# Patient Record
Sex: Female | Born: 1940
Health system: Southern US, Community
[De-identification: ages and names within clinical notes are randomized; demographics above are authoritative.]

## PROBLEM LIST (undated history)

## (undated) DIAGNOSIS — E039 Hypothyroidism, unspecified: Secondary | ICD-10-CM

## (undated) DIAGNOSIS — D649 Anemia, unspecified: Secondary | ICD-10-CM

## (undated) DIAGNOSIS — R21 Rash and other nonspecific skin eruption: Secondary | ICD-10-CM

## (undated) DIAGNOSIS — M069 Rheumatoid arthritis, unspecified: Secondary | ICD-10-CM

## (undated) DIAGNOSIS — R12 Heartburn: Secondary | ICD-10-CM

## (undated) DIAGNOSIS — I10 Essential (primary) hypertension: Secondary | ICD-10-CM

## (undated) DIAGNOSIS — K449 Diaphragmatic hernia without obstruction or gangrene: Secondary | ICD-10-CM

## (undated) DIAGNOSIS — E785 Hyperlipidemia, unspecified: Secondary | ICD-10-CM

## (undated) DIAGNOSIS — G43909 Migraine, unspecified, not intractable, without status migrainosus: Secondary | ICD-10-CM

## (undated) DIAGNOSIS — R7303 Prediabetes: Secondary | ICD-10-CM

## (undated) DIAGNOSIS — G250 Essential tremor: Secondary | ICD-10-CM

## (undated) DIAGNOSIS — Z9109 Other allergy status, other than to drugs and biological substances: Secondary | ICD-10-CM

## (undated) HISTORY — DX: Heartburn: R12

## (undated) HISTORY — DX: Other allergy status, other than to drugs and biological substances: Z91.09

## (undated) HISTORY — DX: Rheumatoid arthritis, unspecified: M06.9

## (undated) HISTORY — DX: Prediabetes: R73.03

## (undated) HISTORY — DX: Hypothyroidism, unspecified: E03.9

## (undated) HISTORY — PX: COLONOSCOPY: SHX174

## (undated) HISTORY — DX: Anemia, unspecified: D64.9

## (undated) HISTORY — DX: Essential tremor: G25.0

## (undated) HISTORY — DX: Hyperlipidemia, unspecified: E78.5

## (undated) HISTORY — DX: Migraine, unspecified, not intractable, without status migrainosus: G43.909

## (undated) HISTORY — PX: CATARACT EXTRACTION, BILATERAL: SHX1313

## (undated) HISTORY — PX: TUBAL LIGATION: SHX77

## (undated) HISTORY — DX: Rash and other nonspecific skin eruption: R21

## (undated) HISTORY — DX: Diaphragmatic hernia without obstruction or gangrene: K44.9

## (undated) HISTORY — DX: Essential (primary) hypertension: I10

## (undated) HISTORY — PX: ABDOMINAL HYSTERECTOMY: SHX81

---

## 1999-03-09 ENCOUNTER — Encounter: Payer: Self-pay | Admitting: Internal Medicine

## 1999-03-09 ENCOUNTER — Ambulatory Visit (HOSPITAL_COMMUNITY): Admission: RE | Admit: 1999-03-09 | Discharge: 1999-03-09 | Payer: Self-pay | Admitting: Internal Medicine

## 2000-08-22 ENCOUNTER — Encounter: Payer: Self-pay | Admitting: Internal Medicine

## 2000-08-22 ENCOUNTER — Ambulatory Visit (HOSPITAL_COMMUNITY): Admission: RE | Admit: 2000-08-22 | Discharge: 2000-08-22 | Payer: Self-pay | Admitting: Internal Medicine

## 2000-11-22 ENCOUNTER — Encounter: Payer: Self-pay | Admitting: Internal Medicine

## 2000-11-22 ENCOUNTER — Ambulatory Visit (HOSPITAL_COMMUNITY): Admission: RE | Admit: 2000-11-22 | Discharge: 2000-11-22 | Payer: Self-pay | Admitting: Internal Medicine

## 2001-10-11 ENCOUNTER — Encounter: Payer: Self-pay | Admitting: Internal Medicine

## 2001-10-11 ENCOUNTER — Ambulatory Visit (HOSPITAL_COMMUNITY): Admission: RE | Admit: 2001-10-11 | Discharge: 2001-10-11 | Payer: Self-pay | Admitting: Internal Medicine

## 2002-06-28 ENCOUNTER — Encounter: Payer: Self-pay | Admitting: Rheumatology

## 2002-06-28 ENCOUNTER — Encounter: Admission: RE | Admit: 2002-06-28 | Discharge: 2002-06-28 | Payer: Self-pay | Admitting: Rheumatology

## 2003-02-13 ENCOUNTER — Ambulatory Visit (HOSPITAL_COMMUNITY): Admission: RE | Admit: 2003-02-13 | Discharge: 2003-02-13 | Payer: Self-pay | Admitting: Internal Medicine

## 2003-02-13 ENCOUNTER — Encounter: Payer: Self-pay | Admitting: Internal Medicine

## 2005-08-16 ENCOUNTER — Ambulatory Visit (HOSPITAL_COMMUNITY): Admission: RE | Admit: 2005-08-16 | Discharge: 2005-08-16 | Payer: Self-pay | Admitting: Internal Medicine

## 2007-02-19 ENCOUNTER — Ambulatory Visit (HOSPITAL_COMMUNITY): Admission: RE | Admit: 2007-02-19 | Discharge: 2007-02-19 | Payer: Self-pay | Admitting: Internal Medicine

## 2007-02-28 ENCOUNTER — Ambulatory Visit (HOSPITAL_COMMUNITY): Admission: RE | Admit: 2007-02-28 | Discharge: 2007-02-28 | Payer: Self-pay | Admitting: Internal Medicine

## 2007-07-24 ENCOUNTER — Ambulatory Visit (HOSPITAL_COMMUNITY): Admission: RE | Admit: 2007-07-24 | Discharge: 2007-07-24 | Payer: Self-pay | Admitting: Internal Medicine

## 2008-11-18 ENCOUNTER — Encounter: Payer: Self-pay | Admitting: Gastroenterology

## 2008-12-15 ENCOUNTER — Ambulatory Visit (HOSPITAL_COMMUNITY): Admission: RE | Admit: 2008-12-15 | Discharge: 2008-12-15 | Payer: Self-pay | Admitting: Internal Medicine

## 2009-03-03 ENCOUNTER — Ambulatory Visit: Payer: Self-pay

## 2009-04-03 ENCOUNTER — Encounter: Payer: Self-pay | Admitting: Internal Medicine

## 2009-09-02 ENCOUNTER — Ambulatory Visit (HOSPITAL_COMMUNITY): Admission: RE | Admit: 2009-09-02 | Discharge: 2009-09-02 | Payer: Self-pay | Admitting: Internal Medicine

## 2010-06-09 ENCOUNTER — Ambulatory Visit (HOSPITAL_COMMUNITY): Admission: RE | Admit: 2010-06-09 | Discharge: 2010-06-09 | Payer: Self-pay | Admitting: Internal Medicine

## 2011-04-28 ENCOUNTER — Ambulatory Visit (INDEPENDENT_AMBULATORY_CARE_PROVIDER_SITE_OTHER): Payer: Medicare Other | Admitting: Gastroenterology

## 2011-04-28 ENCOUNTER — Encounter: Payer: Self-pay | Admitting: Gastroenterology

## 2011-04-28 VITALS — BP 130/66 | HR 56 | Ht 64.0 in | Wt 177.8 lb

## 2011-04-28 DIAGNOSIS — R1013 Epigastric pain: Secondary | ICD-10-CM

## 2011-04-28 MED ORDER — OMEPRAZOLE-SODIUM BICARBONATE 40-1100 MG PO CAPS: ORAL_CAPSULE | ORAL | Status: DC

## 2011-04-28 NOTE — Assessment & Plan Note (Signed)
Symptoms may be due to ulcer or nonulcer dyspepsia. I strongly suspect that she is also having nocturnal GERD.  Recommendations #1 DC omeprazole; begin Zegerid 40 mg each bedtime #2 upper endoscopy

## 2011-04-28 NOTE — Patient Instructions (Addendum)
Discontinue omeprazole   Upper GI Endoscopy Upper GI endoscopy means using a flexible scope to look at the esophagus, stomach and upper small bowel. This is done to make a diagnosis in people with heartburn, abdominal pain, or abnormal bleeding. Sometimes an endoscope is needed to remove foreign bodies or food that become stuck in the esophagus; it can also be used to take biopsy samples. For the best results, do not eat or drink for 8 hours before having your upper endoscopy.  To perform the endoscopy, you will probably be sedated and your throat will be numbed with a special spray. The endoscope is then slowly passed down your throat (this will not interfere with your breathing). An endoscopy exam takes 15-30 minutes to complete and there is no real pain. Patients rarely remember much about the procedure. The results of the test may take several days if a biopsy or other test is taken.  You may have a sore throat after an endoscopy exam. Serious complications are very rare. Stick to liquids and soft foods until your pain is better. You should not drive a car or operate any dangerous equipment for at least 24 hours after being sedated. SEEK IMMEDIATE MEDICAL CARE IF:  You have severe throat pain.   You have shortness of breath.   You have bleeding problems.   You have a fever.   You have difficulty recovering from your sedation.  Document Released: 12/15/2004 Document Re-Released: 02/01/2010 [facility] Patient Information 2011 [place], [place]. Your Endoscopy is scheduled on 05/19/2011 at 4pm

## 2011-04-28 NOTE — Progress Notes (Signed)
History of Present Illness:  Dana [name] is a pleasant 70 year old white female referred at the request of Lucky Cowboy, MD evaluation of abdominal pain. Over the past few  months she has been complaining of intermittent upper epigastric pain that she generally experiences upon retiring. She'll often awaken with pain in the area accompanied by excess belching and pyrosis. Symptoms have not improved since starting omeprazole. She has mild dysphagia to pills only. She is on no gastric irritants including nonsteroidals. Endoscopy in 2003 demonstrated gastritis.     Review of Systems: She has hoarseness that she should be severe essential tremor. She complains of mild dizziness. Pertinent positive and negative review of systems were noted in the above HPI section. All other review of systems were otherwise negative.    Current Medications, Allergies, Past Medical History, Past Surgical History, Family History and Social History were reviewed in [employer] electronic medical record  Vital signs were reviewed in today's medical record. Physical Exam: General: Well developed , well nourished, no acute distress Head: Normocephalic and atraumatic Eyes:  sclerae anicteric, EOMI Ears: Normal auditory acuity Mouth: No deformity or lesions Lungs: Clear throughout to auscultation Heart: Regular rate and rhythm; no murmurs, rubs or bruits Abdomen: Soft, non tender and non distended. No masses, hepatosplenomegaly or hernias noted. Normal Bowel sounds Rectal:deferred Musculoskeletal: Symmetrical with no gross deformities  Pulses:  Normal pulses noted Extremities: No clubbing, cyanosis, edema or deformities noted Neurological: Alert oriented x 4, grossly nonfocal Psychological:  Alert and cooperative. Normal mood and affect

## 2011-04-29 ENCOUNTER — Telehealth: Payer: Self-pay | Admitting: Gastroenterology

## 2011-04-29 NOTE — Telephone Encounter (Signed)
Pt called wanted to know if she could purchase OTC Zegerid until her medication from [facility] comes in. Told her that will be fine

## 2011-05-02 ENCOUNTER — Encounter: Payer: Self-pay | Admitting: Gastroenterology

## 2011-05-19 ENCOUNTER — Other Ambulatory Visit: Payer: Medicare Other | Admitting: Gastroenterology

## 2011-05-19 ENCOUNTER — Ambulatory Visit (AMBULATORY_SURGERY_CENTER): Payer: Medicare Other | Admitting: Gastroenterology

## 2011-05-19 ENCOUNTER — Encounter: Payer: Self-pay | Admitting: Gastroenterology

## 2011-05-19 VITALS — BP 125/50 | HR 55 | Temp 98.5°F | Resp 20 | Ht 64.0 in | Wt 177.0 lb

## 2011-05-19 DIAGNOSIS — K2940 Chronic atrophic gastritis without bleeding: Secondary | ICD-10-CM

## 2011-05-19 DIAGNOSIS — R1013 Epigastric pain: Secondary | ICD-10-CM

## 2011-05-19 DIAGNOSIS — K2990 Gastroduodenitis, unspecified, without bleeding: Secondary | ICD-10-CM

## 2011-05-19 DIAGNOSIS — R109 Unspecified abdominal pain: Secondary | ICD-10-CM

## 2011-05-19 MED ORDER — SODIUM CHLORIDE 0.9 % IV SOLN: 500.0000 mL | INTRAVENOUS | Status: DC

## 2011-05-19 NOTE — Patient Instructions (Signed)
Findings:  Erosions  Recommendations:  Continue Zegerid                                   Call office in next 2-3 days to schedule an office appointment for 4-6 weeks

## 2011-05-20 ENCOUNTER — Telehealth: Payer: Self-pay | Admitting: *Deleted

## 2011-05-20 NOTE — Telephone Encounter (Signed)

## 2011-06-24 ENCOUNTER — Encounter: Payer: Self-pay | Admitting: Gastroenterology

## 2011-06-24 ENCOUNTER — Ambulatory Visit (INDEPENDENT_AMBULATORY_CARE_PROVIDER_SITE_OTHER): Payer: Medicare Other | Admitting: Gastroenterology

## 2011-06-24 DIAGNOSIS — R1013 Epigastric pain: Secondary | ICD-10-CM

## 2011-06-24 DIAGNOSIS — K2970 Gastritis, unspecified, without bleeding: Secondary | ICD-10-CM

## 2011-06-24 DIAGNOSIS — R109 Unspecified abdominal pain: Secondary | ICD-10-CM

## 2011-06-24 MED ORDER — HYOSCYAMINE SULFATE 0.125 MG SL SUBL: 0.2500 mg | SUBLINGUAL_TABLET | SUBLINGUAL | Status: AC | PRN

## 2011-06-24 NOTE — Progress Notes (Signed)
History of Present Illness:  Dana [name] has returned following upper endoscopy for evaluation of abdominal pain. Endoscopy demonstrated several gastric erosions. She continues to have episodic epigastric pain consistent of sharp pain without radiation. It is very often is accompanied by nausea and vomiting. Vomiting does relieve the pain. In between she has felt well.  She takes  tramadol on a regular basis and rarely takes tetracycline for rosacea.  She remains on Zegerid.    Review of Systems: Pertinent positive and negative review of systems were noted in the above HPI section. All other review of systems were otherwise negative.    Current Medications, Allergies, Past Medical History, Past Surgical History, Family History and Social History were reviewed in [employer] electronic medical record  Vital signs were reviewed in today's medical record. Physical Exam: General: Well developed , well nourished, no acute distress Abdomen is without masses, tenderness organomegaly

## 2011-06-24 NOTE — Patient Instructions (Signed)
Your abdominal ultrasound is scheduled at [facility] on 06/28/2011 at 8am you will need to arrive at 7:45am Nothing to eat or drink after midnight We will obtain your labs from Dr Michaelle Birks office

## 2011-06-24 NOTE — Assessment & Plan Note (Addendum)
She continues to have intermittent pain despite therapy with PPIs.  Pain is associated with nausea and slightly relieved with vomiting. I think it is unlikely that this is due to peptic ulcer disease. Chronic cholecystitis, less likely obstruction are other considerations.  Recommendations #1 check previous lab work from her PCP #2 abdominal ultrasound #3 hyomax as needed for pain

## 2011-06-24 NOTE — Assessment & Plan Note (Signed)
Plan to continue Zegerid

## 2011-06-28 ENCOUNTER — Ambulatory Visit (HOSPITAL_COMMUNITY)
Admission: RE | Admit: 2011-06-28 | Discharge: 2011-06-28 | Disposition: A | Discharge status: Home / Self Care | Payer: Medicare Other | Source: Ambulatory Visit | Attending: Gastroenterology | Admitting: Gastroenterology

## 2011-06-28 DIAGNOSIS — K8020 Calculus of gallbladder without cholecystitis without obstruction: Secondary | ICD-10-CM | POA: Insufficient documentation

## 2011-06-28 DIAGNOSIS — R109 Unspecified abdominal pain: Secondary | ICD-10-CM | POA: Insufficient documentation

## 2011-06-29 ENCOUNTER — Telehealth: Payer: Self-pay | Admitting: *Deleted

## 2011-06-29 MED ORDER — GLYCOPYRROLATE 2 MG PO TABS: 2.0000 mg | ORAL_TABLET | Freq: Three times a day (TID) | ORAL | Status: DC

## 2011-06-29 NOTE — Telephone Encounter (Signed)
FYI- Dr Arlyce Dice,  Insurance will not pay for Hyoscyamine, I sent in for her Robinul Forte

## 2011-06-30 ENCOUNTER — Telehealth: Payer: Self-pay

## 2011-06-30 NOTE — Telephone Encounter (Signed)
Pt aware of appt date and time

## 2011-06-30 NOTE — Telephone Encounter (Signed)
Message copied by Michele Mcalpine on Thu Jun 30, 2011  9:22 AM ------      Message from: Melvia Heaps MD D      Created: Wed Jun 29, 2011 12:35 PM       Tell pt that gb stones were sen at ultrasound.      Refer pt to the newest associate at [facility] along with, my office notes.

## 2011-06-30 NOTE — Telephone Encounter (Signed)
Pt scheduled to see Dr. Biagio Quint with [facility] 07/13/11 arrival time 1pm, Appt time 1:30pm. Left message for pt to call back.

## 2011-07-05 ENCOUNTER — Encounter: Payer: Self-pay | Admitting: *Deleted

## 2011-07-05 NOTE — Telephone Encounter (Signed)
ok 

## 2011-07-05 NOTE — Telephone Encounter (Signed)
error 

## 2011-07-13 ENCOUNTER — Ambulatory Visit (INDEPENDENT_AMBULATORY_CARE_PROVIDER_SITE_OTHER): Payer: Medicare Other | Admitting: General Surgery

## 2011-07-13 VITALS — BP 122/76 | HR 66 | Temp 98.2°F | Resp 16 | Ht 64.0 in | Wt 173.4 lb

## 2011-07-13 DIAGNOSIS — Z01818 Encounter for other preprocedural examination: Secondary | ICD-10-CM

## 2011-07-13 DIAGNOSIS — K8020 Calculus of gallbladder without cholecystitis without obstruction: Secondary | ICD-10-CM

## 2011-07-13 NOTE — Progress Notes (Signed)
Chief Complaint  Patient presents with  . Abdominal Pain    Gallstones    HPI Dana [name] is a 70 y.o. female.  This patient was referred by Dr. Arlyce Dice for evaluation of cholelithiasis. She has complaints of 2-3 months of epigastric pain and some occasional right-sided back pain which she describes feels "like a rock in her stomach". She has associated burping and some nausea and vomiting. She states that the symptoms are worse with eating especially spicy foods. She has not noticed any fatty food intolerance. She has episodes approximately every few weeks which began with pain and admits to nausea and vomiting. Her last episode was approximately 2 weeks ago. She has some chills but denies any fevers she denies any hematochezia or melena. She had a normal colonoscopy proximally 8 years ago. She also had a recent EGD which demonstrated some gastric ulcerations but has been treated with omeprazole for history of chronic GERD. She states that despite taking the PPIs her symptoms persist in the medication makes no difference. She had an ultrasound which demonstrated cholelithiasis without evidence of cholecystitis. She does have a history of peptic ulcer disease in the 1980s. HPI  Past Medical History  Diagnosis Date  . Rheumatoid arthritis   . Tremor, essential     right hand  . Environmental allergies   . Hypothyroidism   . Migraines   . Hyperlipidemia   . Heartburn   . Anemia   . Hiatal hernia   . Gastric ulcer   . Cataract     Past Surgical History  Procedure Date  . Abdominal hysterectomy   . Tubal ligation   . Cataract extraction, bilateral     Family History  Problem Relation Age of Onset  . Colon cancer Neg Hx   . Heart disease Mother   . Diabetes Mother   . Heart disease Maternal Aunt     x3   . Heart disease Maternal Uncle     x 4  . Diabetes Sister     borderline    Social History History  Substance Use Topics  . Smoking status: Former Games developer  . Smokeless  tobacco: Never Used   Comment: stopped around 25 years ago  . Alcohol Use: No    Allergies  Allergen Reactions  . Codeine   . Mysoline (Primidone)     Current Outpatient Prescriptions  Medication Sig Dispense Refill  . butalbital-acetaminophen-caffeine (FIORICET) 50-325-40 MG per tablet Take 2 tablets by mouth 2 (two) times daily as needed.        . Cholecalciferol (VITAMIN D) 2000 UNITS CAPS Take by mouth. Take 4 capsules by mouth once daily       . Cyanocobalamin 500 MCG/0.1ML SOLN Inject 1 mL into the skin every 30 (thirty) days.        . diazepam (VALIUM) 2 MG tablet Take 2 mg by mouth every 6 (six) hours as needed.        . doxycycline (VIBRAMYCIN) 100 MG capsule Take 100 mg by mouth 2 (two) times daily.        Marland Kitchen estradiol (ESTRACE) 1 MG tablet Take 1 mg by mouth daily.        . folic acid (FOLVITE) 800 MCG tablet Take 400 mcg by mouth daily.        . furosemide (LASIX) 40 MG tablet Take 40 mg by mouth daily.        . hydroxychloroquine (PLAQUENIL) 200 MG tablet Take 2 tablets by mouth once daily       .  levothyroxine (SYNTHROID, LEVOTHROID) 100 MCG tablet Take 100 mcg by mouth daily.        Marland Kitchen loratadine (CLARITIN) 10 MG tablet Take 10 mg by mouth daily.        . methotrexate (RHEUMATREX) 2.5 MG tablet Take 5 tablets by mouth on Tuesday and 5 tablets by mouth on Wednesday       . omeprazole (PRILOSEC) 20 MG capsule       . omeprazole-sodium bicarbonate (ZEGERID) 40-1100 MG per capsule Take one capsule at bedtime  30 capsule  2  . pravastatin (PRAVACHOL) 40 MG tablet Take 40 mg by mouth daily.        . propranolol (INDERAL LA) 120 MG 24 hr capsule Take 120 mg by mouth daily.        . traMADol (ULTRAM) 50 MG tablet Take 50 mg by mouth 2 (two) times daily as needed.        . verapamil (CALAN-SR) 180 MG CR tablet Take 180 mg by mouth at bedtime.        Marland Kitchen ZOSTAVAX 04540 UNT/0.65ML injection       . estradiol (ESTRACE) 2 MG tablet       . glycopyrrolate (ROBINUL-FORTE) 2 MG tablet  Take 1 tablet (2 mg total) by mouth 3 (three) times daily.  30 tablet  2    Review of Systems Review of Systems  Constitutional: Positive for chills. Negative for fever and weight loss.  HENT: Negative for congestion and sore throat.   Eyes: Positive for blurred vision and double vision. Negative for pain and redness.  Respiratory: Negative.  Negative for stridor.   Cardiovascular: Positive for chest pain. Negative for palpitations and leg swelling.  Gastrointestinal: Positive for nausea, vomiting and abdominal pain. Negative for blood in stool and melena.  Genitourinary: Negative.   Musculoskeletal: Positive for myalgias and back pain.  Skin: Negative.   Neurological: Positive for headaches.  Endo/Heme/Allergies: Negative.   Psychiatric/Behavioral: Negative.     Blood pressure 122/76, pulse 66, temperature 98.2 F (36.8 C), temperature source Temporal, resp. rate 16, height 5\' 4"  (1.626 m), weight 173 lb 6.4 oz (78.654 kg).  Physical Exam Physical Exam  Constitutional: She is oriented to person, place, and time. She appears well-developed and well-nourished. No distress.  HENT:  Head: Normocephalic and atraumatic.  Eyes: Conjunctivae are normal. Pupils are equal, round, and reactive to light. Right eye exhibits no discharge. Left eye exhibits no discharge. No scleral icterus.  Neck: Normal range of motion. Neck supple. No tracheal deviation present.  Cardiovascular: Normal rate, regular rhythm and normal heart sounds.   Respiratory: Effort normal and breath sounds normal. No stridor. No respiratory distress. She has no wheezes.  GI: Soft. Bowel sounds are normal. She exhibits no distension and no mass. There is no tenderness. There is no rebound and no guarding.  Musculoskeletal: Normal range of motion. She exhibits no edema and no tenderness.  Neurological: She is alert and oriented to person, place, and time.  Skin: Skin is warm and dry. No rash noted. She is not diaphoretic. No  erythema. No pallor.  Psychiatric: She has a normal mood and affect. Her behavior is normal. Judgment and thought content normal.     Data Reviewed   Assessment    Epigastric abdominal pain and cholelithiasis. This certainly could be related to her cholelithiasis but she also has a history of peptic ulcer disease. She had a recent endoscopy which showed some gastric ulcerations but she was referred by her  gastroenterologist for cholecystectomy because he feels that this is most likely due to her cholelithiasis. I agree that her symptoms certainly could be related to her gallbladder and I discussed the options for cholecystectomy with her. I also discussed the risk that if we perform cholecystectomy that her greatest risk would be that she has persistence of her symptoms and that this was actually from another cause because her symptoms are not as clear-cut as typical. We discussed the other risks of surgery including infection, bleeding, pain, scarring, persistent symptoms, injury to bowel or bile ducts, diarrhea and she expressed understanding. We will plan to have her see a cardiologist for cardiac clearance in preparation for surgery, and if she desires to proceed with cholecystectomy we will do this at her convenience.    Plan    Cardiology evaluation and clearance  She will come back if she desires to pursue cholecystectomy.       Lodema Pilot DAVID 07/13/2011, 2:26 PM

## 2011-08-22 DIAGNOSIS — R21 Rash and other nonspecific skin eruption: Secondary | ICD-10-CM

## 2011-08-22 HISTORY — DX: Rash and other nonspecific skin eruption: R21

## 2011-08-29 ENCOUNTER — Other Ambulatory Visit (INDEPENDENT_AMBULATORY_CARE_PROVIDER_SITE_OTHER): Payer: Self-pay | Admitting: General Surgery

## 2011-08-29 ENCOUNTER — Encounter (HOSPITAL_COMMUNITY)
Admission: RE | Admit: 2011-08-29 | Discharge: 2011-08-29 | Disposition: A | Discharge status: Home / Self Care | Payer: Medicare Other | Source: Ambulatory Visit | Attending: General Surgery | Admitting: General Surgery

## 2011-08-29 DIAGNOSIS — I10 Essential (primary) hypertension: Secondary | ICD-10-CM

## 2011-08-29 LAB — COMPREHENSIVE METABOLIC PANEL
ALT: 10 U/L (ref 0–35)
AST: 16 U/L (ref 0–37)
Albumin: 3.3 g/dL — ABNORMAL LOW (ref 3.5–5.2)
Alkaline Phosphatase: 90 U/L (ref 39–117)
CO2: 31 mEq/L (ref 19–32)
Chloride: 103 mEq/L (ref 96–112)
GFR calc non Af Amer: 85 mL/min — ABNORMAL LOW (ref >90)
Potassium: 4.2 mEq/L (ref 3.5–5.1)
Sodium: 140 mEq/L (ref 135–145)
Total Bilirubin: 0.3 mg/dL (ref 0.3–1.2)

## 2011-08-29 LAB — CBC
MCV: 93.5 fL (ref 78.0–100.0)
Platelets: 293 10*3/uL (ref 150–400)
RBC: 3.83 MIL/uL — ABNORMAL LOW (ref 3.87–5.11)
RDW: 14.7 % (ref 11.5–15.5)
WBC: 7.3 10*3/uL (ref 4.0–10.5)

## 2011-08-29 LAB — DIFFERENTIAL
Basophils Absolute: 0 10*3/uL (ref 0.0–0.1)
Eosinophils Absolute: 0.1 10*3/uL (ref 0.0–0.7)
Eosinophils Relative: 2 % (ref 0–5)
Lymphs Abs: 2.3 10*3/uL (ref 0.7–4.0)
Neutrophils Relative %: 57 % (ref 43–77)

## 2011-08-29 LAB — SURGICAL PCR SCREEN: Staphylococcus aureus: NEGATIVE

## 2011-08-31 ENCOUNTER — Ambulatory Visit (HOSPITAL_COMMUNITY): Payer: Medicare Other

## 2011-08-31 ENCOUNTER — Ambulatory Visit (HOSPITAL_COMMUNITY)
Admission: RE | Admit: 2011-08-31 | Discharge: 2011-08-31 | Disposition: A | Discharge status: Home / Self Care | Payer: Medicare Other | Source: Ambulatory Visit | Attending: General Surgery | Admitting: General Surgery

## 2011-08-31 ENCOUNTER — Other Ambulatory Visit (INDEPENDENT_AMBULATORY_CARE_PROVIDER_SITE_OTHER): Payer: Self-pay | Admitting: General Surgery

## 2011-08-31 DIAGNOSIS — E039 Hypothyroidism, unspecified: Secondary | ICD-10-CM | POA: Insufficient documentation

## 2011-08-31 DIAGNOSIS — M129 Arthropathy, unspecified: Secondary | ICD-10-CM | POA: Insufficient documentation

## 2011-08-31 DIAGNOSIS — K8020 Calculus of gallbladder without cholecystitis without obstruction: Secondary | ICD-10-CM | POA: Insufficient documentation

## 2011-08-31 DIAGNOSIS — Z01812 Encounter for preprocedural laboratory examination: Secondary | ICD-10-CM | POA: Insufficient documentation

## 2011-08-31 DIAGNOSIS — Z87891 Personal history of nicotine dependence: Secondary | ICD-10-CM | POA: Insufficient documentation

## 2011-08-31 DIAGNOSIS — K811 Chronic cholecystitis: Secondary | ICD-10-CM

## 2011-08-31 DIAGNOSIS — K219 Gastro-esophageal reflux disease without esophagitis: Secondary | ICD-10-CM | POA: Insufficient documentation

## 2011-08-31 DIAGNOSIS — Z01818 Encounter for other preprocedural examination: Secondary | ICD-10-CM | POA: Insufficient documentation

## 2011-08-31 DIAGNOSIS — R7309 Other abnormal glucose: Secondary | ICD-10-CM | POA: Insufficient documentation

## 2011-08-31 HISTORY — PX: CHOLECYSTECTOMY: SHX55

## 2011-09-02 NOTE — Op Note (Signed)
Dana [name], Dana [name] NO.:  [id number]  MEDICAL RECORD NO.:  [id number]  LOCATION:  [facility]                         FACILITY:  [facility]  PHYSICIAN:  Lodema Pilot, MD       DATE OF BIRTH:  [birth year]  DATE OF PROCEDURE:  08/31/2011 DATE OF DISCHARGE:                              OPERATIVE REPORT   PROCEDURE:  Laparoscopic cholecystectomy with intraoperative cholangiogram.  PREOPERATIVE DIAGNOSIS:  Symptomatic cholelithiasis.  POSTOPERATIVE DIAGNOSIS:  Symptomatic cholelithiasis.  SURGEON:  Lodema Pilot, MD  ASSISTANT:  None.  ANESTHESIA:  General endotracheal anesthesia with 17 mL of 1% lidocaine with epinephrine and 0.25% Marcaine in a 50:50 mixture.  FLUIDS:  1400 mL of crystalloid.  ESTIMATED BLOOD LOSS:  Minimal.  DRAINS:  None.  SPECIMENS:  Gallbladder and contents sent to Pathology for permanent sectioning.  COMPLICATIONS:  None apparent.  FINDINGS:  Normal intraoperative cholangiogram.  Some adhesions to the hemicolon to the lateral abdominal wall, which were taken down with sharp dissection.  She also had a few adhesions from the liver to the abdominal wall, which was taken down with sharp dissection.  INDICATION FOR PROCEDURE:  Dana [name] is a 70 year old female with upper abdominal discomfort after eating and she has gallstones on ultrasound. She has been followed by Dr. Arlyce Dice and she was referred by him for evaluation for cholecystectomy.  She also has a history of peptic ulcer disease, but she has been on PPIs and has persistent symptoms despite PPI treatment.  I discussed with her preoperatively the risks of the procedure, including the main risk for persistent symptoms postoperatively and diarrhea, and she expressed understanding and desired to proceed with planned procedure.  OPERATIVE DETAILS:  Dana [name] was seen and evaluated in the preoperative area and risks and benefits of the procedure were discussed in lay terms.  Informed  consent was obtained and prophylactic antibiotics were given.  She was taken to the operating room, placed on the table in a supine position, and general endotracheal anesthesia was obtained.  Her abdomen was prepped and draped in a standard surgical fashion and a semicircular infraumbilical incision was made in the skin and dissection carried down to subcutaneous tissue with blunt dissection.  The abdominal wall fascia was elevated and sharply incised and the peritoneum was entered under direct visualization.  Interrupted 0-Vicryl sutures were placed at the umbilicus and a 12-mm Hasson port was placed.  Pneumoperitoneum was obtained and the laparoscope was introduced and there was no evidence of bowel injury upon entry.  She had normal-appearing anatomy, although she did have some adhesions from the liver to the anterior abdominal wall, and colon was adhered to the lateral abdominal wall.  An 11-mm epigastric port was placed under direct visualization and the adhesions from the colon to the lateral abdominal wall were taken down with sharp dissection.  There were thin filmy adhesions and the colon was not closely adherent so placement of a 5-mm trocar in this area under direct visualization and another right upper quadrant 5-mm trocar was placed under direct visualization.  The adhesions from the liver to the abdominal wall were taken down with sharp dissection as well, and the  gallbladder fundus was retracted cephalad.  The peritoneum was taken down off the gallbladder and the triangle of Calot was skeletonized.  The artery was identified in its usual anatomic position coursing along the medial aspect of the gallbladder adjacent to the lymph node of Calot.  Clips were placed on the artery, but it was not divided at this time.  The cystic duct was skeletonized and the triangle of Calot was opened up and the critical view of safety was obtained visualizing the liver and parenchyma  through the triangle of Calot with a single cystic duct coursing on the gallbladder and a single cystic artery coursing through the triangle of Calot.  Clip was placed on the gallbladder and a small cystic ductotomy was made and cholangiogram was performed, which demonstrated a long cystic duct and normal filling of the common bile duct and right and left hepatic ducts.  There were no filling defects, and there was free flow of contrast in the duodenum.  The catheter was removed and clips were placed on the cystic duct and the cystic duct was transected and the already clipped.  Cystic artery was transected at this time.  She had a long mesentery on the gallbladder and the gallbladder was taken off the gallbladder fossa using Bovie electrocautery and the gallbladder was not entered during the dissection.  There was no spillage of bile during the dissection and the gallbladder was completely removed and placed in an EndoCatch bag and removed through the umbilicus and passed off the table and sent to Pathology for permanent sectioning.  The gallbladder fossa was then irrigated and inspected for hemostasis. Hemostasis was obtained with Bovie electrocautery and clips were again noted in good position.  The right upper quadrant was suctioned from any fluid and the right upper quadrant trocar was removed under direct visualization.  The abdominal wall was noted to be hemostatic and then the additional sutures were placed to close the umbilical fascia with interrupted 0-Vicryl sutures.  These sutures were secured and the abdomen was reinsufflated through the epigastric trocar and the laparoscope introduced.  The umbilical closure was noted be adequate, there was no evidence of bowel injury, and the right upper quadrant appeared hemostatic with no evidence of bowel injury.  The epigastric trocar was removed and the skin was washed and dried, and a total of 17 mL of local were injected at the port  sites and the skin edges were approximated with 4-0 Monocryl subcuticular suture.  Skin was washed and dried and Dermabond was applied.  All sponge, needle, and instrument counts were correct at the end of the case.  The patient tolerated the procedure well without apparent complications.          ______________________________ Lodema Pilot, MD     BL/MEDQ  D:  08/31/2011  T:  08/31/2011  Job:  [id number]  Electronically Signed by Lodema Pilot DO on 09/02/2011 12:15:52 AM

## 2011-09-07 ENCOUNTER — Encounter (INDEPENDENT_AMBULATORY_CARE_PROVIDER_SITE_OTHER): Payer: Self-pay | Admitting: General Surgery

## 2011-09-07 ENCOUNTER — Ambulatory Visit (INDEPENDENT_AMBULATORY_CARE_PROVIDER_SITE_OTHER): Payer: Medicare Other | Admitting: General Surgery

## 2011-09-07 VITALS — BP 124/68 | HR 60 | Temp 96.3°F | Resp 16 | Ht 64.0 in | Wt 166.4 lb

## 2011-09-07 DIAGNOSIS — Z5189 Encounter for other specified aftercare: Secondary | ICD-10-CM

## 2011-09-07 DIAGNOSIS — Z4889 Encounter for other specified surgical aftercare: Secondary | ICD-10-CM

## 2011-09-07 DIAGNOSIS — R21 Rash and other nonspecific skin eruption: Secondary | ICD-10-CM

## 2011-09-07 NOTE — Progress Notes (Signed)
Subjective:     Patient ID: Dana [name], female   DOB: 05/24/1941, 70 y.o.   MRN: [id number]  HPI This patient follows up approximately one week status post laparoscopic cholecystectomy. She comes in today early for her postoperative visit due to a rash that has come up on her upper abdomen and breast region since Saturday. This first started approximately 2-3 days after surgery but has been asymptomatic. She's not had any fevers and she is off pain medication stating that her pain continues to improve daily. She is tolerating diet and her bowels are functioning. She denies any itching.  Review of Systems     Objective:   Physical Exam No acute distress and nontoxic-appearing sitting correctly in the bed  Her abdomen is soft and nontender on exam her incisions are healing well without sign of infection. She has a bandlike rash across her upper abdomen and lower breast region with some extension into the crease of her right breast this is maculopapular with some small pustules.    Assessment:     Status post cholecystectomy doing well her pathology benign  Maculopapular rash which is likely due to an allergic contact dermatitis from adhesives. This is in a bandlike distribution across her chestwhere the [facility] was present. This is currently asymptomatic and nonpainful and not itchy. At the current time I recommended continued observation and topical medications if this becomes symptomatic however I think this should improve with time. If this does not improve shortly, we will get her in to see her dermatologist Dr. Margo Aye.    Plan:

## 2011-09-29 ENCOUNTER — Encounter (INDEPENDENT_AMBULATORY_CARE_PROVIDER_SITE_OTHER): Payer: Medicare Other | Admitting: General Surgery

## 2011-10-04 ENCOUNTER — Encounter (INDEPENDENT_AMBULATORY_CARE_PROVIDER_SITE_OTHER): Payer: Self-pay | Admitting: General Surgery

## 2012-08-13 ENCOUNTER — Encounter: Payer: Self-pay | Admitting: Gastroenterology

## 2012-09-18 ENCOUNTER — Ambulatory Visit (AMBULATORY_SURGERY_CENTER): Payer: Medicare Other

## 2012-09-18 VITALS — Ht 63.5 in | Wt 163.5 lb

## 2012-09-18 DIAGNOSIS — Z1211 Encounter for screening for malignant neoplasm of colon: Secondary | ICD-10-CM

## 2012-09-18 MED ORDER — NA SULFATE-K SULFATE-MG SULF 17.5-3.13-1.6 GM/177ML PO SOLN: 1.0000 | Freq: Once | ORAL | Status: DC

## 2012-10-02 ENCOUNTER — Encounter: Payer: Self-pay | Admitting: Gastroenterology

## 2012-10-02 ENCOUNTER — Ambulatory Visit (AMBULATORY_SURGERY_CENTER): Payer: Medicare Other | Admitting: Gastroenterology

## 2012-10-02 VITALS — BP 131/69 | HR 55 | Temp 97.0°F | Resp 20 | Ht 63.5 in | Wt 163.0 lb

## 2012-10-02 DIAGNOSIS — Z1211 Encounter for screening for malignant neoplasm of colon: Secondary | ICD-10-CM

## 2012-10-02 DIAGNOSIS — K5730 Diverticulosis of large intestine without perforation or abscess without bleeding: Secondary | ICD-10-CM

## 2012-10-02 MED ORDER — SODIUM CHLORIDE 0.9 % IV SOLN: 500.0000 mL | INTRAVENOUS | Status: DC

## 2012-10-02 NOTE — Progress Notes (Signed)
Pressure applied to the abdomen to reach the cecum 

## 2012-10-02 NOTE — Progress Notes (Signed)
Patient did not experience any of the following events: a burn prior to discharge; a fall within the facility; wrong site/side/patient/procedure/implant event; or a hospital transfer or hospital admission upon discharge from the facility. (G8907) Patient did not have preoperative order for IV antibiotic SSI prophylaxis. (G8918)  

## 2012-10-02 NOTE — Progress Notes (Signed)
1035 Unable to expel air.  See meds/Levsin given.

## 2012-10-02 NOTE — Op Note (Signed)
[facility] [address] [place], [address]   COLONOSCOPY PROCEDURE REPORT  PATIENT: Dana [name], Dana [name]  MR#: [id number] BIRTHDATE: [birth year] , 71  yrs. old GENDER: Female ENDOSCOPIST: Louis Meckel, MD REFERRED [id number] Oneta Rack, M.D. PROCEDURE DATE:  10/02/2012 PROCEDURE:   Colonoscopy, diagnostic ASA CLASS:   Class II INDICATIONS: MEDICATIONS: MAC sedation, administered by CRNA and propofol (Diprivan) 200mg  IV  DESCRIPTION OF PROCEDURE:   After the risks benefits and alternatives of the procedure were thoroughly explained, informed consent was obtained.  A digital rectal exam revealed no abnormalities of the rectum.   The LB [id number]  endoscope was introduced through the anus and advanced to the cecum, which was identified by both the appendix and ileocecal valve. No adverse events experienced.   The quality of the prep was Suprep excellent The instrument was then slowly withdrawn as the colon was fully examined.      COLON FINDINGS: Mild diverticulosis was noted in the sigmoid colon. Retroflexed views revealed no abnormalities. The time to cecum=5 minutes 0 seconds.  Withdrawal time=6 minutes 56 seconds.  The scope was withdrawn and the procedure completed. COMPLICATIONS: There were no complications.  ENDOSCOPIC IMPRESSION: Mild diverticulosis was noted in the sigmoid colon  RECOMMENDATIONS: Continue current colorectal screening recommendations for "routine risk" patients with a repeat colonoscopy in 10 years.   eSigned:  Louis Meckel, MD 10/02/2012 10:12 AM   cc:

## 2012-10-02 NOTE — Patient Instructions (Addendum)
YOU HAD AN ENDOSCOPIC PROCEDURE TODAY AT THE [facility]: Refer to the procedure report that was given to you for any specific questions about what was found during the examination.  If the procedure report does not answer your questions, please call your gastroenterologist to clarify.  If you requested that your care partner not be given the details of your procedure findings, then the procedure report has been included in a sealed envelope for you to review at your convenience later.  YOU SHOULD EXPECT: Some feelings of bloating in the abdomen. Passage of more gas than usual.  Walking can help get rid of the air that was put into your GI tract during the procedure and reduce the bloating. If you had a lower endoscopy (such as a colonoscopy or flexible sigmoidoscopy) you may notice spotting of blood in your stool or on the toilet paper. If you underwent a bowel prep for your procedure, then you may not have a normal bowel movement for a few days.  DIET: Your first meal following the procedure should be a light meal and then it is ok to progress to your normal diet.  A half-sandwich or bowl of soup is an example of a good first meal.  Heavy or fried foods are harder to digest and may make you feel nauseous or bloated.  Likewise meals heavy in dairy and vegetables can cause extra gas to form and this can also increase the bloating.  Drink plenty of fluids but you should avoid alcoholic beverages for 24 hours.  ACTIVITY: Your care partner should take you home directly after the procedure.  You should plan to take it easy, moving slowly for the rest of the day.  You can resume normal activity the day after the procedure however you should NOT DRIVE or use heavy machinery for 24 hours (because of the sedation medicines used during the test).    SYMPTOMS TO REPORT IMMEDIATELY: A gastroenterologist can be reached at any hour.  During normal business hours, 8:30 AM to 5:00 PM Monday through Friday,  call [phone].  After hours and on weekends, please call the GI answering service at [phone] who will take a message and have the physician on call contact you.   Following lower endoscopy (colonoscopy or flexible sigmoidoscopy):  Excessive amounts of blood in the stool  Significant tenderness or worsening of abdominal pains  Swelling of the abdomen that is new, acute  Fever of 100F or higher  Following upper endoscopy (EGD)  Vomiting of blood or coffee ground material  New chest pain or pain under the shoulder blades  Painful or persistently difficult swallowing  New shortness of breath  Fever of 100F or higher  Black, tarry-looking stools  FOLLOW UP: If any biopsies were taken you will be contacted by phone or by letter within the next 1-3 weeks.  Call your gastroenterologist if you have not heard about the biopsies in 3 weeks.  Our staff will call the home number listed on your records the next business day following your procedure to check on you and address any questions or concerns that you may have at that time regarding the information given to you following your procedure. This is a courtesy call and so if there is no answer at the home number and we have not heard from you through the emergency physician on call, we will assume that you have returned to your regular daily activities without incident.  SIGNATURES/CONFIDENTIALITY: You and/or your care   partner have signed paperwork which will be entered into your electronic medical record.  These signatures attest to the fact that that the information above on your After Visit Summary has been reviewed and is understood.  Full responsibility of the confidentiality of this discharge information lies with you and/or your care-partner.  

## 2012-10-03 ENCOUNTER — Telehealth: Payer: Self-pay | Admitting: *Deleted

## 2012-10-03 NOTE — Telephone Encounter (Signed)
  Follow up Call-  Call back number 10/02/2012 05/19/2011  Post procedure Call Back phone  # [phone]  Permission to leave phone message Yes -     Patient questions:  Do you have a fever, pain , or abdominal swelling? no Pain Score  0 *  Have you tolerated food without any problems? yes  Have you been able to return to your normal activities? yes  Do you have any questions about your discharge instructions: Diet   no Medications  no Follow up visit  no  Do you have questions or concerns about your Care? no  Actions: * If pain score is 4 or above: No action needed, pain <4.

## 2013-02-14 ENCOUNTER — Other Ambulatory Visit (HOSPITAL_COMMUNITY): Payer: Self-pay | Admitting: Internal Medicine

## 2013-02-14 DIAGNOSIS — Z780 Asymptomatic menopausal state: Secondary | ICD-10-CM

## 2013-02-14 DIAGNOSIS — Z1231 Encounter for screening mammogram for malignant neoplasm of breast: Secondary | ICD-10-CM

## 2013-02-20 ENCOUNTER — Ambulatory Visit (HOSPITAL_COMMUNITY)
Admission: RE | Admit: 2013-02-20 | Discharge: 2013-02-20 | Disposition: A | Discharge status: Home / Self Care | Payer: Medicare Other | Source: Ambulatory Visit | Attending: Internal Medicine | Admitting: Internal Medicine

## 2013-02-20 DIAGNOSIS — Z1231 Encounter for screening mammogram for malignant neoplasm of breast: Secondary | ICD-10-CM

## 2013-02-20 DIAGNOSIS — Z13820 Encounter for screening for osteoporosis: Secondary | ICD-10-CM | POA: Insufficient documentation

## 2013-02-20 DIAGNOSIS — Z780 Asymptomatic menopausal state: Secondary | ICD-10-CM

## 2013-07-09 ENCOUNTER — Other Ambulatory Visit: Payer: Self-pay | Admitting: Internal Medicine

## 2013-09-12 ENCOUNTER — Ambulatory Visit (HOSPITAL_COMMUNITY)
Admission: RE | Admit: 2013-09-12 | Discharge: 2013-09-12 | Disposition: A | Discharge status: Home / Self Care | Payer: Medicare Other | Source: Ambulatory Visit | Attending: Emergency Medicine | Admitting: Emergency Medicine

## 2013-09-12 ENCOUNTER — Other Ambulatory Visit (HOSPITAL_COMMUNITY): Payer: Self-pay | Admitting: Emergency Medicine

## 2013-09-12 DIAGNOSIS — M4120 Other idiopathic scoliosis, site unspecified: Secondary | ICD-10-CM | POA: Insufficient documentation

## 2013-09-12 DIAGNOSIS — R071 Chest pain on breathing: Secondary | ICD-10-CM

## 2013-09-12 DIAGNOSIS — R079 Chest pain, unspecified: Secondary | ICD-10-CM | POA: Insufficient documentation

## 2013-10-07 ENCOUNTER — Other Ambulatory Visit: Payer: Self-pay | Admitting: Emergency Medicine

## 2013-10-07 MED ORDER — LEVOTHYROXINE SODIUM 100 MCG PO TABS: 100.0000 ug | ORAL_TABLET | Freq: Every day | ORAL | Status: DC

## 2013-10-10 ENCOUNTER — Other Ambulatory Visit: Payer: Self-pay

## 2013-12-31 ENCOUNTER — Encounter: Payer: Self-pay | Admitting: Cardiology

## 2013-12-31 DIAGNOSIS — E782 Mixed hyperlipidemia: Secondary | ICD-10-CM | POA: Insufficient documentation

## 2013-12-31 DIAGNOSIS — E039 Hypothyroidism, unspecified: Secondary | ICD-10-CM | POA: Insufficient documentation

## 2013-12-31 DIAGNOSIS — G43909 Migraine, unspecified, not intractable, without status migrainosus: Secondary | ICD-10-CM | POA: Insufficient documentation

## 2013-12-31 DIAGNOSIS — G250 Essential tremor: Secondary | ICD-10-CM | POA: Insufficient documentation

## 2013-12-31 DIAGNOSIS — D649 Anemia, unspecified: Secondary | ICD-10-CM | POA: Insufficient documentation

## 2013-12-31 DIAGNOSIS — M069 Rheumatoid arthritis, unspecified: Secondary | ICD-10-CM | POA: Insufficient documentation

## 2014-01-05 DIAGNOSIS — I10 Essential (primary) hypertension: Secondary | ICD-10-CM | POA: Insufficient documentation

## 2014-01-05 DIAGNOSIS — R7309 Other abnormal glucose: Secondary | ICD-10-CM | POA: Insufficient documentation

## 2014-01-06 DIAGNOSIS — E559 Vitamin D deficiency, unspecified: Secondary | ICD-10-CM | POA: Insufficient documentation

## 2014-01-06 DIAGNOSIS — Z79899 Other long term (current) drug therapy: Secondary | ICD-10-CM | POA: Insufficient documentation

## 2014-01-06 NOTE — Progress Notes (Deleted)
Patient ID: Dana [name], female   DOB: [birth year], 73 y.o.   MRN: [id number]

## 2014-01-07 ENCOUNTER — Encounter: Payer: Self-pay | Admitting: Internal Medicine

## 2014-01-08 NOTE — Progress Notes (Signed)
Error

## 2014-01-10 NOTE — Progress Notes (Signed)
Patient ID: Dana [name], female   DOB: 1941/09/19, 73 y.o.   MRN: [id number]   Annual Screening Comprehensive Examination  This very nice 73 y.o. WWF presents for complete physical.  Patient has been followed for HTN,  Prediabetes, Hyperlipidemia, Rheumatoid Arthritis an DJD, Essential Tremor, Hypothyroidism and Vitamin D Deficiency.    HTN predates since  1998. Patient's BP has been controlled at home. Today's BP: 142/78 mmHg. She had a Negative Cardiolyte in 12/2010.Patient denies any cardiac symptoms as chest pain, palpitations, shortness of breath, dizziness or ankle swelling.   Patient's hyperlipidemia is controlled with diet and medications. Patient denies myalgias or other medication SE's. Last cholesterol last visit was 159, triglycerides 89, HDL 77 and LDL - all at goal. Also, she has Essential Tremor which is significantly improved on Diazepam.    Patient has prediabetes with A1c 5.7% in 2012 and last A1c was 5.5% in Aug 2014. Patient denies reactive hypoglycemic symptoms, visual blurring, diabetic polys, or paresthesias.    Patient is on MTX and Plaquenil for  Rheumatoid Arthritis and is followed at [facility] by Dr Geraldo Docker (fax [phone]).     Finally, patient has history of Vitamin D Deficiency of 17 in 2009 with last vitamin D 55 in Aug 2014.    Medication List       Cyanocobalamin 500 MCG/0.1ML Soln  Place 1 mL under the tongue daily.     diazepam 2 MG tablet  Commonly known as:  VALIUM  Take 2 mg by mouth at bedtime.     doxycycline 100 MG capsule  Commonly known as:  VIBRAMYCIN  Take 100 mg by mouth as needed for Rosacea     estradiol 1 MG tablet  Commonly known as:  ESTRACE  Take 1 mg by mouth daily.     FIORICET 50-325-40 MG per tablet  Generic drug:  butalbital-acetaminophen-caffeine  Take 2 tablets by mouth 2 (two) times daily as needed.     folic acid 115 MCG tablet  Commonly known as:  FOLVITE  Take 800 mcg by mouth daily.     furosemide 40 MG tablet  Commonly  known as:  LASIX  Take 40 mg by mouth daily.     hydroxychloroquine 200 MG tablet  Commonly known as:  PLAQUENIL  Take 2 tablets by mouth once daily     levothyroxine 100 MCG tablet  Commonly known as:  SYNTHROID, LEVOTHROID  Take 1 tablet (100 mcg total) by mouth daily.     loratadine 10 MG tablet  Commonly known as:  CLARITIN  Take 10 mg by mouth daily.     methotrexate 2.5 MG tablet  Commonly known as:  RHEUMATREX  Take 5 tablets by mouth on Tuesday and 5 tablets by mouth on Wednesday     omeprazole 20 MG capsule  Commonly known as:  PRILOSEC  20 mg 2 (two) times daily.     pravastatin 40 MG tablet  Commonly known as:  PRAVACHOL  Take 40 mg by mouth daily.     propranolol ER 120 MG 24 hr capsule  Commonly known as:  INDERAL LA  Take 120 mg by mouth daily.     traMADol 50 MG tablet  Commonly known as:  ULTRAM  Take 50 mg by mouth at bedtime.     verapamil 180 MG CR tablet  Commonly known as:  CALAN-SR  Take 180 mg by mouth at bedtime.     Vitamin D 2000 UNITS Caps  Take by mouth. Take 4 capsules  by mouth once daily        Allergies  Allergen Reactions  . Codeine Nausea Only  . Mysoline [Primidone] Nausea Only and Other (See Comments)    Over-sedated    Past Medical History  Diagnosis Date  . Rheumatoid arthritis(714.0)   . Tremor, essential     right hand  . Environmental allergies   . Hypothyroidism   . Migraines   . Hyperlipidemia   . Heartburn   . Anemia   . Hiatal hernia   . Gastric ulcer   . Cataract   . Hypertension   . Prediabetes     Past Surgical History  Procedure Laterality Date  . Abdominal hysterectomy    . Tubal ligation    . Cataract extraction, bilateral    . Cholecystectomy  08/31/11    lap chole   . Colonoscopy      Family History  Problem Relation Age of Onset  . Colon cancer Neg Hx   . Heart disease Mother   . Diabetes Mother   . Heart disease Maternal Aunt     x3   . Heart disease Maternal Uncle     x 4  .  Diabetes Sister     borderline  . Heart disease Father     History  Substance Use Topics  . Smoking status: Former Smoker    Types: Cigarettes    Quit date: 09/18/1984  . Smokeless tobacco: Never Used     Comment: stopped around 25 years ago  . Alcohol Use: No    ROS Constitutional: Denies fever, chills, weight loss/gain, headaches, insomnia, fatigue, night sweats, and change in appetite. C/o sl tender lump around her left later mandible. Eyes: Denies redness, blurred vision, diplopia, discharge, itchy, watery eyes.Dr Albina Billet annually for Plaquenil.  ENT: Denies discharge, congestion, post nasal drip, epistaxis, sore throat, earache, hearing loss, dental pain, Tinnitus, Vertigo, Sinus pain, snoring.  Cardio: Denies chest pain, palpitations, irregular heartbeat, syncope, dyspnea, diaphoresis, orthopnea, PND, claudication, edema Respiratory: denies cough, dyspnea, DOE, pleurisy, hoarseness, laryngitis, wheezing.  Gastrointestinal: Denies dysphagia, heartburn, reflux, water brash, pain, cramps, nausea, vomiting, bloating, diarrhea, constipation, hematemesis, melena,  jaundice, hemorrhoids, but does report recent intermittent BRRB. Genitourinary: Denies dysuria, frequency, urgency, nocturia, hesitancy, discharge, hematuria, flank pain Breast:Breast lumps, nipple discharge, bleeding.  Musculoskeletal: C/o pain in all joints as well as spine . Particularly c/o pains in the Lt hip and knee. Skin: Denies puritis, rash, hives, warts, acne, eczema, changing in skin lesion Neuro: No weakness, tremor, incoordination, spasms, paresthesia, pain Psychiatric: Denies confusion, memory loss, sensory loss Endocrine: Denies change in weight, skin, hair change, nocturia, and paresthesia, diabetic polys, visual blurring, hyper / hypo glycemic episodes.  Heme/Lymph: No excessive bleeding, bruising, enlarged lymph nodes.  BP: 142/78  Pulse: 60  Temp: 97.3 F (36.3 C)  Resp: 16    Estimated body mass  index is 28.93 kg/(m^2) as calculated from the following:   Height as of this encounter: 5\' 4"  (1.626 m).   Weight as of this encounter: 168 lb 9.6 oz (76.476 kg).  Physical Exam General Appearance: Well nourished, in no apparent distress. Eyes: PERRLA, EOMs, conjunctiva no swelling or erythema, normal fundi and vessels. Sinuses: No frontal/maxillary tenderness ENT/Mouth: EACs patent / TMs  nl. Nares clear without erythema, swelling, mucoid exudates. Oral hygiene is good. No erythema, swelling, or exudate. Tongue normal, non-obstructing. Tonsils not swollen or erythematous. Hearing normal. Vague sl tender prominance of the Lt lateral mandible which is not diascrete  or mobile. Neck: Supple, thyroid normal. No bruits, nodes or JVD. Respiratory: Respiratory effort normal.  BS equal and clear bilateral without rales, rhonci, wheezing or stridor. Cardio: Heart sounds are normal with regular rate and rhythm and no murmurs, rubs or gallops. Peripheral pulses are normal and equal bilaterally without edema. No aortic or femoral bruits. Chest: symmetric with normal excursions and percussion. Breasts: Symmetric, without lumps, nipple discharge, retractions, or fibrocystic changes.  Abdomen: Flat, soft, with bowl sounds. Nontender, no guarding, rebound, hernias, masses, or organomegaly.  Lymphatics: Non tender without lymphadenopathy.  Musculoskeletal: Full ROM all peripheral extremities, joint stability, 5/5 strength, and normal gait. No joint deformities. Skin: Warm and dry without rashes, lesions, cyanosis, clubbing or  ecchymosis. Flushed facies of rosacea. Neuro: Cranial nerves intact, reflexes equal bilaterally. Normal muscle tone, no cerebellar symptoms. Sensation intact. Fine tremor of hands and mild titubation of the head. Pysch: Awake and oriented X 3, normal affect, Insight and Judgment appropriate.   Assessment and Plan  1. Annual Screening Examination 2. Hypertension  3. Hyperlipidemia 4.  Pre Diabetes 5. Vitamin D Deficiency 6.Rheumatoid Arthritis 7. Essential Tremor 8. Hypothyroidism 9. Lt Hip/Knee pains - refer back to Dr Apolonio Schneiders 10. Rectal Bleeding - Refer back to Dr Doretha Sou ? Needs anoscope 11. Query boney abnormality of Lt Mandible- check X-Ray  Continue prudent diet as discussed, weight control, BP monitoring, regular exercise, and medications. Discussed med's effects and SE's. Screening labs and tests as requested with regular follow-up as recommended.

## 2014-01-10 NOTE — Patient Instructions (Signed)

## 2014-01-13 ENCOUNTER — Encounter: Payer: Self-pay | Admitting: Internal Medicine

## 2014-01-13 ENCOUNTER — Ambulatory Visit (INDEPENDENT_AMBULATORY_CARE_PROVIDER_SITE_OTHER): Payer: Commercial Managed Care - HMO | Admitting: Internal Medicine

## 2014-01-13 VITALS — BP 142/78 | HR 60 | Temp 97.3°F | Resp 16 | Ht 64.0 in | Wt 168.6 lb

## 2014-01-13 DIAGNOSIS — K921 Melena: Secondary | ICD-10-CM

## 2014-01-13 DIAGNOSIS — E785 Hyperlipidemia, unspecified: Secondary | ICD-10-CM

## 2014-01-13 DIAGNOSIS — E559 Vitamin D deficiency, unspecified: Secondary | ICD-10-CM

## 2014-01-13 DIAGNOSIS — R7303 Prediabetes: Secondary | ICD-10-CM

## 2014-01-13 DIAGNOSIS — Z79899 Other long term (current) drug therapy: Secondary | ICD-10-CM

## 2014-01-13 DIAGNOSIS — Z0000 Encounter for general adult medical examination without abnormal findings: Secondary | ICD-10-CM

## 2014-01-13 DIAGNOSIS — M1990 Unspecified osteoarthritis, unspecified site: Secondary | ICD-10-CM

## 2014-01-13 DIAGNOSIS — Z1212 Encounter for screening for malignant neoplasm of rectum: Secondary | ICD-10-CM

## 2014-01-13 DIAGNOSIS — I10 Essential (primary) hypertension: Secondary | ICD-10-CM

## 2014-01-13 DIAGNOSIS — R6884 Jaw pain: Secondary | ICD-10-CM

## 2014-01-13 DIAGNOSIS — G250 Essential tremor: Secondary | ICD-10-CM

## 2014-01-14 LAB — BASIC METABOLIC PANEL WITH GFR
BUN: 12 mg/dL (ref 6–23)
CALCIUM: 9.1 mg/dL (ref 8.4–10.5)
CO2: 32 meq/L (ref 19–32)
CREATININE: 0.85 mg/dL (ref 0.50–1.10)
Chloride: 102 mEq/L (ref 96–112)
GFR, EST AFRICAN AMERICAN: 78 mL/min
GFR, Est Non African American: 68 mL/min
Glucose, Bld: 86 mg/dL (ref 70–99)
Potassium: 4 mEq/L (ref 3.5–5.3)
SODIUM: 143 meq/L (ref 135–145)

## 2014-01-14 LAB — HEPATIC FUNCTION PANEL
ALT: 9 U/L (ref 0–35)
AST: 16 U/L (ref 0–37)
Albumin: 4.1 g/dL (ref 3.5–5.2)
Alkaline Phosphatase: 71 U/L (ref 39–117)
BILIRUBIN DIRECT: 0.1 mg/dL (ref 0.0–0.3)
BILIRUBIN TOTAL: 0.6 mg/dL (ref 0.2–1.2)
Indirect Bilirubin: 0.5 mg/dL (ref 0.2–1.2)
Total Protein: 6.3 g/dL (ref 6.0–8.3)

## 2014-01-14 LAB — CBC WITH DIFFERENTIAL/PLATELET
BASOS ABS: 0.1 10*3/uL (ref 0.0–0.1)
BASOS PCT: 1 % (ref 0–1)
EOS PCT: 3 % (ref 0–5)
Eosinophils Absolute: 0.2 10*3/uL (ref 0.0–0.7)
HCT: 37.1 % (ref 36.0–46.0)
Hemoglobin: 12.2 g/dL (ref 12.0–15.0)
LYMPHS ABS: 2.1 10*3/uL (ref 0.7–4.0)
Lymphocytes Relative: 37 % (ref 12–46)
MCH: 30 pg (ref 26.0–34.0)
MCHC: 32.9 g/dL (ref 30.0–36.0)
MCV: 91.4 fL (ref 78.0–100.0)
Monocytes Absolute: 0.6 10*3/uL (ref 0.1–1.0)
Monocytes Relative: 11 % (ref 3–12)
NEUTROS PCT: 48 % (ref 43–77)
Neutro Abs: 2.7 10*3/uL (ref 1.7–7.7)
Platelets: 294 10*3/uL (ref 150–400)
RBC: 4.06 MIL/uL (ref 3.87–5.11)
RDW: 15.8 % — AB (ref 11.5–15.5)
WBC: 5.6 10*3/uL (ref 4.0–10.5)

## 2014-01-14 LAB — LIPID PANEL
CHOL/HDL RATIO: 2.5 ratio
CHOLESTEROL: 150 mg/dL (ref 0–200)
HDL: 60 mg/dL (ref >39)
LDL CALC: 72 mg/dL (ref 0–99)
Triglycerides: 89 mg/dL (ref <150)
VLDL: 18 mg/dL (ref 0–40)

## 2014-01-14 LAB — URINALYSIS, MICROSCOPIC ONLY
Bacteria, UA: NONE SEEN
CASTS: NONE SEEN
Crystals: NONE SEEN

## 2014-01-14 LAB — MAGNESIUM: MAGNESIUM: 2.1 mg/dL (ref 1.5–2.5)

## 2014-01-14 LAB — MICROALBUMIN / CREATININE URINE RATIO
Creatinine, Urine: 20.2 mg/dL
Microalb Creat Ratio: 24.8 mg/g (ref 0.0–30.0)
Microalb, Ur: 0.5 mg/dL (ref 0.00–1.89)

## 2014-01-14 LAB — VITAMIN D 25 HYDROXY (VIT D DEFICIENCY, FRACTURES): Vit D, 25-Hydroxy: 68 ng/mL (ref 30–89)

## 2014-01-14 LAB — HEMOGLOBIN A1C
Hgb A1c MFr Bld: 5.6 % (ref <5.7)
Mean Plasma Glucose: 114 mg/dL (ref <117)

## 2014-01-14 LAB — INSULIN, FASTING: Insulin fasting, serum: 14 u[IU]/mL (ref 3–28)

## 2014-01-14 LAB — TSH: TSH: 2.48 u[IU]/mL (ref 0.350–4.500)

## 2014-01-17 ENCOUNTER — Other Ambulatory Visit: Payer: Self-pay

## 2014-01-17 DIAGNOSIS — R6884 Jaw pain: Secondary | ICD-10-CM

## 2014-01-28 ENCOUNTER — Encounter: Payer: Self-pay | Admitting: Gastroenterology

## 2014-01-28 ENCOUNTER — Other Ambulatory Visit: Payer: Self-pay | Admitting: Internal Medicine

## 2014-01-28 ENCOUNTER — Ambulatory Visit (INDEPENDENT_AMBULATORY_CARE_PROVIDER_SITE_OTHER): Payer: Commercial Managed Care - HMO | Admitting: Gastroenterology

## 2014-01-28 VITALS — BP 116/70 | HR 64 | Ht 62.25 in | Wt 170.0 lb

## 2014-01-28 DIAGNOSIS — K625 Hemorrhage of anus and rectum: Secondary | ICD-10-CM

## 2014-01-28 MED ORDER — LEVOTHYROXINE SODIUM 100 MCG PO TABS: 100.0000 ug | ORAL_TABLET | Freq: Every day | ORAL | Status: DC

## 2014-01-28 MED ORDER — OMEPRAZOLE 20 MG PO CPDR: 20.0000 mg | DELAYED_RELEASE_CAPSULE | Freq: Every day | ORAL | Status: DC

## 2014-01-28 NOTE — Assessment & Plan Note (Signed)
Limited rectal bleeding was very likely to to hemorrhoidal bleeding.  This was associated with constipation.  Both problems have resolved.  Recommendations #1 should rectal bleeding become a recurrent problem I suggested that she try hemorrhoidal suppositories.  Failing that, I would consider band ligation of internal hemorrhoids. #2 daily fiber supplementation

## 2014-01-28 NOTE — Patient Instructions (Signed)
Hemorrhoids Hemorrhoids are swollen veins around the rectum or anus. There are two types of hemorrhoids:   Internal hemorrhoids. These occur in the veins just inside the rectum. They may poke through to the outside and become irritated and painful.  External hemorrhoids. These occur in the veins outside the anus and can be felt as a painful swelling or hard lump near the anus. CAUSES  Pregnancy.   Obesity.   Constipation or diarrhea.   Straining to have a bowel movement.   Sitting for long periods on the toilet.  Heavy lifting or other activity that caused you to strain.  Anal intercourse. SYMPTOMS   Pain.   Anal itching or irritation.   Rectal bleeding.   Fecal leakage.   Anal swelling.   One or more lumps around the anus.  DIAGNOSIS  Your caregiver may be able to diagnose hemorrhoids by visual examination. Other examinations or tests that may be performed include:   Examination of the rectal area with a gloved hand (digital rectal exam).   Examination of anal canal using a small tube (scope).   A blood test if you have lost a significant amount of blood.  A test to look inside the colon (sigmoidoscopy or colonoscopy). TREATMENT Most hemorrhoids can be treated at home. However, if symptoms do not seem to be getting better or if you have a lot of rectal bleeding, your caregiver may perform a procedure to help make the hemorrhoids get smaller or remove them completely. Possible treatments include:   Placing a rubber band at the base of the hemorrhoid to cut off the circulation (rubber band ligation).   Injecting a chemical to shrink the hemorrhoid (sclerotherapy).   Using a tool to burn the hemorrhoid (infrared light therapy).   Surgically removing the hemorrhoid (hemorrhoidectomy).   Stapling the hemorrhoid to block blood flow to the tissue (hemorrhoid stapling).  HOME CARE INSTRUCTIONS   Eat foods with fiber, such as whole grains, beans,  nuts, fruits, and vegetables. Ask your doctor about taking products with added fiber in them (fibersupplements).  Increase fluid intake. Drink enough water and fluids to keep your urine clear or pale yellow.   Exercise regularly.   Go to the bathroom when you have the urge to have a bowel movement. Do not wait.   Avoid straining to have bowel movements.   Keep the anal area dry and clean. Use wet toilet paper or moist towelettes after a bowel movement.   Medicated creams and suppositories may be used or applied as directed.   Only take over-the-counter or prescription medicines as directed by your caregiver.   Take warm sitz baths for 15 20 minutes, 3 4 times a day to ease pain and discomfort.   Place ice packs on the hemorrhoids if they are tender and swollen. Using ice packs between sitz baths may be helpful.   Put ice in a plastic bag.   Place a towel between your skin and the bag.   Leave the ice on for 15 20 minutes, 3 4 times a day.   Do not use a donut-shaped pillow or sit on the toilet for long periods. This increases blood pooling and pain.  SEEK MEDICAL CARE IF:  You have increasing pain and swelling that is not controlled by treatment or medicine.  You have uncontrolled bleeding.  You have difficulty or you are unable to have a bowel movement.  You have pain or inflammation outside the area of the hemorrhoids. MAKE SURE YOU:    Understand these instructions.  Will watch your condition.  Will get help right away if you are not doing well or get worse. Document Released: 11/04/2000 Document Revised: 10/24/2012 Document Reviewed: 09/11/2012 [employer] Patient Information 2014 [employer].  

## 2014-01-28 NOTE — Progress Notes (Signed)
_                                                                                                                History of Present Illness: 73 year old white female with rheumatoid arthritis, history of gastric ulcer referred for evaluation of constipation and rectal bleeding.  She been suffering from mild constipation.  With straining she has seen small amount of blood on the toilet tissue.  At times she claimed that the tissue was full of blood.  Over the past week constipation and bleeding have subsided.  Colonoscopy in 2013 demonstrated diverticulosis and internal hemorrhoids.  She is without abdominal or rectal pain.  Hemoglobin one month ago was 12.2.    Past Medical History  Diagnosis Date  . Rheumatoid arthritis(714.0)   . Tremor, essential     right hand  . Environmental allergies   . Hypothyroidism   . Migraines   . Hyperlipidemia   . Heartburn   . Anemia   . Hiatal hernia   . Gastric ulcer   . Cataract   . Rash october 2012    all over abdomen and breasts  . Hypertension   . Prediabetes    Past Surgical History  Procedure Laterality Date  . Abdominal hysterectomy    . Tubal ligation    . Cataract extraction, bilateral    . Cholecystectomy  08/31/11    lap chole   . Colonoscopy     family history includes Diabetes in her mother and sister; Heart disease in her father, maternal aunt, maternal uncle, and mother. There is no history of Colon cancer. Current Outpatient Prescriptions  Medication Sig Dispense Refill  . butalbital-acetaminophen-caffeine (FIORICET) 50-325-40 MG per tablet Take 2 tablets by mouth 2 (two) times daily as needed.        . Cholecalciferol (VITAMIN D) 2000 UNITS CAPS Take by mouth. Take 4 capsules by mouth once daily       . Cyanocobalamin 500 MCG/0.1ML SOLN Place 1 mL under the tongue daily.       . diazepam (VALIUM) 2 MG tablet Take 2 mg by mouth at bedtime.       Marland Kitchen estradiol (ESTRACE) 1 MG tablet Take 1 mg by mouth daily.         . folic acid (FOLVITE) 540 MCG tablet Take 800 mcg by mouth daily.       . furosemide (LASIX) 40 MG tablet Take 40 mg by mouth daily.        . hydroxychloroquine (PLAQUENIL) 200 MG tablet Take 2 tablets by mouth once daily       . levothyroxine (SYNTHROID, LEVOTHROID) 100 MCG tablet Take 1 tablet (100 mcg total) by mouth daily.  90 tablet  99  . loratadine (CLARITIN) 10 MG tablet Take 10 mg by mouth daily.        . methotrexate (RHEUMATREX) 2.5 MG tablet Take 5 tablets by mouth on Tuesday and 5 tablets by mouth on Wednesday       . omeprazole (  PRILOSEC) 20 MG capsule Take 1 capsule (20 mg total) by mouth daily.  90 capsule  99  . pravastatin (PRAVACHOL) 40 MG tablet Take 40 mg by mouth daily.        . propranolol (INDERAL LA) 120 MG 24 hr capsule Take 120 mg by mouth daily.        . traMADol (ULTRAM) 50 MG tablet Take 50 mg by mouth at bedtime.       . verapamil (CALAN-SR) 180 MG CR tablet Take 180 mg by mouth at bedtime.        Marland Kitchen doxycycline (VIBRAMYCIN) 100 MG capsule Take 100 mg by mouth as needed.        No current facility-administered medications for this visit.   Allergies as of 01/28/2014 - Review Complete 01/28/2014  Allergen Reaction Noted  . Codeine Nausea Only 04/28/2011  . Mysoline [primidone] Nausea Only and Other (See Comments) 04/28/2011    reports that she quit smoking about 29 years ago. Her smoking use included Cigarettes. She smoked 0.00 packs per day. She has never used smokeless tobacco. She reports that she does not drink alcohol or use illicit drugs.     Review of Systems: Pertinent positive and negative review of systems were noted in the above HPI section. All other review of systems were otherwise negative.  Vital signs were reviewed in today's medical record Physical Exam: General: Well developed , well nourished, no acute distress On rectal exam there are small external skin tags   See Assessment and Plan under Problem List

## 2014-01-29 ENCOUNTER — Other Ambulatory Visit: Payer: Self-pay | Admitting: Internal Medicine

## 2014-01-29 ENCOUNTER — Ambulatory Visit (HOSPITAL_COMMUNITY)
Admission: RE | Admit: 2014-01-29 | Discharge: 2014-01-29 | Disposition: A | Discharge status: Home / Self Care | Payer: Medicare HMO | Source: Ambulatory Visit | Attending: Internal Medicine | Admitting: Internal Medicine

## 2014-01-29 DIAGNOSIS — R220 Localized swelling, mass and lump, head: Secondary | ICD-10-CM | POA: Insufficient documentation

## 2014-01-29 DIAGNOSIS — Z1231 Encounter for screening mammogram for malignant neoplasm of breast: Secondary | ICD-10-CM

## 2014-01-29 DIAGNOSIS — R221 Localized swelling, mass and lump, neck: Principal | ICD-10-CM

## 2014-02-10 ENCOUNTER — Other Ambulatory Visit: Payer: Self-pay | Admitting: *Deleted

## 2014-02-10 MED ORDER — OMEPRAZOLE 20 MG PO CPDR: 20.0000 mg | DELAYED_RELEASE_CAPSULE | Freq: Every day | ORAL | Status: DC

## 2014-02-10 MED ORDER — LEVOTHYROXINE SODIUM 100 MCG PO TABS: 100.0000 ug | ORAL_TABLET | Freq: Every day | ORAL | Status: DC

## 2014-02-21 ENCOUNTER — Ambulatory Visit (HOSPITAL_COMMUNITY)
Admission: RE | Admit: 2014-02-21 | Discharge: 2014-02-21 | Disposition: A | Discharge status: Home / Self Care | Payer: Medicare HMO | Source: Ambulatory Visit | Attending: Internal Medicine | Admitting: Internal Medicine

## 2014-02-21 DIAGNOSIS — Z1231 Encounter for screening mammogram for malignant neoplasm of breast: Secondary | ICD-10-CM

## 2014-03-06 ENCOUNTER — Other Ambulatory Visit: Payer: Self-pay | Admitting: *Deleted

## 2014-03-06 MED ORDER — OMEPRAZOLE 20 MG PO CPDR: 20.0000 mg | DELAYED_RELEASE_CAPSULE | Freq: Every day | ORAL | Status: DC

## 2014-03-06 MED ORDER — TRAMADOL HCL 50 MG PO TABS: 50.0000 mg | ORAL_TABLET | Freq: Every day | ORAL | Status: DC

## 2014-03-06 MED ORDER — PROPRANOLOL HCL ER 60 MG PO CP24: 60.0000 mg | ORAL_CAPSULE | Freq: Two times a day (BID) | ORAL | Status: DC

## 2014-03-06 MED ORDER — ESTRADIOL 1 MG PO TABS: 1.0000 mg | ORAL_TABLET | Freq: Every day | ORAL | Status: DC

## 2014-03-06 MED ORDER — DIAZEPAM 2 MG PO TABS: 2.0000 mg | ORAL_TABLET | Freq: Every day | ORAL | Status: DC

## 2014-03-06 MED ORDER — LEVOTHYROXINE SODIUM 100 MCG PO TABS: 100.0000 ug | ORAL_TABLET | Freq: Every day | ORAL | Status: DC

## 2014-03-06 MED ORDER — PRAVASTATIN SODIUM 40 MG PO TABS: 40.0000 mg | ORAL_TABLET | Freq: Every day | ORAL | Status: DC

## 2014-03-06 MED ORDER — FUROSEMIDE 40 MG PO TABS: 40.0000 mg | ORAL_TABLET | Freq: Every day | ORAL | Status: DC

## 2014-04-16 ENCOUNTER — Ambulatory Visit: Payer: Self-pay | Admitting: Physician Assistant

## 2014-04-17 ENCOUNTER — Ambulatory Visit (INDEPENDENT_AMBULATORY_CARE_PROVIDER_SITE_OTHER): Payer: Commercial Managed Care - HMO | Admitting: Physician Assistant

## 2014-04-17 ENCOUNTER — Encounter: Payer: Self-pay | Admitting: Physician Assistant

## 2014-04-17 VITALS — BP 118/60 | HR 60 | Temp 97.7°F | Resp 16 | Ht 63.0 in | Wt 168.0 lb

## 2014-04-17 DIAGNOSIS — E039 Hypothyroidism, unspecified: Secondary | ICD-10-CM

## 2014-04-17 DIAGNOSIS — D649 Anemia, unspecified: Secondary | ICD-10-CM

## 2014-04-17 DIAGNOSIS — R7303 Prediabetes: Secondary | ICD-10-CM

## 2014-04-17 DIAGNOSIS — I10 Essential (primary) hypertension: Secondary | ICD-10-CM

## 2014-04-17 DIAGNOSIS — R7309 Other abnormal glucose: Secondary | ICD-10-CM

## 2014-04-17 DIAGNOSIS — Z79899 Other long term (current) drug therapy: Secondary | ICD-10-CM

## 2014-04-17 DIAGNOSIS — E559 Vitamin D deficiency, unspecified: Secondary | ICD-10-CM

## 2014-04-17 DIAGNOSIS — E785 Hyperlipidemia, unspecified: Secondary | ICD-10-CM

## 2014-04-17 LAB — CBC WITH DIFFERENTIAL/PLATELET
BASOS PCT: 0 % (ref 0–1)
Basophils Absolute: 0 10*3/uL (ref 0.0–0.1)
Eosinophils Absolute: 0.1 10*3/uL (ref 0.0–0.7)
Eosinophils Relative: 2 % (ref 0–5)
HCT: 35.7 % — ABNORMAL LOW (ref 36.0–46.0)
Hemoglobin: 11.8 g/dL — ABNORMAL LOW (ref 12.0–15.0)
LYMPHS PCT: 43 % (ref 12–46)
Lymphs Abs: 2.2 10*3/uL (ref 0.7–4.0)
MCH: 30.2 pg (ref 26.0–34.0)
MCHC: 33.1 g/dL (ref 30.0–36.0)
MCV: 91.3 fL (ref 78.0–100.0)
Monocytes Absolute: 0.5 10*3/uL (ref 0.1–1.0)
Monocytes Relative: 10 % (ref 3–12)
NEUTROS PCT: 45 % (ref 43–77)
Neutro Abs: 2.3 10*3/uL (ref 1.7–7.7)
Platelets: 277 10*3/uL (ref 150–400)
RBC: 3.91 MIL/uL (ref 3.87–5.11)
RDW: 16 % — AB (ref 11.5–15.5)
WBC: 5.1 10*3/uL (ref 4.0–10.5)

## 2014-04-17 MED ORDER — CITALOPRAM HYDROBROMIDE 10 MG PO TABS: 10.0000 mg | ORAL_TABLET | Freq: Every day | ORAL | Status: DC

## 2014-04-17 NOTE — Patient Instructions (Addendum)
Go to ortho for possible injection for you left knee.   Will give you sample of probiotic to take daily for your stomach. Increase fluids.   Hospice  Address: [address] [place], [place] [address]  Phone: [phone]  What is the TMJ? The temporomandibular (tem-PUH-ro-man-DIB-yoo-ler) joint, or the TMJ, connects the upper and lower jawbones. This joint allows the jaw to open wide and move back and forth when you chew, talk, or yawn.There are also several muscles that help this joint move. There can be muscle tightness and pain in the muscle that can cause several symptoms.  What causes TMJ pain? There are many causes of TMJ pain. Repeated chewing (for example, chewing gum) and clenching your teeth can cause pain in the joint. Some TMJ pain has no obvious cause. What can I do to ease the pain? There are many things you can do to help your pain get better. When you have pain:  Eat soft foods and stay away from chewy foods (for example, taffy) Try to use both sides of your mouth to chew Don't chew gum Don't open your mouth wide (for example, during yawning or singing) Don't bite your cheeks or fingernails Lower your amount of stress and worry Applying a warm, damp washcloth to the joint may help. Over-the-counter pain medicines such as ibuprofen (one brand: Advil) or acetaminophen (one brand: Tylenol) might also help. Do not use these medicines if you are allergic to them or if your doctor told you not to use them. How can I stop the pain from coming back? When your pain is better, you can do these exercises to make your muscles stronger and to keep the pain from coming back:  Resisted mouth opening: Place your thumb or two fingers under your chin and open your mouth slowly, pushing up lightly on your chin with your thumb. Hold for three to six seconds. Close your mouth slowly. Resisted mouth closing: Place your thumbs under your chin and your two index fingers on the ridge between your  mouth and the bottom of your chin. Push down lightly on your chin as you close your mouth. Tongue up: Slowly open and close your mouth while keeping the tongue touching the roof of the mouth. Side-to-side jaw movement: Place an object about one fourth of an inch thick (for example, two tongue depressors) between your front teeth. Slowly move your jaw from side to side. Increase the thickness of the object as the exercise becomes easier Forward jaw movement: Place an object about one fourth of an inch thick between your front teeth and move the bottom jaw forward so that the bottom teeth are in front of the top teeth. Increase the thickness of the object as the exercise becomes easier. These exercises should not be painful. If it hurts to do these exercises, stop doing them and talk to your family doctor.   Constipation, Adult Constipation is when a person has fewer than 3 bowel movements a week; has difficulty having a bowel movement; or has stools that are dry, hard, or larger than normal. As people grow older, constipation is more common. If you try to fix constipation with medicines that make you have a bowel movement (laxatives), the problem may get worse. Long-term laxative use may cause the muscles of the colon to become weak. A low-fiber diet, not taking in enough fluids, and taking certain medicines may make constipation worse. CAUSES   Certain medicines, such as antidepressants, pain medicine, iron supplements, antacids, and  water pills.   Certain diseases, such as diabetes, irritable bowel syndrome (IBS), thyroid disease, or depression.   Not drinking enough water.   Not eating enough fiber-rich foods.   Stress or travel.  Lack of physical activity or exercise.  Not going to the restroom when there is the urge to have a bowel movement.  Ignoring the urge to have a bowel movement.  Using laxatives too much. SYMPTOMS   Having fewer than 3 bowel movements a week.    Straining to have a bowel movement.   Having hard, dry, or larger than normal stools.   Feeling full or bloated.   Pain in the lower abdomen.  Not feeling relief after having a bowel movement. DIAGNOSIS  Your caregiver will take a medical history and perform a physical exam. Further testing may be done for severe constipation. Some tests may include:   A barium enema X-ray to examine your rectum, colon, and sometimes, your small intestine.  A sigmoidoscopy to examine your lower colon.  A colonoscopy to examine your entire colon. TREATMENT  Treatment will depend on the severity of your constipation and what is causing it. Some dietary treatments include drinking more fluids and eating more fiber-rich foods. Lifestyle treatments may include regular exercise. If these diet and lifestyle recommendations do not help, your caregiver may recommend taking over-the-counter laxative medicines to help you have bowel movements. Prescription medicines may be prescribed if over-the-counter medicines do not work.  HOME CARE INSTRUCTIONS   Increase dietary fiber in your diet, such as fruits, vegetables, whole grains, and beans. Limit high-fat and processed sugars in your diet, such as [place] fries, hamburgers, cookies, candies, and soda.   A fiber supplement may be added to your diet if you cannot get enough fiber from foods.   Drink enough fluids to keep your urine clear or pale yellow.   Exercise regularly or as directed by your caregiver.   Go to the restroom when you have the urge to go. Do not hold it.  Only take medicines as directed by your caregiver. Do not take other medicines for constipation without talking to your caregiver first. [place] IF:   You have bright red blood in your stool.   Your constipation lasts for more than 4 days or gets worse.   You have abdominal or rectal pain.   You have thin, pencil-like stools.  You have unexplained  weight loss. MAKE SURE YOU:   Understand these instructions.  Will watch your condition.  Will get help right away if you are not doing well or get worse. Document Released: 08/05/2004 Document Revised: 01/30/2012 Document Reviewed: 08/19/2013 [facility] Patient Information 2014 [place], [place].

## 2014-04-17 NOTE — Progress Notes (Signed)
Assessment and Plan:  Hypertension: Continue medication, monitor blood pressure at home.  Continue DASH diet. Cholesterol: Continue diet and exercise. Check cholesterol.  Pre-diabetes-Continue diet and exercise. Check A1C Vitamin D Def- check level and continue medications.  Left knee pain- suggest seeing ortho again, sounds like meniscal tear/OA TMJ-information given to the patient, no gum/decrease hard foods, warm wet wash clothes, decrease stress, talk with dentist about possible night guard, can do massage, and exercise.  Constipation- try probiotic, increase water, and continue stool softener/fiber Depression- add celexa, given hospice number  Continue diet and meds as discussed. Further disposition pending results of labs.  HPI 73 y.o. female  presents for 3 month follow up with hypertension, hyperlipidemia, prediabetes and vitamin D. Her blood pressure has been controlled at home, today their BP is BP: 118/60 mmHg She does not workout. She denies chest pain, shortness of breath, dizziness.  She is not on cholesterol medication and denies myalgias. Her cholesterol is at goal. The cholesterol last visit was:   Lab Results  Component Value Date   CHOL 150 01/13/2014   HDL 60 01/13/2014   LDLCALC 72 01/13/2014   TRIG 89 01/13/2014   CHOLHDL 2.5 01/13/2014   Last A1C in the office was:  Lab Results  Component Value Date   HGBA1C 5.6 01/13/2014   Patient is on Vitamin D supplement.   She was having constipation and rectal bleeding in Feb and saw Dr. Deatra Ina in March and she was found to have hemorrhoids. She states she has increased fiber and is on stool softeners until this past Sunday she became constipated again. She finally took a laxative and states that she had a BM this morning. She continues to have some abdominal cramping but she is feeling better.  She is on tramadol for pain.  She continues to have a pain in her left knee, occ catches on her and radiates down her leg and up her  leg. History of injury 10 years ago with fall.  She has lost her son suddenly in Jan, for unknown cause, still waiting on autopsy. Decrease sleep, increase eating, she is crying currently, she has not seen a counselor, she denies suicidal ideation.   Current Medications:  Current Outpatient Prescriptions on File Prior to Visit  Medication Sig Dispense Refill  . Cholecalciferol (VITAMIN D) 2000 UNITS CAPS Take by mouth. Take 4 capsules by mouth once daily       . Cyanocobalamin 500 MCG/0.1ML SOLN Place 1 mL under the tongue daily.       . diazepam (VALIUM) 2 MG tablet Take 1 tablet (2 mg total) by mouth at bedtime.  90 tablet  3  . doxycycline (VIBRAMYCIN) 100 MG capsule Take 100 mg by mouth as needed.       Marland Kitchen estradiol (ESTRACE) 1 MG tablet Take 1 tablet (1 mg total) by mouth daily.  90 tablet  3  . folic acid (FOLVITE) 073 MCG tablet Take 800 mcg by mouth daily.       . furosemide (LASIX) 40 MG tablet Take 1 tablet (40 mg total) by mouth daily.  90 tablet  3  . hydroxychloroquine (PLAQUENIL) 200 MG tablet Take 2 tablets by mouth once daily       . levothyroxine (SYNTHROID, LEVOTHROID) 100 MCG tablet Take 1 tablet (100 mcg total) by mouth daily.  90 tablet  3  . loratadine (CLARITIN) 10 MG tablet Take 10 mg by mouth daily.        . methotrexate (  RHEUMATREX) 2.5 MG tablet Take 5 tablets by mouth on Tuesday and 5 tablets by mouth on Wednesday       . omeprazole (PRILOSEC) 20 MG capsule Take 1 capsule (20 mg total) by mouth daily.  90 capsule  3  . pravastatin (PRAVACHOL) 40 MG tablet Take 1 tablet (40 mg total) by mouth daily.  90 tablet  3  . propranolol ER (INDERAL LA) 60 MG 24 hr capsule Take 1 capsule (60 mg total) by mouth 2 (two) times daily.  180 capsule  3  . traMADol (ULTRAM) 50 MG tablet Take 1 tablet (50 mg total) by mouth at bedtime.  90 tablet  3  . verapamil (CALAN-SR) 180 MG CR tablet Take 180 mg by mouth at bedtime.         No current facility-administered medications on file  prior to visit.   Medical History:  Past Medical History  Diagnosis Date  . Rheumatoid arthritis(714.0)   . Tremor, essential     right hand  . Environmental allergies   . Hypothyroidism   . Migraines   . Hyperlipidemia   . Heartburn   . Anemia   . Hiatal hernia   . Gastric ulcer   . Cataract   . Rash october 2012    all over abdomen and breasts  . Hypertension   . Prediabetes    Allergies:  Allergies  Allergen Reactions  . Codeine Nausea Only  . Mysoline [Primidone] Nausea Only and Other (See Comments)    Over-sedated     Review of Systems: [X]  = complains of  [ ]  = denies  General: Fatigue [ ]  Fever [ ]  Chills [ ]  Weakness [ ]   Insomnia [ ]  Eyes: Redness [ ]  Blurred vision [ ]  Diplopia [ ]   ENT: Congestion [ ]  Sinus Pain [ ]  Post Nasal Drip [ ]  Sore Throat [ ]  Earache Valu.Nieves ]  Cardiac: Chest pain/pressure [ ]  SOB [ ]  Orthopnea [ ]   Palpitations [ ]   Paroxysmal nocturnal dyspnea[ ]  Claudication [ ]  Edema [ ]   Pulmonary: Cough [ ]  Wheezing[ ]   SOB [ ]   Snoring [ ]   GI: Nausea [ ]  Vomiting[ ]  Dysphagia[ ]  Heartburn[ ]  Abdominal pain [ ]  Constipation [ ] ; Diarrhea [ ] ; BRBPR [ ]  Melena[ ]  GU: Hematuria[ ]  Dysuria [ ]  Nocturia[ ]  Urgency [ ]   Hesitancy [ ]  Discharge [ ]  Neuro: Headaches[ ]  Vertigo[ ]  Paresthesias[ ]  Spasm [ ]  Speech changes [ ]  Incoordination [ ]   Ortho: Arthritis [ ]  Joint pain [ ]  Muscle pain [ ]  Joint swelling [ ]  Back Pain [ ]  Skin:  Rash [ ]   Pruritis [ ]  Change in skin lesion [ ]   Psych: Depression[X ] Anxiety[ ]  Confusion [ ]  Memory loss [ ]   Heme/Lypmh: Bleeding [ ]  Bruising [ ]  Enlarged lymph nodes [ ]   Endocrine: Visual blurring [ ]  Paresthesia [ ]  Polyuria [ ]  Polydypsea [ ]    Heat/cold intolerance [ ]  Hypoglycemia [ ]   Family history- Review and unchanged Social history- Review and unchanged Physical Exam: BP 118/60  Pulse 60  Temp(Src) 97.7 F (36.5 C)  Resp 16  Ht 5\' 3"  (1.6 m)  Wt 168 lb (76.204 kg)  BMI 29.77 kg/m2 Wt Readings from  Last 3 Encounters:  04/17/14 168 lb (76.204 kg)  01/28/14 170 lb (77.111 kg)  01/13/14 168 lb 9.6 oz (76.476 kg)   General Appearance: Well nourished, in no apparent distress. Eyes: PERRLA, EOMs, conjunctiva  no swelling or erythema Sinuses: No Frontal/maxillary tenderness ENT/Mouth: Ext aud canals clear, TMs without erythema, bulging. No erythema, swelling, or exudate on post pharynx.  Tonsils not swollen or erythematous. Hearing normal. + TMJ Neck: Supple, thyroid normal.  Respiratory: Respiratory effort normal, BS equal bilaterally without rales, rhonchi, wheezing or stridor.  Cardio: RRR with no MRGs. Brisk peripheral pulses without edema.  Abdomen: Soft, + BS.  Non tender, no guarding, rebound, hernias, masses. Lymphatics: Non tender without lymphadenopathy.  Musculoskeletal: left knee crepitus, medial joint line tenderness and positive McMurray no effusion, no erythema, ACL stable and PCL stable Skin: Warm, dry without rashes, lesions, ecchymosis.  Neuro: Cranial nerves intact. Normal muscle tone, no cerebellar symptoms. Sensation intact.  Psych: Awake and oriented X 3, crying, Insight and Judgment appropriate.   OVER 40 minutes of exam, counseling, chart review, referral performed  Vicie Mutters 3:49 PM

## 2014-04-18 LAB — VITAMIN D 25 HYDROXY (VIT D DEFICIENCY, FRACTURES): VIT D 25 HYDROXY: 71 ng/mL (ref 30–89)

## 2014-04-18 LAB — HEPATIC FUNCTION PANEL
ALT: 13 U/L (ref 0–35)
AST: 19 U/L (ref 0–37)
Albumin: 3.9 g/dL (ref 3.5–5.2)
Alkaline Phosphatase: 66 U/L (ref 39–117)
BILIRUBIN INDIRECT: 0.3 mg/dL (ref 0.2–1.2)
BILIRUBIN TOTAL: 0.4 mg/dL (ref 0.2–1.2)
Bilirubin, Direct: 0.1 mg/dL (ref 0.0–0.3)
Total Protein: 6.3 g/dL (ref 6.0–8.3)

## 2014-04-18 LAB — BASIC METABOLIC PANEL WITH GFR
BUN: 10 mg/dL (ref 6–23)
CHLORIDE: 103 meq/L (ref 96–112)
CO2: 30 mEq/L (ref 19–32)
Calcium: 9.1 mg/dL (ref 8.4–10.5)
Creat: 0.89 mg/dL (ref 0.50–1.10)
GFR, EST AFRICAN AMERICAN: 73 mL/min
GFR, EST NON AFRICAN AMERICAN: 64 mL/min
Glucose, Bld: 82 mg/dL (ref 70–99)
POTASSIUM: 4.3 meq/L (ref 3.5–5.3)
SODIUM: 143 meq/L (ref 135–145)

## 2014-04-18 LAB — LIPID PANEL
Cholesterol: 147 mg/dL (ref 0–200)
HDL: 66 mg/dL (ref >39)
LDL Cholesterol: 58 mg/dL (ref 0–99)
TRIGLYCERIDES: 117 mg/dL (ref <150)
Total CHOL/HDL Ratio: 2.2 Ratio
VLDL: 23 mg/dL (ref 0–40)

## 2014-04-18 LAB — HEMOGLOBIN A1C
Hgb A1c MFr Bld: 5.6 % (ref <5.7)
Mean Plasma Glucose: 114 mg/dL (ref <117)

## 2014-04-18 LAB — MAGNESIUM: MAGNESIUM: 2.2 mg/dL (ref 1.5–2.5)

## 2014-04-18 LAB — TSH: TSH: 2.329 u[IU]/mL (ref 0.350–4.500)

## 2014-04-18 LAB — INSULIN, FASTING: Insulin fasting, serum: 16 u[IU]/mL (ref 3–28)

## 2014-04-25 ENCOUNTER — Ambulatory Visit (INDEPENDENT_AMBULATORY_CARE_PROVIDER_SITE_OTHER): Payer: Commercial Managed Care - HMO | Admitting: *Deleted

## 2014-04-25 DIAGNOSIS — H6120 Impacted cerumen, unspecified ear: Secondary | ICD-10-CM

## 2014-04-25 NOTE — Progress Notes (Signed)
Patient ID: Dana [name], female   DOB: Mar 11, 1941, 73 y.o.   MRN: [id number] Patient presents for NV for right ear lavage.  Successful lavage of cerumen impaction from right ear.  Advised patient to follow up for ov if not relief.

## 2014-06-23 ENCOUNTER — Other Ambulatory Visit: Payer: Self-pay | Admitting: *Deleted

## 2014-06-23 MED ORDER — PROPRANOLOL HCL ER 60 MG PO CP24: 60.0000 mg | ORAL_CAPSULE | Freq: Two times a day (BID) | ORAL | Status: DC

## 2014-06-24 ENCOUNTER — Telehealth: Payer: Self-pay | Admitting: *Deleted

## 2014-06-24 ENCOUNTER — Other Ambulatory Visit: Payer: Self-pay | Admitting: *Deleted

## 2014-06-24 MED ORDER — PROPRANOLOL HCL 60 MG PO TABS: 60.0000 mg | ORAL_TABLET | Freq: Two times a day (BID) | ORAL | Status: DC

## 2014-06-24 NOTE — Telephone Encounter (Signed)
Pt needs a [employer] referral to Dr Golden Circle in [place] for arthritis this is a fu appointment scheduled for 08/13/14 at 2pm

## 2014-07-07 NOTE — Telephone Encounter (Signed)
Faxed referral to [facility]

## 2014-07-09 ENCOUNTER — Telehealth: Payer: Self-pay | Admitting: *Deleted

## 2014-07-09 NOTE — Telephone Encounter (Signed)
DR CHRISTINE MCCUEN   HAS A APPT ON Friday SAID SHE CALLED LAST WEEK TO GET THIS DONE ? PLEASE CALL PATIENT. PATIENT ALSO ASKING ABOUT APPT TO [facility] FOR NEXT MTH CHECKING STATUS OF APPT.

## 2014-07-10 NOTE — Telephone Encounter (Signed)
Referrals were submitted to [facility]. Authorizations were approved for patient to see Dr. Ellie Lunch and Dr.Emejuaiwe. Patient aware.

## 2014-07-17 ENCOUNTER — Ambulatory Visit: Payer: Self-pay | Admitting: Internal Medicine

## 2014-07-21 ENCOUNTER — Encounter: Payer: Self-pay | Admitting: Internal Medicine

## 2014-07-21 ENCOUNTER — Ambulatory Visit (INDEPENDENT_AMBULATORY_CARE_PROVIDER_SITE_OTHER): Payer: Commercial Managed Care - HMO | Admitting: Internal Medicine

## 2014-07-21 VITALS — BP 114/78 | HR 60 | Temp 97.9°F | Resp 16 | Ht 63.0 in | Wt 177.0 lb

## 2014-07-21 DIAGNOSIS — E559 Vitamin D deficiency, unspecified: Secondary | ICD-10-CM

## 2014-07-21 DIAGNOSIS — I10 Essential (primary) hypertension: Secondary | ICD-10-CM

## 2014-07-21 DIAGNOSIS — I8390 Asymptomatic varicose veins of unspecified lower extremity: Secondary | ICD-10-CM

## 2014-07-21 DIAGNOSIS — L570 Actinic keratosis: Secondary | ICD-10-CM

## 2014-07-21 DIAGNOSIS — Z79899 Other long term (current) drug therapy: Secondary | ICD-10-CM

## 2014-07-21 DIAGNOSIS — R7309 Other abnormal glucose: Secondary | ICD-10-CM

## 2014-07-21 DIAGNOSIS — R7303 Prediabetes: Secondary | ICD-10-CM

## 2014-07-21 DIAGNOSIS — E785 Hyperlipidemia, unspecified: Secondary | ICD-10-CM

## 2014-07-21 LAB — CBC WITH DIFFERENTIAL/PLATELET
BASOS PCT: 1 % (ref 0–1)
Basophils Absolute: 0.1 10*3/uL (ref 0.0–0.1)
Eosinophils Absolute: 0.2 10*3/uL (ref 0.0–0.7)
Eosinophils Relative: 3 % (ref 0–5)
HEMATOCRIT: 36.4 % (ref 36.0–46.0)
HEMOGLOBIN: 11.8 g/dL — AB (ref 12.0–15.0)
LYMPHS PCT: 36 % (ref 12–46)
Lymphs Abs: 2 10*3/uL (ref 0.7–4.0)
MCH: 30.6 pg (ref 26.0–34.0)
MCHC: 32.4 g/dL (ref 30.0–36.0)
MCV: 94.5 fL (ref 78.0–100.0)
MONO ABS: 0.7 10*3/uL (ref 0.1–1.0)
MONOS PCT: 12 % (ref 3–12)
Neutro Abs: 2.7 10*3/uL (ref 1.7–7.7)
Neutrophils Relative %: 48 % (ref 43–77)
Platelets: 362 10*3/uL (ref 150–400)
RBC: 3.85 MIL/uL — ABNORMAL LOW (ref 3.87–5.11)
RDW: 16.3 % — ABNORMAL HIGH (ref 11.5–15.5)
WBC: 5.6 10*3/uL (ref 4.0–10.5)

## 2014-07-21 LAB — HEMOGLOBIN A1C
Hgb A1c MFr Bld: 5.8 % — ABNORMAL HIGH (ref <5.7)
MEAN PLASMA GLUCOSE: 120 mg/dL — AB (ref <117)

## 2014-07-21 MED ORDER — PROPRANOLOL HCL 80 MG PO TABS: 80.0000 mg | ORAL_TABLET | Freq: Three times a day (TID) | ORAL | Status: DC

## 2014-07-21 NOTE — Progress Notes (Signed)
Patient ID: Dana [name], female   DOB: 07-15-1941, 73 y.o.   MRN: [id number]   This very nice 73 y.o.WWF presents for 3 month follow up with Hypertension, Hyperlipidemia, Hypothyroidism,  Pre-Diabetes and Vitamin D Deficiency. Patient has seroNegative RA dx'd in 2001 and is being followed at the [facility] in [place]. Patient's primary Sx's of hand/finger pains are significantly improved on Plaquenil.    Patient is treated for HTN & BP has been controlled at home. Today's BP: 114/78 mmHg. Patient denies any cardiac type chest pain, palpitations, dyspnea/orthopnea/PND, dizziness, claudication, or dependent edema.   Hyperlipidemia is controlled with diet & meds. Patient denies myalgias or other med SE's. Last Lipids were  Chol 147; HDL  66; LDL 58; Trig 117 on 04/17/2014.   Also, the patient has history of PreDiabetes and patient denies any symptoms of reactive hypoglycemia, diabetic polys, paresthesias or visual blurring.  Last A1c was  5.6% on 04/17/2014.    Further, Patient has history of Vitamin D Deficiency (17 in 2009) and patient supplements vitamin D without any suspected side-effects. Last vitamin D was  71 on 04/17/2014.   Medication List   citalopram 10 MG tablet  Commonly known as:  CELEXA  Take 1 tablet (10 mg total) by mouth daily.     Cyanocobalamin 500 MCG/0.1ML Soln  Place 1 mL under the tongue daily.     diazepam 2 MG tablet  Commonly known as:  VALIUM  Take 1 tablet (2 mg total) by mouth at bedtime.     doxycycline 100 MG capsule  Commonly known as:  VIBRAMYCIN  Take 100 mg by mouth as needed.     estradiol 1 MG tablet  Commonly known as:  ESTRACE  Take 1 tablet (1 mg total) by mouth daily.     folic acid 825 MCG tablet  Commonly known as:  FOLVITE  Take 800 mcg by mouth daily.     furosemide 40 MG tablet  Commonly known as:  LASIX  Take 1 tablet (40 mg total) by mouth daily.     hydroxychloroquine 200 MG tablet  Commonly known as:  PLAQUENIL  Take 2 tablets by mouth once  daily     levothyroxine 100 MCG tablet  Commonly known as:  SYNTHROID, LEVOTHROID  Take 1 tablet (100 mcg total) by mouth daily.     loratadine 10 MG tablet  Commonly known as:  CLARITIN  Take 10 mg by mouth daily.     methotrexate 2.5 MG tablet  Commonly known as:  RHEUMATREX  Take 5 tablets by mouth on Tuesday and 5 tablets by mouth on Wednesday     omeprazole 20 MG capsule  Commonly known as:  PRILOSEC  Take 1 capsule (20 mg total) by mouth daily.     pravastatin 40 MG tablet  Commonly known as:  PRAVACHOL  Take 1 tablet (40 mg total) by mouth daily.     propranolol 60 MG tablet  Commonly known as:  INDERAL  Take 1 tablet (60 mg total) by mouth 2 (two) times daily.     traMADol 50 MG tablet  Commonly known as:  ULTRAM  Take 1 tablet (50 mg total) by mouth at bedtime.     verapamil 180 MG CR tablet  Commonly known as:  CALAN-SR  Take 180 mg by mouth at bedtime.     Vitamin D 2000 UNITS Caps  Take by mouth. Take 4 capsules by mouth once daily       Allergies  Allergen  Reactions  . Codeine Nausea Only  . Mysoline [Primidone] Nausea Only and Other (See Comments)    Over-sedated   PMHx:   Past Medical History  Diagnosis Date  . Rheumatoid arthritis(714.0)   . Tremor, essential     right hand  . Environmental allergies   . Hypothyroidism   . Migraines   . Hyperlipidemia   . Heartburn   . Anemia   . Hiatal hernia   . Gastric ulcer   . Cataract   . Rash october 2012    all over abdomen and breasts  . Hypertension   . Prediabetes    FHx:    Reviewed / unchanged SHx:    Reviewed / unchanged  Systems Review:  Constitutional: Denies fever, chills, wt changes, headaches, insomnia, fatigue, night sweats, change in appetite. Eyes: Denies redness, blurred vision, diplopia, discharge, itchy, watery eyes.  ENT: Denies discharge, congestion, post nasal drip, epistaxis, sore throat, earache, hearing loss, dental pain, tinnitus, vertigo, sinus pain, snoring.  CV:  Denies chest pain, palpitations, irregular heartbeat, syncope, dyspnea, diaphoresis, orthopnea, PND, claudication or edema. Respiratory: denies cough, dyspnea, DOE, pleurisy, hoarseness, laryngitis, wheezing.  Gastrointestinal: Denies dysphagia, odynophagia, heartburn, reflux, water brash, abdominal pain or cramps, nausea, vomiting, bloating, diarrhea, constipation, hematemesis, melena, hematochezia  or hemorrhoids. Genitourinary: Denies dysuria, frequency, urgency, nocturia, hesitancy, discharge, hematuria or flank pain. Musculoskeletal: Denies arthralgias, myalgias, stiffness, jt. swelling, pain, limping or strain/sprain.  Skin: Denies pruritus, rash, hives, warts, acne, eczema or change in skin lesion(s). Neuro: No weakness, tremor, incoordination, spasms, paresthesia or pain. Psychiatric: Denies confusion, memory loss or sensory loss. Endo: Denies change in weight, skin or hair change.  Heme/Lymph: No excessive bleeding, bruising or enlarged lymph nodes.  Exam:  BP 114/78  Pulse 60  Temp 97.9 F   Resp 16  Ht 5\' 3"    Wt 177 lb   BMI 31.36  Appears well nourished and in no distress. Eyes: PERRLA, EOMs, conjunctiva no swelling or erythema. Sinuses: No frontal/maxillary tenderness ENT/Mouth: EAC's clear, TM's nl w/o erythema, bulging. Nares clear w/o erythema, swelling, exudates. Oropharynx clear without erythema or exudates. Oral hygiene is good. Tongue normal, non obstructing. Hearing intact.  Neck: Supple. Thyroid nl. Car 2+/2+ without bruits, nodes or JVD. Chest: Respirations nl with BS clear & equal w/o rales, rhonchi, wheezing or stridor.  Cor: Heart sounds normal w/ regular rate and rhythm without sig. murmurs, gallops, clicks, or rubs. Peripheral pulses normal and equal  without edema.  Abdomen: Soft & bowel sounds normal. Non-tender w/o guarding, rebound, hernias, masses, or organomegaly.  Lymphatics: Unremarkable.  Musculoskeletal: Full ROM all peripheral extremities, joint  stability, 5/5 strength, and normal gait. No Deformities.  Skin: Warm, dry without exposed rashes or ecchymosis apparent. Does have some scaly ? Ulcerated areas over the bridge of the nose. Also has Acne Rosacea. Neuro: Cranial nerves intact, reflexes equal bilaterally. Sensory-motor testing grossly intact. Tendon reflexes grossly intact.  Pysch: Alert & oriented x 3.  Insight and judgement nl & appropriate. No ideations.   Assessment and Plan:  1. Hypertension - Continue monitor blood pressure at home. Continue diet/meds same.  2. Hyperlipidemia - Continue diet/meds, exercise,& lifestyle modifications. Continue monitor periodic cholesterol/liver & renal functions   3. Pre-Diabetes - Continue diet, exercise, lifestyle modifications. Monitor appropriate labs.  4. Vitamin D Deficiency - Continue supplementation.  5. Rheumatoid Arthritis -   Recommended regular exercise, BP monitoring, weight control, and discussed med and SE's. Recommended labs to assess and monitor clinical status. Further  disposition pending results of labs.  Refer back to Dr Dayton Martes to evaluate the suspect Solar keratoses of her nose  And to Dr Patrecia Pour at the [facility].

## 2014-07-21 NOTE — Patient Instructions (Signed)

## 2014-07-22 LAB — VITAMIN D 25 HYDROXY (VIT D DEFICIENCY, FRACTURES): Vit D, 25-Hydroxy: 55 ng/mL (ref 30–89)

## 2014-07-22 LAB — HEPATIC FUNCTION PANEL
ALT: 11 U/L (ref 0–35)
AST: 19 U/L (ref 0–37)
Albumin: 3.9 g/dL (ref 3.5–5.2)
Alkaline Phosphatase: 67 U/L (ref 39–117)
Bilirubin, Direct: 0.1 mg/dL (ref 0.0–0.3)
Indirect Bilirubin: 0.3 mg/dL (ref 0.2–1.2)
Total Bilirubin: 0.4 mg/dL (ref 0.2–1.2)
Total Protein: 6.2 g/dL (ref 6.0–8.3)

## 2014-07-22 LAB — LIPID PANEL
Cholesterol: 149 mg/dL (ref 0–200)
HDL: 71 mg/dL (ref >39)
LDL CALC: 52 mg/dL (ref 0–99)
Total CHOL/HDL Ratio: 2.1 Ratio
Triglycerides: 129 mg/dL (ref <150)
VLDL: 26 mg/dL (ref 0–40)

## 2014-07-22 LAB — BASIC METABOLIC PANEL WITH GFR
BUN: 9 mg/dL (ref 6–23)
CHLORIDE: 103 meq/L (ref 96–112)
CO2: 28 mEq/L (ref 19–32)
Calcium: 9.1 mg/dL (ref 8.4–10.5)
Creat: 0.84 mg/dL (ref 0.50–1.10)
GFR, Est African American: 79 mL/min
GFR, Est Non African American: 68 mL/min
GLUCOSE: 73 mg/dL (ref 70–99)
POTASSIUM: 3.8 meq/L (ref 3.5–5.3)
Sodium: 142 mEq/L (ref 135–145)

## 2014-07-22 LAB — TSH: TSH: 1.952 u[IU]/mL (ref 0.350–4.500)

## 2014-07-22 LAB — INSULIN, FASTING: Insulin fasting, serum: 7.7 u[IU]/mL (ref 2.0–19.6)

## 2014-07-22 LAB — MAGNESIUM: Magnesium: 2.1 mg/dL (ref 1.5–2.5)

## 2014-08-05 ENCOUNTER — Other Ambulatory Visit: Payer: Self-pay

## 2014-08-05 MED ORDER — PROPRANOLOL HCL 80 MG PO TABS: 80.0000 mg | ORAL_TABLET | Freq: Three times a day (TID) | ORAL | Status: DC

## 2014-08-15 ENCOUNTER — Other Ambulatory Visit: Payer: Self-pay | Admitting: *Deleted

## 2014-08-15 MED ORDER — TRAMADOL HCL 50 MG PO TABS: 50.0000 mg | ORAL_TABLET | Freq: Four times a day (QID) | ORAL | Status: DC

## 2014-10-28 ENCOUNTER — Encounter: Payer: Self-pay | Admitting: Physician Assistant

## 2014-10-28 ENCOUNTER — Ambulatory Visit (INDEPENDENT_AMBULATORY_CARE_PROVIDER_SITE_OTHER): Payer: Commercial Managed Care - HMO | Admitting: Physician Assistant

## 2014-10-28 VITALS — BP 128/78 | HR 52 | Temp 97.7°F | Resp 16 | Ht 63.0 in | Wt 176.0 lb

## 2014-10-28 DIAGNOSIS — Z23 Encounter for immunization: Secondary | ICD-10-CM

## 2014-10-28 DIAGNOSIS — G250 Essential tremor: Secondary | ICD-10-CM

## 2014-10-28 DIAGNOSIS — E559 Vitamin D deficiency, unspecified: Secondary | ICD-10-CM

## 2014-10-28 DIAGNOSIS — D649 Anemia, unspecified: Secondary | ICD-10-CM

## 2014-10-28 DIAGNOSIS — R7303 Prediabetes: Secondary | ICD-10-CM

## 2014-10-28 DIAGNOSIS — R059 Cough, unspecified: Secondary | ICD-10-CM

## 2014-10-28 DIAGNOSIS — R7309 Other abnormal glucose: Secondary | ICD-10-CM

## 2014-10-28 DIAGNOSIS — Z79899 Other long term (current) drug therapy: Secondary | ICD-10-CM

## 2014-10-28 DIAGNOSIS — E785 Hyperlipidemia, unspecified: Secondary | ICD-10-CM

## 2014-10-28 DIAGNOSIS — I10 Essential (primary) hypertension: Secondary | ICD-10-CM

## 2014-10-28 DIAGNOSIS — M069 Rheumatoid arthritis, unspecified: Secondary | ICD-10-CM

## 2014-10-28 DIAGNOSIS — E039 Hypothyroidism, unspecified: Secondary | ICD-10-CM

## 2014-10-28 DIAGNOSIS — R05 Cough: Secondary | ICD-10-CM

## 2014-10-28 LAB — CBC WITH DIFFERENTIAL/PLATELET
Basophils Absolute: 0 K/uL (ref 0.0–0.1)
Basophils Relative: 1 % (ref 0–1)
Eosinophils Absolute: 0.1 K/uL (ref 0.0–0.7)
Eosinophils Relative: 3 % (ref 0–5)
HCT: 36.7 % (ref 36.0–46.0)
Hemoglobin: 12.3 g/dL (ref 12.0–15.0)
Lymphocytes Relative: 38 % (ref 12–46)
Lymphs Abs: 1.7 K/uL (ref 0.7–4.0)
MCH: 30.6 pg (ref 26.0–34.0)
MCHC: 33.5 g/dL (ref 30.0–36.0)
MCV: 91.3 fL (ref 78.0–100.0)
MPV: 9.4 fL (ref 9.4–12.4)
Monocytes Absolute: 0.9 K/uL (ref 0.1–1.0)
Monocytes Relative: 19 % — ABNORMAL HIGH (ref 3–12)
Neutro Abs: 1.8 K/uL (ref 1.7–7.7)
Neutrophils Relative %: 39 % — ABNORMAL LOW (ref 43–77)
Platelets: 281 K/uL (ref 150–400)
RBC: 4.02 MIL/uL (ref 3.87–5.11)
RDW: 16.5 % — ABNORMAL HIGH (ref 11.5–15.5)
WBC: 4.5 K/uL (ref 4.0–10.5)

## 2014-10-28 LAB — BASIC METABOLIC PANEL WITHOUT GFR
BUN: 12 mg/dL (ref 6–23)
CO2: 30 meq/L (ref 19–32)
Calcium: 9.3 mg/dL (ref 8.4–10.5)
Chloride: 103 meq/L (ref 96–112)
Creat: 0.88 mg/dL (ref 0.50–1.10)
GFR, Est African American: 74 mL/min
GFR, Est Non African American: 64 mL/min
Glucose, Bld: 84 mg/dL (ref 70–99)
Potassium: 4.2 meq/L (ref 3.5–5.3)
Sodium: 141 meq/L (ref 135–145)

## 2014-10-28 LAB — HEPATIC FUNCTION PANEL
ALT: 14 U/L (ref 0–35)
AST: 21 U/L (ref 0–37)
Albumin: 4 g/dL (ref 3.5–5.2)
Alkaline Phosphatase: 70 U/L (ref 39–117)
BILIRUBIN DIRECT: 0.1 mg/dL (ref 0.0–0.3)
BILIRUBIN TOTAL: 0.4 mg/dL (ref 0.2–1.2)
Indirect Bilirubin: 0.3 mg/dL (ref 0.2–1.2)
Total Protein: 6.7 g/dL (ref 6.0–8.3)

## 2014-10-28 LAB — LIPID PANEL
Cholesterol: 168 mg/dL (ref 0–200)
HDL: 64 mg/dL
LDL Cholesterol: 81 mg/dL (ref 0–99)
Total CHOL/HDL Ratio: 2.6 ratio
Triglycerides: 113 mg/dL
VLDL: 23 mg/dL (ref 0–40)

## 2014-10-28 LAB — MAGNESIUM: Magnesium: 2.2 mg/dL (ref 1.5–2.5)

## 2014-10-28 LAB — TSH: TSH: 4.536 u[IU]/mL — ABNORMAL HIGH (ref 0.350–4.500)

## 2014-10-28 MED ORDER — RANITIDINE HCL 300 MG PO TABS: 300.0000 mg | ORAL_TABLET | Freq: Every day | ORAL | Status: DC

## 2014-10-28 MED ORDER — BENZONATATE 100 MG PO CAPS: 100.0000 mg | ORAL_CAPSULE | Freq: Four times a day (QID) | ORAL | Status: DC | PRN

## 2014-10-28 NOTE — Progress Notes (Signed)
Assessment and Plan:  Hypertension: Continue medication, monitor blood pressure at home. Continue DASH diet.  Reminder to go to the ER if any CP, SOB, nausea, dizziness, severe HA, changes vision/speech, left arm numbness and tingling, and jaw pain. Cholesterol: Continue diet and exercise. Check cholesterol.  Pre-diabetes-Continue diet and exercise. Check A1C Vitamin D Def- check level and continue medications.  RA- on methotrexate- check CXR, may need PFTs Cough: Possible nocturnal GERD- Please increase your omeprazole to 20mg  in AM and Zantac at night PND- try certizine at night Work hard to stop throat clearing.  Plan a time when you will able to practice complete voice rest  Use sugar free candy to ease cough and throat irritation.  Tessalon Perles to suppress cough so your throat irritation can recover.    Continue diet and meds as discussed. Further disposition pending results of labs.  HPI 73 y.o. female  presents for 3 month follow up with hypertension, hyperlipidemia, prediabetes and vitamin D. Her blood pressure has been controlled at home, today their BP is BP: 128/78 mmHg She does not workout. She denies chest pain, shortness of breath, dizziness.  She is on cholesterol medication, pravastatin and denies myalgias. Her cholesterol is at goal. The cholesterol last visit was:   Lab Results  Component Value Date   CHOL 149 07/21/2014   HDL 71 07/21/2014   LDLCALC 52 07/21/2014   TRIG 129 07/21/2014   CHOLHDL 2.1 07/21/2014   She has been working on diet and exercise for prediabetes, and denies paresthesia of the feet, polydipsia, polyuria and visual disturbances. Last A1C in the office was:  Lab Results  Component Value Date   HGBA1C 5.8* 07/21/2014   Patient is on Vitamin D supplement, 8000 IU.   Lab Results  Component Value Date   VD25OH 70 07/21/2014     She is on thyroid medication. Her medication was not changed last visit. Patient denies fatigue, weight changes,  heat/cold intolerance, bowel/skin changes or CVS symptoms  Lab Results  Component Value Date   TSH 1.952 07/21/2014  .  She has had a cough, productive in the AM for 3 weeks. She has post nasal drip, she is on claritin. She denies fever, chills, SOB, wheezing, CP.  She has RA and follows with [facility], she is on methotrexate and plaquenil.  She has bilateral knee pain. She has essential tremor, affects vocal cords, on propanolol.    Current Medications:  Current Outpatient Prescriptions on File Prior to Visit  Medication Sig Dispense Refill  . Cholecalciferol (VITAMIN D) 2000 UNITS CAPS Take by mouth. Take 4 capsules by mouth once daily     . Cyanocobalamin 500 MCG/0.1ML SOLN Place 1 mL under the tongue daily.     . diazepam (VALIUM) 2 MG tablet Take 1 tablet (2 mg total) by mouth at bedtime. 90 tablet 3  . doxycycline (VIBRAMYCIN) 100 MG capsule Take 100 mg by mouth as needed.     Marland Kitchen estradiol (ESTRACE) 1 MG tablet Take 1 tablet (1 mg total) by mouth daily. 90 tablet 3  . folic acid (FOLVITE) 412 MCG tablet Take 800 mcg by mouth daily.     . furosemide (LASIX) 40 MG tablet Take 1 tablet (40 mg total) by mouth daily. 90 tablet 3  . hydroxychloroquine (PLAQUENIL) 200 MG tablet Take 2 tablets by mouth once daily     . levothyroxine (SYNTHROID, LEVOTHROID) 100 MCG tablet Take 1 tablet (100 mcg total) by mouth daily. 90 tablet 3  .  loratadine (CLARITIN) 10 MG tablet Take 10 mg by mouth daily.      . methotrexate (RHEUMATREX) 2.5 MG tablet Take 5 tablets by mouth on Tuesday and 5 tablets by mouth on Wednesday     . omeprazole (PRILOSEC) 20 MG capsule Take 1 capsule (20 mg total) by mouth daily. 90 capsule 3  . pravastatin (PRAVACHOL) 40 MG tablet Take 1 tablet (40 mg total) by mouth daily. 90 tablet 3  . propranolol (INDERAL) 80 MG tablet Take 1 tablet (80 mg total) by mouth 3 (three) times daily. 270 tablet prn  . traMADol (ULTRAM) 50 MG tablet Take 1 tablet (50 mg total) by mouth 4 (four)  times daily. 360 tablet 1   No current facility-administered medications on file prior to visit.   Medical History:  Past Medical History  Diagnosis Date  . Rheumatoid arthritis(714.0)   . Tremor, essential     right hand  . Environmental allergies   . Hypothyroidism   . Migraines   . Hyperlipidemia   . Heartburn   . Anemia   . Hiatal hernia   . Gastric ulcer   . Cataract   . Rash october 2012    all over abdomen and breasts  . Hypertension   . Prediabetes    Allergies:  Allergies  Allergen Reactions  . Codeine Nausea Only  . Mysoline [Primidone] Nausea Only and Other (See Comments)    Over-sedated     Review of Systems:  Review of Systems  Constitutional: Negative.  Negative for fever and chills.  HENT: Positive for congestion. Negative for ear discharge, ear pain, hearing loss, nosebleeds, sore throat and tinnitus.   Eyes: Negative.   Respiratory: Positive for cough. Negative for hemoptysis, sputum production, shortness of breath, wheezing and stridor.   Cardiovascular: Negative.   Gastrointestinal: Negative.   Genitourinary: Negative.   Musculoskeletal: Negative.   Skin: Negative.   Neurological: Negative.  Negative for headaches.  Psychiatric/Behavioral: Negative.     Family history- Review and unchanged Social history- Review and unchanged Physical Exam: BP 128/78 mmHg  Pulse 52  Temp(Src) 97.7 F (36.5 C)  Resp 16  Ht 5\' 3"  (1.6 m)  Wt 176 lb (79.833 kg)  BMI 31.18 kg/m2 Wt Readings from Last 3 Encounters:  10/28/14 176 lb (79.833 kg)  07/21/14 177 lb (80.287 kg)  04/17/14 168 lb (76.204 kg)   General Appearance: Well nourished, in no apparent distress. Eyes: PERRLA, EOMs, conjunctiva no swelling or erythema Sinuses: No Frontal/maxillary tenderness ENT/Mouth: Ext aud canals clear, TMs without erythema, bulging. No erythema, swelling, or exudate on post pharynx.  Tonsils not swollen or erythematous. Hearing normal.  Neck: Supple, thyroid normal.   Respiratory: Respiratory effort normal, BS equal bilaterally without rales, rhonchi, wheezing or stridor.  Cardio: RRR with no MRGs. Brisk peripheral pulses without edema.  Abdomen: Soft, + BS, epigastric Non tender, no guarding, rebound, hernias, masses. Lymphatics: Non tender without lymphadenopathy.  Musculoskeletal: Full ROM, 5/5 strength, normal gait.  Skin: Warm, dry without rashes, lesions, ecchymosis.  Neuro: Cranial nerves intact. Normal muscle tone, no cerebellar symptoms. Sensation intact.  Psych: Awake and oriented X 3, normal affect, Insight and Judgment appropriate.    Vicie Mutters, PA-C 4:02 PM [facility]

## 2014-10-28 NOTE — Patient Instructions (Addendum)
Possible nocturnal GERD- Please increase your omeprazole to 20mg  in AM and Zantac at night PND- try certizine at night Work hard to stop throat clearing.  Plan a time when you will able to practice complete voice rest  Use sugar free candy to ease cough and throat irritation.  Tessalon Perles to suppress cough so your throat irritation can recover.   check CXR, may need PFTs     Bad carbs also include fruit juice, alcohol, and sweet tea. These are empty calories that do not signal to your brain that you are full.   Please remember the good carbs are still carbs which convert into sugar. So please measure them out no more than 1/2-1 cup of rice, oatmeal, pasta, and beans  Veggies are however free foods! Pile them on.   Not all fruit is created equal. Please see the list below, the fruit at the bottom is higher in sugars than the fruit at the top. Please avoid all dried fruits.

## 2014-10-29 ENCOUNTER — Ambulatory Visit (HOSPITAL_COMMUNITY)
Admission: RE | Admit: 2014-10-29 | Discharge: 2014-10-29 | Disposition: A | Discharge status: Home / Self Care | Payer: Commercial Managed Care - HMO | Source: Ambulatory Visit | Attending: Physician Assistant | Admitting: Physician Assistant

## 2014-10-29 DIAGNOSIS — R059 Cough, unspecified: Secondary | ICD-10-CM

## 2014-10-29 DIAGNOSIS — R0989 Other specified symptoms and signs involving the circulatory and respiratory systems: Secondary | ICD-10-CM | POA: Insufficient documentation

## 2014-10-29 DIAGNOSIS — R05 Cough: Secondary | ICD-10-CM | POA: Insufficient documentation

## 2014-10-29 LAB — VITAMIN D 25 HYDROXY (VIT D DEFICIENCY, FRACTURES): Vit D, 25-Hydroxy: 33 ng/mL (ref 30–100)

## 2014-10-29 LAB — HEMOGLOBIN A1C
HEMOGLOBIN A1C: 5.7 % — AB (ref <5.7)
Mean Plasma Glucose: 117 mg/dL — ABNORMAL HIGH (ref <117)

## 2014-11-24 ENCOUNTER — Ambulatory Visit (INDEPENDENT_AMBULATORY_CARE_PROVIDER_SITE_OTHER): Payer: PPO | Admitting: Physician Assistant

## 2014-11-24 ENCOUNTER — Encounter: Payer: Self-pay | Admitting: Physician Assistant

## 2014-11-24 VITALS — BP 110/68 | HR 64 | Temp 97.7°F | Resp 16 | Ht 63.0 in | Wt 175.0 lb

## 2014-11-24 DIAGNOSIS — J069 Acute upper respiratory infection, unspecified: Secondary | ICD-10-CM

## 2014-11-24 DIAGNOSIS — E039 Hypothyroidism, unspecified: Secondary | ICD-10-CM

## 2014-11-24 MED ORDER — AZITHROMYCIN 250 MG PO TABS: ORAL_TABLET | ORAL | Status: DC

## 2014-11-24 MED ORDER — PREDNISONE 20 MG PO TABS: ORAL_TABLET | ORAL | Status: DC

## 2014-11-24 MED ORDER — PROMETHAZINE-CODEINE 6.25-10 MG/5ML PO SYRP
5.0000 mL | ORAL_SOLUTION | Freq: Four times a day (QID) | ORAL | Status: DC | PRN
Start: 2014-11-24 — End: 2015-01-26

## 2014-11-24 NOTE — Progress Notes (Signed)
   Subjective:    Patient ID: Laurin Coder, female    DOB: December 02, 1940, 74 y.o.   MRN: [id number]  HPI 74 y.o. female with history of HTN, chol, preDM, RA on methotrexate presents with cough, sinus pain, and sore throat getting progressively worse over 3 months. She was seen in the office 10/29/2014 for a chronic cough with post nasal drip, this time she has been having yellow mucus and fatigue. Cough is worse at night, she has had some chills but no fever, and she has had wheezing last night. Normal CXR 10/2014.    Wt Readings from Last 3 Encounters:  11/24/14 175 lb (79.379 kg)  10/28/14 176 lb (79.833 kg)  07/21/14 177 lb (80.287 kg)    Review of Systems  Constitutional: Positive for chills and fatigue. Negative for fever.  HENT: Positive for congestion, rhinorrhea, sinus pressure and sore throat. Negative for dental problem, ear discharge, ear pain, nosebleeds, trouble swallowing and voice change.   Respiratory: Positive for cough and wheezing. Negative for apnea, choking, chest tightness, shortness of breath and stridor.   Cardiovascular: Negative.   Gastrointestinal: Negative.   Genitourinary: Negative.   Musculoskeletal: Negative.   Neurological: Negative.        Objective:   Physical Exam  Constitutional: She appears well-developed and well-nourished.  HENT:  Head: Normocephalic and atraumatic.  Right Ear: External ear normal.  Nose: Right sinus exhibits maxillary sinus tenderness. Right sinus exhibits no frontal sinus tenderness. Left sinus exhibits maxillary sinus tenderness. Left sinus exhibits no frontal sinus tenderness.  Eyes: Conjunctivae and EOM are normal.  Neck: Normal range of motion. Neck supple.  Cardiovascular: Normal rate, regular rhythm, normal heart sounds and intact distal pulses.   Pulmonary/Chest: Effort normal and breath sounds normal. No respiratory distress. She has no wheezes.  Abdominal: Soft. Bowel sounds are normal.  Lymphadenopathy:    She has  cervical adenopathy.  Skin: Skin is warm and dry.       Assessment & Plan:  1. Upper respiratory tract infection URI- Discussed diagnosis and treatment of URI. Suggested symptomatic OTC remedies. Nasal saline spray for congestion. Follow up as needed. Will send a note to her rheumatologist about possible PFTs with recurrent cough/SOB, had normal CXR.  - azithromycin (ZITHROMAX) 250 MG tablet; 2 tablets by mouth today then one tablet daily for 4 days.  Dispense: 6 tablet; Refill: 1 - predniSONE (DELTASONE) 20 MG tablet; 2 tablets daily for 3 days, 1 tablet daily for 4 days.  Dispense: 10 tablet; Refill: 0 - promethazine-codeine (PHENERGAN WITH CODEINE) 6.25-10 MG/5ML syrup; Take 5 mLs by mouth every 6 (six) hours as needed for cough.  Dispense: 240 mL; Refill: 0   2. Hypothyroidism-  Started to take meds on empty stomach, no change in dose. Will check TSH today.

## 2014-11-24 NOTE — Patient Instructions (Signed)
Rest and stay hydrated.  Make sure you drink plenty of fluids to make sure urine is clear when you urinate.  Water will help thin out mucous. - Take Mucinex DM- Maximum Strength over the counter to thin out and cough up the thick mucous.  Please follow directions on box. -Take Albuterol if prescribed.  I will give you a prescription for an antibiotic, but please only take it if you are not feeling better in 7-10 days.  Bronchitis is mostly caused by viruses and the antibiotic will do nothing.  Please call the office or message through My Chart if you have any questions.   Acute Bronchitis Bronchitis is when the airways that extend from the windpipe into the lungs get red, puffy, and painful (inflamed). Bronchitis often causes thick spit (mucus) to develop. This leads to a cough. A cough is the most common symptom of bronchitis. In acute bronchitis, the condition usually begins suddenly and goes away over time (usually in 2 weeks). Smoking, allergies, and asthma can make bronchitis worse. Repeated episodes of bronchitis may cause more lung problems.  Most common cause of Bronchitis is viruses (rhinovirus, coronavirus, RSV).  Therefore, not requiring an antibiotic; as antibiotics only treat bacterial infections.  HOME CARE  Rest.  Drink enough fluids to keep your pee (urine) clear or pale yellow (unless you need to limit fluids as told by your doctor).  Only take over-the-counter or prescription medicines as told by your doctor.  Avoid smoking and secondhand smoke. These can make bronchitis worse. If you are a smoker, think about using nicotine gum or skin patches. Quitting smoking will help your lungs heal faster.  Reduce the chance of getting bronchitis again by:  Washing your hands often.  Avoiding people with cold symptoms.  Trying not to touch your hands to your mouth, nose, or eyes.  Follow up with your doctor as told. GET HELP IF: Your symptoms do not improve after 1 week of  treatment. Symptoms include:  Cough.  Fever.  Coughing up thick spit.  Body aches.  Chest congestion.  Chills.  Shortness of breath.  Sore throat. GET HELP RIGHT AWAY IF:   You have an increased fever.  You have chills.  You have severe shortness of breath.  You have bloody thick spit (sputum).  You throw up (vomit) often.  You lose too much body fluid (dehydration).  You have a severe headache.  You faint. MAKE SURE YOU:   Understand these instructions.  Will watch your condition.  Will get help right away if you are not doing well or get worse. Document Released: 04/25/2008 Document Revised: 07/10/2013 Document Reviewed: 04/30/2013 [facility] Patient Information 2015 [place], [place]. This information is not intended to replace advice given to you by your health care provider. Make sure you discuss any questions you have with your health care provider.

## 2014-11-25 LAB — TSH: TSH: 0.933 u[IU]/mL (ref 0.350–4.500)

## 2014-12-02 ENCOUNTER — Ambulatory Visit: Payer: Self-pay

## 2015-01-07 ENCOUNTER — Encounter: Payer: Self-pay | Admitting: Internal Medicine

## 2015-01-08 ENCOUNTER — Other Ambulatory Visit: Payer: Self-pay | Admitting: *Deleted

## 2015-01-08 MED ORDER — PROPRANOLOL HCL 80 MG PO TABS: 80.0000 mg | ORAL_TABLET | Freq: Three times a day (TID) | ORAL | Status: DC

## 2015-01-13 ENCOUNTER — Encounter: Payer: Self-pay | Admitting: Internal Medicine

## 2015-01-26 ENCOUNTER — Ambulatory Visit (INDEPENDENT_AMBULATORY_CARE_PROVIDER_SITE_OTHER): Payer: PPO | Admitting: Internal Medicine

## 2015-01-26 ENCOUNTER — Encounter: Payer: Self-pay | Admitting: Internal Medicine

## 2015-01-26 VITALS — BP 132/64 | HR 54 | Temp 98.0°F | Resp 16 | Ht 63.0 in | Wt 180.0 lb

## 2015-01-26 DIAGNOSIS — L259 Unspecified contact dermatitis, unspecified cause: Secondary | ICD-10-CM

## 2015-01-26 MED ORDER — TRIAMCINOLONE ACETONIDE 0.1 % EX CREA: 1.0000 "application " | TOPICAL_CREAM | Freq: Three times a day (TID) | CUTANEOUS | Status: DC

## 2015-01-26 MED ORDER — DOXYCYCLINE HYCLATE 100 MG PO CAPS: 100.0000 mg | ORAL_CAPSULE | Freq: Two times a day (BID) | ORAL | Status: DC

## 2015-01-26 NOTE — Progress Notes (Signed)
   Subjective:    Patient ID: Dana [name], female    DOB: 1941-02-16, 74 y.o.   MRN: [id number]  HPI  Patient is a 74 y.o. Female who presents to the office for evaluation of a rash.  The rash started on Thursday morning.  The rash is located on her right buttock.  It is itchy, red, and has some vessicles. There is no draining that she is aware of.  No relieving factors found from neosporin, hydrocortisone, metronidazole. Patient thinks that her daily allergy medicine is helping a little bit.  No new products including soaps, detergents, lotions, clothes.  Patient does report working in the yard Wednesday.  Patient reports the rash is staying the same and has not gotten worse.  No other people live in the house.      Review of Systems  Constitutional: Negative for fever, chills and fatigue.  Respiratory: Negative for chest tightness and shortness of breath.   Gastrointestinal: Negative for nausea and vomiting.  Skin: Positive for rash. Negative for color change, pallor and wound.       Objective:   Physical Exam  Constitutional: She is oriented to person, place, and time. She appears well-developed and well-nourished. No distress.  HENT:  Head: Normocephalic and atraumatic.  Mouth/Throat: Oropharynx is clear and moist. No oropharyngeal exudate.  Eyes: Conjunctivae are normal. No scleral icterus.  Neck: Normal range of motion. Neck supple. No JVD present. No thyromegaly present.  Cardiovascular: Normal rate, regular rhythm, normal heart sounds and intact distal pulses.  Exam reveals no gallop and no friction rub.   No murmur heard. Pulmonary/Chest: Effort normal and breath sounds normal. No respiratory distress. She has no wheezes. She has no rales. She exhibits no tenderness.  Musculoskeletal: Normal range of motion.  Lymphadenopathy:    She has no cervical adenopathy.  Neurological: She is alert and oriented to person, place, and time.  Skin: Skin is warm and dry. Rash noted. Rash is  vesicular. She is not diaphoretic. There is erythema.     Psychiatric: She has a normal mood and affect. Her behavior is normal. Judgment and thought content normal.  Nursing note and vitals reviewed.         Assessment & Plan:    1. Contact dermatitis  Patient has a vessicular rash on the right buttock.  This rash does not appear to be dermatomal so highly doubt that this is shingles.  Suspect that this may be more likely contact dermatitis given non-dermatomal distribution and itching.  Will cover for possible cellulitis with doxycycline.  Will give triamcinolone cream for itching.  Given local distribution will try local steroid cream first prior to systemic steroids.  Patient to return for worsening signs of infection or lack of improvement.  Patient states understanding and agreement.    - triamcinolone cream (KENALOG) 0.1 %; Apply 1 application topically 3 (three) times daily.  Dispense: 30 g; Refill: 0 -Doxy 100 mg BID x 7 days.

## 2015-01-26 NOTE — Patient Instructions (Signed)

## 2015-02-05 ENCOUNTER — Encounter: Payer: Self-pay | Admitting: Internal Medicine

## 2015-02-05 ENCOUNTER — Ambulatory Visit (INDEPENDENT_AMBULATORY_CARE_PROVIDER_SITE_OTHER): Payer: PPO | Admitting: Internal Medicine

## 2015-02-05 VITALS — BP 106/78 | HR 72 | Temp 98.2°F | Resp 16 | Ht 62.25 in | Wt 179.2 lb

## 2015-02-05 DIAGNOSIS — Z23 Encounter for immunization: Secondary | ICD-10-CM

## 2015-02-05 DIAGNOSIS — I10 Essential (primary) hypertension: Secondary | ICD-10-CM

## 2015-02-05 DIAGNOSIS — M069 Rheumatoid arthritis, unspecified: Secondary | ICD-10-CM

## 2015-02-05 DIAGNOSIS — G250 Essential tremor: Secondary | ICD-10-CM

## 2015-02-05 DIAGNOSIS — Z9181 History of falling: Secondary | ICD-10-CM

## 2015-02-05 DIAGNOSIS — Z0000 Encounter for general adult medical examination without abnormal findings: Secondary | ICD-10-CM

## 2015-02-05 DIAGNOSIS — Z79899 Other long term (current) drug therapy: Secondary | ICD-10-CM

## 2015-02-05 DIAGNOSIS — R7303 Prediabetes: Secondary | ICD-10-CM

## 2015-02-05 DIAGNOSIS — E039 Hypothyroidism, unspecified: Secondary | ICD-10-CM

## 2015-02-05 DIAGNOSIS — Z0001 Encounter for general adult medical examination with abnormal findings: Secondary | ICD-10-CM

## 2015-02-05 DIAGNOSIS — E559 Vitamin D deficiency, unspecified: Secondary | ICD-10-CM

## 2015-02-05 DIAGNOSIS — R6889 Other general symptoms and signs: Secondary | ICD-10-CM

## 2015-02-05 DIAGNOSIS — Z1331 Encounter for screening for depression: Secondary | ICD-10-CM

## 2015-02-05 DIAGNOSIS — E785 Hyperlipidemia, unspecified: Secondary | ICD-10-CM

## 2015-02-05 LAB — BASIC METABOLIC PANEL WITH GFR
BUN: 10 mg/dL (ref 6–23)
CO2: 32 mEq/L (ref 19–32)
CREATININE: 0.78 mg/dL (ref 0.50–1.10)
Calcium: 9.1 mg/dL (ref 8.4–10.5)
Chloride: 102 mEq/L (ref 96–112)
GFR, Est African American: 86 mL/min
GFR, Est Non African American: 75 mL/min
Glucose, Bld: 75 mg/dL (ref 70–99)
POTASSIUM: 4.2 meq/L (ref 3.5–5.3)
SODIUM: 141 meq/L (ref 135–145)

## 2015-02-05 LAB — HEPATIC FUNCTION PANEL
ALBUMIN: 3.8 g/dL (ref 3.5–5.2)
ALT: 19 U/L (ref 0–35)
AST: 27 U/L (ref 0–37)
Alkaline Phosphatase: 67 U/L (ref 39–117)
BILIRUBIN DIRECT: 0.1 mg/dL (ref 0.0–0.3)
Indirect Bilirubin: 0.5 mg/dL (ref 0.2–1.2)
TOTAL PROTEIN: 6.5 g/dL (ref 6.0–8.3)
Total Bilirubin: 0.6 mg/dL (ref 0.2–1.2)

## 2015-02-05 LAB — LIPID PANEL
Cholesterol: 169 mg/dL (ref 0–200)
HDL: 67 mg/dL (ref >=46)
LDL Cholesterol: 77 mg/dL (ref 0–99)
TRIGLYCERIDES: 126 mg/dL (ref <150)
Total CHOL/HDL Ratio: 2.5 Ratio
VLDL: 25 mg/dL (ref 0–40)

## 2015-02-05 LAB — MAGNESIUM: Magnesium: 2.2 mg/dL (ref 1.5–2.5)

## 2015-02-05 NOTE — Progress Notes (Signed)
Patient ID: Dana [name], female   DOB: 09-Oct-1941, 74 y.o.   MRN: [id number]  MEDICARE ANNUAL WELLNESS VISIT AND CPE  Assessment:   1. Essential hypertension  - Microalbumin / creatinine urine ratio - EKG 12-Lead - [place], RETROPERITNL ABD,  LTD - TSH  2. Hyperlipidemia  - Lipid panel  3. Prediabetes  - Hemoglobin A1c - Insulin, random  4. Vitamin D deficiency  - Vit D  25 hydroxy   5. Hypothyroidism  6. Rheumatoid arthritis   7. Tremor, essential  - controlled w/ Diazepam & Propranolol  8. Medication management  - Urine Microscopic - CBC with Differential/Platelet - BASIC METABOLIC PANEL WITH GFR - Hepatic function panel - Magnesium  9. Need for prophylactic vaccination with tetanus-diphtheria (TD)  - Dt vaccine greater than 7yo IM  10. At low risk for fall   11. Depression screen   12. Routine general medical examination at a health care facility   Plan:   During the course of the visit the patient was educated and counseled about appropriate screening and preventive services including:    Pneumococcal vaccine   Influenza vaccine  Td vaccine  Screening electrocardiogram  Bone densitometry screening  Colorectal cancer screening  Diabetes screening  Glaucoma screening  Nutrition counseling   Advanced directives: requested  Screening recommendations, referrals: Vaccinations:  Immunization History  Administered Date(s) Administered  . Influenza Split 09/12/2013  . Influenza, High Dose Seasonal PF 10/28/2014  . Pneumococcal-Unspecified 11/21/2006  . Td 11/22/2003  . Varicella 06/23/2011  Prevnar vaccine ordered Hep B vaccine not indicated  Nutrition assessed and recommended  Colonoscopy 09/2012 - rrecc 10 yr f/u Recommended yearly ophthalmology/optometry visit for glaucoma screening and checkup Recommended yearly dental visit for hygiene and checkup Advanced directives - no - offered forms  Conditions/risks identified: BMI:  Discussed weight loss, diet, and increase physical activity.  Increase physical activity: AHA recommends 150 minutes of physical activity a week.  Medications reviewed PreDiabetes is at goal, ACE/ARB therapy: Not indicated Urinary Incontinence is not an issue: discussed non pharmacology and pharmacology options.  Fall risk: low- discussed PT, home fall assessment, medications.   Subjective:    Dana [name] is a 74 y.o. female who presents for Medicare Annual Wellness Visit and complete physical.  No prior medicare wellness visit is known.  This very nice 74 y.o. WWF presents for  follow up with Hypertension, Hyperlipidemia, Hypothyroidism, Pre-Diabetes and Vitamin D Deficiency. Patient is followed at [facility] in [place] for Rheumatoid Arthritis.    Patient is treated for HTN since 1998 & BP has been controlled at home. Today's BP: 106/78 mmHg. Patient has had no complaints of any cardiac type chest pain, palpitations, dyspnea/orthopnea/PND, dizziness, claudication, or dependent edema.   Hyperlipidemia is controlled with diet & meds. Patient denies myalgias or other med SE's. Last Lipids were at goal - Total Chol 168; HDL 64; LDL  81; Trig 113 on 10/28/2014.   Also, the patient has history of PreDiabetes and has had no symptoms of reactive hypoglycemia, diabetic polys, paresthesias or visual blurring.  Last A1c was 5.7% on 10/28/2014.    Patient has been hypothyroid for a number of years and has been adequately compensated shown by periodic testing. Further, the patient also has history of Vitamin D Deficiency of 17 in 2008 and supplements vitamin D sporadically without any suspected side-effects. Last vitamin D was still very low at  33 on  10/28/2014.  Names of Other Physician/Practitioners you currently use: 1. [facility] here for primary care 2. Dr Maris Berger, eye doctor, last visit 06/2014 3. [facility] for cleanings, dentist, last visit  annual  Patient Care Team: Lucky Cowboy, MD as PCP - General (Internal Medicine) Tivis Ringer, MD as Referring Physician (Internal Medicine/Rheumatology)  Medication Review: Medication Sig  . benzonatate  PERLES)100 MG  Take 1 cap every 6  hours as needed  . VITAMIN D  2000 UNITS CAPS Take by mouth. Take 4 capsules  once daily   . Cyanocobalamin 500 MCG/0.1ML SOLN Place 1 mL under the tongue daily.   . diazepam  2 MG tablet Take 1 tablet (2 mg total) by mouth at bedtime.  Marland Kitchen estradiol  1 MG tablet Take 1 tablet (1 mg total) by mouth daily.  . folic acid 800 MCG tablet Take 800 mcg by mouth daily.   . furosemide  40 MG tablet Take 1 tablet (40 mg total) by mouth daily.  . hydroxychloroquine (PLAQUENIL) 200 MG tablet Take 2 tablets by mouth once daily   . levothyroxine  100 MCG tablet Take 1 tablet (100 mcg total) by mouth daily.  Marland Kitchen loratadine  10 MG tablet Take 10 mg by mouth daily.    . methotrexate (RHEUMATREX) 2.5 MG tab Take 5 tablets by mouth on Tuesday and 5 tablets by mouth on Wednesday   . pravastatin  40 MG tablet Take 1 tablet (40 mg total) by mouth daily.  . propranolol  80 MG tablet Take 1 tablet (80 mg total) by mouth 3 (three) times daily.  . ranitidine 300 MG tablet Take 1 tablet (300 mg total) by mouth at bedtime.  . traMADol  50 MG tablet Take 1 tablet (50 mg total) by mouth 4 (four) times daily.  Marland Kitchen triamcinolone cream (KENALOG) 0.1 % Apply 1 application topically 3 (three) times daily.   Current Problems (verified) Patient Active Problem List   Diagnosis Date Noted  . Vitamin D deficiency 01/06/2014  . Medication management 01/06/2014  . Hypertension   . Prediabetes   . Rheumatoid arthritis   . Tremor, essential   . Hypothyroidism   . Migraines   . Hyperlipidemia   . Anemia    Screening Tests Health Maintenance  Topic Date Due  . ZOSTAVAX  03/09/1999  . TETANUS/TDAP  11/21/2013  . INFLUENZA VACCINE  06/22/2015  . COLONOSCOPY  10/02/2022  .  DEXA SCAN  Completed   Immunization History  Administered Date(s) Administered  . Influenza Split 09/12/2013  . Influenza, High Dose Seasonal PF 10/28/2014  . Pneumococcal-Unspecified 11/21/2006  . Td 11/22/2003  . Varicella 06/23/2011   Preventative care: Last colonoscopy:08/2012  - 10 yr f/u  History reviewed: allergies, current medications, past family history, past medical history, past social history, past surgical history and problem list  Risk Factors: Tobacco History  Substance Use Topics  . Smoking status: Former Smoker    Types: Cigarettes    Quit date: 09/18/1984  . Smokeless tobacco: Never Used     Comment: stopped around 25 years ago  . Alcohol Use: No   She does not smoke.  Patient is a former smoker. Are there smokers in your home (other than you)?  No Alcohol Current alcohol use: none  Caffeine Current caffeine use: denies use  Exercise Current exercise: housecleaning and walking  Nutrition/Diet Current diet: in general, a "healthy" diet    Cardiac risk factors: advanced age (older than 37 for men, 49 for women), dyslipidemia, hypertension, obesity (BMI >= 30  kg/m2), sedentary lifestyle and smoking/ tobacco exposure.  Depression Screen (Note: if answer to either of the following is "Yes", a more complete depression screening is indicated)   Q1: Over the past two weeks, have you felt down, depressed or hopeless? No  Q2: Over the past two weeks, have you felt little interest or pleasure in doing things? No  Have you lost interest or pleasure in daily life? No  Do you often feel hopeless? No  Do you cry easily over simple problems? No  Activities of Daily Living In your present state of health, do you have any difficulty performing the following activities?:  Driving? No Managing money?  No Feeding yourself? No Getting from bed to chair? No Climbing a flight of stairs? No Preparing food and eating?: No Bathing or showering? No Getting dressed:  No Getting to the toilet? No Using the toilet:No Moving around from place to place: No In the past year have you fallen or had a near fall?:No  Are you sexually active?  No  Do you have more than one partner?  No  Vision Difficulties: No  Hearing Difficulties: No Do you often ask people to speak up or repeat themselves? No Do you experience ringing or noises in your ears? No Do you have difficulty understanding soft or whispered voices? Sometimes.  Cognition  Do you feel that you have a problem with memory?No  Do you often misplace items? No  Do you feel safe at home?  Yes  Advanced directives Does patient have a Health Care Power of Attorney? No - offered forms Does patient have a Living Will? No - offered forms  Past Medical History  Diagnosis Date  . Rheumatoid arthritis(714.0)   . Tremor, essential     right hand  . Environmental allergies   . Hypothyroidism   . Migraines   . Hyperlipidemia   . Heartburn   . Anemia   . Hiatal hernia   . Gastric ulcer   . Cataract   . Rash october 2012    all over abdomen and breasts  . Hypertension   . Prediabetes    Past Surgical History  Procedure Laterality Date  . Abdominal hysterectomy    . Tubal ligation    . Cataract extraction, bilateral    . Cholecystectomy  08/31/11    lap chole   . Colonoscopy      ROS: Constitutional: Denies fever, chills, weight loss/gain, headaches, insomnia, fatigue, night sweats, and change in appetite. Eyes: Denies redness, blurred vision, diplopia, discharge, itchy, watery eyes.  ENT: Denies discharge, congestion, post nasal drip, epistaxis, sore throat, earache, hearing loss, dental pain, Tinnitus, Vertigo, Sinus pain, snoring.  Cardio: Denies chest pain, palpitations, irregular heartbeat, syncope, dyspnea, diaphoresis, orthopnea, PND, claudication, edema Respiratory: denies cough, dyspnea, DOE, pleurisy, hoarseness, laryngitis, wheezing.  Gastrointestinal: Denies dysphagia,  heartburn, reflux, water brash, pain, cramps, nausea, vomiting, bloating, diarrhea, constipation, hematemesis, melena, hematochezia, jaundice, hemorrhoids Genitourinary: Denies dysuria, frequency, urgency, nocturia, hesitancy, discharge, hematuria, flank pain Breast: Breast lumps, nipple discharge, bleeding.  Musculoskeletal: Denies arthralgia, myalgia, stiffness, Jt. Swelling, pain, limp, and strain/sprain. Denies falls. Skin: Denies puritis, rash, hives, warts, acne, eczema, changing in skin lesion Neuro: No weakness, tremor, incoordination, spasms, paresthesia, pain Psychiatric: Denies confusion, memory loss, sensory loss. Denies Depression. Endocrine: Denies change in weight, skin, hair change, nocturia, and paresthesia, diabetic polys, visual blurring, hyper / hypo glycemic episodes.  Heme/Lymph: No excessive bleeding, bruising, enlarged lymph nodes  Objective:     BP 106/78  Pulse 72  Temp 98.2 F  Resp 16  Ht 5' 2.25"   Wt 179 lb 3.2 oz     BMI 32.52   General Appearance: Well nourished, alert, WD/WN, female and in no apparent distress. Eyes: PERRLA, EOMs, conjunctiva no swelling or erythema, normal fundi and vessels. Sinuses: No frontal/maxillary tenderness ENT/Mouth: EACs patent / TMs  nl. Nares clear without erythema, swelling, mucoid exudates. Oral hygiene is good. No erythema, swelling, or exudate. Tongue normal, non-obstructing. Tonsils not swollen or erythematous. Hearing normal.  Neck: Supple, thyroid normal. No bruits, nodes or JVD. Respiratory: Respiratory effort normal.  BS equal and clear bilateral without rales, rhonci, wheezing or stridor. Cardio: Heart sounds are normal with regular rate and rhythm and no murmurs, rubs or gallops. Peripheral pulses are normal and equal bilaterally without edema. No aortic or femoral bruits. Chest: symmetric with normal excursions and percussion. Breasts: Symmetric, without lumps, nipple discharge, retractions, or fibrocystic  changes.  Abdomen: Flat, soft  with nl bowel sounds. Nontender, no guarding, rebound, hernias, masses, or organomegaly.  Lymphatics: Non tender without lymphadenopathy.  Genitourinary:  Musculoskeletal: Full ROM all peripheral extremities, joint stability, 5/5 strength, and normal gait. Skin: Warm and dry without rashes, lesions, cyanosis, clubbing or  ecchymosis.  Neuro: Cranial nerves intact, reflexes equal bilaterally. Normal muscle tone, no cerebellar symptoms. Sensation intact.  Pysch: Awake and oriented X 3, normal affect, Insight and Judgment appropriate.   Cognitive Testing  Alert? Yes  Normal Appearance?Yes  Oriented to person? Yes  Place? Yes   Time? Yes  Recall of three objects?  Yes  Can perform simple calculations? Yes  Displays appropriate judgment? Yes  Can read the correct time from a watch/clock?Yes  Medicare Attestation I have personally reviewed: The patient's medical and social history Their use of alcohol, tobacco or illicit drugs Their current medications and supplements The patient's functional ability including ADLs,fall risks, home safety risks, cognitive, and hearing and visual impairment Diet and physical activities Evidence for depression or mood disorders  The patient's weight, height, BMI, and visual acuity have been recorded in the chart.  I have made referrals, counseling, and provided education to the patient based on review of the above and I have provided the patient with a written personalized care plan for preventive services.    Presly Steinruck DAVID, MD   02/05/2015

## 2015-02-05 NOTE — Patient Instructions (Signed)

## 2015-02-06 LAB — CBC WITH DIFFERENTIAL/PLATELET
BASOS PCT: 1 % (ref 0–1)
Basophils Absolute: 0 10*3/uL (ref 0.0–0.1)
EOS PCT: 3 % (ref 0–5)
Eosinophils Absolute: 0.1 10*3/uL (ref 0.0–0.7)
HCT: 36.8 % (ref 36.0–46.0)
HEMOGLOBIN: 12.2 g/dL (ref 12.0–15.0)
LYMPHS ABS: 1.8 10*3/uL (ref 0.7–4.0)
LYMPHS PCT: 41 % (ref 12–46)
MCH: 31.4 pg (ref 26.0–34.0)
MCHC: 33.2 g/dL (ref 30.0–36.0)
MCV: 94.6 fL (ref 78.0–100.0)
MPV: 9.4 fL (ref 8.6–12.4)
Monocytes Absolute: 0.3 10*3/uL (ref 0.1–1.0)
Monocytes Relative: 7 % (ref 3–12)
NEUTROS ABS: 2.2 10*3/uL (ref 1.7–7.7)
Neutrophils Relative %: 48 % (ref 43–77)
Platelets: 304 10*3/uL (ref 150–400)
RBC: 3.89 MIL/uL (ref 3.87–5.11)
RDW: 16.1 % — ABNORMAL HIGH (ref 11.5–15.5)
WBC: 4.5 10*3/uL (ref 4.0–10.5)

## 2015-02-06 LAB — URINALYSIS, MICROSCOPIC ONLY
BACTERIA UA: NONE SEEN
CASTS: NONE SEEN
Crystals: NONE SEEN
SQUAMOUS EPITHELIAL / LPF: NONE SEEN

## 2015-02-06 LAB — HEMOGLOBIN A1C
Hgb A1c MFr Bld: 5.7 % — ABNORMAL HIGH (ref <5.7)
MEAN PLASMA GLUCOSE: 117 mg/dL — AB (ref <117)

## 2015-02-06 LAB — VITAMIN D 25 HYDROXY (VIT D DEFICIENCY, FRACTURES): VIT D 25 HYDROXY: 33 ng/mL (ref 30–100)

## 2015-02-06 LAB — MICROALBUMIN / CREATININE URINE RATIO
Creatinine, Urine: 20.2 mg/dL
Microalb, Ur: <0.2 mg/dL (ref <2.0)

## 2015-02-06 LAB — TSH: TSH: 3.868 u[IU]/mL (ref 0.350–4.500)

## 2015-02-06 LAB — INSULIN, RANDOM: INSULIN: 9.7 u[IU]/mL (ref 2.0–19.6)

## 2015-02-06 MED ORDER — TETANUS-DIPHTHERIA TOXOIDS TD 5-2 LFU IM INJ: 0.5000 mL | INJECTION | Freq: Once | INTRAMUSCULAR | Status: DC

## 2015-02-07 ENCOUNTER — Encounter: Payer: Self-pay | Admitting: Internal Medicine

## 2015-02-10 ENCOUNTER — Other Ambulatory Visit: Payer: Self-pay | Admitting: *Deleted

## 2015-02-10 MED ORDER — LEVOTHYROXINE SODIUM 100 MCG PO TABS
100.0000 ug | ORAL_TABLET | Freq: Every day | ORAL | Status: DC
Start: 2015-02-10 — End: 2016-02-10

## 2015-02-10 MED ORDER — RANITIDINE HCL 300 MG PO TABS: 300.0000 mg | ORAL_TABLET | Freq: Every day | ORAL | Status: DC

## 2015-02-10 MED ORDER — FUROSEMIDE 40 MG PO TABS: 40.0000 mg | ORAL_TABLET | Freq: Every day | ORAL | Status: DC

## 2015-03-10 ENCOUNTER — Other Ambulatory Visit: Payer: Self-pay | Admitting: Internal Medicine

## 2015-03-10 ENCOUNTER — Ambulatory Visit
Admission: RE | Admit: 2015-03-10 | Discharge: 2015-03-10 | Disposition: A | Discharge status: Home / Self Care | Payer: PPO | Source: Ambulatory Visit | Attending: Internal Medicine | Admitting: Internal Medicine

## 2015-03-10 DIAGNOSIS — M0589 Other rheumatoid arthritis with rheumatoid factor of multiple sites: Secondary | ICD-10-CM

## 2015-05-12 ENCOUNTER — Encounter: Payer: Self-pay | Admitting: Internal Medicine

## 2015-05-12 ENCOUNTER — Ambulatory Visit (INDEPENDENT_AMBULATORY_CARE_PROVIDER_SITE_OTHER): Payer: PPO | Admitting: Internal Medicine

## 2015-05-12 VITALS — BP 116/58 | HR 56 | Temp 98.2°F | Resp 18 | Ht 63.0 in | Wt 180.0 lb

## 2015-05-12 DIAGNOSIS — R7303 Prediabetes: Secondary | ICD-10-CM

## 2015-05-12 DIAGNOSIS — E785 Hyperlipidemia, unspecified: Secondary | ICD-10-CM

## 2015-05-12 DIAGNOSIS — Z79899 Other long term (current) drug therapy: Secondary | ICD-10-CM

## 2015-05-12 DIAGNOSIS — Z23 Encounter for immunization: Secondary | ICD-10-CM

## 2015-05-12 DIAGNOSIS — D649 Anemia, unspecified: Secondary | ICD-10-CM

## 2015-05-12 DIAGNOSIS — I10 Essential (primary) hypertension: Secondary | ICD-10-CM

## 2015-05-12 DIAGNOSIS — R7309 Other abnormal glucose: Secondary | ICD-10-CM

## 2015-05-12 DIAGNOSIS — E559 Vitamin D deficiency, unspecified: Secondary | ICD-10-CM

## 2015-05-12 DIAGNOSIS — E039 Hypothyroidism, unspecified: Secondary | ICD-10-CM

## 2015-05-12 LAB — CBC WITH DIFFERENTIAL/PLATELET
BASOS ABS: 0 10*3/uL (ref 0.0–0.1)
Basophils Relative: 0 % (ref 0–1)
Eosinophils Absolute: 0.2 10*3/uL (ref 0.0–0.7)
Eosinophils Relative: 3 % (ref 0–5)
HCT: 37.5 % (ref 36.0–46.0)
Hemoglobin: 12.2 g/dL (ref 12.0–15.0)
Lymphocytes Relative: 43 % (ref 12–46)
Lymphs Abs: 2.5 10*3/uL (ref 0.7–4.0)
MCH: 31.3 pg (ref 26.0–34.0)
MCHC: 32.5 g/dL (ref 30.0–36.0)
MCV: 96.2 fL (ref 78.0–100.0)
MONO ABS: 0.7 10*3/uL (ref 0.1–1.0)
MPV: 9.7 fL (ref 8.6–12.4)
Monocytes Relative: 12 % (ref 3–12)
NEUTROS PCT: 42 % — AB (ref 43–77)
Neutro Abs: 2.4 10*3/uL (ref 1.7–7.7)
Platelets: 253 10*3/uL (ref 150–400)
RBC: 3.9 MIL/uL (ref 3.87–5.11)
RDW: 15.2 % (ref 11.5–15.5)
WBC: 5.7 10*3/uL (ref 4.0–10.5)

## 2015-05-12 MED ORDER — ESTRADIOL 1 MG PO TABS: 1.0000 mg | ORAL_TABLET | Freq: Every day | ORAL | Status: DC

## 2015-05-12 NOTE — Patient Instructions (Addendum)
[facility] Address: [address], [place], [place] [address]  Phone: [phone]   Ways to cut 100 calories  1. Eat your eggs with hot sauce OR salsa instead of cheese.  Eggs are great for breakfast, but many people consider eggs and cheese to be BFFs. Instead of cheese-1 oz. of cheddar has 114 calories-top your eggs with hot sauce, which contains no calories and helps with satiety and metabolism. Salsa is also a great option!!  2. Top your toast, waffles or pancakes with mashed berries instead of jelly or syrup. Half a cup of berries-fresh, frozen or thawed-has about 40 calories, compared with 2 tbsp. of maple syrup or jelly, which both have about 100 calories. The berries will also give you a good punch of fiber, which helps keep you full and satisfied and won't spike blood sugar quickly like the jelly or syrup. 3. Swap the non-fat latte for black coffee with a splash of half-and-half. Contrary to its name, that non-fat latte has 130 calories and a startling 19g of carbohydrates per 16 oz. serving. Replacing that 'light' drinkable dessert with a black coffee with a splash of half-and-half saves you more than 100 calories per 16 oz. serving. 4. Sprinkle salads with freeze-dried raspberries instead of dried cranberries. If you want a sweet addition to your nutritious salad, stay away from dried cranberries. They have a whopping 130 calories per  cup and 30g carbohydrates. Instead, sprinkle freeze-dried raspberries guilt-free and save more than 100 calories per  cup serving, adding 3g of belly-filling fiber. 5. Go for mustard in place of mayo on your sandwich. Mustard can add really nice flavor to any sandwich, and there are tons of varieties, from spicy to honey. A serving of mayo is 95 calories, versus 10 calories in a serving of mustard. 6. Choose a DIY salad dressing instead of the store-bought kind. Mix Dijon or whole grain mustard with low-fat Kefir or red wine vinegar and  garlic. 7. Use hummus as a spread instead of a dip. Use hummus as a spread on a high-fiber cracker or tortilla with a sandwich and save on calories without sacrificing taste. 8. Pick just one salad "accessory." Salad isn't automatically a calorie winner. It's easy to over-accessorize with toppings. Instead of topping your salad with nuts, avocado and cranberries (all three will clock in at 313 calories), just pick one. The next day, choose a different accessory, which will also keep your salad interesting. You don't wear all your jewelry every day, right? 9. Ditch the white pasta in favor of spaghetti squash. One cup of cooked spaghetti squash has about 40 calories, compared with traditional spaghetti, which comes with more than 200. Spaghetti squash is also nutrient-dense. It's a good source of fiber and Vitamins A and C, and it can be eaten just like you would eat pasta-with a great tomato sauce and [place] meatballs or with pesto, tofu and spinach, for example. 10. Dress up your chili, soups and stews with non-fat [place] yogurt instead of sour cream. Just a 'dollop' of sour cream can set you back 115 calories and a whopping 12g of fat-seven of which are of the artery-clogging variety. Added bonus: [place] yogurt is packed with muscle-building protein, calcium and B Vitamins. 11. Mash cauliflower instead of mashed potatoes. One cup of traditional mashed potatoes-in all their creamy goodness-has more than 200 calories, compared to mashed cauliflower, which you can typically eat for less than 100 calories per 1 cup serving. Cauliflower is a great source  of the antioxidant indole-3-carbinol (I3C), which may help reduce the risk of some cancers, like breast cancer. 12. Ditch the ice cream sundae in favor of a [place] yogurt parfait. Instead of a cup of ice cream or fro-yo for dessert, try 1 cup of nonfat Greek yogurt topped with fresh berries and a sprinkle of cacao nibs. Both toppings are packed with  antioxidants, which can help reduce cellular inflammation and oxidative damage. And the comparison is a no-brainer: One cup of ice cream has about 275 calories; one cup of frozen yogurt has about 230; and a cup of Greek yogurt has just 130, plus twice the protein, so you're less likely to return to the freezer for a second helping. 13. Put olive oil in a spray container instead of using it directly from the bottle. Each tablespoon of olive oil is 120 calories and 15g of fat. Use a mister instead of pouring it straight into the pan or onto a salad. This allows for portion control and will save you more than 100 calories. 14. When baking, substitute canned pumpkin for butter or oil. Canned pumpkin-not pumpkin pie mix-is loaded with Vitamin A, which is important for skin and eye health, as well as immunity. And the comparisons are pretty crazy:  cup of canned pumpkin has about 40 calories, compared to butter or oil, which has more than 800 calories. Yes, 800 calories. Applesauce and mashed banana can also serve as good substitutions for butter or oil, usually in a 1:1 ratio. 15. Top casseroles with high-fiber cereal instead of breadcrumbs. Breadcrumbs are typically made with white bread, while breakfast cereals contain 5-9g of fiber per serving. Not only will you save more than 150 calories per  cup serving, the swap will also keep you more full and you'll get a metabolism boost from the added fiber. 16. Snack on pistachios instead of macadamia nuts. Believe it or not, you get the same amount of calories from 35 pistachios (100 calories) as you would from only five macadamia nuts. 17. Chow down on kale chips rather than potato chips. This is my favorite 'don't knock it 'till you try it' swap. Kale chips are so easy to make at home, and you can spice them up with a little grated parmesan or chili powder. Plus, they're a mere fraction of the calories of potato chips, but with the same crunch factor we crave  so often. 18. Add seltzer and some fruit slices to your cocktail instead of soda or fruit juice. One cup of soda or fruit juice can pack on as much as 140 calories. Instead, use seltzer and fruit slices. The fruit provides valuable phytochemicals, such as flavonoids and anthocyanins, which help to combat cancer and stave off the aging process.  We want weight loss that will last so you should lose 1-2 pounds a week.  THAT IS IT! Please pick THREE things a month to change. Once it is a habit check off the item. Then pick another three items off the list to become habits.  If you are already doing a habit on the list GREAT!  Cross that item off! o Don't drink your calories. Ie, alcohol, soda, fruit juice, and sweet tea.  o Drink more water. Drink a glass when you feel hungry or before each meal.  o Eat breakfast - Complex carb and protein (likeDannon light and fit yogurt, oatmeal, fruit, eggs, [place] bacon). o Measure your cereal.  Eat no more than one cup a day. (ie [place]) o  Eat an apple a day. o Add a vegetable a day. o Try a new vegetable a month. o Use Pam! Stop using oil or butter to cook. o Don't finish your plate or use smaller plates. o Share your dessert. o Eat sugar free Jello for dessert or frozen grapes. o Don't eat 2-3 hours before bed. o Switch to whole wheat bread, pasta, and brown rice. o Make healthier choices when you eat out. No fries! o Pick baked chicken, NOT fried. o Don't forget to SLOW DOWN when you eat. It is not going anywhere.  o Take the stairs. o Park far away in the parking lot o [employer] (or weights) for 10 minutes while watching TV. o Walk at work for 10 minutes during break. o Walk outside 1 time a week with your friend, kids, dog, or significant other. o Start a walking group at [place] the mall as much as you can tolerate.  o Keep a food diary. o Weigh yourself daily. o Walk for 15 minutes 3 days per week. o Cook at home more often and eat  out less.  If life happens and you go back to old habits, it is okay.  Just start over. You can do it!   If you experience chest pain, get short of breath, or tired during the exercise, please stop immediately and inform your doctor.

## 2015-05-12 NOTE — Progress Notes (Signed)
Assessment and Plan:  Hypertension:  -Continue medication, cut propranolol in half to see if BP is better -monitor blood pressure at home.  -Continue DASH diet.   -Reminder to go to the ER if any CP, SOB, nausea, dizziness, severe HA, changes vision/speech, left arm numbness and tingling, and jaw pain.  Cholesterol: -Continue diet and exercise.  -Check cholesterol.   Pre-diabetes: -Continue diet and exercise.  -Check A1C  Vitamin D Def: -check level -continue medications.   Grief -go to see hospice counselors info given  Non-compliance with diet -discussed in depth today she will work on it -goal to lose 10 lbs in 3 months  Continue diet and meds as discussed. Further disposition pending results of labs.  HPI 74 y.o. female  presents for 3 month follow up with hypertension, hyperlipidemia, prediabetes and vitamin D.   Her blood pressure has been controlled at home, today their BP is BP: (!) 116/58 mmHg.   She does not workout. She denies chest pain, shortness of breath, dizziness.   She is on cholesterol medication and denies myalgias. Her cholesterol is at goal. The cholesterol last visit was:   Lab Results  Component Value Date   CHOL 169 02/05/2015   HDL 67 02/05/2015   LDLCALC 77 02/05/2015   TRIG 126 02/05/2015   CHOLHDL 2.5 02/05/2015     She has not been working on diet and exercise for prediabetes, and denies foot ulcerations, hyperglycemia, hypoglycemia , increased appetite, nausea, paresthesia of the feet, polydipsia, polyuria, visual disturbances, vomiting and weight loss. Last A1C in the office was:  Lab Results  Component Value Date   HGBA1C 5.7* 02/05/2015    Patient is on Vitamin D supplement.  Lab Results  Component Value Date   VD25OH 37 02/05/2015      She reports that she has been having a tough time due to several deaths in the family.  She reports that she has been having some mild depression.    Patient does admit to some dizziness from  time to time.    Current Medications:  Current Outpatient Prescriptions on File Prior to Visit  Medication Sig Dispense Refill  . Cholecalciferol (VITAMIN D) 2000 UNITS CAPS Take by mouth. Take 4 capsules by mouth once daily     . Cyanocobalamin 500 MCG/0.1ML SOLN Place 1 mL under the tongue daily.     . diazepam (VALIUM) 2 MG tablet Take 1 tablet (2 mg total) by mouth at bedtime. 90 tablet 3  . estradiol (ESTRACE) 1 MG tablet Take 1 tablet (1 mg total) by mouth daily. 90 tablet 3  . folic acid (FOLVITE) 751 MCG tablet Take 800 mcg by mouth daily.     . furosemide (LASIX) 40 MG tablet Take 1 tablet (40 mg total) by mouth daily. 90 tablet 3  . hydroxychloroquine (PLAQUENIL) 200 MG tablet Take 2 tablets by mouth once daily     . levothyroxine (SYNTHROID, LEVOTHROID) 100 MCG tablet Take 1 tablet (100 mcg total) by mouth daily. 90 tablet 3  . loratadine (CLARITIN) 10 MG tablet Take 10 mg by mouth daily.      . methotrexate (RHEUMATREX) 2.5 MG tablet Take 5 tablets by mouth on Tuesday and 5 tablets by mouth on Wednesday     . pravastatin (PRAVACHOL) 40 MG tablet Take 1 tablet (40 mg total) by mouth daily. 90 tablet 3  . propranolol (INDERAL) 80 MG tablet Take 1 tablet (80 mg total) by mouth 3 (three) times daily. 270 tablet  3  . ranitidine (ZANTAC) 300 MG tablet Take 1 tablet (300 mg total) by mouth at bedtime. 30 tablet 1  . traMADol (ULTRAM) 50 MG tablet Take 1 tablet (50 mg total) by mouth 4 (four) times daily. 360 tablet 1  . triamcinolone cream (KENALOG) 0.1 % Apply 1 application topically 3 (three) times daily. 30 g 0   No current facility-administered medications on file prior to visit.    Medical History:  Past Medical History  Diagnosis Date  . Rheumatoid arthritis(714.0)   . Tremor, essential     right hand  . Environmental allergies   . Hypothyroidism   . Migraines   . Hyperlipidemia   . Heartburn   . Anemia   . Hiatal hernia   . Gastric ulcer   . Cataract   . Rash  october 2012    all over abdomen and breasts  . Hypertension   . Prediabetes     Allergies:  Allergies  Allergen Reactions  . Codeine Nausea Only  . Mysoline [Primidone] Nausea Only and Other (See Comments)    Over-sedated     Review of Systems:  Review of Systems  Constitutional: Positive for malaise/fatigue. Negative for fever and chills.  HENT: Negative for congestion, ear discharge and sore throat.   Eyes: Negative.   Respiratory: Negative for cough, shortness of breath and wheezing.   Cardiovascular: Negative for chest pain, palpitations and leg swelling.  Skin: Negative.   Neurological: Negative for headaches.  Psychiatric/Behavioral: Positive for depression. The patient is nervous/anxious and has insomnia.     Family history- Review and unchanged  Social history- Review and unchanged  Physical Exam: BP 116/58 mmHg  Pulse 56  Temp(Src) 98.2 F (36.8 C) (Temporal)  Resp 18  Ht 5\' 3"  (1.6 m)  Wt 180 lb (81.647 kg)  BMI 31.89 kg/m2 Wt Readings from Last 3 Encounters:  05/12/15 180 lb (81.647 kg)  02/05/15 179 lb 3.2 oz (81.285 kg)  01/26/15 180 lb (81.647 kg)    General Appearance: Well nourished well developed, in no apparent distress. Eyes: PERRLA, EOMs, conjunctiva no swelling or erythema ENT/Mouth: Ear canals normal without obstruction, swelling, erythma, discharge.  TMs normal bilaterally.  Oropharynx moist, clear, without exudate, or postoropharyngeal swelling. Neck: Supple, thyroid normal,no cervical adenopathy  Respiratory: Respiratory effort normal, Breath sounds clear A&P without rhonchi, wheeze, or rale.  No retractions, no accessory usage. Cardio: RRR with no MRGs. Brisk peripheral pulses without edema.  Abdomen: Soft, + BS,  Non tender, no guarding, rebound, hernias, masses. Musculoskeletal: Full ROM, 5/5 strength, Normal gait Skin: Warm, dry without rashes, lesions, ecchymosis.  Neuro: Awake and oriented X 3, Cranial nerves intact. Normal muscle  tone, no cerebellar symptoms. Psych: Normal affect, Insight and Judgment appropriate.    FORCUCCI, Vladislav Axelson, PA-C 3:59 PM [facility]

## 2015-05-13 LAB — HEPATIC FUNCTION PANEL
ALBUMIN: 3.9 g/dL (ref 3.5–5.2)
ALK PHOS: 67 U/L (ref 39–117)
ALT: 10 U/L (ref 0–35)
AST: 18 U/L (ref 0–37)
Bilirubin, Direct: 0.1 mg/dL (ref 0.0–0.3)
Indirect Bilirubin: 0.4 mg/dL (ref 0.2–1.2)
Total Bilirubin: 0.5 mg/dL (ref 0.2–1.2)
Total Protein: 6.1 g/dL (ref 6.0–8.3)

## 2015-05-13 LAB — LIPID PANEL
CHOL/HDL RATIO: 2.2 ratio
Cholesterol: 164 mg/dL (ref 0–200)
HDL: 76 mg/dL (ref >=46)
LDL Cholesterol: 67 mg/dL (ref 0–99)
TRIGLYCERIDES: 103 mg/dL (ref <150)
VLDL: 21 mg/dL (ref 0–40)

## 2015-05-13 LAB — VITAMIN D 25 HYDROXY (VIT D DEFICIENCY, FRACTURES): Vit D, 25-Hydroxy: 39 ng/mL (ref 30–100)

## 2015-05-13 LAB — BASIC METABOLIC PANEL WITH GFR
BUN: 12 mg/dL (ref 6–23)
CALCIUM: 9 mg/dL (ref 8.4–10.5)
CO2: 32 mEq/L (ref 19–32)
Chloride: 101 mEq/L (ref 96–112)
Creat: 0.89 mg/dL (ref 0.50–1.10)
GFR, EST AFRICAN AMERICAN: 73 mL/min
GFR, EST NON AFRICAN AMERICAN: 63 mL/min
GLUCOSE: 84 mg/dL (ref 70–99)
Potassium: 4.1 mEq/L (ref 3.5–5.3)
SODIUM: 142 meq/L (ref 135–145)

## 2015-05-13 LAB — TSH: TSH: 1.832 u[IU]/mL (ref 0.350–4.500)

## 2015-05-13 LAB — HEMOGLOBIN A1C
Hgb A1c MFr Bld: 5.6 % (ref <5.7)
Mean Plasma Glucose: 114 mg/dL (ref <117)

## 2015-05-13 LAB — MAGNESIUM: Magnesium: 2.1 mg/dL (ref 1.5–2.5)

## 2015-05-13 LAB — INSULIN, RANDOM: Insulin: 7.1 u[IU]/mL (ref 2.0–19.6)

## 2015-05-18 ENCOUNTER — Telehealth: Payer: Self-pay | Admitting: Internal Medicine

## 2015-05-18 MED ORDER — MONTELUKAST SODIUM 10 MG PO TABS: 10.0000 mg | ORAL_TABLET | Freq: Every day | ORAL | Status: DC

## 2015-05-18 NOTE — Telephone Encounter (Signed)
Patient called inquiring about singulair prescription which was supposed to be sent in after visit to the office.  I have sent the prescription in.  Patient should take once daily for severe allergies.

## 2015-05-18 NOTE — Telephone Encounter (Signed)
Patient aware.

## 2015-06-02 ENCOUNTER — Other Ambulatory Visit: Payer: Self-pay | Admitting: Internal Medicine

## 2015-08-08 ENCOUNTER — Other Ambulatory Visit: Payer: Self-pay | Admitting: Internal Medicine

## 2015-08-12 ENCOUNTER — Ambulatory Visit (INDEPENDENT_AMBULATORY_CARE_PROVIDER_SITE_OTHER): Payer: PPO | Admitting: Internal Medicine

## 2015-08-12 ENCOUNTER — Encounter: Payer: Self-pay | Admitting: Internal Medicine

## 2015-08-12 VITALS — BP 114/60 | HR 56 | Temp 97.6°F | Resp 16 | Ht 63.0 in | Wt 174.0 lb

## 2015-08-12 DIAGNOSIS — R7303 Prediabetes: Secondary | ICD-10-CM

## 2015-08-12 DIAGNOSIS — E785 Hyperlipidemia, unspecified: Secondary | ICD-10-CM

## 2015-08-12 DIAGNOSIS — E559 Vitamin D deficiency, unspecified: Secondary | ICD-10-CM

## 2015-08-12 DIAGNOSIS — Z6830 Body mass index (BMI) 30.0-30.9, adult: Secondary | ICD-10-CM | POA: Diagnosis not present

## 2015-08-12 DIAGNOSIS — Z79899 Other long term (current) drug therapy: Secondary | ICD-10-CM | POA: Diagnosis not present

## 2015-08-12 DIAGNOSIS — R7309 Other abnormal glucose: Secondary | ICD-10-CM | POA: Diagnosis not present

## 2015-08-12 DIAGNOSIS — I10 Essential (primary) hypertension: Secondary | ICD-10-CM

## 2015-08-12 DIAGNOSIS — M069 Rheumatoid arthritis, unspecified: Secondary | ICD-10-CM | POA: Diagnosis not present

## 2015-08-12 DIAGNOSIS — E039 Hypothyroidism, unspecified: Secondary | ICD-10-CM

## 2015-08-12 LAB — CBC WITH DIFFERENTIAL/PLATELET
BASOS PCT: 1 % (ref 0–1)
Basophils Absolute: 0 10*3/uL (ref 0.0–0.1)
Eosinophils Absolute: 0.2 10*3/uL (ref 0.0–0.7)
Eosinophils Relative: 4 % (ref 0–5)
HCT: 35.8 % — ABNORMAL LOW (ref 36.0–46.0)
HEMOGLOBIN: 11.5 g/dL — AB (ref 12.0–15.0)
LYMPHS PCT: 41 % (ref 12–46)
Lymphs Abs: 1.7 10*3/uL (ref 0.7–4.0)
MCH: 30.5 pg (ref 26.0–34.0)
MCHC: 32.1 g/dL (ref 30.0–36.0)
MCV: 95 fL (ref 78.0–100.0)
MONOS PCT: 15 % — AB (ref 3–12)
MPV: 9.6 fL (ref 8.6–12.4)
Monocytes Absolute: 0.6 10*3/uL (ref 0.1–1.0)
NEUTROS ABS: 1.6 10*3/uL — AB (ref 1.7–7.7)
NEUTROS PCT: 39 % — AB (ref 43–77)
Platelets: 245 10*3/uL (ref 150–400)
RBC: 3.77 MIL/uL — ABNORMAL LOW (ref 3.87–5.11)
RDW: 15.7 % — ABNORMAL HIGH (ref 11.5–15.5)
WBC: 4.1 10*3/uL (ref 4.0–10.5)

## 2015-08-12 NOTE — Patient Instructions (Signed)

## 2015-08-12 NOTE — Progress Notes (Signed)
Patient ID: Dana [name], female   DOB: [birth year], 74 y.o.   MRN: [id number]   This very nice 74 y.o. [facility] presents for 3 month follow up with Hypertension, Hyperlipidemia, Pre-Diabetes and Vitamin D Deficiency. Patient has been treated for Rheumatoid Arthritis since 2001 and is followed at [facility] and is seen by Dr Gus Puma She does report occasional pains in her hands, but is otherwise felt to be in remission on current medicines.    Patient is treated for HTN  Since 1998 & BP has been controlled at home. Today's BP: 114/60 mmHg. Patient has had no complaints of any cardiac type chest pain, palpitations, dyspnea/orthopnea/PND, dizziness, claudication, or dependent edema.   Hyperlipidemia is controlled with diet & meds. Patient denies myalgias or other med SE's. Last Lipids were at goal - Cholesterol 164; HDL 76; LDL 67; Triglycerides 103 on 05/12/2015.   Also, the patient has history of PreDiabetes since 2012 with A1c 5.7% and has had no symptoms of reactive hypoglycemia, diabetic polys, paresthesias or visual blurring.  Last A1c was 5.6% on 05/12/2015.   Further, the patient also has history of Vitamin D Deficiency of 17 in 2009 and supplements vitamin D without any suspected side-effects. Last vitamin D was still low at 39 on 05/12/2015.   Medication Sig  . Cholecalciferol (VITAMIN D) 2000 UNITS CAPS Take by mouth. Take 4 capsules by mouth once daily   . Cyanocobalamin 500 MCG/0.1ML SOLN Place 1 mL under the tongue daily.   . diazepam (VALIUM) 2 MG tablet Take 1 tablet (2 mg total) by mouth at bedtime.  Marland Kitchen estradiol (ESTRACE) 1 MG tablet Take 1 tablet (1 mg total) by mouth daily.  . folic acid (FOLVITE) 025 MCG tablet Take 800 mcg by mouth daily.   . furosemide (LASIX) 40 MG tablet Take 1 tablet (40 mg total) by mouth daily.  . hydroxychloroquine (PLAQUENIL) 200 MG  Take 2 tablets by mouth once daily   . levothyroxine  100 MCG tablet Take 1 tablet (100 mcg total) by mouth daily.  .  montelukast (SINGULAIR) 10 MG tablet TAKE ONE TABLET BY MOUTH ONCE DAILY  . pravastatin (PRAVACHOL) 40 MG tablet Take 1 tablet (40 mg total) by mouth daily.  . propranolol (INDERAL) 80 MG tablet Take 1 tablet (80 mg total) by mouth 3 (three) times daily.  . ranitidine (ZANTAC) 300 MG tablet TAKE ONE TABLET BY MOUTH AT BEDTIME  . traMADol (ULTRAM) 50 MG tablet Take 1 tablet (50 mg total) by mouth 4 (four) times daily.  . methotrexate (RHEUMATREX) 2.5 MG tablet Take 5 tablets by mouth on Tuesday and 5 tablets by mouth on Wednesday   . loratadine (CLARITIN) 10 MG tablet Take 10 mg by mouth daily.    Marland Kitchen triamcinolone cream (KENALOG) 0.1 % Apply 1 application topically 3 (three) times daily.   Allergies  Allergen Reactions  . Codeine Nausea Only  . Mysoline [Primidone] Nausea Only and Other (See Comments)    Over-sedated   PMHx:   Past Medical History  Diagnosis Date  . Rheumatoid arthritis(714.0)   . Tremor, essential     right hand  . Environmental allergies   . Hypothyroidism   . Migraines   . Hyperlipidemia   . Heartburn   . Anemia   . Hiatal hernia   . Gastric ulcer   . Cataract   . Rash october 2012    all over abdomen and breasts  . Hypertension   . Prediabetes  Immunization History  Administered Date(s) Administered  . DT 02/05/2015  . Influenza Split 09/12/2013  . Influenza, High Dose Seasonal PF 10/28/2014  . Pneumococcal Conjugate-13 05/12/2015  . Pneumococcal-Unspecified 11/21/2006  . Td 11/22/2003  . Varicella 06/23/2011   Past Surgical History  Procedure Laterality Date  . Abdominal hysterectomy    . Tubal ligation    . Cataract extraction, bilateral    . Cholecystectomy  08/31/11    lap chole   . Colonoscopy     FHx:    Reviewed / unchanged  SHx:    Reviewed / unchanged  Systems Review:  Constitutional: Denies fever, chills, wt changes, headaches, insomnia, fatigue, night sweats, change in appetite. Eyes: Denies redness, blurred vision, diplopia,  discharge, itchy, watery eyes.  ENT: Denies discharge, congestion, post nasal drip, epistaxis, sore throat, earache, hearing loss, dental pain, tinnitus, vertigo, sinus pain, snoring.  CV: Denies chest pain, palpitations, irregular heartbeat, syncope, dyspnea, diaphoresis, orthopnea, PND, claudication or edema. Respiratory: denies cough, dyspnea, DOE, pleurisy, hoarseness, laryngitis, wheezing.  Gastrointestinal: Denies dysphagia, odynophagia, heartburn, reflux, water brash, abdominal pain or cramps, nausea, vomiting, bloating, diarrhea, constipation, hematemesis, melena, hematochezia  or hemorrhoids. Genitourinary: Denies dysuria, frequency, urgency, nocturia, hesitancy, discharge, hematuria or flank pain. Musculoskeletal: Denies arthralgias, myalgias, stiffness, jt. swelling, pain, limping or strain/sprain.  Skin: Denies pruritus, rash, hives, warts, acne, eczema or change in skin lesion(s). Neuro: No weakness, tremor, incoordination, spasms, paresthesia or pain. Psychiatric: Denies confusion, memory loss or sensory loss. Endo: Denies change in weight, skin or hair change.  Heme/Lymph: No excessive bleeding, bruising or enlarged lymph nodes.  Physical Exam  BP 114/60   Pulse 56  Temp 97.6 F   Resp 16  Ht 5\' 3"    Wt 174 lb     BMI 30.83   Appears well nourished and in no distress. Eyes: PERRLA, EOMs, conjunctiva no swelling or erythema. Sinuses: No frontal/maxillary tenderness ENT/Mouth: EAC's clear, TM's nl w/o erythema, bulging. Nares clear w/o erythema, swelling, exudates. Oropharynx clear without erythema or exudates. Oral hygiene is good. Tongue normal, non obstructing. Hearing intact.  Neck: Supple. Thyroid nl. Car 2+/2+ without bruits, nodes or JVD. Chest: Respirations nl with BS clear & equal w/o rales, rhonchi, wheezing or stridor.  Cor: Heart sounds normal w/ regular rate and rhythm without sig. murmurs, gallops, clicks, or rubs. Peripheral pulses normal and equal  without  edema.  Abdomen: Soft & bowel sounds normal. Non-tender w/o guarding, rebound, hernias, masses, or organomegaly.  Lymphatics: Unremarkable.  Musculoskeletal: Full ROM all peripheral extremities, joint stability, 5/5 strength, and normal gait.  Skin: Warm, dry without exposed rashes, lesions or ecchymosis apparent.  Neuro: Cranial nerves intact, reflexes equal bilaterally. Sensory-motor testing grossly intact. Tendon reflexes grossly intact.  Pysch: Alert & oriented x 3.  Insight and judgement nl & appropriate. No ideations.  Assessment and Plan:  1. Essential hypertension  - TSH  2. Hyperlipidemia  - Lipid panel  3. Prediabetes  - Hemoglobin A1c - Insulin, random  4. Vitamin D deficiency  - Vit D  25 hydroxy  5. Rheumatoid arthritis   6. Hypothyroidism   7. BMI 30.0-30.9,adult   8. Medication management  - CBC with Differential/Platelet - BASIC METABOLIC PANEL WITH GFR - Hepatic function panel - Magnesium   Recommended regular exercise, BP monitoring, weight control, and discussed med and SE's. Recommended labs to assess and monitor clinical status. Further disposition pending results of labs. Over 30 minutes of exam, counseling, chart review was performed

## 2015-08-13 LAB — HEMOGLOBIN A1C
Hgb A1c MFr Bld: 5.5 % (ref <5.7)
Mean Plasma Glucose: 111 mg/dL (ref <117)

## 2015-08-13 LAB — BASIC METABOLIC PANEL WITH GFR
BUN: 11 mg/dL (ref 7–25)
CHLORIDE: 103 mmol/L (ref 98–110)
CO2: 33 mmol/L — AB (ref 20–31)
CREATININE: 0.84 mg/dL (ref 0.60–0.93)
Calcium: 9.1 mg/dL (ref 8.6–10.4)
GFR, EST NON AFRICAN AMERICAN: 68 mL/min (ref >=60)
GFR, Est African American: 78 mL/min (ref >=60)
Glucose, Bld: 77 mg/dL (ref 65–99)
Potassium: 4.3 mmol/L (ref 3.5–5.3)
Sodium: 141 mmol/L (ref 135–146)

## 2015-08-13 LAB — HEPATIC FUNCTION PANEL
ALT: 11 U/L (ref 6–29)
AST: 18 U/L (ref 10–35)
Albumin: 3.6 g/dL (ref 3.6–5.1)
Alkaline Phosphatase: 67 U/L (ref 33–130)
BILIRUBIN DIRECT: 0.1 mg/dL (ref <=0.2)
BILIRUBIN INDIRECT: 0.3 mg/dL (ref 0.2–1.2)
Total Bilirubin: 0.4 mg/dL (ref 0.2–1.2)
Total Protein: 5.9 g/dL — ABNORMAL LOW (ref 6.1–8.1)

## 2015-08-13 LAB — LIPID PANEL
Cholesterol: 148 mg/dL (ref 125–200)
HDL: 66 mg/dL (ref >=46)
LDL CALC: 61 mg/dL (ref <130)
Total CHOL/HDL Ratio: 2.2 Ratio (ref <=5.0)
Triglycerides: 106 mg/dL (ref <150)
VLDL: 21 mg/dL (ref <30)

## 2015-08-13 LAB — VITAMIN D 25 HYDROXY (VIT D DEFICIENCY, FRACTURES): VIT D 25 HYDROXY: 40 ng/mL (ref 30–100)

## 2015-08-13 LAB — INSULIN, RANDOM: INSULIN: 8.4 u[IU]/mL (ref 2.0–19.6)

## 2015-08-13 LAB — TSH: TSH: 2.375 u[IU]/mL (ref 0.350–4.500)

## 2015-08-13 LAB — MAGNESIUM: Magnesium: 2.2 mg/dL (ref 1.5–2.5)

## 2015-09-02 DIAGNOSIS — M159 Polyosteoarthritis, unspecified: Secondary | ICD-10-CM | POA: Insufficient documentation

## 2015-09-02 DIAGNOSIS — Z79899 Other long term (current) drug therapy: Secondary | ICD-10-CM | POA: Insufficient documentation

## 2015-09-30 ENCOUNTER — Other Ambulatory Visit: Payer: Self-pay

## 2015-09-30 MED ORDER — DIAZEPAM 2 MG PO TABS: 2.0000 mg | ORAL_TABLET | Freq: Every day | ORAL | Status: DC

## 2015-09-30 MED ORDER — PRAVASTATIN SODIUM 40 MG PO TABS: 40.0000 mg | ORAL_TABLET | Freq: Every day | ORAL | Status: DC

## 2015-11-11 ENCOUNTER — Encounter: Payer: Self-pay | Admitting: Physician Assistant

## 2015-11-11 ENCOUNTER — Ambulatory Visit (INDEPENDENT_AMBULATORY_CARE_PROVIDER_SITE_OTHER): Payer: PPO | Admitting: Physician Assistant

## 2015-11-11 VITALS — BP 128/70 | HR 59 | Temp 97.7°F | Resp 14 | Ht 63.5 in | Wt 173.0 lb

## 2015-11-11 DIAGNOSIS — R05 Cough: Secondary | ICD-10-CM

## 2015-11-11 DIAGNOSIS — I10 Essential (primary) hypertension: Secondary | ICD-10-CM

## 2015-11-11 DIAGNOSIS — E559 Vitamin D deficiency, unspecified: Secondary | ICD-10-CM

## 2015-11-11 DIAGNOSIS — E039 Hypothyroidism, unspecified: Secondary | ICD-10-CM

## 2015-11-11 DIAGNOSIS — G250 Essential tremor: Secondary | ICD-10-CM

## 2015-11-11 DIAGNOSIS — R059 Cough, unspecified: Secondary | ICD-10-CM

## 2015-11-11 DIAGNOSIS — G43809 Other migraine, not intractable, without status migrainosus: Secondary | ICD-10-CM

## 2015-11-11 DIAGNOSIS — R7309 Other abnormal glucose: Secondary | ICD-10-CM

## 2015-11-11 DIAGNOSIS — D649 Anemia, unspecified: Secondary | ICD-10-CM

## 2015-11-11 DIAGNOSIS — E785 Hyperlipidemia, unspecified: Secondary | ICD-10-CM

## 2015-11-11 DIAGNOSIS — M069 Rheumatoid arthritis, unspecified: Secondary | ICD-10-CM

## 2015-11-11 DIAGNOSIS — Z79899 Other long term (current) drug therapy: Secondary | ICD-10-CM

## 2015-11-11 LAB — CBC WITH DIFFERENTIAL/PLATELET
BASOS ABS: 0.1 10*3/uL (ref 0.0–0.1)
BASOS PCT: 1 % (ref 0–1)
EOS ABS: 0.1 10*3/uL (ref 0.0–0.7)
EOS PCT: 2 % (ref 0–5)
HCT: 36.9 % (ref 36.0–46.0)
Hemoglobin: 12.3 g/dL (ref 12.0–15.0)
Lymphocytes Relative: 35 % (ref 12–46)
Lymphs Abs: 1.9 10*3/uL (ref 0.7–4.0)
MCH: 31.5 pg (ref 26.0–34.0)
MCHC: 33.3 g/dL (ref 30.0–36.0)
MCV: 94.6 fL (ref 78.0–100.0)
MPV: 9.7 fL (ref 8.6–12.4)
Monocytes Absolute: 0.7 10*3/uL (ref 0.1–1.0)
Monocytes Relative: 14 % — ABNORMAL HIGH (ref 3–12)
NEUTROS PCT: 48 % (ref 43–77)
Neutro Abs: 2.5 10*3/uL (ref 1.7–7.7)
PLATELETS: 234 10*3/uL (ref 150–400)
RBC: 3.9 MIL/uL (ref 3.87–5.11)
RDW: 16.2 % — AB (ref 11.5–15.5)
WBC: 5.3 10*3/uL (ref 4.0–10.5)

## 2015-11-11 NOTE — Progress Notes (Signed)
Complete Physical  Assessment and Plan: 1. Essential hypertension - continue medications, DASH diet, exercise and monitor at home. Call if greater than 130/80.  - CBC with Differential/Platelet - BASIC METABOLIC PANEL WITH GFR - Hepatic function panel  2. Hypothyroidism, unspecified hypothyroidism type Hypothyroidism-check TSH level, continue medications the same, reminded to take on an empty stomach 30-58mins before food.  - TSH  3. Other abnormal glucose Discussed general issues about diabetes pathophysiology and management., Educational material distributed., Suggested low cholesterol diet., Encouraged aerobic exercise., Discussed foot care., Reminded to get yearly retinal exam. - Hemoglobin A1c  4. Hyperlipidemia - Lipid panel  5. Anemia, unspecified anemia type - monitor, continue iron supp with Vitamin C and increase green leafy veggies  6. Rheumatoid arthritis involving multiple joints (HCC) Follow with [facility]  7. Tremor, essential Continue propanolol  8. Medication management - Magnesium  9. Other migraine without status migrainosus, not intractable controlled  10. Cough Lungs CTAB, has had smoking history get CXR, add on PPI for 2 weeks, if not better may need swallow study - DG Chest 2 View; Future  11. Vitamin D deficiency - VITAMIN D 25 Hydroxy (Vit-D Deficiency, Fractures)  Discussed med's effects and SE's. Screening labs and tests as requested with regular follow-up as recommended. Over 40 minutes of exam, counseling, chart review, and complex, high level critical decision making was performed this visit.   HPI  74 y.o. female  presents for a complete physical.  Her blood pressure has been controlled at home, today their BP is BP: 128/70 mmHg She does workout. She denies chest pain, shortness of breath, dizziness.  She is on cholesterol medication and denies myalgias. Her cholesterol is at goal. The cholesterol last visit was:   Lab Results   Component Value Date   CHOL 148 08/12/2015   HDL 66 08/12/2015   LDLCALC 61 08/12/2015   TRIG 106 08/12/2015   CHOLHDL 2.2 08/12/2015   She has been working on diet and exercise for prediabetes,  and denies paresthesia of the feet, polydipsia, polyuria and visual disturbances. Last A1C in the office was:  Lab Results  Component Value Date   HGBA1C 5.5 08/12/2015   She is on thyroid medication. Her medication was not changed last visit.   Lab Results  Component Value Date   TSH 2.375 08/12/2015  RA and follows with [facility], on methotrexate and plaquenil.  She complains of some pressure behind her knees and knee pain.  She has essential tremor, on propanolol.  She is on zantac 300mg  BId for GERd.  Last GFR: Lab Results  Component Value Date   GFRNONAA 68 08/12/2015  Patient is on Vitamin D supplement.   Lab Results  Component Value Date   VD25OH 40 08/12/2015    BMI is Body mass index is 30.16 kg/(m^2)., she is working on diet and exercise. Wt Readings from Last 3 Encounters:  11/11/15 173 lb (78.472 kg)  08/12/15 174 lb (78.926 kg)  05/12/15 180 lb (81.647 kg)   Current Medications:  Current Outpatient Prescriptions on File Prior to Visit  Medication Sig Dispense Refill  . Capsaicin-Menthol-Methyl Sal (CAPSAICIN-METHYL SAL-MENTHOL) 0.025-1-12 % CREA Apply over the affected joints once daily as needed    . Cholecalciferol (VITAMIN D) 2000 UNITS CAPS Take by mouth. Take 4 capsules by mouth once daily     . Cyanocobalamin 500 MCG/0.1ML SOLN Place 1 mL under the tongue daily.     . diazepam (VALIUM) 2 MG tablet Take 1 tablet (2 mg  total) by mouth at bedtime. 90 tablet 3  . estradiol (ESTRACE) 1 MG tablet Take 1 tablet (1 mg total) by mouth daily. 90 tablet 3  . folic acid (FOLVITE) Q000111Q MCG tablet Take 800 mcg by mouth daily.     . furosemide (LASIX) 40 MG tablet Take 1 tablet (40 mg total) by mouth daily. 90 tablet 3  . hydroxychloroquine (PLAQUENIL) 200 MG tablet Take 2  tablets by mouth once daily     . levothyroxine (SYNTHROID, LEVOTHROID) 100 MCG tablet Take 1 tablet (100 mcg total) by mouth daily. 90 tablet 3  . methotrexate (RHEUMATREX) 2.5 MG tablet TAKE 10 TABLETS BY MOUTH ONCE A WEEK    . montelukast (SINGULAIR) 10 MG tablet TAKE ONE TABLET BY MOUTH ONCE DAILY 90 tablet 1  . pravastatin (PRAVACHOL) 40 MG tablet Take 1 tablet (40 mg total) by mouth daily. 90 tablet 3  . propranolol (INDERAL) 80 MG tablet Take 1 tablet (80 mg total) by mouth 3 (three) times daily. 270 tablet 3  . ranitidine (ZANTAC) 300 MG tablet TAKE ONE TABLET BY MOUTH AT BEDTIME 30 tablet 2  . traMADol (ULTRAM) 50 MG tablet Take 1 tablet (50 mg total) by mouth 4 (four) times daily. 360 tablet 1  . verapamil (CALAN-SR) 180 MG CR tablet Take 180 mg by mouth.     No current facility-administered medications on file prior to visit.   Health Maintenance:   Immunization History  Administered Date(s) Administered  . DT 02/05/2015  . Influenza Split 09/12/2013  . Influenza, High Dose Seasonal PF 10/28/2014  . Pneumococcal Conjugate-13 05/12/2015  . Pneumococcal-Unspecified 11/21/2006  . Td 11/22/2003  . Varicella 06/23/2011   Tetanus: 2016 Pneumovax:2008 Prevnar 13: 2016 Flu vaccine:2015 DUE Zostavax: 2012 LMP: No LMP recorded. Patient has had a hysterectomy. Pap: remote MGM: 02/2014 DEXA: 2014 Colonoscopy: 2013 EGD: N/A  Last Dental Exam: Dental hygiene Last Eye Exam: Dr. Ellie Lunch 06/2014 Patient Care Team: Unk Pinto, MD as PCP - General (Internal Medicine) Roetta Sessions, MD as Referring Physician (Internal Medicine)  Allergies:  Allergies  Allergen Reactions  . Codeine Nausea Only  . Mysoline [Primidone] Nausea Only and Other (See Comments)    Over-sedated   Medical History:  Past Medical History  Diagnosis Date  . Rheumatoid arthritis(714.0)   . Tremor, essential     right hand  . Environmental allergies   . Hypothyroidism   . Migraines   .  Hyperlipidemia   . Heartburn   . Anemia   . Hiatal hernia   . Gastric ulcer   . Cataract   . Rash october 2012    all over abdomen and breasts  . Hypertension   . Prediabetes    Surgical History:  Past Surgical History  Procedure Laterality Date  . Abdominal hysterectomy    . Tubal ligation    . Cataract extraction, bilateral    . Cholecystectomy  08/31/11    lap chole   . Colonoscopy     Family History:  Family History  Problem Relation Age of Onset  . Colon cancer Neg Hx   . Heart disease Mother   . Diabetes Mother   . Heart disease Maternal Aunt     x3   . Heart disease Maternal Uncle     x 4  . Diabetes Sister     borderline  . Heart disease Father    Social History:  Social History  Substance Use Topics  . Smoking status: Former Smoker  Types: Cigarettes    Quit date: 09/18/1984  . Smokeless tobacco: Never Used     Comment: stopped around 25 years ago  . Alcohol Use: No    Review of Systems: Review of Systems  Constitutional: Positive for malaise/fatigue. Negative for fever and chills.  HENT: Negative for congestion, ear discharge and sore throat.   Eyes: Negative.   Respiratory: Negative for cough, shortness of breath and wheezing.   Cardiovascular: Negative for chest pain, palpitations and leg swelling.  Skin: Negative.   Neurological: Negative for headaches.  Psychiatric/Behavioral: Positive for depression. The patient is nervous/anxious and has insomnia.     Physical Exam: Estimated body mass index is 30.16 kg/(m^2) as calculated from the following:   Height as of this encounter: 5' 3.5" (1.613 m).   Weight as of this encounter: 173 lb (78.472 kg). BP 128/70 mmHg  Pulse 59  Resp 14  Ht 5' 3.5" (1.613 m)  Wt 173 lb (78.472 kg)  BMI 30.16 kg/m2  SpO2 99% General Appearance: Well nourished, in no apparent distress. Eyes: PERRLA, EOMs, conjunctiva no swelling or erythema Sinuses: No Frontal/maxillary tenderness ENT/Mouth: Ext aud canals  clear, TMs without erythema, bulging. No erythema, swelling, or exudate on post pharynx.  Tonsils not swollen or erythematous. Hearing normal.  Neck: Supple, thyroid normal.  Respiratory: Respiratory effort normal, BS equal bilaterally without rales, rhonchi, wheezing or stridor.  Cardio: RRR with no MRGs. Brisk peripheral pulses without edema.  Abdomen: Soft, + BS, epigastric Non tender, no guarding, rebound, hernias, masses. Lymphatics: Non tender without lymphadenopathy.  Musculoskeletal: Full ROM, 5/5 strength, normal gait.  Skin: Warm, dry without rashes, lesions, ecchymosis.  Neuro: Cranial nerves intact. Normal muscle tone, no cerebellar symptoms. Sensation intact.  Psych: Awake and oriented X 3, normal affect, Insight and Judgment appropriate.    EKG: another time AORTA SCAN: another time  Vicie Mutters 3:40 PM [facility]

## 2015-11-11 NOTE — Patient Instructions (Addendum)
For the cough, will get chest xray I want you to get prilosec or omeprazole over the counter and take in the morning with zantac at night for 2 weeks, if it not better will get swallow study   Preventive Care for Adults A healthy lifestyle and preventive care can promote health and wellness. Preventive health guidelines for women include the following key practices.  A routine yearly physical is a good way to check with your health care provider about your health and preventive screening. It is a chance to share any concerns and updates on your health and to receive a thorough exam.  Visit your dentist for a routine exam and preventive care every 6 months. Brush your teeth twice a day and floss once a day. Good oral hygiene prevents tooth decay and gum disease.  The frequency of eye exams is based on your age, health, family medical history, use of contact lenses, and other factors. Follow your health care provider's recommendations for frequency of eye exams.  Eat a healthy diet. Foods like vegetables, fruits, whole grains, low-fat dairy products, and lean protein foods contain the nutrients you need without too many calories. Decrease your intake of foods high in solid fats, added sugars, and salt. Eat the right amount of calories for you.Get information about a proper diet from your health care provider, if necessary.  Regular physical exercise is one of the most important things you can do for your health. Most adults should get at least 150 minutes of moderate-intensity exercise (any activity that increases your heart rate and causes you to sweat) each week. In addition, most adults need muscle-strengthening exercises on 2 or more days a week.  Maintain a healthy weight. The body mass index (BMI) is a screening tool to identify possible weight problems. It provides an estimate of body fat based on height and weight. Your health care provider can find [name] and can help you achieve or  maintain a healthy weight.For adults 20 years and older:  A BMI below 18.5 is considered underweight.  A BMI of 18.5 to 24.9 is normal.  A BMI of 25 to 29.9 is considered overweight.  A BMI of 30 and above is considered obese.  Maintain normal blood lipids and cholesterol levels by exercising and minimizing your intake of saturated fat. Eat a balanced diet with plenty of fruit and vegetables. If your lipid or cholesterol levels are high, you are over 50, or you are at high risk for heart disease, you may need your cholesterol levels checked more frequently.Ongoing high lipid and cholesterol levels should be treated with medicines if diet and exercise are not working.  If you smoke, find out from your health care provider how to quit. If you do not use tobacco, do not start.  Lung cancer screening is recommended for adults aged 63-80 years who are at high risk for developing lung cancer because of a history of smoking. A yearly low-dose CT scan of the lungs is recommended for people who have at least a 30-pack-year history of smoking and are a current smoker or have quit within the past 15 years. A pack year of smoking is smoking an average of 1 pack of cigarettes a day for 1 year (for example: 1 pack a day for 30 years or 2 packs a day for 15 years). Yearly screening should continue until the smoker has stopped smoking for at least 15 years. Yearly screening should be stopped for people who develop a health  problem that would prevent them from having lung cancer treatment.  Avoid use of street drugs. Do not share needles with anyone. Ask for help if you need support or instructions about stopping the use of drugs.  High blood pressure causes heart disease and increases the risk of stroke.  Ongoing high blood pressure should be treated with medicines if weight loss and exercise do not work.  If you are 23-17 years old, ask your health care provider if you should take aspirin to prevent  strokes.  Diabetes screening involves taking a blood sample to check your fasting blood sugar level. This should be done once every 3 years, after age 26, if you are within normal weight and without risk factors for diabetes. Testing should be considered at a younger age or be carried out more frequently if you are overweight and have at least 1 risk factor for diabetes.  Breast cancer screening is essential preventive care for women. You should practice "breast self-awareness." This means understanding the normal appearance and feel of your breasts and may include breast self-examination. Any changes detected, no matter how small, should be reported to a health care provider. Women in their 47s and 30s should have a clinical breast exam (CBE) by a health care provider as part of a regular health exam every 1 to 3 years. After age 53, women should have a CBE every year. Starting at age 36, women should consider having a mammogram (breast X-ray test) every year. Women who have a family history of breast cancer should talk to their health care provider about genetic screening. Women at a high risk of breast cancer should talk to their health care providers about having an MRI and a mammogram every year.  Breast cancer gene (BRCA)-related cancer risk assessment is recommended for women who have family members with BRCA-related cancers. BRCA-related cancers include breast, ovarian, tubal, and peritoneal cancers. Having family members with these cancers may be associated with an increased risk for harmful changes (mutations) in the breast cancer genes BRCA1 and BRCA2. Results of the assessment will determine the need for genetic counseling and BRCA1 and BRCA2 testing.  Routine pelvic exams to screen for cancer are no longer recommended for nonpregnant women who are considered low risk for cancer of the pelvic organs (ovaries, uterus, and vagina) and who do not have symptoms. Ask your health care provider if a  screening pelvic exam is right for you.  If you have had past treatment for cervical cancer or a condition that could lead to cancer, you need Pap tests and screening for cancer for at least 20 years after your treatment. If Pap tests have been discontinued, your risk factors (such as having a new sexual partner) need to be reassessed to determine if screening should be resumed. Some women have medical problems that increase the chance of getting cervical cancer. In these cases, your health care provider may recommend more frequent screening and Pap tests.    Colorectal cancer can be detected and often prevented. Most routine colorectal cancer screening begins at the age of 34 years and continues through age 52 years. However, your health care provider may recommend screening at an earlier age if you have risk factors for colon cancer. On a yearly basis, your health care provider may provide home test kits to check for hidden blood in the stool. Use of a small camera at the end of a tube, to directly examine the colon (sigmoidoscopy or colonoscopy), can detect the earliest  forms of colorectal cancer. Talk to your health care provider about this at age 18, when routine screening begins. Direct exam of the colon should be repeated every 5-10 years through age 84 years, unless early forms of pre-cancerous polyps or small growths are found.  Osteoporosis is a disease in which the bones lose minerals and strength with aging. This can result in serious bone fractures or breaks. The risk of osteoporosis can be identified using a bone density scan. Women ages 68 years and over and women at risk for fractures or osteoporosis should discuss screening with their health care providers. Ask your health care provider whether you should take a calcium supplement or vitamin D to reduce the rate of osteoporosis.  Menopause can be associated with physical symptoms and risks. Hormone replacement therapy is available to  decrease symptoms and risks. You should talk to your health care provider about whether hormone replacement therapy is right for you.  Use sunscreen. Apply sunscreen liberally and repeatedly throughout the day. You should seek shade when your shadow is shorter than you. Protect yourself by wearing long sleeves, pants, a wide-brimmed hat, and sunglasses year round, whenever you are outdoors.  Once a month, do a whole body skin exam, using a mirror to look at the skin on your back. Tell your health care provider of new moles, moles that have irregular borders, moles that are larger than a pencil eraser, or moles that have changed in shape or color.  Stay current with required vaccines (immunizations).  Influenza vaccine. All adults should be immunized every year.  Tetanus, diphtheria, and acellular pertussis (Td, Tdap) vaccine. Pregnant women should receive 1 dose of Tdap vaccine during each pregnancy. The dose should be obtained regardless of the length of time since the last dose. Immunization is preferred during the 27th-36th week of gestation. An adult who has not previously received Tdap or who does not know her vaccine status should receive 1 dose of Tdap. This initial dose should be followed by tetanus and diphtheria toxoids (Td) booster doses every 10 years. Adults with an unknown or incomplete history of completing a 3-dose immunization series with Td-containing vaccines should begin or complete a primary immunization series including a Tdap dose. Adults should receive a Td booster every 10 years.    Zoster vaccine. One dose is recommended for adults aged 53 years or older unless certain conditions are present.    Pneumococcal 13-valent conjugate (PCV13) vaccine. When indicated, a person who is uncertain of her immunization history and has no record of immunization should receive the PCV13 vaccine. An adult aged 55 years or older who has certain medical conditions and has not been previously  immunized should receive 1 dose of PCV13 vaccine. This PCV13 should be followed with a dose of pneumococcal polysaccharide (PPSV23) vaccine. The PPSV23 vaccine dose should be obtained at least 8 weeks after the dose of PCV13 vaccine. An adult aged 77 years or older who has certain medical conditions and previously received 1 or more doses of PPSV23 vaccine should receive 1 dose of PCV13. The PCV13 vaccine dose should be obtained 1 or more years after the last PPSV23 vaccine dose.    Pneumococcal polysaccharide (PPSV23) vaccine. When PCV13 is also indicated, PCV13 should be obtained first. All adults aged 85 years and older should be immunized. An adult younger than age 27 years who has certain medical conditions should be immunized. Any person who resides in a nursing home or long-term care facility should be immunized.  An adult smoker should be immunized. People with an immunocompromised condition and certain other conditions should receive both PCV13 and PPSV23 vaccines. People with human immunodeficiency virus (HIV) infection should be immunized as soon as possible after diagnosis. Immunization during chemotherapy or radiation therapy should be avoided. Routine use of PPSV23 vaccine is not recommended for American Indians, [place] Natives, or people younger than 65 years unless there are medical conditions that require PPSV23 vaccine. When indicated, people who have unknown immunization and have no record of immunization should receive PPSV23 vaccine. One-time revaccination 5 years after the first dose of PPSV23 is recommended for people aged 19-64 years who have chronic kidney failure, nephrotic syndrome, asplenia, or immunocompromised conditions. People who received 1-2 doses of PPSV23 before age 54 years should receive another dose of PPSV23 vaccine at age 41 years or later if at least 5 years have passed since the previous dose. Doses of PPSV23 are not needed for people immunized with PPSV23 at or after  age 15 years.   Preventive Services / Frequency  Ages 75 years and over  Blood pressure check.  Lipid and cholesterol check.  Lung cancer screening. / Every year if you are aged 85-80 years and have a 30-pack-year history of smoking and currently smoke or have quit within the past 15 years. Yearly screening is stopped once you have quit smoking for at least 15 years or develop a health problem that would prevent you from having lung cancer treatment.  Clinical breast exam.** / Every year after age 38 years.  BRCA-related cancer risk assessment.** / For women who have family members with a BRCA-related cancer (breast, ovarian, tubal, or peritoneal cancers).  Mammogram.** / Every year beginning at age 60 years and continuing for as long as you are in good health. Consult with your health care provider.  Pap test.** / Every 3 years starting at age 69 years through age 63 or 59 years with 3 consecutive normal Pap tests. Testing can be stopped between 65 and 70 years with 3 consecutive normal Pap tests and no abnormal Pap or HPV tests in the past 10 years.  Fecal occult blood test (FOBT) of stool. / Every year beginning at age 2 years and continuing until age 2 years. You may not need to do this test if you get a colonoscopy every 10 years.  Flexible sigmoidoscopy or colonoscopy.** / Every 5 years for a flexible sigmoidoscopy or every 10 years for a colonoscopy beginning at age 10 years and continuing until age 31 years.  Hepatitis C blood test.** / For all people born from [birth year] through 1967 and any individual with known risks for hepatitis C.  Osteoporosis screening.** / A one-time screening for women ages 21 years and over and women at risk for fractures or osteoporosis.  Skin self-exam. / Monthly.  Influenza vaccine. / Every year.  Tetanus, diphtheria, and acellular pertussis (Tdap/Td) vaccine.** / 1 dose of Td every 10 years.  Zoster vaccine.** / 1 dose for adults aged 55 years  or older.  Pneumococcal 13-valent conjugate (PCV13) vaccine.** / Consult your health care provider.  Pneumococcal polysaccharide (PPSV23) vaccine.** / 1 dose for all adults aged 62 years and older. Screening for abdominal aortic aneurysm (AAA)  by ultrasound is recommended for people who have history of high blood pressure or who are current or former smokers.

## 2015-11-12 LAB — HEPATIC FUNCTION PANEL
ALBUMIN: 3.9 g/dL (ref 3.6–5.1)
ALT: 11 U/L (ref 6–29)
AST: 17 U/L (ref 10–35)
Alkaline Phosphatase: 62 U/L (ref 33–130)
BILIRUBIN INDIRECT: 0.4 mg/dL (ref 0.2–1.2)
Bilirubin, Direct: 0.1 mg/dL (ref <=0.2)
TOTAL PROTEIN: 5.9 g/dL — AB (ref 6.1–8.1)
Total Bilirubin: 0.5 mg/dL (ref 0.2–1.2)

## 2015-11-12 LAB — BASIC METABOLIC PANEL WITH GFR
BUN: 13 mg/dL (ref 7–25)
CALCIUM: 8.9 mg/dL (ref 8.6–10.4)
CO2: 31 mmol/L (ref 20–31)
CREATININE: 0.93 mg/dL (ref 0.60–0.93)
Chloride: 102 mmol/L (ref 98–110)
GFR, EST AFRICAN AMERICAN: 69 mL/min (ref >=60)
GFR, EST NON AFRICAN AMERICAN: 60 mL/min (ref >=60)
GLUCOSE: 78 mg/dL (ref 65–99)
POTASSIUM: 4.4 mmol/L (ref 3.5–5.3)
SODIUM: 141 mmol/L (ref 135–146)

## 2015-11-12 LAB — LIPID PANEL
CHOLESTEROL: 157 mg/dL (ref 125–200)
HDL: 71 mg/dL (ref >=46)
LDL Cholesterol: 68 mg/dL (ref <130)
TRIGLYCERIDES: 90 mg/dL (ref <150)
Total CHOL/HDL Ratio: 2.2 Ratio (ref <=5.0)
VLDL: 18 mg/dL (ref <30)

## 2015-11-12 LAB — VITAMIN D 25 HYDROXY (VIT D DEFICIENCY, FRACTURES): VIT D 25 HYDROXY: 41 ng/mL (ref 30–100)

## 2015-11-12 LAB — HEMOGLOBIN A1C
Hgb A1c MFr Bld: 5.5 % (ref <5.7)
MEAN PLASMA GLUCOSE: 111 mg/dL (ref <117)

## 2015-11-12 LAB — MAGNESIUM: MAGNESIUM: 2.1 mg/dL (ref 1.5–2.5)

## 2015-11-12 LAB — TSH: TSH: 2.114 u[IU]/mL (ref 0.350–4.500)

## 2015-12-11 DIAGNOSIS — Z79899 Other long term (current) drug therapy: Secondary | ICD-10-CM | POA: Diagnosis not present

## 2015-12-11 DIAGNOSIS — M069 Rheumatoid arthritis, unspecified: Secondary | ICD-10-CM | POA: Diagnosis not present

## 2015-12-14 ENCOUNTER — Other Ambulatory Visit: Payer: Self-pay | Admitting: Internal Medicine

## 2016-01-19 DIAGNOSIS — Z79899 Other long term (current) drug therapy: Secondary | ICD-10-CM | POA: Diagnosis not present

## 2016-01-28 ENCOUNTER — Other Ambulatory Visit: Payer: Self-pay | Admitting: *Deleted

## 2016-01-28 MED ORDER — PRAVASTATIN SODIUM 40 MG PO TABS: 40.0000 mg | ORAL_TABLET | Freq: Every day | ORAL | Status: DC

## 2016-01-28 MED ORDER — RANITIDINE HCL 300 MG PO TABS: 300.0000 mg | ORAL_TABLET | Freq: Every day | ORAL | Status: DC

## 2016-01-28 MED ORDER — FUROSEMIDE 40 MG PO TABS: 40.0000 mg | ORAL_TABLET | Freq: Every day | ORAL | Status: DC

## 2016-01-28 MED ORDER — MONTELUKAST SODIUM 10 MG PO TABS: 10.0000 mg | ORAL_TABLET | Freq: Every day | ORAL | Status: DC

## 2016-02-01 ENCOUNTER — Other Ambulatory Visit: Payer: Self-pay | Admitting: *Deleted

## 2016-02-01 MED ORDER — FUROSEMIDE 40 MG PO TABS: 40.0000 mg | ORAL_TABLET | Freq: Every day | ORAL | Status: DC

## 2016-02-01 MED ORDER — PRAVASTATIN SODIUM 40 MG PO TABS: 40.0000 mg | ORAL_TABLET | Freq: Every day | ORAL | Status: DC

## 2016-02-01 MED ORDER — RANITIDINE HCL 300 MG PO TABS: 300.0000 mg | ORAL_TABLET | Freq: Every day | ORAL | Status: DC

## 2016-02-01 MED ORDER — MONTELUKAST SODIUM 10 MG PO TABS: 10.0000 mg | ORAL_TABLET | Freq: Every day | ORAL | Status: DC

## 2016-02-10 ENCOUNTER — Other Ambulatory Visit: Payer: Self-pay | Admitting: Internal Medicine

## 2016-02-10 DIAGNOSIS — Z87891 Personal history of nicotine dependence: Secondary | ICD-10-CM | POA: Diagnosis not present

## 2016-02-10 DIAGNOSIS — M059 Rheumatoid arthritis with rheumatoid factor, unspecified: Secondary | ICD-10-CM | POA: Diagnosis not present

## 2016-02-10 DIAGNOSIS — Z79899 Other long term (current) drug therapy: Secondary | ICD-10-CM | POA: Diagnosis not present

## 2016-02-10 DIAGNOSIS — M0579 Rheumatoid arthritis with rheumatoid factor of multiple sites without organ or systems involvement: Secondary | ICD-10-CM | POA: Diagnosis not present

## 2016-02-10 DIAGNOSIS — M150 Primary generalized (osteo)arthritis: Secondary | ICD-10-CM | POA: Diagnosis not present

## 2016-02-10 DIAGNOSIS — M159 Polyosteoarthritis, unspecified: Secondary | ICD-10-CM | POA: Diagnosis not present

## 2016-02-15 ENCOUNTER — Other Ambulatory Visit: Payer: Self-pay | Admitting: *Deleted

## 2016-02-15 MED ORDER — MONTELUKAST SODIUM 10 MG PO TABS: 10.0000 mg | ORAL_TABLET | Freq: Every day | ORAL | Status: DC

## 2016-02-15 MED ORDER — RANITIDINE HCL 300 MG PO TABS: 300.0000 mg | ORAL_TABLET | Freq: Every day | ORAL | Status: DC

## 2016-02-15 MED ORDER — PRAVASTATIN SODIUM 40 MG PO TABS
40.0000 mg | ORAL_TABLET | Freq: Every day | ORAL | Status: DC
Start: 2016-02-15 — End: 2018-01-15

## 2016-02-15 MED ORDER — FUROSEMIDE 40 MG PO TABS: 40.0000 mg | ORAL_TABLET | Freq: Every day | ORAL | Status: DC

## 2016-02-29 ENCOUNTER — Ambulatory Visit: Payer: Self-pay | Admitting: Internal Medicine

## 2016-03-10 ENCOUNTER — Encounter (INDEPENDENT_AMBULATORY_CARE_PROVIDER_SITE_OTHER): Payer: Self-pay

## 2016-03-10 ENCOUNTER — Ambulatory Visit (HOSPITAL_COMMUNITY)
Admission: RE | Admit: 2016-03-10 | Discharge: 2016-03-10 | Disposition: A | Discharge status: Home / Self Care | Payer: Commercial Managed Care - HMO | Source: Ambulatory Visit | Attending: Physician Assistant | Admitting: Physician Assistant

## 2016-03-10 DIAGNOSIS — Z9049 Acquired absence of other specified parts of digestive tract: Secondary | ICD-10-CM | POA: Diagnosis not present

## 2016-03-10 DIAGNOSIS — R059 Cough, unspecified: Secondary | ICD-10-CM

## 2016-03-10 DIAGNOSIS — Z87891 Personal history of nicotine dependence: Secondary | ICD-10-CM | POA: Diagnosis not present

## 2016-03-10 DIAGNOSIS — R05 Cough: Secondary | ICD-10-CM | POA: Diagnosis not present

## 2016-03-13 ENCOUNTER — Other Ambulatory Visit: Payer: Self-pay | Admitting: Internal Medicine

## 2016-03-14 ENCOUNTER — Encounter: Payer: Self-pay | Admitting: Internal Medicine

## 2016-03-14 ENCOUNTER — Ambulatory Visit (INDEPENDENT_AMBULATORY_CARE_PROVIDER_SITE_OTHER): Payer: Commercial Managed Care - HMO | Admitting: Internal Medicine

## 2016-03-14 VITALS — BP 116/60 | HR 56 | Temp 97.9°F | Resp 16 | Ht 63.0 in | Wt 170.4 lb

## 2016-03-14 DIAGNOSIS — I10 Essential (primary) hypertension: Secondary | ICD-10-CM

## 2016-03-14 DIAGNOSIS — Z79899 Other long term (current) drug therapy: Secondary | ICD-10-CM

## 2016-03-14 DIAGNOSIS — E039 Hypothyroidism, unspecified: Secondary | ICD-10-CM

## 2016-03-14 DIAGNOSIS — E785 Hyperlipidemia, unspecified: Secondary | ICD-10-CM

## 2016-03-14 DIAGNOSIS — M069 Rheumatoid arthritis, unspecified: Secondary | ICD-10-CM | POA: Diagnosis not present

## 2016-03-14 DIAGNOSIS — E559 Vitamin D deficiency, unspecified: Secondary | ICD-10-CM | POA: Diagnosis not present

## 2016-03-14 DIAGNOSIS — R7309 Other abnormal glucose: Secondary | ICD-10-CM

## 2016-03-14 LAB — CBC WITH DIFFERENTIAL/PLATELET
Basophils Absolute: 38 cells/uL (ref 0–200)
Basophils Relative: 1 %
EOS ABS: 114 {cells}/uL (ref 15–500)
Eosinophils Relative: 3 %
HCT: 37.8 % (ref 35.0–45.0)
HEMOGLOBIN: 12.3 g/dL (ref 11.7–15.5)
LYMPHS ABS: 1596 {cells}/uL (ref 850–3900)
Lymphocytes Relative: 42 %
MCH: 31.1 pg (ref 27.0–33.0)
MCHC: 32.5 g/dL (ref 32.0–36.0)
MCV: 95.7 fL (ref 80.0–100.0)
MONO ABS: 570 {cells}/uL (ref 200–950)
MPV: 9.9 fL (ref 7.5–12.5)
Monocytes Relative: 15 %
NEUTROS PCT: 39 %
Neutro Abs: 1482 cells/uL — ABNORMAL LOW (ref 1500–7800)
Platelets: 248 10*3/uL (ref 140–400)
RBC: 3.95 MIL/uL (ref 3.80–5.10)
RDW: 15.4 % — ABNORMAL HIGH (ref 11.0–15.0)
WBC: 3.8 10*3/uL (ref 3.8–10.8)

## 2016-03-14 NOTE — Patient Instructions (Signed)

## 2016-03-14 NOTE — Progress Notes (Signed)
Patient ID: Dana [name], female   DOB: 09/12/1941, 75 y.o.   MRN: [id number]     This very nice 75 y.o. [facility] presents for 3 month follow up with Hypertension, Hyperlipidemia, Pre-Diabetes and Vitamin D Deficiency. Other problems include hx/o Rheumatoid Arthritis dx'd in 2001 and is followed by Dr Gus Puma at [facility] in [place]. She has occas aches in her hands and otherwise is felt to be in remission.      Patient is treated for HTN circa 1998 & BP has been controlled at home. Today's BP: 116/60 mmHg. Patient has had no complaints of any cardiac type chest pain, palpitations, dyspnea/orthopnea/PND, dizziness, claudication, or dependent edema.     Hyperlipidemia is controlled with diet & meds. Patient denies myalgias or other med SE's. Last Lipids were at goal with Cholesterol 157; HDL 71; LDL 68; Triglycerides 90 on 11/11/2015.     Also, the patient has history of PreDiabetes circa 2012 with A1c 5.7% and has had no symptoms of reactive hypoglycemia, diabetic polys, paresthesias or visual blurring.  Last A1c was improved at  5.5% on 11/11/2015.      Further, the patient also has history of Vitamin D Deficiency  In 2009 of "17"  and supplements vitamin D without any suspected side-effects. Last vitamin D was 41 on 11/11/2015.  Medication Sig  . CAPSAICIN-METHYL SAL-MENTHOL 0.025-1-12 % CREA Apply over the affected joints once daily as needed  . VITAMIN D 2000 UNITS CAPS Take by mouth. Take 4 capsules by mouth once daily   . Cyanocobalamin 500 MCG SOLN Place 1 mL under the tongue daily.   . diazepam  2 MG tablet Take 1 tablet (2 mg total) by mouth at bedtime.  Marland Kitchen estradiol  1 MG tablet Take 1 tablet (1 mg total) by mouth daily.  . folic acid  Q000111Q MCG tablet Take 800 mcg by mouth daily.   . furosemide  40 MG tablet Take 1 tablet (40 mg total) by mouth daily.  Marland Kitchen PLAQUENIL 200 MG tablet Take 2 tablets by mouth once daily   . levothyroxine  100 MCG tablet TAKE ONE TABLET BY MOUTH ONCE DAILY  . methotrexate   2.5 MG tablet TAKE 10 TABLETS BY MOUTH ONCE A WEEK  . montelukast  10 MG tablet Take 1 tablet (10 mg total) by mouth daily.  . pravastatin  40 MG tablet Take 1 tablet (40 mg total) by mouth daily.  . propranolol  80 MG tablet TAKE ONE TABLET BY MOUTH THREE TIMES DAILY  . ranitidine  300 MG tablet Take 1 tablet (300 mg total) by mouth at bedtime.  . traMADol  50 MG tablet Take 1 tablet (50 mg total) by mouth 4 (four) times daily.  . verapamil ( 180 MG CR  Take 180 mg by mouth.   Allergies  Allergen Reactions  . Codeine Nausea Only  . Mysoline [Primidone] Nausea Only and Other (See Comments)    Over-sedated   PMHx:   Past Medical History  Diagnosis Date  . Rheumatoid arthritis(714.0)   . Tremor, essential     right hand  . Environmental allergies   . Hypothyroidism   . Migraines   . Hyperlipidemia   . Heartburn   . Anemia   . Hiatal hernia   . Gastric ulcer   . Cataract   . Rash october 2012    all over abdomen and breasts  . Hypertension   . Prediabetes    Immunization History  Administered Date(s) Administered  .  DT 02/05/2015  . Influenza Split 09/12/2013  . Influenza, High Dose Seasonal PF 10/28/2014  . Pneumococcal Conjugate-13 05/12/2015  . Pneumococcal-Unspecified 11/21/2006  . Td 11/22/2003  . Varicella 06/23/2011  . Zoster 06/23/2011   Past Surgical History  Procedure Laterality Date  . Abdominal hysterectomy    . Tubal ligation    . Cataract extraction, bilateral    . Cholecystectomy  08/31/11    lap chole   . Colonoscopy     FHx:    Reviewed / unchanged  SHx:    Reviewed / unchanged  Systems Review:  Constitutional: Denies fever, chills, wt changes, headaches, insomnia, fatigue, night sweats, change in appetite. Eyes: Denies redness, blurred vision, diplopia, discharge, itchy, watery eyes.  ENT: Denies discharge, congestion, post nasal drip, epistaxis, sore throat, earache, hearing loss, dental pain, tinnitus, vertigo, sinus pain, snoring.  CV:  Denies chest pain, palpitations, irregular heartbeat, syncope, dyspnea, diaphoresis, orthopnea, PND, claudication or edema. Respiratory: denies cough, dyspnea, DOE, pleurisy, hoarseness, laryngitis, wheezing.  Gastrointestinal: Denies dysphagia, odynophagia, heartburn, reflux, water brash, abdominal pain or cramps, nausea, vomiting, bloating, diarrhea, constipation, hematemesis, melena, hematochezia  or hemorrhoids. Genitourinary: Denies dysuria, frequency, urgency, nocturia, hesitancy, discharge, hematuria or flank pain. Musculoskeletal: Denies arthralgias, myalgias, stiffness, jt. swelling, pain, limping or strain/sprain.  Skin: Denies pruritus, rash, hives, warts, acne, eczema or change in skin lesion(s). Neuro: No weakness, tremor, incoordination, spasms, paresthesia or pain. Psychiatric: Denies confusion, memory loss or sensory loss. Endo: Denies change in weight, skin or hair change.  Heme/Lymph: No excessive bleeding, bruising or enlarged lymph nodes.  Physical Exam  BP 116/60 mmHg  Pulse 56  Temp(Src) 97.9 F (36.6 C)  Resp 16  Ht 5\' 3"  (1.6 m)  Wt 170 lb 6.4 oz (77.293 kg)  BMI 30.19 kg/m2  Appears well nourished and in no distress. Eyes: PERRLA, EOMs, conjunctiva no swelling or erythema. Sinuses: No frontal/maxillary tenderness ENT/Mouth: EAC's clear, TM's nl w/o erythema, bulging. Nares clear w/o erythema, swelling, exudates. Oropharynx clear without erythema or exudates. Oral hygiene is good. Tongue normal, non obstructing. Hearing intact.  Neck: Supple. Thyroid nl. Car 2+/2+ without bruits, nodes or JVD. Chest: Respirations nl with BS clear & equal w/o rales, rhonchi, wheezing or stridor.  Cor: Heart sounds normal w/ regular rate and rhythm without sig. murmurs, gallops, clicks, or rubs. Peripheral pulses normal and equal  without edema.  Abdomen: Soft & bowel sounds normal. Non-tender w/o guarding, rebound, hernias, masses, or organomegaly.  Lymphatics: Unremarkable.   Musculoskeletal: Full ROM all peripheral extremities, joint stability, 5/5 strength, and normal gait.  Skin: Warm, dry without exposed rashes, lesions or ecchymosis apparent.  Neuro: Cranial nerves intact, reflexes equal bilaterally. Sensory-motor testing grossly intact. Tendon reflexes grossly intact.  Pysch: Alert & oriented x 3.  Insight and judgement nl & appropriate. No ideations.  Assessment and Plan:  1. Essential hypertension  - TSH  2. Hyperlipidemia  - Lipid panel - TSH  3. Other abnormal glucose  - Hemoglobin A1c - Insulin, random  4. Vitamin D deficiency  - VITAMIN D 25 Hydroxy   5. Hypothyroidism  - TSH  6. Rheumatoid arthritis  ([place])   7. Medication management  - CBC with Differential/Platelet - BASIC METABOLIC PANEL WITH GFR - Hepatic function panel - Magnesium   Recommended regular exercise, BP monitoring, weight control, and discussed med and SE's. Recommended labs to assess and monitor clinical status. Further disposition pending results of labs. Over 30 minutes of exam, counseling, chart review was performed

## 2016-03-15 LAB — HEPATIC FUNCTION PANEL
ALBUMIN: 3.7 g/dL (ref 3.6–5.1)
ALK PHOS: 58 U/L (ref 33–130)
ALT: 14 U/L (ref 6–29)
AST: 21 U/L (ref 10–35)
BILIRUBIN TOTAL: 0.4 mg/dL (ref 0.2–1.2)
Bilirubin, Direct: 0.1 mg/dL (ref <=0.2)
Indirect Bilirubin: 0.3 mg/dL (ref 0.2–1.2)
TOTAL PROTEIN: 5.5 g/dL — AB (ref 6.1–8.1)

## 2016-03-15 LAB — BASIC METABOLIC PANEL WITH GFR
BUN: 9 mg/dL (ref 7–25)
CO2: 30 mmol/L (ref 20–31)
CREATININE: 0.82 mg/dL (ref 0.60–0.93)
Calcium: 8.9 mg/dL (ref 8.6–10.4)
Chloride: 104 mmol/L (ref 98–110)
GFR, Est African American: 80 mL/min (ref >=60)
GFR, Est Non African American: 69 mL/min (ref >=60)
Glucose, Bld: 77 mg/dL (ref 65–99)
Potassium: 4 mmol/L (ref 3.5–5.3)
Sodium: 145 mmol/L (ref 135–146)

## 2016-03-15 LAB — LIPID PANEL
Cholesterol: 132 mg/dL (ref 125–200)
HDL: 56 mg/dL (ref >=46)
LDL CALC: 51 mg/dL (ref <130)
Total CHOL/HDL Ratio: 2.4 Ratio (ref <=5.0)
Triglycerides: 127 mg/dL (ref <150)
VLDL: 25 mg/dL (ref <30)

## 2016-03-15 LAB — VITAMIN D 25 HYDROXY (VIT D DEFICIENCY, FRACTURES): Vit D, 25-Hydroxy: 41 ng/mL (ref 30–100)

## 2016-03-15 LAB — TSH: TSH: 1.7 m[IU]/L

## 2016-03-15 LAB — MAGNESIUM: Magnesium: 2 mg/dL (ref 1.5–2.5)

## 2016-03-15 LAB — HEMOGLOBIN A1C
Hgb A1c MFr Bld: 5.5 % (ref <5.7)
Mean Plasma Glucose: 111 mg/dL

## 2016-03-15 LAB — INSULIN, RANDOM: INSULIN: 18.4 u[IU]/mL (ref 2.0–19.6)

## 2016-03-25 DIAGNOSIS — X32XXXD Exposure to sunlight, subsequent encounter: Secondary | ICD-10-CM | POA: Diagnosis not present

## 2016-03-25 DIAGNOSIS — L820 Inflamed seborrheic keratosis: Secondary | ICD-10-CM | POA: Diagnosis not present

## 2016-03-25 DIAGNOSIS — L570 Actinic keratosis: Secondary | ICD-10-CM | POA: Diagnosis not present

## 2016-03-25 DIAGNOSIS — C44622 Squamous cell carcinoma of skin of right upper limb, including shoulder: Secondary | ICD-10-CM | POA: Diagnosis not present

## 2016-03-27 ENCOUNTER — Ambulatory Visit (HOSPITAL_COMMUNITY)
Admission: EM | Admit: 2016-03-27 | Discharge: 2016-03-27 | Disposition: A | Discharge status: Home / Self Care | Payer: Commercial Managed Care - HMO | Attending: Emergency Medicine | Admitting: Emergency Medicine

## 2016-03-27 ENCOUNTER — Telehealth (HOSPITAL_COMMUNITY): Payer: Self-pay | Admitting: Nurse Practitioner

## 2016-03-27 ENCOUNTER — Encounter (HOSPITAL_COMMUNITY): Payer: Self-pay | Admitting: Nurse Practitioner

## 2016-03-27 DIAGNOSIS — L089 Local infection of the skin and subcutaneous tissue, unspecified: Secondary | ICD-10-CM

## 2016-03-27 MED ORDER — DOXYCYCLINE HYCLATE 100 MG PO CAPS: 100.0000 mg | ORAL_CAPSULE | Freq: Two times a day (BID) | ORAL | Status: DC

## 2016-03-27 NOTE — ED Notes (Addendum)
Pt had 2 skin cancers removed in an office on Friday. She reports increasing redness and swelling at the sites since. One area to right anterior forearm, one to upper left anterior arm with redness surrounding the wounds. She reports some purulent drainage from the wounds. She reports mild pain. She denies fevers, chills, sweats.

## 2016-03-27 NOTE — ED Notes (Signed)
E-prescribed to [facility] on [address], patient called stating prescription was not available, prescription failed

## 2016-03-27 NOTE — ED Notes (Signed)
Skin marker around reddened wounds, pt educated about monitoring for redness spreaDING beyond skin marker

## 2016-03-27 NOTE — Discharge Instructions (Signed)
Wound Care °Taking care of your wound properly can help to prevent pain and infection. It can also help your wound to heal more quickly.  °HOW TO CARE FOR YOUR WOUND  °· Take or apply over-the-counter and prescription medicines only as told by your health care provider. °· If you were prescribed antibiotic medicine, take or apply it as told by your health care provider. Do not stop using the antibiotic even if your condition improves. °· Clean the wound each day or as told by your health care provider. °¨ Wash the wound with mild soap and water. °¨ Rinse the wound with water to remove all soap. °¨ Pat the wound dry with a clean towel. Do not rub it. °· There are many different ways to close and cover a wound. For example, a wound can be covered with stitches (sutures), skin glue, or adhesive strips. Follow instructions from your health care provider about: °¨ How to take care of your wound. °¨ When and how you should change your bandage (dressing). °¨ When you should remove your dressing. °¨ Removing whatever was used to close your wound. °· Check your wound every day for signs of infection. Watch for: °¨ Redness, swelling, or pain. °¨ Fluid, blood, or pus. °· Keep the dressing dry until your health care provider says it can be removed. Do not take baths, swim, use a hot tub, or do anything that would put your wound underwater until your health care provider approves. °· Raise (elevate) the injured area above the level of your heart while you are sitting or lying down. °· Do not scratch or pick at the wound. °· Keep all follow-up visits as told by your health care provider. This is important. °SEEK MEDICAL CARE IF: °· You received a tetanus shot and you have swelling, severe pain, redness, or bleeding at the injection site. °· You have a fever. °· Your pain is not controlled with medicine. °· You have increased redness, swelling, or pain at the site of your wound. °· You have fluid, blood, or pus coming from your  wound. °· You notice a bad smell coming from your wound or your dressing. °SEEK IMMEDIATE MEDICAL CARE IF: °· You have a red streak going away from your wound. °  °This information is not intended to replace advice given to you by your health care provider. Make sure you discuss any questions you have with your health care provider. °  °Document Released: 08/16/2008 Document Revised: 03/24/2015 Document Reviewed: 11/03/2014 °Elsevier Interactive Patient Education ©2016 [employer] ° °

## 2016-03-27 NOTE — ED Provider Notes (Signed)
CSN: [id number]     Arrival date & time 03/27/16  1302 History   None    Chief Complaint  Patient presents with  . Skin Problem   (Consider location/radiation/quality/duration/timing/severity/associated sxs/prior Treatment)  HPI   Patient is a 75 year old female presenting today with complaints of swelling and drainage from a surgical removal of skin cancer on her left and right arm this past Friday in Dr. Juel Burrow office. Since states she's had no systemic signs or symptoms of infection however is concerned that they are not healing properly.   Past Medical History  Diagnosis Date  . Rheumatoid arthritis(714.0)   . Tremor, essential     right hand  . Environmental allergies   . Hypothyroidism   . Migraines   . Hyperlipidemia   . Heartburn   . Anemia   . Hiatal hernia   . Gastric ulcer   . Cataract   . Rash october 2012    all over abdomen and breasts  . Hypertension   . Prediabetes    Past Surgical History  Procedure Laterality Date  . Abdominal hysterectomy    . Tubal ligation    . Cataract extraction, bilateral    . Cholecystectomy  08/31/11    lap chole   . Colonoscopy     Family History  Problem Relation Age of Onset  . Colon cancer Neg Hx   . Heart disease Mother   . Diabetes Mother   . Heart disease Maternal Aunt     x3   . Heart disease Maternal Uncle     x 4  . Diabetes Sister     borderline  . Heart disease Father    Social History  Substance Use Topics  . Smoking status: Former Smoker    Types: Cigarettes    Quit date: 09/18/1984  . Smokeless tobacco: Never Used     Comment: stopped around 25 years ago  . Alcohol Use: No   OB History    No data available     Review of Systems  Constitutional: Negative.  Negative for fever and fatigue.  HENT: Negative.   Eyes: Negative.  Negative for visual disturbance.  Respiratory: Negative.  Negative for cough and shortness of breath.   Cardiovascular: Negative.  Negative for chest pain and leg  swelling.  Gastrointestinal: Negative.   Endocrine: Negative.   Genitourinary: Negative.   Musculoskeletal: Negative.   Skin: Positive for wound.       Surgical excisions bilateral anterior arms.  See images.   Allergic/Immunologic: Negative.   Neurological: Negative.  Negative for dizziness and headaches.  Hematological: Negative.   Psychiatric/Behavioral: Negative.     Allergies  Codeine and Mysoline  Home Medications   Prior to Admission medications   Medication Sig Start Date End Date Taking? Authorizing Provider  omeprazole (PRILOSEC) 20 MG capsule Take 20 mg by mouth 2 (two) times daily before a meal.   Yes Historical Provider, MD  Cholecalciferol (VITAMIN D) 2000 UNITS CAPS Take by mouth. Take 4 capsules by mouth once daily     Historical Provider, MD  Cyanocobalamin 500 MCG/0.1ML SOLN Place 1 mL under the tongue daily.     Historical Provider, MD  diazepam (VALIUM) 2 MG tablet Take 1 tablet (2 mg total) by mouth at bedtime. 09/30/15   Unk Pinto, MD  doxycycline (VIBRAMYCIN) 100 MG capsule Take 1 capsule (100 mg total) by mouth 2 (two) times daily. 03/27/16   Nehemiah Settle, NP  estradiol (ESTRACE) 1 MG tablet  Take 1 tablet (1 mg total) by mouth daily. 05/12/15   Courtney Forcucci, PA-C  folic acid (FOLVITE) Q000111Q MCG tablet Take 800 mcg by mouth daily.     Historical Provider, MD  furosemide (LASIX) 40 MG tablet Take 1 tablet (40 mg total) by mouth daily. 02/15/16   Unk Pinto, MD  hydroxychloroquine (PLAQUENIL) 200 MG tablet Take 2 tablets by mouth once daily     Historical Provider, MD  levothyroxine (SYNTHROID, LEVOTHROID) 100 MCG tablet TAKE ONE TABLET BY MOUTH ONCE DAILY 02/10/16   Unk Pinto, MD  methotrexate (RHEUMATREX) 2.5 MG tablet TAKE 10 TABLETS BY MOUTH ONCE A WEEK 05/29/15   Historical Provider, MD  montelukast (SINGULAIR) 10 MG tablet Take 1 tablet (10 mg total) by mouth daily. 02/15/16   Unk Pinto, MD  pravastatin (PRAVACHOL) 40 MG tablet Take 1  tablet (40 mg total) by mouth daily. 02/15/16   Unk Pinto, MD  propranolol (INDERAL) 80 MG tablet TAKE ONE TABLET BY MOUTH THREE TIMES DAILY 03/14/16   Unk Pinto, MD  ranitidine (ZANTAC) 300 MG tablet Take 1 tablet (300 mg total) by mouth at bedtime. 02/15/16   Unk Pinto, MD  traMADol (ULTRAM) 50 MG tablet Take 1 tablet (50 mg total) by mouth 4 (four) times daily. 08/15/14   Unk Pinto, MD  verapamil (CALAN-SR) 180 MG CR tablet Take 180 mg by mouth.    Historical Provider, MD   Meds Ordered and Administered this Visit  Medications - No data to display  BP 133/50 mmHg  Pulse 51  Temp(Src) 98 F (36.7 C) (Oral)  Resp 16  SpO2 100% No data found.   Physical Exam  Constitutional: She is oriented to person, place, and time. She appears well-developed and well-nourished. No distress.  Cardiovascular: Normal rate, regular rhythm, normal heart sounds and intact distal pulses.  Exam reveals no gallop and no friction rub.   No murmur heard. Pulmonary/Chest: Effort normal and breath sounds normal. No respiratory distress. She has no wheezes. She has no rales. She exhibits no tenderness.  Neurological: She is alert and oriented to person, place, and time.  Skin: Skin is warm and dry. She is not diaphoretic.  See attached images.   Nursing note and vitals reviewed.      Left arm.  Superior to [facility] area.  Area is warm and erythematic.  No pustular drainage noted.  Early stages of granulation at edges.      Right forearm.  Area has some clear drainage present.  No erythema or warmth.    ED Course  Procedures (including critical care time)  Labs Review Labs Reviewed - No data to display  Imaging Review No results found.   MDM   1. Local skin infection    Meds ordered this encounter  Medications  . omeprazole (PRILOSEC) 20 MG capsule    Sig: Take 20 mg by mouth 2 (two) times daily before a meal.  . DISCONTD: doxycycline (DORYX) 100 MG EC tablet    Sig: Take 100  mg by mouth 2 (two) times daily.  Marland Kitchen doxycycline (VIBRAMYCIN) 100 MG capsule    Sig: Take 1 capsule (100 mg total) by mouth 2 (two) times daily.    Dispense:  20 capsule    Refill:  0    Order Specific Question:  Supervising Provider    Answer:  Billy Fischer [phone]     Plan of care was discussed with Dr. Juventino Slovak.  Area of induration on Left arm was marked  with skin marker and the s/s of increasing infection discussed.  The patient verbalizes understanding and agrees to plan of care.       Nehemiah Settle, NP 03/27/16 1439

## 2016-04-07 ENCOUNTER — Encounter: Payer: Self-pay | Admitting: Internal Medicine

## 2016-04-19 ENCOUNTER — Ambulatory Visit (HOSPITAL_COMMUNITY)
Admission: RE | Admit: 2016-04-19 | Discharge: 2016-04-19 | Disposition: A | Discharge status: Home / Self Care | Payer: Commercial Managed Care - HMO | Source: Ambulatory Visit | Attending: Internal Medicine | Admitting: Internal Medicine

## 2016-04-19 ENCOUNTER — Encounter: Payer: Self-pay | Admitting: Internal Medicine

## 2016-04-19 ENCOUNTER — Ambulatory Visit (INDEPENDENT_AMBULATORY_CARE_PROVIDER_SITE_OTHER): Payer: Commercial Managed Care - HMO | Admitting: Internal Medicine

## 2016-04-19 VITALS — BP 142/70 | HR 56 | Temp 98.0°F | Resp 16 | Ht 63.0 in | Wt 169.0 lb

## 2016-04-19 DIAGNOSIS — S59902A Unspecified injury of left elbow, initial encounter: Secondary | ICD-10-CM | POA: Diagnosis not present

## 2016-04-19 DIAGNOSIS — M25512 Pain in left shoulder: Secondary | ICD-10-CM | POA: Diagnosis not present

## 2016-04-19 DIAGNOSIS — S5002XA Contusion of left elbow, initial encounter: Secondary | ICD-10-CM | POA: Insufficient documentation

## 2016-04-19 DIAGNOSIS — M25412 Effusion, left shoulder: Secondary | ICD-10-CM | POA: Insufficient documentation

## 2016-04-19 DIAGNOSIS — M069 Rheumatoid arthritis, unspecified: Secondary | ICD-10-CM | POA: Diagnosis not present

## 2016-04-19 DIAGNOSIS — M25522 Pain in left elbow: Secondary | ICD-10-CM | POA: Diagnosis not present

## 2016-04-19 DIAGNOSIS — W19XXXA Unspecified fall, initial encounter: Secondary | ICD-10-CM | POA: Diagnosis not present

## 2016-04-19 DIAGNOSIS — M7989 Other specified soft tissue disorders: Secondary | ICD-10-CM | POA: Diagnosis not present

## 2016-04-19 MED ORDER — HYDROCODONE-ACETAMINOPHEN 5-325 MG PO TABS: 1.0000 | ORAL_TABLET | Freq: Four times a day (QID) | ORAL | Status: DC | PRN

## 2016-04-19 MED ORDER — ONDANSETRON 8 MG PO TBDP: ORAL_TABLET | ORAL | Status: DC

## 2016-04-19 NOTE — Progress Notes (Signed)
Subjective:    Patient ID: Dana [name], female    DOB: [birth year], 75 y.o.   MRN: [id number]  HPI  Patient presents to the office for evaluation of left shoulder pain after a fall on 04/16/16.  She reports that that she fell and landed on the left side of her shoulder and arm.  She has limited range of motion since falling and some tingling.  She notes that she is right hand dominant.  She has some tingling in her fingers, but can still feel light touch.  She has noticed some bruising and also some swelling.  She has been using some ice on it.    Review of Systems  Constitutional: Negative for fever, chills and fatigue.  Gastrointestinal: Negative for nausea and vomiting.  Musculoskeletal: Positive for joint swelling and arthralgias.  Skin: Positive for color change.       Objective:   Physical Exam  Constitutional: She is oriented to person, place, and time. She appears well-developed and well-nourished. No distress.  HENT:  Head: Normocephalic.  Mouth/Throat: Oropharynx is clear and moist. No oropharyngeal exudate.  Eyes: Conjunctivae are normal. No scleral icterus.  Neck: Normal range of motion. Neck supple. No JVD present. No thyromegaly present.  Cardiovascular: Normal rate, regular rhythm, normal heart sounds and intact distal pulses.  Exam reveals no gallop and no friction rub.   No murmur heard. Pulmonary/Chest: Effort normal and breath sounds normal. No respiratory distress. She has no wheezes. She has no rales. She exhibits no tenderness.  Abdominal: Soft. Bowel sounds are normal. She exhibits no distension and no mass. There is no tenderness. There is no rebound and no guarding.  Musculoskeletal:       Left shoulder: She exhibits decreased range of motion, tenderness, bony tenderness, swelling, pain, spasm and decreased strength. She exhibits no effusion, no crepitus, no deformity, no laceration and normal pulse.       Left elbow: She exhibits normal range of motion, no  swelling, no effusion, no deformity and no laceration. No tenderness found.       Arms:      Left hand: Decreased sensation noted. Decreased sensation is present in the ulnar distribution and is present in the medial distribution. Decreased sensation is not present in the radial distribution. She exhibits no finger abduction, no thumb/finger opposition and no wrist extension trouble.  Lymphadenopathy:    She has no cervical adenopathy.  Neurological: She is alert and oriented to person, place, and time.  Skin: Skin is warm and dry. She is not diaphoretic.  Psychiatric: She has a normal mood and affect. Her behavior is normal. Judgment and thought content normal.  Nursing note and vitals reviewed.   Filed Vitals:   04/19/16 1547  BP: 142/70  Pulse: 56  Temp: 98 F (36.7 C)  Resp: 16          Assessment & Plan:    1. Left shoulder pain Humerus fracture vs. Rotator cuff rupture.  Sling placed here in the office.  Xrays did come back negative.  Highly suspect rotator cuff tear given age and lack of motion.  Will send to ortho for urgent appointment.   - DG Shoulder Left; Future - DG Elbow Complete Left; Future - ondansetron (ZOFRAN ODT) 8 MG disintegrating tablet; 8mg  ODT q4 hours prn nausea  Dispense: 30 tablet; Refill: 3 - HYDROcodone-acetaminophen (NORCO) 5-325 MG tablet; Take 1-2 tablets by mouth every 6 (six) hours as needed for moderate pain or severe pain.  Dispense: 60 tablet; Refill: 0 - Ambulatory referral to Orthopedics

## 2016-04-20 DIAGNOSIS — M25512 Pain in left shoulder: Secondary | ICD-10-CM | POA: Diagnosis not present

## 2016-04-26 DIAGNOSIS — Z08 Encounter for follow-up examination after completed treatment for malignant neoplasm: Secondary | ICD-10-CM | POA: Diagnosis not present

## 2016-04-26 DIAGNOSIS — L820 Inflamed seborrheic keratosis: Secondary | ICD-10-CM | POA: Diagnosis not present

## 2016-04-26 DIAGNOSIS — Z85828 Personal history of other malignant neoplasm of skin: Secondary | ICD-10-CM | POA: Diagnosis not present

## 2016-04-26 DIAGNOSIS — B009 Herpesviral infection, unspecified: Secondary | ICD-10-CM | POA: Diagnosis not present

## 2016-04-29 DIAGNOSIS — M25512 Pain in left shoulder: Secondary | ICD-10-CM | POA: Diagnosis not present

## 2016-04-29 DIAGNOSIS — M25612 Stiffness of left shoulder, not elsewhere classified: Secondary | ICD-10-CM | POA: Diagnosis not present

## 2016-05-06 ENCOUNTER — Other Ambulatory Visit: Payer: Self-pay | Admitting: Internal Medicine

## 2016-05-06 NOTE — Telephone Encounter (Signed)
Rx called into [facility].

## 2016-05-09 DIAGNOSIS — M25512 Pain in left shoulder: Secondary | ICD-10-CM | POA: Diagnosis not present

## 2016-05-09 DIAGNOSIS — M25612 Stiffness of left shoulder, not elsewhere classified: Secondary | ICD-10-CM | POA: Diagnosis not present

## 2016-05-12 DIAGNOSIS — M25512 Pain in left shoulder: Secondary | ICD-10-CM | POA: Diagnosis not present

## 2016-05-12 DIAGNOSIS — M25612 Stiffness of left shoulder, not elsewhere classified: Secondary | ICD-10-CM | POA: Diagnosis not present

## 2016-05-23 DIAGNOSIS — M25612 Stiffness of left shoulder, not elsewhere classified: Secondary | ICD-10-CM | POA: Diagnosis not present

## 2016-05-23 DIAGNOSIS — M25512 Pain in left shoulder: Secondary | ICD-10-CM | POA: Diagnosis not present

## 2016-05-26 DIAGNOSIS — M25512 Pain in left shoulder: Secondary | ICD-10-CM | POA: Diagnosis not present

## 2016-05-26 DIAGNOSIS — M25612 Stiffness of left shoulder, not elsewhere classified: Secondary | ICD-10-CM | POA: Diagnosis not present

## 2016-05-30 ENCOUNTER — Other Ambulatory Visit: Payer: Self-pay

## 2016-05-30 MED ORDER — LEVOTHYROXINE SODIUM 100 MCG PO TABS
100.0000 ug | ORAL_TABLET | Freq: Every day | ORAL | Status: DC
Start: 2016-05-30 — End: 2016-11-05

## 2016-05-30 MED ORDER — ESTRADIOL 1 MG PO TABS: 1.0000 mg | ORAL_TABLET | Freq: Every day | ORAL | Status: DC

## 2016-05-30 MED ORDER — TRAMADOL HCL 50 MG PO TABS: 50.0000 mg | ORAL_TABLET | Freq: Four times a day (QID) | ORAL | Status: DC

## 2016-06-02 DIAGNOSIS — M25512 Pain in left shoulder: Secondary | ICD-10-CM | POA: Diagnosis not present

## 2016-06-02 DIAGNOSIS — M25612 Stiffness of left shoulder, not elsewhere classified: Secondary | ICD-10-CM | POA: Diagnosis not present

## 2016-06-06 DIAGNOSIS — M25512 Pain in left shoulder: Secondary | ICD-10-CM | POA: Diagnosis not present

## 2016-06-06 DIAGNOSIS — M25612 Stiffness of left shoulder, not elsewhere classified: Secondary | ICD-10-CM | POA: Diagnosis not present

## 2016-06-08 DIAGNOSIS — M25612 Stiffness of left shoulder, not elsewhere classified: Secondary | ICD-10-CM | POA: Diagnosis not present

## 2016-06-08 DIAGNOSIS — M25512 Pain in left shoulder: Secondary | ICD-10-CM | POA: Diagnosis not present

## 2016-06-13 DIAGNOSIS — M25512 Pain in left shoulder: Secondary | ICD-10-CM | POA: Diagnosis not present

## 2016-06-13 DIAGNOSIS — M25612 Stiffness of left shoulder, not elsewhere classified: Secondary | ICD-10-CM | POA: Diagnosis not present

## 2016-06-15 ENCOUNTER — Encounter: Payer: Self-pay | Admitting: Internal Medicine

## 2016-06-15 ENCOUNTER — Ambulatory Visit (INDEPENDENT_AMBULATORY_CARE_PROVIDER_SITE_OTHER): Payer: Commercial Managed Care - HMO | Admitting: Internal Medicine

## 2016-06-15 VITALS — BP 110/62 | HR 80 | Temp 98.2°F | Resp 16 | Ht 63.0 in | Wt 167.0 lb

## 2016-06-15 DIAGNOSIS — Z79899 Other long term (current) drug therapy: Secondary | ICD-10-CM | POA: Diagnosis not present

## 2016-06-15 DIAGNOSIS — M069 Rheumatoid arthritis, unspecified: Secondary | ICD-10-CM

## 2016-06-15 DIAGNOSIS — F4321 Adjustment disorder with depressed mood: Secondary | ICD-10-CM | POA: Diagnosis not present

## 2016-06-15 DIAGNOSIS — E559 Vitamin D deficiency, unspecified: Secondary | ICD-10-CM

## 2016-06-15 DIAGNOSIS — R7309 Other abnormal glucose: Secondary | ICD-10-CM | POA: Diagnosis not present

## 2016-06-15 DIAGNOSIS — I10 Essential (primary) hypertension: Secondary | ICD-10-CM | POA: Diagnosis not present

## 2016-06-15 DIAGNOSIS — E039 Hypothyroidism, unspecified: Secondary | ICD-10-CM | POA: Diagnosis not present

## 2016-06-15 DIAGNOSIS — E785 Hyperlipidemia, unspecified: Secondary | ICD-10-CM | POA: Diagnosis not present

## 2016-06-15 DIAGNOSIS — M75102 Unspecified rotator cuff tear or rupture of left shoulder, not specified as traumatic: Secondary | ICD-10-CM | POA: Diagnosis not present

## 2016-06-15 LAB — BASIC METABOLIC PANEL WITH GFR
BUN: 16 mg/dL (ref 7–25)
CHLORIDE: 102 mmol/L (ref 98–110)
CO2: 31 mmol/L (ref 20–31)
Calcium: 9.2 mg/dL (ref 8.6–10.4)
Creat: 0.92 mg/dL (ref 0.60–0.93)
GFR, Est African American: 69 mL/min (ref >=60)
GFR, Est Non African American: 60 mL/min (ref >=60)
Glucose, Bld: 78 mg/dL (ref 65–99)
POTASSIUM: 4.4 mmol/L (ref 3.5–5.3)
SODIUM: 140 mmol/L (ref 135–146)

## 2016-06-15 LAB — HEPATIC FUNCTION PANEL
ALK PHOS: 72 U/L (ref 33–130)
ALT: 12 U/L (ref 6–29)
AST: 19 U/L (ref 10–35)
Albumin: 4 g/dL (ref 3.6–5.1)
BILIRUBIN DIRECT: 0.1 mg/dL (ref <=0.2)
BILIRUBIN INDIRECT: 0.6 mg/dL (ref 0.2–1.2)
BILIRUBIN TOTAL: 0.7 mg/dL (ref 0.2–1.2)
Total Protein: 6.1 g/dL (ref 6.1–8.1)

## 2016-06-15 LAB — CBC WITH DIFFERENTIAL/PLATELET
Basophils Absolute: 0 cells/uL (ref 0–200)
Basophils Relative: 0 %
EOS PCT: 2 %
Eosinophils Absolute: 100 cells/uL (ref 15–500)
HCT: 38.4 % (ref 35.0–45.0)
HEMOGLOBIN: 12.7 g/dL (ref 11.7–15.5)
LYMPHS ABS: 1750 {cells}/uL (ref 850–3900)
Lymphocytes Relative: 35 %
MCH: 31.9 pg (ref 27.0–33.0)
MCHC: 33.1 g/dL (ref 32.0–36.0)
MCV: 96.5 fL (ref 80.0–100.0)
MPV: 9.9 fL (ref 7.5–12.5)
Monocytes Absolute: 850 cells/uL (ref 200–950)
Monocytes Relative: 17 %
NEUTROS ABS: 2300 {cells}/uL (ref 1500–7800)
Neutrophils Relative %: 46 %
PLATELETS: 253 10*3/uL (ref 140–400)
RBC: 3.98 MIL/uL (ref 3.80–5.10)
RDW: 15.7 % — ABNORMAL HIGH (ref 11.0–15.0)
WBC: 5 10*3/uL (ref 3.8–10.8)

## 2016-06-15 LAB — TSH: TSH: 2.31 m[IU]/L

## 2016-06-15 LAB — LIPID PANEL
CHOL/HDL RATIO: 1.9 ratio (ref <=5.0)
Cholesterol: 161 mg/dL (ref 125–200)
HDL: 84 mg/dL (ref >=46)
LDL CALC: 61 mg/dL (ref <130)
Triglycerides: 81 mg/dL (ref <150)
VLDL: 16 mg/dL (ref <30)

## 2016-06-15 LAB — HEMOGLOBIN A1C
HEMOGLOBIN A1C: 5.1 % (ref <5.7)
Mean Plasma Glucose: 100 mg/dL

## 2016-06-15 MED ORDER — DIAZEPAM 2 MG PO TABS: 2.0000 mg | ORAL_TABLET | Freq: Three times a day (TID) | ORAL | 0 refills | Status: DC | PRN

## 2016-06-15 NOTE — Progress Notes (Signed)
Assessment and Plan:  Hypertension:  -Continue medication,  -monitor blood pressure at home.  -Continue DASH diet.   -Reminder to go to the ER if any CP, SOB, nausea, dizziness, severe HA, changes vision/speech, left arm numbness and tingling, and jaw pain.  Cholesterol: -Continue diet and exercise.  -Check cholesterol.   Pre-diabetes: -Continue diet and exercise.  -Check A1C  Vitamin D Def: -continue medications.   Rheumatoid arthritis -cont methotrexate -cont plaquenil -cont follow-up with rheumatology -will send LFTs to Dr. Kristopher Glee -uncomplicated at this time over the death of her sister and nephew  Left rotator cuff tear -undergoing PT -cont exercises -followed by Dr. Rhona Raider -trying to avoid surgery  Hypothyroidism -TSH -Levothyroxine  Continue diet and meds as discussed. Further disposition pending results of labs.  HPI 75 y.o. female  presents for 3 month follow up with hypertension, hyperlipidemia, prediabetes and vitamin D.   Her blood pressure has been controlled at home, today their BP is BP: 110/62.   She does not workout. She denies chest pain, shortness of breath, dizziness.  She is severely limited secondary to bad essential tremor.     She is not on cholesterol medication and denies myalgias. Her cholesterol is at goal. The cholesterol last visit was:   Lab Results  Component Value Date   CHOL 132 03/14/2016   HDL 56 03/14/2016   LDLCALC 51 03/14/2016   TRIG 127 03/14/2016   CHOLHDL 2.4 03/14/2016     She has been working on diet and exercise for prediabetes, and denies foot ulcerations, hyperglycemia, hypoglycemia , increased appetite, nausea, paresthesia of the feet, polydipsia, polyuria, visual disturbances, vomiting and weight loss. Last A1C in the office was:  Lab Results  Component Value Date   HGBA1C 5.5 03/14/2016    Patient is on Vitamin D supplement.  Lab Results  Component Value Date   VD25OH 41 03/14/2016     She is  on thyroid medication daily.  She reports   She reports that she has been having a lot going on.  She has recently lost a sister, a nephew, and her great friend passing away.    She reports that her previous fall resulted in her tearing her left rotator cuff.  She is doing physical therapy.  She reports that she has 3 more sessions.  She reports that she is doing very well with it.  She reports that she will keep doing her exercises at home.    She would like to increase her valium slightly so that she can do a little bit better with her tremors.  She doesn't want to do it 4 times per day.    She is following with her rheumatologist.  She is due to see them again in September.  She reports that she is doing well with the methotrexate and the plaquenil.    Current Medications:  Current Outpatient Prescriptions on File Prior to Visit  Medication Sig Dispense Refill  . Cholecalciferol (VITAMIN D) 2000 UNITS CAPS Take by mouth. Take 4 capsules by mouth once daily     . Cyanocobalamin 500 MCG/0.1ML SOLN Place 1 mL under the tongue daily.     . diazepam (VALIUM) 2 MG tablet TAKE ONE TABLET BY MOUTH AT BEDTIME 90 tablet 0  . estradiol (ESTRACE) 1 MG tablet Take 1 tablet (1 mg total) by mouth daily. 90 tablet 3  . folic acid (FOLVITE) Q000111Q MCG tablet Take 800 mcg by mouth daily.     Marland Kitchen  furosemide (LASIX) 40 MG tablet Take 1 tablet (40 mg total) by mouth daily. 90 tablet 3  . HYDROcodone-acetaminophen (NORCO) 5-325 MG tablet Take 1-2 tablets by mouth every 6 (six) hours as needed for moderate pain or severe pain. 60 tablet 0  . hydroxychloroquine (PLAQUENIL) 200 MG tablet Take 2 tablets by mouth once daily     . levothyroxine (SYNTHROID, LEVOTHROID) 100 MCG tablet Take 1 tablet (100 mcg total) by mouth daily. 90 tablet 1  . methotrexate (RHEUMATREX) 2.5 MG tablet TAKE 10 TABLETS BY MOUTH ONCE A WEEK    . montelukast (SINGULAIR) 10 MG tablet Take 1 tablet (10 mg total) by mouth daily. 90 tablet 3  .  omeprazole (PRILOSEC) 20 MG capsule Take 20 mg by mouth 2 (two) times daily before a meal.    . ondansetron (ZOFRAN ODT) 8 MG disintegrating tablet 8mg  ODT q4 hours prn nausea 30 tablet 3  . pravastatin (PRAVACHOL) 40 MG tablet Take 1 tablet (40 mg total) by mouth daily. 90 tablet 3  . propranolol (INDERAL) 80 MG tablet TAKE ONE TABLET BY MOUTH THREE TIMES DAILY 270 tablet 1  . ranitidine (ZANTAC) 300 MG tablet Take 1 tablet (300 mg total) by mouth at bedtime. 90 tablet 3  . traMADol (ULTRAM) 50 MG tablet Take 1 tablet (50 mg total) by mouth 4 (four) times daily. 360 tablet 1  . verapamil (CALAN-SR) 180 MG CR tablet Take 180 mg by mouth.     No current facility-administered medications on file prior to visit.     Medical History:  Past Medical History:  Diagnosis Date  . Anemia   . Cataract   . Environmental allergies   . Gastric ulcer   . Heartburn   . Hiatal hernia   . Hyperlipidemia   . Hypertension   . Hypothyroidism   . Migraines   . Prediabetes   . Rash october 2012   all over abdomen and breasts  . Rheumatoid arthritis(714.0)   . Tremor, essential    right hand    Allergies:  Allergies  Allergen Reactions  . Codeine Nausea Only  . Mysoline [Primidone] Nausea Only and Other (See Comments)    Over-sedated     Review of Systems:  ROS  Family history- Review and unchanged  Social history- Review and unchanged  Physical Exam: BP 110/62   Pulse 80   Temp 98.2 F (36.8 C) (Temporal)   Resp 16   Ht 5\' 3"  (1.6 m)   Wt 167 lb (75.8 kg)   BMI 29.58 kg/m  Wt Readings from Last 3 Encounters:  06/15/16 167 lb (75.8 kg)  04/19/16 169 lb (76.7 kg)  03/14/16 170 lb 6.4 oz (77.3 kg)    General Appearance: Well nourished well developed, in no apparent distress. Eyes: PERRLA, EOMs, conjunctiva no swelling or erythema ENT/Mouth: Ear canals normal without obstruction, swelling, erythma, discharge.  TMs normal bilaterally.  Oropharynx moist, clear, without exudate, or  postoropharyngeal swelling. Neck: Supple, thyroid normal,no cervical adenopathy  Respiratory: Respiratory effort normal, Breath sounds clear A&P without rhonchi, wheeze, or rale.  No retractions, no accessory usage. Cardio: RRR with no MRGs. Brisk peripheral pulses without edema.  Abdomen: Soft, + BS,  Non tender, no guarding, rebound, hernias, masses. Musculoskeletal: Full ROM, 5/5 strength, Normal gait Skin: Warm, dry without rashes, lesions, ecchymosis.  Neuro: Awake and oriented X 3, Cranial nerves intact. Normal muscle tone, no cerebellar symptoms. Psych: Normal affect, Insight and Judgment appropriate.    Starlyn Skeans, PA-C  2:40 PM [facility]

## 2016-06-16 DIAGNOSIS — M25512 Pain in left shoulder: Secondary | ICD-10-CM | POA: Diagnosis not present

## 2016-06-16 DIAGNOSIS — M25612 Stiffness of left shoulder, not elsewhere classified: Secondary | ICD-10-CM | POA: Diagnosis not present

## 2016-06-20 DIAGNOSIS — M25612 Stiffness of left shoulder, not elsewhere classified: Secondary | ICD-10-CM | POA: Diagnosis not present

## 2016-06-20 DIAGNOSIS — M25512 Pain in left shoulder: Secondary | ICD-10-CM | POA: Diagnosis not present

## 2016-06-24 DIAGNOSIS — M25512 Pain in left shoulder: Secondary | ICD-10-CM | POA: Diagnosis not present

## 2016-07-15 DIAGNOSIS — Z961 Presence of intraocular lens: Secondary | ICD-10-CM | POA: Diagnosis not present

## 2016-07-15 DIAGNOSIS — Z0100 Encounter for examination of eyes and vision without abnormal findings: Secondary | ICD-10-CM | POA: Diagnosis not present

## 2016-07-15 DIAGNOSIS — Z79899 Other long term (current) drug therapy: Secondary | ICD-10-CM | POA: Diagnosis not present

## 2016-07-20 ENCOUNTER — Other Ambulatory Visit: Payer: Self-pay | Admitting: *Deleted

## 2016-07-20 DIAGNOSIS — M069 Rheumatoid arthritis, unspecified: Secondary | ICD-10-CM | POA: Diagnosis not present

## 2016-07-20 DIAGNOSIS — Z79899 Other long term (current) drug therapy: Secondary | ICD-10-CM | POA: Diagnosis not present

## 2016-07-20 MED ORDER — PROPRANOLOL HCL 80 MG PO TABS: 80.0000 mg | ORAL_TABLET | Freq: Three times a day (TID) | ORAL | 1 refills | Status: DC

## 2016-07-20 MED ORDER — DIAZEPAM 2 MG PO TABS: 2.0000 mg | ORAL_TABLET | Freq: Three times a day (TID) | ORAL | 1 refills | Status: AC | PRN

## 2016-07-22 ENCOUNTER — Other Ambulatory Visit: Payer: Self-pay | Admitting: *Deleted

## 2016-07-22 MED ORDER — PROPRANOLOL HCL 80 MG PO TABS: 80.0000 mg | ORAL_TABLET | Freq: Three times a day (TID) | ORAL | 1 refills | Status: DC

## 2016-08-18 ENCOUNTER — Encounter: Payer: Self-pay | Admitting: Internal Medicine

## 2016-08-18 ENCOUNTER — Ambulatory Visit (INDEPENDENT_AMBULATORY_CARE_PROVIDER_SITE_OTHER): Payer: Commercial Managed Care - HMO | Admitting: Internal Medicine

## 2016-08-18 VITALS — BP 140/74 | HR 60 | Temp 97.4°F | Resp 16 | Ht 64.0 in | Wt 169.8 lb

## 2016-08-18 DIAGNOSIS — R609 Edema, unspecified: Secondary | ICD-10-CM | POA: Diagnosis not present

## 2016-08-18 DIAGNOSIS — N390 Urinary tract infection, site not specified: Secondary | ICD-10-CM

## 2016-08-18 DIAGNOSIS — Z23 Encounter for immunization: Secondary | ICD-10-CM

## 2016-08-18 DIAGNOSIS — N898 Other specified noninflammatory disorders of vagina: Secondary | ICD-10-CM

## 2016-08-18 DIAGNOSIS — I10 Essential (primary) hypertension: Secondary | ICD-10-CM | POA: Diagnosis not present

## 2016-08-18 MED ORDER — FUROSEMIDE 40 MG PO TABS: ORAL_TABLET | ORAL | 1 refills | Status: DC

## 2016-08-18 MED ORDER — FLUCONAZOLE 150 MG PO TABS: ORAL_TABLET | ORAL | 0 refills | Status: AC

## 2016-08-18 NOTE — Progress Notes (Signed)
Subjective:    Patient ID: Dana [name], female    DOB: [birth year], 75 y.o.   MRN: [id number]  HPI  This very nice 75 yo WWF with HTN, HLD, Hypothyroidism, Venous Insufficiency, Rheumatoid Arthritis & PreDiabetes presents with c/o dysuria, urinary urgency. Also c/o dependent edema w/o c/o dyspnea. Further c/o vaginal d/c after recent treatment with an antibiotic.   Medication Sig  . Cholecalciferol (VITAMIN D) 2000 UNITS CAPS Take by mouth. Take 4 capsules by mouth once daily   . Cyanocobalamin 500 MCG/0.1ML SOLN Place 1 mL under the tongue daily.   . diazepam (VALIUM) 2 MG tablet Take 1 tablet (2 mg total) by mouth every 8 (eight) hours as needed for anxiety.  Marland Kitchen estradiol (ESTRACE) 1 MG tablet Take 1 tablet (1 mg total) by mouth daily.  . folic acid (FOLVITE) Q000111Q MCG tablet Take 800 mcg by mouth daily.   . hydroxychloroquine (PLAQUENIL) 200 MG tablet Take 2 tablets by mouth once daily   . levothyroxine (SYNTHROID, LEVOTHROID) 100 MCG tablet Take 1 tablet (100 mcg total) by mouth daily.  . methotrexate (RHEUMATREX) 2.5 MG tablet TAKE 10 TABLETS BY MOUTH ONCE A WEEK  . montelukast (SINGULAIR) 10 MG tablet Take 1 tablet (10 mg total) by mouth daily.  Marland Kitchen omeprazole (PRILOSEC) 20 MG capsule Take 20 mg by mouth 2 (two) times daily before a meal.  . ondansetron (ZOFRAN ODT) 8 MG disintegrating tablet 8mg  ODT q4 hours prn nausea  . pravastatin (PRAVACHOL) 40 MG tablet Take 1 tablet (40 mg total) by mouth daily.  . propranolol (INDERAL) 80 MG tablet Take 1 tablet (80 mg total) by mouth 3 (three) times daily.  . ranitidine (ZANTAC) 300 MG tablet Take 1 tablet (300 mg total) by mouth at bedtime.  . traMADol (ULTRAM) 50 MG tablet Take 1 tablet (50 mg total) by mouth 4 (four) times daily.  . furosemide (LASIX) 40 MG tablet Take 1 tablet (40 mg total) by mouth daily.  . verapamil (CALAN-SR) 180 MG CR tablet Take 180 mg by mouth.  Marland Kitchen HYDROcodone-acetaminophen (NORCO) 5-325 MG tablet Take 1-2 tablets by  mouth every 6 (six) hours as needed for moderate pain or severe pain.   Past Medical History:  Diagnosis Date  . Anemia   . Cataract   . Environmental allergies   . Gastric ulcer   . Heartburn   . Hiatal hernia   . Hyperlipidemia   . Hypertension   . Hypothyroidism   . Migraines   . Prediabetes   . Rash october 2012   all over abdomen and breasts  . Rheumatoid arthritis(714.0)   . Tremor, essential    right hand   Past Surgical History:  Procedure Laterality Date  . ABDOMINAL HYSTERECTOMY    . CATARACT EXTRACTION, BILATERAL    . CHOLECYSTECTOMY  08/31/11   lap chole   . COLONOSCOPY    . TUBAL LIGATION     Review of Systems  10 point systems review negative except as above.    Objective:   Physical Exam  BP 140/74   Pulse 60   Temp 97.4 F (36.3 C)   Resp 16   Ht 5\' 4"  (1.626 m)   Wt 169 lb 12.8 oz (77 kg)   BMI 29.15 kg/m   HEENT - Eac's patent. TM's Nl. EOM's full. PERRLA. NasoOroPharynx clear. Neck - supple. Nl Thyroid. Carotids 2+ & No bruits, nodes, JVD Chest - Clear. Cor - Nl HS. RRR w/o sig M. PP 1(+). 1-2+  pretibial edema. MS- FROM w/o deformities. Muscle power, tone and bulk Nl. Gait Nl. Neuro - No obvious Cr N abnormalities. Nl w/o focal abnormalities.    Assessment & Plan:   1. Urinary tract infection, site not specified  - Urinalysis, Routine w reflex microscopic - Urine culture  2. Vaginal discharge  - fluconazole (DIFLUCAN) 150 MG tablet; Take 1 tablet weekly  Dispense: 4 tablet; Refill: 0  3. Need for prophylactic vaccination and inoculation against influenza  - Flu vaccine HIGH DOSE PF (Fluzone High dose)  4. Edema, unspecified type  - furosemide (LASIX) 40 MG tablet; Take 1 tablet 2 x/day for fluid retention / ankle swelling  Dispense: 180 tablet; Refill: 1  5. Essential hypertension  - Discussed meds & SE's. Over 20 minutes of exam, counseling, chart review and critical decision making was performed

## 2016-08-19 LAB — URINALYSIS, ROUTINE W REFLEX MICROSCOPIC
BILIRUBIN URINE: NEGATIVE
GLUCOSE, UA: NEGATIVE
Hgb urine dipstick: NEGATIVE
KETONES UR: NEGATIVE
Leukocytes, UA: NEGATIVE
Nitrite: NEGATIVE
PH: 8.5 — AB (ref 5.0–8.0)
SPECIFIC GRAVITY, URINE: 1.023 (ref 1.001–1.035)

## 2016-08-19 LAB — URINALYSIS, MICROSCOPIC ONLY
BACTERIA UA: NONE SEEN [HPF]
CASTS: NONE SEEN [LPF]
CRYSTALS: NONE SEEN [HPF]
YEAST: NONE SEEN [HPF]

## 2016-08-20 LAB — URINE CULTURE

## 2016-09-15 ENCOUNTER — Ambulatory Visit (INDEPENDENT_AMBULATORY_CARE_PROVIDER_SITE_OTHER): Payer: Commercial Managed Care - HMO | Admitting: Internal Medicine

## 2016-09-15 ENCOUNTER — Ambulatory Visit: Payer: Self-pay | Admitting: Internal Medicine

## 2016-09-15 ENCOUNTER — Encounter: Payer: Self-pay | Admitting: Internal Medicine

## 2016-09-15 VITALS — BP 116/62 | HR 58 | Temp 98.0°F | Resp 16 | Ht 64.0 in | Wt 170.0 lb

## 2016-09-15 DIAGNOSIS — E039 Hypothyroidism, unspecified: Secondary | ICD-10-CM

## 2016-09-15 DIAGNOSIS — I10 Essential (primary) hypertension: Secondary | ICD-10-CM

## 2016-09-15 DIAGNOSIS — G43809 Other migraine, not intractable, without status migrainosus: Secondary | ICD-10-CM

## 2016-09-15 DIAGNOSIS — R7309 Other abnormal glucose: Secondary | ICD-10-CM | POA: Diagnosis not present

## 2016-09-15 DIAGNOSIS — M159 Polyosteoarthritis, unspecified: Secondary | ICD-10-CM

## 2016-09-15 DIAGNOSIS — E559 Vitamin D deficiency, unspecified: Secondary | ICD-10-CM | POA: Diagnosis not present

## 2016-09-15 DIAGNOSIS — Z0001 Encounter for general adult medical examination with abnormal findings: Secondary | ICD-10-CM | POA: Diagnosis not present

## 2016-09-15 DIAGNOSIS — R6889 Other general symptoms and signs: Secondary | ICD-10-CM

## 2016-09-15 DIAGNOSIS — D649 Anemia, unspecified: Secondary | ICD-10-CM

## 2016-09-15 DIAGNOSIS — G250 Essential tremor: Secondary | ICD-10-CM

## 2016-09-15 DIAGNOSIS — Z79899 Other long term (current) drug therapy: Secondary | ICD-10-CM | POA: Diagnosis not present

## 2016-09-15 DIAGNOSIS — E782 Mixed hyperlipidemia: Secondary | ICD-10-CM | POA: Diagnosis not present

## 2016-09-15 DIAGNOSIS — Z0000 Encounter for general adult medical examination without abnormal findings: Secondary | ICD-10-CM

## 2016-09-15 DIAGNOSIS — M069 Rheumatoid arthritis, unspecified: Secondary | ICD-10-CM | POA: Diagnosis not present

## 2016-09-15 LAB — BASIC METABOLIC PANEL WITH GFR
BUN: 12 mg/dL (ref 7–25)
CHLORIDE: 106 mmol/L (ref 98–110)
CO2: 27 mmol/L (ref 20–31)
Calcium: 8.7 mg/dL (ref 8.6–10.4)
Creat: 0.84 mg/dL (ref 0.60–0.93)
GFR, EST AFRICAN AMERICAN: 78 mL/min (ref >=60)
GFR, EST NON AFRICAN AMERICAN: 67 mL/min (ref >=60)
Glucose, Bld: 81 mg/dL (ref 65–99)
POTASSIUM: 4.1 mmol/L (ref 3.5–5.3)
Sodium: 142 mmol/L (ref 135–146)

## 2016-09-15 LAB — HEPATIC FUNCTION PANEL
ALBUMIN: 3.6 g/dL (ref 3.6–5.1)
ALK PHOS: 61 U/L (ref 33–130)
ALT: 15 U/L (ref 6–29)
AST: 24 U/L (ref 10–35)
BILIRUBIN DIRECT: 0.1 mg/dL (ref <=0.2)
BILIRUBIN INDIRECT: 0.5 mg/dL (ref 0.2–1.2)
BILIRUBIN TOTAL: 0.6 mg/dL (ref 0.2–1.2)
Total Protein: 5.8 g/dL — ABNORMAL LOW (ref 6.1–8.1)

## 2016-09-15 LAB — CBC WITH DIFFERENTIAL/PLATELET
BASOS PCT: 1 %
Basophils Absolute: 38 cells/uL (ref 0–200)
EOS ABS: 76 {cells}/uL (ref 15–500)
Eosinophils Relative: 2 %
HCT: 37.8 % (ref 35.0–45.0)
Hemoglobin: 12.2 g/dL (ref 11.7–15.5)
Lymphocytes Relative: 38 %
Lymphs Abs: 1444 cells/uL (ref 850–3900)
MCH: 31.8 pg (ref 27.0–33.0)
MCHC: 32.3 g/dL (ref 32.0–36.0)
MCV: 98.4 fL (ref 80.0–100.0)
MONO ABS: 342 {cells}/uL (ref 200–950)
MONOS PCT: 9 %
MPV: 9.8 fL (ref 7.5–12.5)
NEUTROS ABS: 1900 {cells}/uL (ref 1500–7800)
Neutrophils Relative %: 50 %
PLATELETS: 238 10*3/uL (ref 140–400)
RBC: 3.84 MIL/uL (ref 3.80–5.10)
RDW: 15.5 % — AB (ref 11.0–15.0)
WBC: 3.8 10*3/uL (ref 3.8–10.8)

## 2016-09-15 LAB — LIPID PANEL
CHOL/HDL RATIO: 2.6 ratio (ref <=5.0)
Cholesterol: 153 mg/dL (ref 125–200)
HDL: 58 mg/dL (ref >=46)
LDL CALC: 69 mg/dL (ref <130)
Triglycerides: 132 mg/dL (ref <150)
VLDL: 26 mg/dL (ref <30)

## 2016-09-15 LAB — HEMOGLOBIN A1C
HEMOGLOBIN A1C: 5.2 % (ref <5.7)
MEAN PLASMA GLUCOSE: 103 mg/dL

## 2016-09-15 LAB — TSH: TSH: 1.89 mIU/L

## 2016-09-15 NOTE — Progress Notes (Signed)
MEDICARE ANNUAL WELLNESS VISIT AND FOLLOW UP  Assessment:    1. Essential hypertension -cont meds -dash diet -monitor at home -exercise as tolerated.   - TSH  2. Other migraine without status migrainosus, not intractable -cont meds -well controlled currently  3. Hypothyroidism, unspecified type -cont levothyroxine - TSH  4. Tremor, essential -cont benzodiazepines  5. Generalized OA -cont tramadol prn -cont methotrexate  6. Rheumatoid arthritis involving multiple sites, unspecified rheumatoid factor presence (HCC) -cont methoterxate  7. Anemia, unspecified type -cont slow release iron  8. Mixed hyperlipidemia -cont pravastatin - Lipid panel  9. Medication management  - CBC with Differential/Platelet - BASIC METABOLIC PANEL WITH GFR - Hepatic function panel  10. Other abnormal glucose -cont diet and exercise - Hemoglobin A1c  11. Other long term (current) drug therapy -cont lab management  12. Vitamin D deficiency -cont Vit D supplement -cont Vit D monitoring    Over 30 minutes of exam, counseling, chart review, and critical decision making was performed  Future Appointments Date Time Provider [place]  12/28/2016 2:00 PM Unk Pinto, MD [id number] None    Plan:   During the course of the visit the patient was educated and counseled about appropriate screening and preventive services including:    Pneumococcal vaccine   Influenza vaccine  Td vaccine  Prevnar 13  Screening electrocardiogram  Screening mammography  Bone densitometry screening  Colorectal cancer screening  Diabetes screening  Glaucoma screening  Nutrition counseling   Advanced directives: given info/requested copies   Subjective:   Dana [name] is a 75 y.o. female who presents for Medicare Annual Wellness Visit and 3 month follow up on hypertension, prediabetes, hyperlipidemia, vitamin D def.   Her blood pressure has been controlled at home, today  their BP is BP: 116/62 She does not workout. She denies chest pain, shortness of breath, dizziness.    She is on cholesterol medication and denies myalgias. Her cholesterol is at goal. The cholesterol last visit was:  Lab Results  Component Value Date   CHOL 161 06/15/2016   HDL 84 06/15/2016   LDLCALC 61 06/15/2016   TRIG 81 06/15/2016   CHOLHDL 1.9 06/15/2016   A1C level have been doing really well.  She has been controlling it with diet.    Lab Results  Component Value Date   HGBA1C 5.1 06/15/2016   Last GFR Lab Results  Component Value Date   GFRNONAA 60 06/15/2016   Lab Results  Component Value Date   GFRAA 69 06/15/2016   Patient is on Vitamin D supplement. Lab Results  Component Value Date   VD25OH 41 03/14/2016     Patient reports that she is following with rheumatology.  She reports taht she didn't get her medication for a week because a lab mix up.  She reports that she just started  Her medication yesterday to get back in her system.  She notes that her allergies are bothering her right now.  She reports that she is doing flonase, zyrtec, singulair.  She is not taking benadryl.   Medication Review Current Outpatient Prescriptions on File Prior to Visit  Medication Sig Dispense Refill  . Cholecalciferol (VITAMIN D) 2000 UNITS CAPS Take by mouth. Take 4 capsules by mouth once daily     . Cyanocobalamin 500 MCG/0.1ML SOLN Place 1 mL under the tongue daily.     . diazepam (VALIUM) 2 MG tablet Take 1 tablet (2 mg total) by mouth every 8 (eight) hours as needed for anxiety.  270 tablet 1  . estradiol (ESTRACE) 1 MG tablet Take 1 tablet (1 mg total) by mouth daily. 90 tablet 3  . fluconazole (DIFLUCAN) 150 MG tablet Take 1 tablet weekly 4 tablet 0  . folic acid (FOLVITE) Q000111Q MCG tablet Take 800 mcg by mouth daily.     . furosemide (LASIX) 40 MG tablet Take 1 tablet 2 x/day for fluid retention / ankle swelling 180 tablet 1  . hydroxychloroquine (PLAQUENIL) 200 MG  tablet Take 2 tablets by mouth once daily     . levothyroxine (SYNTHROID, LEVOTHROID) 100 MCG tablet Take 1 tablet (100 mcg total) by mouth daily. 90 tablet 1  . methotrexate (RHEUMATREX) 2.5 MG tablet TAKE 10 TABLETS BY MOUTH ONCE A WEEK    . montelukast (SINGULAIR) 10 MG tablet Take 1 tablet (10 mg total) by mouth daily. 90 tablet 3  . omeprazole (PRILOSEC) 20 MG capsule Take 20 mg by mouth 2 (two) times daily before a meal.    . ondansetron (ZOFRAN ODT) 8 MG disintegrating tablet 8mg  ODT q4 hours prn nausea 30 tablet 3  . pravastatin (PRAVACHOL) 40 MG tablet Take 1 tablet (40 mg total) by mouth daily. 90 tablet 3  . propranolol (INDERAL) 80 MG tablet Take 1 tablet (80 mg total) by mouth 3 (three) times daily. 270 tablet 1  . ranitidine (ZANTAC) 300 MG tablet Take 1 tablet (300 mg total) by mouth at bedtime. 90 tablet 3  . traMADol (ULTRAM) 50 MG tablet Take 1 tablet (50 mg total) by mouth 4 (four) times daily. 360 tablet 1  . verapamil (CALAN-SR) 180 MG CR tablet Take 180 mg by mouth.     No current facility-administered medications on file prior to visit.     Allergies: Allergies  Allergen Reactions  . Codeine Nausea Only  . Mysoline [Primidone] Nausea Only and Other (See Comments)    Over-sedated    Current Problems (verified) has Rheumatoid arthritis ([place]); Tremor, essential; Hypothyroidism; Migraines; Hyperlipidemia; Anemia; Hypertension; Other abnormal glucose; Vitamin D deficiency; Medication management; Generalized OA; and Other long term (current) drug therapy on her problem list.  Screening Tests Immunization History  Administered Date(s) Administered  . DT 02/05/2015  . Influenza Split 09/12/2013  . Influenza, High Dose Seasonal PF 10/28/2014, 08/18/2016  . Pneumococcal Conjugate-13 05/12/2015  . Pneumococcal-Unspecified 11/21/2006  . Td 11/22/2003  . Varicella 06/23/2011  . Zoster 06/23/2011    Preventative care: Last colonoscopy: 2013 Last mammogram: 2015 Last  pap smear/pelvic exam: Hysterectomy   DEXA:  2014  Prior vaccinations: TD or Tdap: 2016  Influenza: 2017  Pneumococcal: 2008 Prevnar13: 2016 Shingles/Zostavax: 2012  Names of Other Physician/Practitioners you currently use: 1. [facility]- here for primary care 2. Dr. Ellie Lunch, eye doctor, last visit  Aug 2017 3. Dr. Ninfa Linden, dentist, last visit 2017 Patient Care Team: Unk Pinto, MD as PCP - General (Internal Medicine) Roetta Sessions, MD as Referring Physician (Internal Medicine)  Surgical: She  has a past surgical history that includes Abdominal hysterectomy; Tubal ligation; Cataract extraction, bilateral; Cholecystectomy (08/31/11); and Colonoscopy. Family Her family history includes Diabetes in her mother and sister; Heart disease in her father, maternal aunt, maternal uncle, and mother. Social history  She reports that she quit smoking about 32 years ago. Her smoking use included Cigarettes. She has never used smokeless tobacco. She reports that she does not drink alcohol or use drugs.  MEDICARE WELLNESS OBJECTIVES: Physical activity:   Cardiac risk factors:   Depression/mood screen:  Depression screen [facility] 2/9 03/14/2016  Decreased Interest 0  Down, Depressed, Hopeless 0  PHQ - 2 Score 0    ADLs:  In your present state of health, do you have any difficulty performing the following activities: 03/14/2016  Hearing? N  Vision? N  Difficulty concentrating or making decisions? N  Walking or climbing stairs? N  Dressing or bathing? N  Doing errands, shopping? N  Some recent data might be hidden     Cognitive Testing  Alert? Yes  Normal Appearance?Yes  Oriented to person? Yes  Place? Yes   Time? Yes  Recall of three objects?  Yes  Can perform simple calculations? Yes  Displays appropriate judgment?Yes  Can read the correct time from a watch face?Yes  EOL planning:     Objective:   Today's Vitals   09/15/16 1132  BP:  116/62  Pulse: (!) 58  Resp: 16  Temp: 98 F (36.7 C)  TempSrc: Temporal  Weight: 170 lb (77.1 kg)  Height: 5\' 4"  (1.626 m)   Body mass index is 29.18 kg/m.  General appearance: alert, no distress, WD/WN,  female HEENT: normocephalic, sclerae anicteric, TMs pearly, nares patent, no discharge or erythema, pharynx normal Oral cavity: MMM, no lesions Neck: supple, no lymphadenopathy, no thyromegaly, no masses Heart: RRR, normal S1, S2, no murmurs Lungs: CTA bilaterally, no wheezes, rhonchi, or rales Abdomen: +bs, soft, non tender, non distended, no masses, no hepatomegaly, no splenomegaly Musculoskeletal: nontender, no swelling, no obvious deformity Extremities: no edema, no cyanosis, no clubbing Pulses: 2+ symmetric, upper and lower extremities, normal cap refill Neurological: alert, oriented x 3, CN2-12 intact, strength normal upper extremities and lower extremities, sensation normal throughout, DTRs 2+ throughout, no cerebellar signs, gait normal Psychiatric: normal affect, behavior normal, pleasant  Breast: defer Gyn: defer Rectal: defer   Medicare Attestation I have personally reviewed: The patient's medical and social history Their use of alcohol, tobacco or illicit drugs Their current medications and supplements The patient's functional ability including ADLs,fall risks, home safety risks, cognitive, and hearing and visual impairment Diet and physical activities Evidence for depression or mood disorders  The patient's weight, height, BMI, and visual acuity have been recorded in the chart.  I have made referrals, counseling, and provided education to the patient based on review of the above and I have provided the patient with a written personalized care plan for preventive services.     Starlyn Skeans, PA-C   09/15/2016

## 2016-10-11 ENCOUNTER — Other Ambulatory Visit: Payer: Self-pay | Admitting: Internal Medicine

## 2016-10-18 ENCOUNTER — Encounter: Payer: Self-pay | Admitting: Internal Medicine

## 2016-10-18 ENCOUNTER — Ambulatory Visit (INDEPENDENT_AMBULATORY_CARE_PROVIDER_SITE_OTHER): Payer: Commercial Managed Care - HMO | Admitting: Internal Medicine

## 2016-10-18 VITALS — BP 134/72 | HR 56 | Temp 97.2°F | Resp 16 | Ht 64.0 in | Wt 167.8 lb

## 2016-10-18 DIAGNOSIS — M25572 Pain in left ankle and joints of left foot: Secondary | ICD-10-CM

## 2016-10-18 DIAGNOSIS — Z79899 Other long term (current) drug therapy: Secondary | ICD-10-CM | POA: Diagnosis not present

## 2016-10-18 DIAGNOSIS — M0579 Rheumatoid arthritis with rheumatoid factor of multiple sites without organ or systems involvement: Secondary | ICD-10-CM | POA: Diagnosis not present

## 2016-10-18 MED ORDER — PREDNISONE 20 MG PO TABS
ORAL_TABLET | ORAL | 1 refills | Status: DC
Start: 2016-10-18 — End: 2017-04-04

## 2016-10-18 NOTE — Progress Notes (Signed)
[facility]   Unk Pinto, M.D.    Uvaldo Bristle. Silverio Lay, P.A.-C      Starlyn Skeans, P.A.-C  [facility]                [address] [place] [id number] Telephone [phone] Telefax [phone] Subjective:    Patient ID: Dana [name], female    DOB: Nov 11, 1941, 75 y.o.   MRN: [id number]  HPI  Patient w/ hx/ RA in remission on MTX and Plaquenil presents with c/o tenderness and pain about the R ankle - medial malleolus area w/o hx/o injury.   Outpatient Medications Prior to Visit  Medication Sig  . Cholecalciferol (VITAMIN D) 2000 UNITS CAPS Take by mouth. Take 4 capsules by mouth once daily   . Cyanocobalamin 500 MCG/0.1ML SOLN Place 1 mL under the tongue daily.   . diazepam (VALIUM) 2 MG tablet Take 1 tablet (2 mg total) by mouth every 8 (eight) hours as needed for anxiety.  Marland Kitchen estradiol (ESTRACE) 1 MG tablet Take 1 tablet (1 mg total) by mouth daily.  . folic acid (FOLVITE) Q000111Q MCG tablet Take 800 mcg by mouth daily.   . furosemide (LASIX) 40 MG tablet Take 1 tablet 2 x/day for fluid retention / ankle swelling  . hydroxychloroquine (PLAQUENIL) 200 MG tablet Take 2 tablets by mouth once daily   . levothyroxine (SYNTHROID, LEVOTHROID) 100 MCG tablet Take 1 tablet (100 mcg total) by mouth daily.  . methotrexate (RHEUMATREX) 2.5 MG tablet TAKE 10 TABLETS BY MOUTH ONCE A WEEK  . montelukast (SINGULAIR) 10 MG tablet Take 1 tablet (10 mg total) by mouth daily.  Marland Kitchen omeprazole (PRILOSEC) 20 MG capsule Take 20 mg by mouth 2 (two) times daily before a meal.  . ondansetron (ZOFRAN ODT) 8 MG disintegrating tablet 8mg  ODT q4 hours prn nausea  . pravastatin (PRAVACHOL) 40 MG tablet Take 1 tablet (40 mg total) by mouth daily.  . propranolol (INDERAL) 80 MG tablet Take 1 tablet (80 mg total) by mouth 3 (three) times daily.  . ranitidine (ZANTAC) 300 MG tablet TAKE 1 TABLET AT BEDTIME  . traMADol (ULTRAM) 50  MG tablet Take 1 tablet (50 mg total) by mouth 4 (four) times daily.  . verapamil (CALAN-SR) 180 MG CR tablet Take 180 mg by mouth.   Allergies  Allergen Reactions  . Codeine Nausea Only  . Mysoline [Primidone] Nausea Only and Other (See Comments)    Over-sedated   Past Medical History:  Diagnosis Date  . Anemia   . Cataract   . Environmental allergies   . Gastric ulcer   . Heartburn   . Hiatal hernia   . Hyperlipidemia   . Hypertension   . Hypothyroidism   . Migraines   . Prediabetes   . Rash october 2012   all over abdomen and breasts  . Rheumatoid arthritis(714.0)   . Tremor, essential    right hand   Review of Systems  10 point systems review negative except as above.    Objective:   Physical Exam  BP 134/72   Pulse (!) 56   Temp 97.2 F (36.2 C)   Resp 16   Ht 5\' 4"  (1.626 m)   Wt 167 lb 12.8 oz (76.1 kg)   BMI 28.80 kg/m   HEENT - Eac's patent. TM's Nl. EOM's full. PERRLA. NasoOroPharynx clear. Neck - supple.  Nl Thyroid. Carotids 2+ & No bruits, nodes, JVD Chest - Clear equal BS w/o Rales, rhonchi, wheezes. Cor - Nl HS. RRR w/o sig MGR. PP 2(+). No edema. Abd - No palpable organomegaly, masses or tenderness. BS nl. MS- FROM w/o deformities.  (-) Homans's Rt and w/o edema of the ankles. Muscle power, tone and bulk Nl. Gait w/ sl limp favoring the Right. Noted tenderness about the R ankle medial malleolus w/o signs of rash, STS, synovitis, erythema or warmth.  Neuro - No obvious Cr N abnormalities. Sensory, motor and Cerebellar functions appear Nl w/o focal abnormalities.    Assessment & Plan:   1. Pain of joint of left ankle and foot  Empiric treatment with  - predniSONE (DELTASONE) 20 MG tablet; 1 tab 3 x day for 3 days, then 1 tab 2 x day for 3 days, then 1 tab 1 x day for 5 days  Dispense: 20 tablet; Refill: 1  - Discussed meds/SE's

## 2016-11-05 ENCOUNTER — Other Ambulatory Visit: Payer: Self-pay | Admitting: Internal Medicine

## 2016-12-02 DIAGNOSIS — Z79899 Other long term (current) drug therapy: Secondary | ICD-10-CM | POA: Diagnosis not present

## 2016-12-02 DIAGNOSIS — M059 Rheumatoid arthritis with rheumatoid factor, unspecified: Secondary | ICD-10-CM | POA: Diagnosis not present

## 2016-12-02 DIAGNOSIS — M1990 Unspecified osteoarthritis, unspecified site: Secondary | ICD-10-CM | POA: Diagnosis not present

## 2016-12-02 DIAGNOSIS — Z13820 Encounter for screening for osteoporosis: Secondary | ICD-10-CM | POA: Diagnosis not present

## 2016-12-28 ENCOUNTER — Encounter: Payer: Self-pay | Admitting: Internal Medicine

## 2016-12-28 ENCOUNTER — Other Ambulatory Visit: Payer: Self-pay | Admitting: Internal Medicine

## 2016-12-28 ENCOUNTER — Ambulatory Visit (INDEPENDENT_AMBULATORY_CARE_PROVIDER_SITE_OTHER): Payer: Medicare Other | Admitting: Internal Medicine

## 2016-12-28 VITALS — BP 110/74 | HR 64 | Temp 97.5°F | Resp 16 | Ht 63.5 in | Wt 167.8 lb

## 2016-12-28 DIAGNOSIS — R7303 Prediabetes: Secondary | ICD-10-CM

## 2016-12-28 DIAGNOSIS — Z79899 Other long term (current) drug therapy: Secondary | ICD-10-CM

## 2016-12-28 DIAGNOSIS — N390 Urinary tract infection, site not specified: Secondary | ICD-10-CM | POA: Diagnosis not present

## 2016-12-28 DIAGNOSIS — E039 Hypothyroidism, unspecified: Secondary | ICD-10-CM

## 2016-12-28 DIAGNOSIS — Z136 Encounter for screening for cardiovascular disorders: Secondary | ICD-10-CM

## 2016-12-28 DIAGNOSIS — M069 Rheumatoid arthritis, unspecified: Secondary | ICD-10-CM

## 2016-12-28 DIAGNOSIS — R6889 Other general symptoms and signs: Secondary | ICD-10-CM

## 2016-12-28 DIAGNOSIS — Z0001 Encounter for general adult medical examination with abnormal findings: Secondary | ICD-10-CM | POA: Diagnosis not present

## 2016-12-28 DIAGNOSIS — Z1211 Encounter for screening for malignant neoplasm of colon: Secondary | ICD-10-CM

## 2016-12-28 DIAGNOSIS — E782 Mixed hyperlipidemia: Secondary | ICD-10-CM | POA: Diagnosis not present

## 2016-12-28 DIAGNOSIS — E559 Vitamin D deficiency, unspecified: Secondary | ICD-10-CM | POA: Diagnosis not present

## 2016-12-28 DIAGNOSIS — Z1212 Encounter for screening for malignant neoplasm of rectum: Secondary | ICD-10-CM

## 2016-12-28 DIAGNOSIS — I10 Essential (primary) hypertension: Secondary | ICD-10-CM | POA: Diagnosis not present

## 2016-12-28 DIAGNOSIS — G250 Essential tremor: Secondary | ICD-10-CM

## 2016-12-28 LAB — LIPID PANEL
CHOL/HDL RATIO: 2.5 ratio (ref <5.0)
Cholesterol: 155 mg/dL (ref <200)
HDL: 61 mg/dL (ref >50)
LDL Cholesterol: 70 mg/dL (ref <100)
Triglycerides: 122 mg/dL (ref <150)
VLDL: 24 mg/dL (ref <30)

## 2016-12-28 LAB — CBC WITH DIFFERENTIAL/PLATELET
BASOS ABS: 0 {cells}/uL (ref 0–200)
Basophils Relative: 0 %
EOS PCT: 2 %
Eosinophils Absolute: 118 cells/uL (ref 15–500)
HCT: 38.6 % (ref 35.0–45.0)
Hemoglobin: 12.4 g/dL (ref 11.7–15.5)
Lymphocytes Relative: 27 %
Lymphs Abs: 1593 cells/uL (ref 850–3900)
MCH: 31.2 pg (ref 27.0–33.0)
MCHC: 32.1 g/dL (ref 32.0–36.0)
MCV: 97.2 fL (ref 80.0–100.0)
MONOS PCT: 13 %
MPV: 9.2 fL (ref 7.5–12.5)
Monocytes Absolute: 767 cells/uL (ref 200–950)
NEUTROS PCT: 58 %
Neutro Abs: 3422 cells/uL (ref 1500–7800)
PLATELETS: 271 10*3/uL (ref 140–400)
RBC: 3.97 MIL/uL (ref 3.80–5.10)
RDW: 15.3 % — ABNORMAL HIGH (ref 11.0–15.0)
WBC: 5.9 10*3/uL (ref 3.8–10.8)

## 2016-12-28 LAB — HEPATIC FUNCTION PANEL
ALBUMIN: 3.8 g/dL (ref 3.6–5.1)
ALT: 10 U/L (ref 6–29)
AST: 18 U/L (ref 10–35)
Alkaline Phosphatase: 63 U/L (ref 33–130)
BILIRUBIN DIRECT: 0.1 mg/dL (ref <=0.2)
BILIRUBIN TOTAL: 0.5 mg/dL (ref 0.2–1.2)
Indirect Bilirubin: 0.4 mg/dL (ref 0.2–1.2)
Total Protein: 6.1 g/dL (ref 6.1–8.1)

## 2016-12-28 LAB — TSH: TSH: 1.1 m[IU]/L

## 2016-12-28 LAB — BASIC METABOLIC PANEL WITH GFR
BUN: 16 mg/dL (ref 7–25)
CALCIUM: 9.4 mg/dL (ref 8.6–10.4)
CO2: 31 mmol/L (ref 20–31)
CREATININE: 0.97 mg/dL — AB (ref 0.60–0.93)
Chloride: 103 mmol/L (ref 98–110)
GFR, Est African American: 65 mL/min (ref >=60)
GFR, Est Non African American: 57 mL/min — ABNORMAL LOW (ref >=60)
Glucose, Bld: 75 mg/dL (ref 65–99)
Potassium: 4 mmol/L (ref 3.5–5.3)
SODIUM: 142 mmol/L (ref 135–146)

## 2016-12-28 MED ORDER — DIAZEPAM 5 MG PO TABS: ORAL_TABLET | ORAL | 1 refills | Status: AC

## 2016-12-28 NOTE — Patient Instructions (Signed)
Rheumatoid Arthritis Rheumatoid arthritis (RA) is a long-term (chronic) disease that causes inflammation in your joints. RA may start slowly. It usually affects the small joints of the hands and feet. Usually, the same joints are affected on both sides of your body. Inflammation from RA can also affect other parts of your body, including your heart, eyes, or lungs. RA is an autoimmune disease. That means that your body's defense system (immune system) mistakenly attacks healthy body tissues. There is no cure for RA, but medicines can help your symptoms and halt or slow down the progression of the disease. What are the causes? The exact cause of RA is not known. What increases the risk? This condition is more likely to develop in:  Women.  People who have a family history of RA or other autoimmune diseases. What are the signs or symptoms? Symptoms of this condition vary from person to person. Symptoms usually start gradually. They are often worse in the morning. The first symptom may be morning stiffness that lasts longer than 30 minutes. As RA progresses, symptoms may include:  Pain, stiffness, swelling, warmth, and tenderness in joints on both sides of your body.  Loss of energy.  Loss of appetite.  Weight loss.  Low-grade fever.  Dry eyes and dry mouth.  Firm lumps (rheumatoid nodules) that grow beneath your skin in areas such as your forearm bones near your elbows and on your hands.  Changes in the appearance of joints (deformity) and loss of joint function. Symptoms of RA often come and go. Sometimes, symptoms get worse for a period of time. These are called flares. How is this diagnosed? This condition is diagnosed based on your symptoms, medical history, and physical exam. You may have X-rays or MRI to check for the type of joint changes that are caused by RA. You may also have blood tests to look for:  Proteins (antibodies) that your immune system may make if you have RA.  They include rheumatoid factor (RF) and anti-CCP.  When blood tests show these proteins, you are said to have "seropositive RA."  When blood tests do not show these proteins, you may have "seronegative RA."  Inflammation in your blood.  A low number of red blood cells (anemia). How is this treated? The goals of treatment are to relieve pain, reduce inflammation, and slow down or stop joint damage and disability. Treatment may include:  Lifestyle changes. It is important to rest, eat a healthy diet, and exercise.  Medicines. Your health care provider may adjust your medicines every 3 months until treatment goals are reached. Common medicines include:  Pain relievers (analgesics).  Corticosteroids and NSAIDs to reduce inflammation.  Disease-modifying antirheumatic drugs (DMARDs) to try to slow the course of the disease.  Biologic response modifiers to reduce inflammation and damage.  Physical therapy and occupational therapy.  Surgery, if you have severe joint damage. Joint replacement or fusing of joints may be needed. Your health care provider will work with you to identify the best treatment option for you based on assessment of the overall disease activity in your body. Follow these instructions at home:  Take over-the-counter and prescription medicines only as told by your health care provider.  Start an exercise program as told by your health care provider.  Rest when you are having a flare.  Return to your normal activities as told by your health care provider. Ask your health care provider what activities are safe for you.  Keep all follow-up visits as told  by your health care provider. This is important. Where to find more information:  [employer]: www.rheumatology.[place]: www.arthritis.org Contact a health care provider if:  You have a flare-up of RA symptoms.  You have a fever.  You have side effects from your  medicines. Get help right away if:  You have chest pain.  You have trouble breathing.  You quickly develop a hot, painful joint that is more severe than your usual joint aches. This information is not intended to replace advice given to you by your health care provider. Make sure you discuss any questions you have with your health care provider. Document Released: 11/04/2000 Document Revised: 04/12/2016 Document Reviewed: 08/20/2015 Elsevier Interactive Patient Education  2017 [place].  +++++++++++++++++++++++++++++++++ Preventive Care for Adults  A healthy lifestyle and preventive care can promote health and wellness. Preventive health guidelines for women include the following key practices.  A routine yearly physical is a good way to check with your health care provider about your health and preventive screening. It is a chance to share any concerns and updates on your health and to receive a thorough exam.  Visit your dentist for a routine exam and preventive care every 6 months. Brush your teeth twice a day and floss once a day. Good oral hygiene prevents tooth decay and gum disease.  The frequency of eye exams is based on your age, health, family medical history, use of contact lenses, and other factors. Follow your health care provider's recommendations for frequency of eye exams.  Eat a healthy diet. Foods like vegetables, fruits, whole grains, low-fat dairy products, and lean protein foods contain the nutrients you need without too many calories. Decrease your intake of foods high in solid fats, added sugars, and salt. Eat the right amount of calories for you.Get information about a proper diet from your health care provider, if necessary.  Regular physical exercise is one of the most important things you can do for your health. Most adults should get at least 150 minutes of moderate-intensity exercise (any activity that increases your heart rate and causes you to sweat) each  week. In addition, most adults need muscle-strengthening exercises on 2 or more days a week.  Maintain a healthy weight. The body mass index (BMI) is a screening tool to identify possible weight problems. It provides an estimate of body fat based on height and weight. Your health care provider can find [name] and can help you achieve or maintain a healthy weight.For adults 20 years and older:  A BMI below 18.5 is considered underweight.  A BMI of 18.5 to 24.9 is normal.  A BMI of 25 to 29.9 is considered overweight.  A BMI of 30 and above is considered obese.  Maintain normal blood lipids and cholesterol levels by exercising and minimizing your intake of saturated fat. Eat a balanced diet with plenty of fruit and vegetables. If your lipid or cholesterol levels are high, you are over 50, or you are at high risk for heart disease, you may need your cholesterol levels checked more frequently.Ongoing high lipid and cholesterol levels should be treated with medicines if diet and exercise are not working.  If you smoke, find out from your health care provider how to quit. If you do not use tobacco, do not start.  Lung cancer screening is recommended for adults aged 54-80 years who are at high risk for developing lung cancer because of a history of smoking. A yearly low-dose CT  scan of the lungs is recommended for people who have at least a 30-pack-year history of smoking and are a current smoker or have quit within the past 15 years. A pack year of smoking is smoking an average of 1 pack of cigarettes a day for 1 year (for example: 1 pack a day for 30 years or 2 packs a day for 15 years). Yearly screening should continue until the smoker has stopped smoking for at least 15 years. Yearly screening should be stopped for people who develop a health problem that would prevent them from having lung cancer treatment.  Avoid use of street drugs. Do not share needles with anyone. Ask for help if you need  support or instructions about stopping the use of drugs.  High blood pressure causes heart disease and increases the risk of stroke.  Ongoing high blood pressure should be treated with medicines if weight loss and exercise do not work.  If you are 66-43 years old, ask your health care provider if you should take aspirin to prevent strokes.  Diabetes screening involves taking a blood sample to check your fasting blood sugar level. This should be done once every 3 years, after age 51, if you are within normal weight and without risk factors for diabetes. Testing should be considered at a younger age or be carried out more frequently if you are overweight and have at least 1 risk factor for diabetes.  Breast cancer screening is essential preventive care for women. You should practice "breast self-awareness." This means understanding the normal appearance and feel of your breasts and may include breast self-examination. Any changes detected, no matter how small, should be reported to a health care provider. Women in their 46s and 30s should have a clinical breast exam (CBE) by a health care provider as part of a regular health exam every 1 to 3 years. After age 83, women should have a CBE every year. Starting at age 71, women should consider having a mammogram (breast X-ray test) every year. Women who have a family history of breast cancer should talk to their health care provider about genetic screening. Women at a high risk of breast cancer should talk to their health care providers about having an MRI and a mammogram every year.  Breast cancer gene (BRCA)-related cancer risk assessment is recommended for women who have family members with BRCA-related cancers. BRCA-related cancers include breast, ovarian, tubal, and peritoneal cancers. Having family members with these cancers may be associated with an increased risk for harmful changes (mutations) in the breast cancer genes BRCA1 and BRCA2. Results of the  assessment will determine the need for genetic counseling and BRCA1 and BRCA2 testing.  Routine pelvic exams to screen for cancer are no longer recommended for nonpregnant women who are considered low risk for cancer of the pelvic organs (ovaries, uterus, and vagina) and who do not have symptoms. Ask your health care provider if a screening pelvic exam is right for you.  If you have had past treatment for cervical cancer or a condition that could lead to cancer, you need Pap tests and screening for cancer for at least 20 years after your treatment. If Pap tests have been discontinued, your risk factors (such as having a new sexual partner) need to be reassessed to determine if screening should be resumed. Some women have medical problems that increase the chance of getting cervical cancer. In these cases, your health care provider may recommend more frequent screening and Pap tests.  Colorectal cancer can be detected and often prevented. Most routine colorectal cancer screening begins at the age of 52 years and continues through age 47 years. However, your health care provider may recommend screening at an earlier age if you have risk factors for colon cancer. On a yearly basis, your health care provider may provide home test kits to check for hidden blood in the stool. Use of a small camera at the end of a tube, to directly examine the colon (sigmoidoscopy or colonoscopy), can detect the earliest forms of colorectal cancer. Talk to your health care provider about this at age 61, when routine screening begins. Direct exam of the colon should be repeated every 5-10 years through age 37 years, unless early forms of pre-cancerous polyps or small growths are found.  Osteoporosis is a disease in which the bones lose minerals and strength with aging. This can result in serious bone fractures or breaks. The risk of osteoporosis can be identified using a bone density scan. Women ages 35 years and over and women  at risk for fractures or osteoporosis should discuss screening with their health care providers. Ask your health care provider whether you should take a calcium supplement or vitamin D to reduce the rate of osteoporosis.  Menopause can be associated with physical symptoms and risks. Hormone replacement therapy is available to decrease symptoms and risks. You should talk to your health care provider about whether hormone replacement therapy is right for you.  Use sunscreen. Apply sunscreen liberally and repeatedly throughout the day. You should seek shade when your shadow is shorter than you. Protect yourself by wearing long sleeves, pants, a wide-brimmed hat, and sunglasses year round, whenever you are outdoors.  Once a month, do a whole body skin exam, using a mirror to look at the skin on your back. Tell your health care provider of new moles, moles that have irregular borders, moles that are larger than a pencil eraser, or moles that have changed in shape or color.  Stay current with required vaccines (immunizations).  Influenza vaccine. All adults should be immunized every year.  Tetanus, diphtheria, and acellular pertussis (Td, Tdap) vaccine. Pregnant women should receive 1 dose of Tdap vaccine during each pregnancy. The dose should be obtained regardless of the length of time since the last dose. Immunization is preferred during the 27th-36th week of gestation. An adult who has not previously received Tdap or who does not know her vaccine status should receive 1 dose of Tdap. This initial dose should be followed by tetanus and diphtheria toxoids (Td) booster doses every 10 years. Adults with an unknown or incomplete history of completing a 3-dose immunization series with Td-containing vaccines should begin or complete a primary immunization series including a Tdap dose. Adults should receive a Td booster every 10 years.    Zoster vaccine. One dose is recommended for adults aged 87 years or  older unless certain conditions are present.    Pneumococcal 13-valent conjugate (PCV13) vaccine. When indicated, a person who is uncertain of her immunization history and has no record of immunization should receive the PCV13 vaccine. An adult aged 40 years or older who has certain medical conditions and has not been previously immunized should receive 1 dose of PCV13 vaccine. This PCV13 should be followed with a dose of pneumococcal polysaccharide (PPSV23) vaccine. The PPSV23 vaccine dose should be obtained at least 8 weeks after the dose of PCV13 vaccine. An adult aged 26 years or older who has certain medical conditions  and previously received 1 or more doses of PPSV23 vaccine should receive 1 dose of PCV13. The PCV13 vaccine dose should be obtained 1 or more years after the last PPSV23 vaccine dose.    Pneumococcal polysaccharide (PPSV23) vaccine. When PCV13 is also indicated, PCV13 should be obtained first. All adults aged 34 years and older should be immunized. An adult younger than age 31 years who has certain medical conditions should be immunized. Any person who resides in a nursing home or long-term care facility should be immunized. An adult smoker should be immunized. People with an immunocompromised condition and certain other conditions should receive both PCV13 and PPSV23 vaccines. People with human immunodeficiency virus (HIV) infection should be immunized as soon as possible after diagnosis. Immunization during chemotherapy or radiation therapy should be avoided. Routine use of PPSV23 vaccine is not recommended for American Indians, [place] Natives, or people younger than 65 years unless there are medical conditions that require PPSV23 vaccine. When indicated, people who have unknown immunization and have no record of immunization should receive PPSV23 vaccine. One-time revaccination 5 years after the first dose of PPSV23 is recommended for people aged 19-64 years who have chronic kidney  failure, nephrotic syndrome, asplenia, or immunocompromised conditions. People who received 1-2 doses of PPSV23 before age 28 years should receive another dose of PPSV23 vaccine at age 87 years or later if at least 5 years have passed since the previous dose. Doses of PPSV23 are not needed for people immunized with PPSV23 at or after age 7 years.   Preventive Services / Frequency  Ages 64 years and over  Blood pressure check.  Lipid and cholesterol check.  Lung cancer screening. / Every year if you are aged 29-80 years and have a 30-pack-year history of smoking and currently smoke or have quit within the past 15 years. Yearly screening is stopped once you have quit smoking for at least 15 years or develop a health problem that would prevent you from having lung cancer treatment.  Clinical breast exam.** / Every year after age 4 years.  BRCA-related cancer risk assessment.** / For women who have family members with a BRCA-related cancer (breast, ovarian, tubal, or peritoneal cancers).  Mammogram.** / Every year beginning at age 108 years and continuing for as long as you are in good health. Consult with your health care provider.  Pap test.** / Every 3 years starting at age 37 years through age 56 or 47 years with 3 consecutive normal Pap tests. Testing can be stopped between 65 and 70 years with 3 consecutive normal Pap tests and no abnormal Pap or HPV tests in the past 10 years.  Fecal occult blood test (FOBT) of stool. / Every year beginning at age 31 years and continuing until age 64 years. You may not need to do this test if you get a colonoscopy every 10 years.  Flexible sigmoidoscopy or colonoscopy.** / Every 5 years for a flexible sigmoidoscopy or every 10 years for a colonoscopy beginning at age 37 years and continuing until age 5 years.  Hepatitis C blood test.** / For all people born from [birth year] through 1967 and any individual with known risks for hepatitis C.  Osteoporosis  screening.** / A one-time screening for women ages 45 years and over and women at risk for fractures or osteoporosis.  Skin self-exam. / Monthly.  Influenza vaccine. / Every year.  Tetanus, diphtheria, and acellular pertussis (Tdap/Td) vaccine.** / 1 dose of Td every 10 years.  Zoster vaccine.** /  1 dose for adults aged 49 years or older.  Pneumococcal 13-valent conjugate (PCV13) vaccine.** / Consult your health care provider.  Pneumococcal polysaccharide (PPSV23) vaccine.** / 1 dose for all adults aged 78 years and older. Screening for abdominal aortic aneurysm (AAA)  by ultrasound is recommended for people who have history of high blood pressure or who are current or former smokers. ++++++++++++++++++++ Recommend Adult Low Dose Aspirin or  coated  Aspirin 81 mg daily  To reduce risk of Colon Cancer 20 %,  Skin Cancer 26 % ,  Melanoma 46%  and  Pancreatic cancer 60% ++++++++++++++++++++ Vitamin D goal  is between 70-100.  Please make sure that you are taking your Vitamin D as directed.  It is very important as a natural anti-inflammatory  helping hair, skin, and nails, as well as reducing stroke and heart attack risk.  It helps your bones and helps with mood. It also decreases numerous cancer risks so please take it as directed.  Low Vit D is associated with a 200-300% higher risk for CANCER  and 200-300% higher risk for HEART   ATTACK  &  STROKE.   .....................................Marland Kitchen It is also associated with higher death rate at younger ages,  autoimmune diseases like Rheumatoid arthritis, Lupus, Multiple Sclerosis.    Also many other serious conditions, like depression, Alzheimer's Dementia, infertility, muscle aches, fatigue, fibromyalgia - just to name a few. ++++++++++++++++++ Recommend the book "The END of DIETING" by Dr Excell Seltzer  & the book "The END of DIABETES " by Dr Excell Seltzer At [email or web address] - get book & Audio CD's    Being diabetic has a  300% increased  risk for heart attack, stroke, cancer, and alzheimer- type vascular dementia. It is very important that you work harder with diet by avoiding all foods that are white. Avoid white rice (brown & wild rice is OK), white potatoes (sweetpotatoes in moderation is OK), White bread or wheat bread or anything made out of white flour like bagels, donuts, rolls, buns, biscuits, cakes, pastries, cookies, pizza crust, and pasta (made from white flour & egg whites) - vegetarian pasta or spinach or wheat pasta is OK. Multigrain breads like Arnold's or [employer], or multigrain sandwich thins or flatbreads.  Diet, exercise and weight loss can reverse and cure diabetes in the early stages.  Diet, exercise and weight loss is very important in the control and prevention of complications of diabetes which affects every system in your body, ie. Brain - dementia/stroke, eyes - glaucoma/blindness, heart - heart attack/heart failure, kidneys - dialysis, stomach - gastric paralysis, intestines - malabsorption, nerves - severe painful neuritis, circulation - gangrene & loss of a leg(s), and finally cancer and Alzheimers.    I recommend avoid fried & greasy foods,  sweets/candy, white rice (brown or wild rice or Quinoa is OK), white potatoes (sweet potatoes are OK) - anything made from white flour - bagels, doughnuts, rolls, buns, biscuits,white and wheat breads, pizza crust and traditional pasta made of white flour & egg white(vegetarian pasta or spinach or wheat pasta is OK).  Multi-grain bread is OK - like multi-grain flat bread or sandwich thins. Avoid alcohol in excess. Exercise is also important.    Eat all the vegetables you want - avoid meat, especially red meat and dairy - especially cheese.  Cheese is the most concentrated form of trans-fats which is the worst thing to clog up our arteries. Veggie cheese is OK which can be found in the fresh produce section  at [employer] or [employer] or  [employer]  +++++++++++++++++++ DASH Eating Plan  DASH stands for "Dietary Approaches to Stop Hypertension."   The DASH eating plan is a healthy eating plan that has been shown to reduce high blood pressure (hypertension). Additional health benefits may include reducing the risk of type 2 diabetes mellitus, heart disease, and stroke. The DASH eating plan may also help with weight loss. WHAT DO I NEED TO KNOW ABOUT THE DASH EATING PLAN? For the DASH eating plan, you will follow these general guidelines:  Choose foods with a percent daily value for sodium of less than 5% (as listed on the food label).  Use salt-free seasonings or herbs instead of table salt or sea salt.  Check with your health care provider or pharmacist before using salt substitutes.  Eat lower-sodium products, often labeled as "lower sodium" or "no salt added."  Eat fresh foods.  Eat more vegetables, fruits, and low-fat dairy products.  Choose whole grains. Look for the word "whole" as the first word in the ingredient list.  Choose fish   Limit sweets, desserts, sugars, and sugary drinks.  Choose heart-healthy fats.  Eat veggie cheese   Eat more home-cooked food and less restaurant, buffet, and fast food.  Limit fried foods.  Cook foods using methods other than frying.  Limit canned vegetables. If you do use them, rinse them well to decrease the sodium.  When eating at a restaurant, ask that your food be prepared with less salt, or no salt if possible.                      WHAT FOODS CAN I EAT? Read Dr Fara Olden Fuhrman's books on The End of Dieting & The End of Diabetes  Grains Whole grain or whole wheat bread. Brown rice. Whole grain or whole wheat pasta. Quinoa, bulgur, and whole grain cereals. Low-sodium cereals. Corn or whole wheat flour tortillas. Whole grain cornbread. Whole grain crackers. Low-sodium crackers.  Vegetables Fresh or frozen vegetables (raw, steamed, roasted, or grilled). Low-sodium or  reduced-sodium tomato and vegetable juices. Low-sodium or reduced-sodium tomato sauce and paste. Low-sodium or reduced-sodium canned vegetables.   Fruits All fresh, canned (in natural juice), or frozen fruits.  Protein Products  All fish and seafood.  Dried beans, peas, or lentils. Unsalted nuts and seeds. Unsalted canned beans.  Dairy Low-fat dairy products, such as skim or 1% milk, 2% or reduced-fat cheeses, low-fat ricotta or cottage cheese, or plain low-fat yogurt. Low-sodium or reduced-sodium cheeses.  Fats and Oils Tub margarines without trans fats. Light or reduced-fat mayonnaise and salad dressings (reduced sodium). Avocado. Safflower, olive, or canola oils. Natural peanut or almond butter.  Other Unsalted popcorn and pretzels. The items listed above may not be a complete list of recommended foods or beverages. Contact your dietitian for more options.  +++++++++++++++  WHAT FOODS ARE NOT RECOMMENDED? Grains/ White flour or wheat flour White bread. White pasta. White rice. Refined cornbread. Bagels and croissants. Crackers that contain trans fat.  Vegetables  Creamed or fried vegetables. Vegetables in a . Regular canned vegetables. Regular canned tomato sauce and paste. Regular tomato and vegetable juices.  Fruits Dried fruits. Canned fruit in light or heavy syrup. Fruit juice.  Meat and Other Protein Products Meat in general - RED meat & White meat.  Fatty cuts of meat. Ribs, chicken wings, all processed meats as bacon, sausage, bologna, salami, fatback, hot dogs, bratwurst and packaged luncheon meats.  Dairy Whole or  2% milk, cream, half-and-half, and cream cheese. Whole-fat or sweetened yogurt. Full-fat cheeses or blue cheese. Non-dairy creamers and whipped toppings. Processed cheese, cheese spreads, or cheese curds.  Condiments Onion and garlic salt, seasoned salt, table salt, and sea salt. Canned and packaged gravies. Worcestershire sauce. Tartar sauce. Barbecue  sauce. Teriyaki sauce. Soy sauce, including reduced sodium. Steak sauce. Fish sauce. Oyster sauce. Cocktail sauce. Horseradish. Ketchup and mustard. Meat flavorings and tenderizers. Bouillon cubes. Hot sauce. Tabasco sauce. Marinades. Taco seasonings. Relishes.  Fats and Oils Butter, stick margarine, lard, shortening and bacon fat. Coconut, palm kernel, or palm oils. Regular salad dressings.  Pickles and olives. Salted popcorn and pretzels.  The items listed above may not be a complete list of foods and beverages to avoid.

## 2016-12-28 NOTE — Progress Notes (Signed)
[facility] Unk Pinto, M.D.    Uvaldo Bristle. Silverio Lay, P.A.-C      Starlyn Skeans, P.A.-C  [facility]                [address] [place] [id number] Telephone [phone] Telefax [phone]  Annual Screening/Preventative Visit & Comprehensive Evaluation &  Examination     This very nice 76 y.o. [facility]   presents for a Screening/Preventative Visit & comprehensive evaluation and management of multiple medical co-morbidities.  Patient has been followed for HTN, Prediabetes, Hyperlipidemia, Hypothyroidism  and Vitamin D Deficiency.Patient also has hx/o Rheumatoid Arthritis predating since 2001 and is also followed at [facility] /Rheumatology and is stable/controlled on MTX, hydroxychloroquine & recent add'n of  Sulfasalazine to her regimen.      Patient also has long standing Hereditary Essential tremor and has been on Propranolol and low dose Diazepam and feels her tremor is worse as she no longer is able to write/sign checks and has more speech difficulty with enunciating understandably.       HTN predates since 1998. Patient's BP has been controlled at home and patient denies any cardiac symptoms as chest pain, palpitations, shortness of breath, dizziness or ankle swelling. Today's BP is at goal - 110/74.      Patient's hyperlipidemia is controlled with diet and medications. Patient denies myalgias or other medication SE's. Last lipids were at goal: Lab Results  Component Value Date   CHOL 153 09/15/2016   HDL 58 09/15/2016   LDLCALC 69 09/15/2016   TRIG 132 09/15/2016   CHOLHDL 2.6 09/15/2016      Patient has prediabetes (A1c 5.7% in 2012)  and patient denies reactive hypoglycemic symptoms, visual blurring, diabetic polys  or paresthesias. Last A1c was at goal: Lab Results  Component Value Date   HGBA1C 5.2 09/15/2016      Finally, patient has history of Vitamin D Deficiency ("17" in 2009)  and  last Vitamin D was still low (goal 70-100): Lab Results  Component Value Date   VD25OH 41 03/14/2016   Current Outpatient Prescriptions on File Prior to Visit  Medication Sig  . VITAMIN D 2000 UNITS  Take by mouth. Take 4 capsules by mouth once daily   . Cyanocobalamin 500  SOLN Place under the tongue daily.   Marland Kitchen estradiol  1 MG tablet Take 1 tablet (1 mg total) by mouth daily.  . folic acid  Q000111Q MCG tablet Take 800 mcg by mouth daily.   . furosemide  40 MG tablet Take 1 tablet 2 x/day for fluid retention / ankle swelling  . hydroxychloroquine  200 MG  Take 2 tablets by mouth once daily   . levothyroxine 100 MCG  TAKE 1 TABLET EVERY DAY  . methotrexate  2.5 MG tablet TAKE 10 TABLETS BY MOUTH ONCE A WEEK  . montelukast 10 MG tablet Take 1 tablet (10 mg total) by mouth daily.  . ondansetron   8 MG ODT  q4 hours prn nausea  . Pravastatin 40 MG tablet Take 1 tablet (40 mg total) by mouth daily.  . propranolol  80 MG tablet Take 1 tablet (80 mg total) by mouth 3 (three) times daily.  . ranitidine 300 MG tablet TAKE 1 TABLET AT BEDTIME  . traMADol  50 MG tablet Take 1 tablet (50 mg total) by mouth 4 (four) times daily.  Allergies  Allergen Reactions  . Codeine Nausea Only  . Mysoline [Primidone] Nausea Only and Other (See Comments)    Over-sedated   Past Medical History:  Diagnosis Date  . Anemia   . Cataract   . Environmental allergies   . Gastric ulcer   . Heartburn   . Hiatal hernia   . Hyperlipidemia   . Hypertension   . Hypothyroidism   . Migraines   . Prediabetes   . Rash october 2012   all over abdomen and breasts  . Rheumatoid arthritis(714.0)   . Tremor, essential    right hand   Health Maintenance  Topic Date Due  . TETANUS/TDAP  11/10/2025  . INFLUENZA VACCINE  Completed  . DEXA SCAN  Completed  . ZOSTAVAX  Completed  . PNA vac Low Risk Adult  Completed   Immunization History  Administered Date(s) Administered  . DT 02/05/2015  . Influenza Split  09/12/2013  . Influenza, High Dose Seasonal PF 10/28/2014, 08/18/2016  . Pneumococcal Conjugate-13 05/12/2015  . Pneumococcal-Unspecified 11/21/2006  . Td 11/22/2003  . Varicella 06/23/2011  . Zoster 06/23/2011   Past Surgical History:  Procedure Laterality Date  . ABDOMINAL HYSTERECTOMY    . CATARACT EXTRACTION, BILATERAL    . CHOLECYSTECTOMY  08/31/11   lap chole   . COLONOSCOPY    . TUBAL LIGATION     Family History  Problem Relation Age of Onset  . Colon cancer Neg Hx   . Heart disease Mother   . Diabetes Mother   . Heart disease Maternal Aunt     x3   . Heart disease Maternal Uncle     x 4  . Diabetes Sister     borderline  . Heart disease Father    Social History  Substance Use Topics  . Smoking status: Former Smoker    Types: Cigarettes    Quit date: 09/18/1984  . Smokeless tobacco: Never Used     Comment: stopped around 25 years ago  . Alcohol use No    ROS Constitutional: Denies fever, chills, weight loss/gain, headaches, insomnia,  night sweats, and change in appetite. Does c/o fatigue. Eyes: Denies redness, blurred vision, diplopia, discharge, itchy, watery eyes.  ENT: Denies discharge, congestion, post nasal drip, epistaxis, sore throat, earache, hearing loss, dental pain, Tinnitus, Vertigo, Sinus pain, snoring.  Cardio: Denies chest pain, palpitations, irregular heartbeat, syncope, dyspnea, diaphoresis, orthopnea, PND, claudication, edema Respiratory: denies cough, dyspnea, DOE, pleurisy, hoarseness, laryngitis, wheezing.  Gastrointestinal: Denies dysphagia, heartburn, reflux, water brash, pain, cramps, nausea, vomiting, bloating, diarrhea, constipation, hematemesis, melena, hematochezia, jaundice, hemorrhoids Genitourinary: Denies dysuria, frequency, urgency, nocturia, hesitancy, discharge, hematuria, flank pain Breast: Breast lumps, nipple discharge, bleeding.  Musculoskeletal: Denies arthralgia, myalgia, stiffness, Jt. Swelling, pain, limp, and  strain/sprain. Denies falls. Skin: Denies puritis, rash, hives, warts, acne, eczema, changing in skin lesion Neuro: No weakness, tremor, incoordination, spasms, paresthesia, pain Psychiatric: Denies confusion, memory loss, sensory loss. Denies Depression. Endocrine: Denies change in weight, skin, hair change, nocturia, and paresthesia, diabetic polys, visual blurring, hyper / hypo glycemic episodes.  Heme/Lymph: No excessive bleeding, bruising, enlarged lymph nodes.  Physical Exam  BP 110/74   Pulse 64   Temp 97.5 F (36.4 C)   Resp 16   Ht 5' 3.5" (1.613 m)   Wt 167 lb 12.8 oz (76.1 kg)   BMI 29.26 kg/m   General Appearance: Well nourished and in no apparent distress.  Eyes: PERRLA, EOMs, conjunctiva no swelling or erythema, normal fundi and vessels.  Sinuses: No frontal/maxillary tenderness ENT/Mouth: EACs patent / TMs  nl. Nares clear without erythema, swelling, mucoid exudates. Oral hygiene is good. No erythema, swelling, or exudate. Tongue normal, non-obstructing. Tonsils not swollen or erythematous. Hearing normal.  Neck: Supple, thyroid normal. No bruits, nodes or JVD. Respiratory: Respiratory effort normal.  BS equal and clear bilateral without rales, rhonci, wheezing or stridor. Cardio: Heart sounds are normal with regular rate and rhythm and no murmurs, rubs or gallops. Peripheral pulses are normal and equal bilaterally without edema. No aortic or femoral bruits. Chest: symmetric with normal excursions and percussion. Breasts: Symmetric, without lumps, nipple discharge, retractions, or fibrocystic changes.  Abdomen: Flat, soft with bowel sounds active. Nontender, no guarding, rebound, hernias, masses, or organomegaly.  Lymphatics: Non tender without lymphadenopathy.  Musculoskeletal: Full ROM all peripheral extremities, joint stability, 5/5 strength, and normal gait. Skin: Warm and dry without rashes, lesions, cyanosis, clubbing or  ecchymosis.  Neuro: Cranial nerves intact,  reflexes equal bilaterally. Normal muscle tone, no cerebellar symptoms. Sensation intact. High frequency low amplitude action tremor of the hands, halting speech and mild head titubation. Pysch: Alert and oriented X 3, normal affect, Insight and Judgment appropriate.   Assessment and Plan  1. Annual Preventative Screening Examination  2. Essential hypertension  - Microalbumin / creatinine urine ratio - EKG 12-Lead - Urinalysis, Routine w reflex microscopic - CBC with Differential/Platelet - BASIC METABOLIC PANEL WITH GFR - TSH  3. Mixed hyperlipidemia  - EKG 12-Lead - Hepatic function panel - Lipid panel - TSH  4. Prediabetes  - EKG 12-Lead - Hemoglobin A1c - Insulin, random  5. Vitamin D deficiency  - VITAMIN D 25 Hydroxy   6. Rheumatoid arthritis involving multiple joints (HCC)   7. Hypothyroidism  - TSH  8. Screening for colorectal cancer  - POC Hemoccult Bld/Stl   9. Medication management  - Urinalysis, Routine w reflex microscopic - CBC with Differential/Platelet - BASIC METABOLIC PANEL WITH GFR - Hepatic function panel - Magnesium - Lipid panel - TSH - Hemoglobin A1c - Insulin, random - VITAMIN D 25 Hydroxy   10. Screening for ischemic heart disease  - EKG 12-Lead  11. Hereditary essential tremor  - Increase  diazepam (VALIUM) 5 MG tablet; Take 1/2 to 1 tablet 3 x/day for Hereditary Tremor  Dispense: 270 tablet; Refill: 1       Continue prudent diet as discussed, weight control, BP monitoring, regular exercise, and medications. Discussed med's effects and SE's. Screening labs and tests as requested with regular follow-up as recommended. Over 40 minutes of exam, counseling, chart review and high complex critical decision making was performed.

## 2016-12-29 LAB — URINALYSIS, ROUTINE W REFLEX MICROSCOPIC
BILIRUBIN URINE: NEGATIVE
GLUCOSE, UA: NEGATIVE
HGB URINE DIPSTICK: NEGATIVE
KETONES UR: NEGATIVE
Nitrite: NEGATIVE
PROTEIN: NEGATIVE
Specific Gravity, Urine: 1.012 (ref 1.001–1.035)
pH: 5.5 (ref 5.0–8.0)

## 2016-12-29 LAB — MICROALBUMIN / CREATININE URINE RATIO
CREATININE, URINE: 69 mg/dL (ref 20–320)
MICROALB UR: 0.6 mg/dL
MICROALB/CREAT RATIO: 9 ug/mg{creat} (ref <30)

## 2016-12-29 LAB — VITAMIN D 25 HYDROXY (VIT D DEFICIENCY, FRACTURES): VIT D 25 HYDROXY: 53 ng/mL (ref 30–100)

## 2016-12-29 LAB — URINALYSIS, MICROSCOPIC ONLY: Crystals: NONE SEEN [HPF]

## 2016-12-29 LAB — INSULIN, RANDOM: Insulin: 21.8 u[IU]/mL — ABNORMAL HIGH (ref 2.0–19.6)

## 2016-12-29 LAB — HEMOGLOBIN A1C
Hgb A1c MFr Bld: 5.1 % (ref <5.7)
Mean Plasma Glucose: 100 mg/dL

## 2016-12-29 LAB — MAGNESIUM: MAGNESIUM: 2 mg/dL (ref 1.5–2.5)

## 2016-12-31 LAB — URINE CULTURE

## 2017-01-20 DIAGNOSIS — L718 Other rosacea: Secondary | ICD-10-CM | POA: Diagnosis not present

## 2017-01-20 DIAGNOSIS — L570 Actinic keratosis: Secondary | ICD-10-CM | POA: Diagnosis not present

## 2017-01-31 ENCOUNTER — Other Ambulatory Visit: Payer: Self-pay | Admitting: *Deleted

## 2017-01-31 MED ORDER — ESTRADIOL 1 MG PO TABS: 1.0000 mg | ORAL_TABLET | Freq: Every day | ORAL | 3 refills | Status: DC

## 2017-01-31 MED ORDER — RANITIDINE HCL 300 MG PO TABS: 300.0000 mg | ORAL_TABLET | Freq: Every day | ORAL | 1 refills | Status: DC

## 2017-01-31 MED ORDER — LEVOTHYROXINE SODIUM 100 MCG PO TABS: 100.0000 ug | ORAL_TABLET | Freq: Every day | ORAL | 1 refills | Status: DC

## 2017-01-31 MED ORDER — PROPRANOLOL HCL 80 MG PO TABS: 80.0000 mg | ORAL_TABLET | Freq: Three times a day (TID) | ORAL | 1 refills | Status: DC

## 2017-02-01 DIAGNOSIS — M0579 Rheumatoid arthritis with rheumatoid factor of multiple sites without organ or systems involvement: Secondary | ICD-10-CM | POA: Diagnosis not present

## 2017-02-01 DIAGNOSIS — Z79899 Other long term (current) drug therapy: Secondary | ICD-10-CM | POA: Diagnosis not present

## 2017-03-07 ENCOUNTER — Other Ambulatory Visit: Payer: Self-pay | Admitting: *Deleted

## 2017-03-07 MED ORDER — MONTELUKAST SODIUM 10 MG PO TABS: 10.0000 mg | ORAL_TABLET | Freq: Every day | ORAL | 3 refills | Status: DC

## 2017-04-04 ENCOUNTER — Encounter: Payer: Self-pay | Admitting: Internal Medicine

## 2017-04-04 ENCOUNTER — Ambulatory Visit (INDEPENDENT_AMBULATORY_CARE_PROVIDER_SITE_OTHER): Payer: Medicare Other | Admitting: Internal Medicine

## 2017-04-04 ENCOUNTER — Ambulatory Visit: Payer: Self-pay | Admitting: Internal Medicine

## 2017-04-04 VITALS — BP 122/66 | HR 64 | Temp 97.6°F | Resp 16 | Ht 63.5 in | Wt 170.0 lb

## 2017-04-04 DIAGNOSIS — M069 Rheumatoid arthritis, unspecified: Secondary | ICD-10-CM | POA: Diagnosis not present

## 2017-04-04 DIAGNOSIS — I10 Essential (primary) hypertension: Secondary | ICD-10-CM

## 2017-04-04 DIAGNOSIS — E782 Mixed hyperlipidemia: Secondary | ICD-10-CM | POA: Diagnosis not present

## 2017-04-04 DIAGNOSIS — E039 Hypothyroidism, unspecified: Secondary | ICD-10-CM | POA: Diagnosis not present

## 2017-04-04 DIAGNOSIS — R7303 Prediabetes: Secondary | ICD-10-CM | POA: Diagnosis not present

## 2017-04-04 DIAGNOSIS — Z79899 Other long term (current) drug therapy: Secondary | ICD-10-CM | POA: Diagnosis not present

## 2017-04-04 DIAGNOSIS — E559 Vitamin D deficiency, unspecified: Secondary | ICD-10-CM | POA: Diagnosis not present

## 2017-04-04 LAB — HEPATIC FUNCTION PANEL
ALK PHOS: 61 U/L (ref 33–130)
ALT: 13 U/L (ref 6–29)
AST: 19 U/L (ref 10–35)
Albumin: 3.8 g/dL (ref 3.6–5.1)
BILIRUBIN INDIRECT: 0.4 mg/dL (ref 0.2–1.2)
BILIRUBIN TOTAL: 0.5 mg/dL (ref 0.2–1.2)
Bilirubin, Direct: 0.1 mg/dL (ref <=0.2)
Total Protein: 6.1 g/dL (ref 6.1–8.1)

## 2017-04-04 LAB — CBC WITH DIFFERENTIAL/PLATELET
BASOS ABS: 46 {cells}/uL (ref 0–200)
BASOS PCT: 1 %
EOS ABS: 92 {cells}/uL (ref 15–500)
Eosinophils Relative: 2 %
HCT: 36.9 % (ref 35.0–45.0)
HEMOGLOBIN: 12 g/dL (ref 11.7–15.5)
Lymphocytes Relative: 35 %
Lymphs Abs: 1610 cells/uL (ref 850–3900)
MCH: 31.6 pg (ref 27.0–33.0)
MCHC: 32.5 g/dL (ref 32.0–36.0)
MCV: 97.1 fL (ref 80.0–100.0)
MPV: 9.9 fL (ref 7.5–12.5)
Monocytes Absolute: 782 cells/uL (ref 200–950)
Monocytes Relative: 17 %
NEUTROS ABS: 2070 {cells}/uL (ref 1500–7800)
Neutrophils Relative %: 45 %
Platelets: 254 10*3/uL (ref 140–400)
RBC: 3.8 MIL/uL (ref 3.80–5.10)
RDW: 15.7 % — ABNORMAL HIGH (ref 11.0–15.0)
WBC: 4.6 10*3/uL (ref 3.8–10.8)

## 2017-04-04 LAB — LIPID PANEL
Cholesterol: 168 mg/dL (ref <200)
HDL: 68 mg/dL (ref >50)
LDL CALC: 75 mg/dL (ref <100)
TRIGLYCERIDES: 124 mg/dL (ref <150)
Total CHOL/HDL Ratio: 2.5 Ratio (ref <5.0)
VLDL: 25 mg/dL (ref <30)

## 2017-04-04 LAB — BASIC METABOLIC PANEL WITH GFR
BUN: 13 mg/dL (ref 7–25)
CHLORIDE: 104 mmol/L (ref 98–110)
CO2: 30 mmol/L (ref 20–31)
Calcium: 8.9 mg/dL (ref 8.6–10.4)
Creat: 0.77 mg/dL (ref 0.60–0.93)
GFR, EST AFRICAN AMERICAN: 86 mL/min (ref >=60)
GFR, Est Non African American: 74 mL/min (ref >=60)
Glucose, Bld: 80 mg/dL (ref 65–99)
POTASSIUM: 3.8 mmol/L (ref 3.5–5.3)
SODIUM: 142 mmol/L (ref 135–146)

## 2017-04-04 LAB — MAGNESIUM: Magnesium: 2 mg/dL (ref 1.5–2.5)

## 2017-04-04 LAB — TSH: TSH: 1.23 m[IU]/L

## 2017-04-04 NOTE — Progress Notes (Signed)
This very nice 76 y.o. [facility] presents for 3 month follow up with HTN, HLD, PreDM, Hypothyroidism and Vitamin D Deficiency. Patient also was dx'd w/Rh Arthritis in 2001 and is followed at the [facility]/Rheumatology clinics. Currently she is doing well on therapy with MTX & Azulfidine. She also has Hereditary Tremor and is on Propranolol and Diazepam.      Patient is treated for HTN (1998) & BP has been controlled at home. Today's BP is at goal - 122/66. Patient has had no complaints of any cardiac type chest pain, palpitations, dyspnea/orthopnea/PND, dizziness, claudication, or dependent edema.     Hyperlipidemia is controlled with diet & meds. Patient denies myalgias or other med SE's. Last Lipids were at goal: Lab Results  Component Value Date   CHOL 155 12/28/2016   HDL 61 12/28/2016   LDLCALC 70 12/28/2016   TRIG 122 12/28/2016   CHOLHDL 2.5 12/28/2016      Also, the patient has history of PreDiabetes (2012) and has had no symptoms of reactive hypoglycemia, diabetic polys, paresthesias or visual blurring.  Last A1c was at goal: Lab Results  Component Value Date   HGBA1C 5.1 12/28/2016      Patient has hx/o Hypothyroidism predating from 1994 and has been on Thyroid Replacement. Further, the patient also has history of Vitamin D Deficiency  Since 2009 with a level of "17" and supplements vitamin D without any suspected side-effects. Last vitamin D was improved:  Lab Results  Component Value Date   VD25OH 4 12/28/2016   Current Outpatient Prescriptions on File Prior to Visit  Medication Sig  . Cholecalciferol (VITAMIN D) 2000 UNITS CAPS Take by mouth. Take 4 capsules by mouth once daily   . Cyanocobalamin 500 MCG/0.1ML SOLN Place 1 mL under the tongue daily.   . diazepam (VALIUM) 5 MG tablet Take 1/2 to 1 tablet 3 x/day for Hereditary Tremor  . estradiol (ESTRACE) 1 MG tablet Take 1 tablet (1 mg total) by mouth daily.  . folic acid (FOLVITE) 371 MCG tablet Take 800 mcg by mouth daily.    Marland Kitchen levothyroxine (SYNTHROID, LEVOTHROID) 100 MCG tablet Take 1 tablet (100 mcg total) by mouth daily.  . montelukast (SINGULAIR) 10 MG tablet Take 1 tablet (10 mg total) by mouth daily.  . ondansetron (ZOFRAN ODT) 8 MG disintegrating tablet 8mg  ODT q4 hours prn nausea  . pravastatin (PRAVACHOL) 40 MG tablet Take 1 tablet (40 mg total) by mouth daily.  . propranolol (INDERAL) 80 MG tablet Take 1 tablet (80 mg total) by mouth 3 (three) times daily.  . ranitidine (ZANTAC) 300 MG tablet Take 1 tablet (300 mg total) by mouth at bedtime.  . sulfaSALAzine (AZULFIDINE) 500 MG tablet Take 500 mg by mouth.  . traMADol (ULTRAM) 50 MG tablet Take 1 tablet (50 mg total) by mouth 4 (four) times daily.  . furosemide (LASIX) 40 MG tablet Take 1 tablet 2 x/day for fluid retention / ankle swelling   No current facility-administered medications on file prior to visit.    Allergies  Allergen Reactions  . Codeine Nausea Only  . Mysoline [Primidone] Nausea Only and Other (See Comments)    Over-sedated   PMHx:   Past Medical History:  Diagnosis Date  . Anemia   . Cataract   . Environmental allergies   . Gastric ulcer   . Heartburn   . Hiatal hernia   . Hyperlipidemia   . Hypertension   . Hypothyroidism   . Migraines   .  Prediabetes   . Rash october 2012   all over abdomen and breasts  . Rheumatoid arthritis(714.0)   . Tremor, essential    right hand   Immunization History  Administered Date(s) Administered  . DT 02/05/2015  . Influenza Split 09/12/2013  . Influenza, High Dose Seasonal PF 10/28/2014, 08/18/2016  . Pneumococcal Conjugate-13 05/12/2015  . Pneumococcal-Unspecified 11/21/2006  . Td 11/22/2003  . Varicella 06/23/2011  . Zoster 06/23/2011   Past Surgical History:  Procedure Laterality Date  . ABDOMINAL HYSTERECTOMY    . CATARACT EXTRACTION, BILATERAL    . CHOLECYSTECTOMY  08/31/11   lap chole   . COLONOSCOPY    . TUBAL LIGATION     FHx:    Reviewed / unchanged  SHx:     Reviewed / unchanged  Systems Review:  Constitutional: Denies fever, chills, wt changes, headaches, insomnia, fatigue, night sweats, change in appetite. Eyes: Denies redness, blurred vision, diplopia, discharge, itchy, watery eyes.  ENT: Denies discharge, congestion, post nasal drip, epistaxis, sore throat, earache, hearing loss, dental pain, tinnitus, vertigo, sinus pain, snoring.  CV: Denies chest pain, palpitations, irregular heartbeat, syncope, dyspnea, diaphoresis, orthopnea, PND, claudication or edema. Respiratory: denies cough, dyspnea, DOE, pleurisy, hoarseness, laryngitis, wheezing.  Gastrointestinal: Denies dysphagia, odynophagia, heartburn, reflux, water brash, abdominal pain or cramps, nausea, vomiting, bloating, diarrhea, constipation, hematemesis, melena, hematochezia  or hemorrhoids. Genitourinary: Denies dysuria, frequency, urgency, nocturia, hesitancy, discharge, hematuria or flank pain. Musculoskeletal: Denies arthralgias, myalgias, stiffness, jt. swelling, pain, limping or strain/sprain.  Skin: Denies pruritus, rash, hives, warts, acne, eczema or change in skin lesion(s). Neuro: No weakness, tremor, incoordination, spasms, paresthesia or pain. Psychiatric: Denies confusion, memory loss or sensory loss. Endo: Denies change in weight, skin or hair change.  Heme/Lymph: No excessive bleeding, bruising or enlarged lymph nodes.  Physical Exam  BP 122/66   Pulse 64   Temp 97.6 F (36.4 C)   Resp 16   Ht 5' 3.5" (1.613 m)   Wt 170 lb (77.1 kg)   BMI 29.64 kg/m   Appears well nourished, well groomed  and in no distress.  Eyes: PERRLA, EOMs, conjunctiva no swelling or erythema. Sinuses: No frontal/maxillary tenderness ENT/Mouth: EAC's clear, TM's nl w/o erythema, bulging. Nares clear w/o erythema, swelling, exudates. Oropharynx clear without erythema or exudates. Oral hygiene is good. Tongue normal, non obstructing. Hearing intact.  Neck: Supple. Thyroid nl. Car 2+/2+  without bruits, nodes or JVD. Chest: Respirations nl with BS clear & equal w/o rales, rhonchi, wheezing or stridor.  Cor: Heart sounds normal w/ regular rate and rhythm without sig. murmurs, gallops, clicks or rubs. Peripheral pulses normal and equal  without edema.  Abdomen: Soft & bowel sounds normal. Non-tender w/o guarding, rebound, hernias, masses or organomegaly.  Lymphatics: Unremarkable.  Musculoskeletal: Full ROM all peripheral extremities, joint stability, 5/5 strength and normal gait.  Skin: Warm, dry without exposed rashes, lesions or ecchymosis apparent.  Neuro: Cranial nerves intact, reflexes equal bilaterally. Sensory-motor testing grossly intact. Tendon reflexes grossly intact. Mild action tremor of the hand & titubation of the head. Speech is choppy.  Pysch: Alert & oriented x 3.  Insight and judgement nl & appropriate. No ideations.  Assessment and Plan:  1. Essential hypertension  - Continue medication, monitor blood pressure at home.  - Continue DASH diet. Reminder to go to the ER if any CP,  SOB, nausea, dizziness, severe HA, changes vision/speech,  left arm numbness and tingling and jaw pain.  - CBC with Differential/Platelet - BASIC METABOLIC PANEL  WITH GFR - Magnesium - TSH  2. Hyperlipidemia, mixed  - Continue diet/meds, exercise,& lifestyle modifications.  - Continue monitor periodic cholesterol/liver & renal functions   - Hepatic function panel - Lipid panel - TSH  3. Prediabetes  - Continue diet, exercise, lifestyle modifications.  - Monitor appropriate labs.  - Hemoglobin A1c - Insulin, random  4. Vitamin D deficiency  - Continue supplementation.  - VITAMIN D 25 Hydroxy   5. Hypothyroidism  - TSH  6. Rheumatoid arthritis involving multiple joints (HCC)   7. Medication management  - CBC with Differential/Platelet - BASIC METABOLIC PANEL WITH GFR - Hepatic function panel - Magnesium - Lipid panel - TSH - Hemoglobin A1c -  Insulin, random - VITAMIN D 25 Hydroxy        Discussed  regular exercise, BP monitoring, weight control to achieve/maintain BMI less than 25 and discussed med and SE's. Recommended labs to assess and monitor clinical status with further disposition pending results of labs. Over 30 minutes of exam, counseling, chart review was performed.

## 2017-04-04 NOTE — Patient Instructions (Signed)

## 2017-04-05 LAB — HEMOGLOBIN A1C
Hgb A1c MFr Bld: 5.4 % (ref <5.7)
Mean Plasma Glucose: 108 mg/dL

## 2017-04-05 LAB — VITAMIN D 25 HYDROXY (VIT D DEFICIENCY, FRACTURES): VIT D 25 HYDROXY: 46 ng/mL (ref 30–100)

## 2017-04-05 LAB — INSULIN, RANDOM: INSULIN: 6.8 u[IU]/mL (ref 2.0–19.6)

## 2017-04-07 ENCOUNTER — Encounter: Payer: Self-pay | Admitting: Physician Assistant

## 2017-04-07 ENCOUNTER — Other Ambulatory Visit: Payer: Self-pay | Admitting: Internal Medicine

## 2017-04-07 ENCOUNTER — Ambulatory Visit (INDEPENDENT_AMBULATORY_CARE_PROVIDER_SITE_OTHER): Payer: Medicare Other | Admitting: Physician Assistant

## 2017-04-07 ENCOUNTER — Ambulatory Visit (HOSPITAL_COMMUNITY)
Admission: RE | Admit: 2017-04-07 | Discharge: 2017-04-07 | Disposition: A | Discharge status: Home / Self Care | Payer: Medicare Other | Source: Ambulatory Visit | Attending: Physician Assistant | Admitting: Physician Assistant

## 2017-04-07 VITALS — BP 132/70 | HR 73 | Temp 97.7°F | Resp 16 | Ht 63.5 in | Wt 170.8 lb

## 2017-04-07 DIAGNOSIS — M7989 Other specified soft tissue disorders: Secondary | ICD-10-CM

## 2017-04-07 DIAGNOSIS — E785 Hyperlipidemia, unspecified: Secondary | ICD-10-CM | POA: Insufficient documentation

## 2017-04-07 DIAGNOSIS — Z87891 Personal history of nicotine dependence: Secondary | ICD-10-CM | POA: Insufficient documentation

## 2017-04-07 DIAGNOSIS — M79605 Pain in left leg: Secondary | ICD-10-CM | POA: Diagnosis not present

## 2017-04-07 DIAGNOSIS — I10 Essential (primary) hypertension: Secondary | ICD-10-CM | POA: Diagnosis not present

## 2017-04-07 NOTE — Progress Notes (Signed)
Subjective:    Patient ID: Dana [name], female    DOB: 1941/01/04, 76 y.o.   MRN: [id number]  HPI 76 y.o. WF presents with left leg pain and swelling x yesterday. She has swelling, pain, and lateral and posterior leg/calf. Tender to palpation, no warmth, no redness.  She is on estrogen. She is high risk for DVT/PE due to family history in her dad and estrogen use, she denies DVT/PE history of deep venous thrombosis, history of pulmonary embolus, history of malignancy, long duration of automobile or plane travel, prolonged stay in bed, recent limb injury or fracture and recent surgery   Blood pressure 132/70, pulse 73, temperature 97.7 F (36.5 C), resp. rate 16, height 5' 3.5" (1.613 m), weight 170 lb 12.8 oz (77.5 kg), SpO2 99 %.  Medications Current Outpatient Prescriptions on File Prior to Visit  Medication Sig  . Cholecalciferol (VITAMIN D) 2000 UNITS CAPS Take by mouth. Take 4 capsules by mouth once daily   . Cyanocobalamin 500 MCG/0.1ML SOLN Place 1 mL under the tongue daily.   . diazepam (VALIUM) 5 MG tablet Take 1/2 to 1 tablet 3 x/day for Hereditary Tremor  . estradiol (ESTRACE) 1 MG tablet Take 1 tablet (1 mg total) by mouth daily.  . folic acid (FOLVITE) 323 MCG tablet Take 800 mcg by mouth daily.   . hydroxychloroquine (PLAQUENIL) 200 MG tablet TAKE 1 TABLET BY MOUTH  TWICE A DAY  . levothyroxine (SYNTHROID, LEVOTHROID) 100 MCG tablet Take 1 tablet (100 mcg total) by mouth daily.  . methotrexate (RHEUMATREX) 2.5 MG tablet TAKE 10 TABLETS ONE TIME WEEKLY  . montelukast (SINGULAIR) 10 MG tablet Take 1 tablet (10 mg total) by mouth daily.  . ondansetron (ZOFRAN ODT) 8 MG disintegrating tablet 8mg  ODT q4 hours prn nausea  . pravastatin (PRAVACHOL) 40 MG tablet Take 1 tablet (40 mg total) by mouth daily.  . propranolol (INDERAL) 80 MG tablet Take 1 tablet (80 mg total) by mouth 3 (three) times daily.  . ranitidine (ZANTAC) 300 MG tablet Take 1 tablet (300 mg total) by mouth at  bedtime.  . sulfaSALAzine (AZULFIDINE) 500 MG tablet Take 500 mg by mouth.  . traMADol (ULTRAM) 50 MG tablet Take 1 tablet (50 mg total) by mouth 4 (four) times daily.  . furosemide (LASIX) 40 MG tablet Take 1 tablet 2 x/day for fluid retention / ankle swelling   No current facility-administered medications on file prior to visit.     Problem list She has Rheumatoid arthritis ([place]); Tremor, essential; Hypothyroidism; Migraines; Hyperlipidemia; Anemia; Hypertension; Other abnormal glucose; Vitamin D deficiency; Medication management; Generalized OA; and Other long term (current) drug therapy on her problem list.   Review of Systems  Constitutional: Negative.  Negative for chills and fever.  HENT: Negative.   Respiratory: Negative.  Negative for chest tightness and shortness of breath.   Cardiovascular: Positive for leg swelling. Negative for chest pain and palpitations.  Gastrointestinal: Negative.   Genitourinary: Negative.   Musculoskeletal: Negative.   Skin: Negative.   Neurological: Negative.   Hematological: Negative.   Psychiatric/Behavioral: Negative.        Objective:   Physical Exam  Constitutional: She is oriented to person, place, and time. She appears well-developed and well-nourished.  HENT:  Head: Normocephalic and atraumatic.  Eyes: Conjunctivae are normal. Pupils are equal, round, and reactive to light.  Neck: Normal range of motion. Neck supple.  Cardiovascular: Normal rate, regular rhythm and normal heart sounds.   Pulmonary/Chest: Effort normal  and breath sounds normal.  Abdominal: Soft. Bowel sounds are normal.  Musculoskeletal: She exhibits edema and tenderness.  Left leg with edema, 41 cm versus 36 on right, + tenderness posterior calf and lateral left leg, no warmth, no redness, no hard cord, + varicose veins, + homen's sign  Neurological: She is alert and oriented to person, place, and time. No cranial nerve deficit.  Skin: No rash noted. No erythema. No  pallor.      Assessment & Plan:  1. Left leg swelling + homen's, larger left leg, pain, no CP/SOB on estrogen, high risk Will get [place], patient given starter pack of xarelto will start if [place] + Go to ER if any worsening pain, SOB, CP.  Will have someone drive here there If negative likely varicose vein/bursitis, given information about that Elevated leg, compression stockings - VAS [place] LOWER EXTREMITY VENOUS (DVT); Future

## 2017-04-07 NOTE — Patient Instructions (Addendum)
Start taking 1/2 of the estrogen for 1-2 weeks,  Then do 1/2 every other day for 1-2 weeks  And then stop Estrogen puts you at much greater risk for blood clots, we need to stop this medication  If the blood clot is negative it like a varicose vein or a bursitis  There is information below about what to do Elevate, wear compression stockings, can do heat on the leg  Rivaroxaban oral tablets What is this medicine? RIVAROXABAN (ri va ROX a ban) is an anticoagulant (blood thinner). It is used to treat blood clots in the lungs or in the veins. It is also used after knee or hip surgeries to prevent blood clots. It is also used to lower the chance of stroke in people with a medical condition called atrial fibrillation. This medicine may be used for other purposes; ask your health care provider or pharmacist if you have questions. COMMON BRAND NAME(S): Xarelto, Xarelto Starter Pack What should I tell my health care provider before I take this medicine? They need to know if you have any of these conditions: -bleeding disorders -bleeding in the brain -blood in your stools (black or tarry stools) or if you have blood in your vomit -history of stomach bleeding -kidney disease -liver disease -low blood counts, like low white cell, platelet, or red cell counts -recent or planned spinal or epidural procedure -take medicines that treat or prevent blood clots -an unusual or allergic reaction to rivaroxaban, other medicines, foods, dyes, or preservatives -pregnant or trying to get pregnant -breast-feeding How should I use this medicine? Take this medicine by mouth with a glass of water. Follow the directions on the prescription label. Take your medicine at regular intervals. Do not take it more often than directed. Do not stop taking except on your doctor's advice. Stopping this medicine may increase your risk of a blood clot. Be sure to refill your prescription before you run out of medicine. If you  are taking this medicine after hip or knee replacement surgery, take it with or without food. If you are taking this medicine for atrial fibrillation, take it with your evening meal. If you are taking this medicine to treat blood clots, take it with food at the same time each day. If you are unable to swallow your tablet, you may crush the tablet and mix it in applesauce. Then, immediately eat the applesauce. You should eat more food right after you eat the applesauce containing the crushed tablet. Talk to your pediatrician regarding the use of this medicine in children. Special care may be needed. Overdosage: If you think you have taken too much of this medicine contact a poison control center or emergency room at once. NOTE: This medicine is only for you. Do not share this medicine with others. What if I miss a dose? If you take your medicine once a day and miss a dose, take the missed dose as soon as you remember. If you take your medicine twice a day and miss a dose, take the missed dose immediately. In this instance, 2 tablets may be taken at the same time. The next day you should take 1 tablet twice a day as directed. What may interact with this medicine? Do not take this medicine with any of the following medications: -defibrotide This medicine may also interact with the following medications: -aspirin and aspirin-like medicines -certain antibiotics like erythromycin, azithromycin, and clarithromycin -certain medicines for fungal infections like ketoconazole and itraconazole -certain medicines for irregular heart  beat like amiodarone, quinidine, dronedarone -certain medicines for seizures like carbamazepine, phenytoin -certain medicines that treat or prevent blood clots like warfarin, enoxaparin, and dalteparin -conivaptan -diltiazem -felodipine -indinavir -lopinavir; ritonavir -NSAIDS, medicines for pain and inflammation, like ibuprofen or  naproxen -ranolazine -rifampin -ritonavir -SNRIs, medicines for depression, like desvenlafaxine, duloxetine, levomilnacipran, venlafaxine -SSRIs, medicines for depression, like citalopram, escitalopram, fluoxetine, fluvoxamine, paroxetine, sertraline -St. John's wort -verapamil This list may not describe all possible interactions. Give your health care provider a list of all the medicines, herbs, non-prescription drugs, or dietary supplements you use. Also tell them if you smoke, drink alcohol, or use illegal drugs. Some items may interact with your medicine. What should I watch for while using this medicine? Visit your doctor or health care professional for regular checks on your progress. Notify your doctor or health care professional and seek emergency treatment if you develop breathing problems; changes in vision; chest pain; severe, sudden headache; pain, swelling, warmth in the leg; trouble speaking; sudden numbness or weakness of the face, arm or leg. These can be signs that your condition has gotten worse. If you are going to have surgery or other procedure, tell your doctor that you are taking this medicine. What side effects may I notice from receiving this medicine? Side effects that you should report to your doctor or health care professional as soon as possible: -allergic reactions like skin rash, itching or hives, swelling of the face, lips, or tongue -back pain -redness, blistering, peeling or loosening of the skin, including inside the mouth -signs and symptoms of bleeding such as bloody or black, tarry stools; red or dark-brown urine; spitting up blood or brown material that looks like coffee grounds; red spots on the skin; unusual bruising or bleeding from the eye, gums, or nose Side effects that usually do not require medical attention (report to your doctor or health care professional if they continue or are bothersome): -dizziness -muscle pain This list may not describe all  possible side effects. Call your doctor for medical advice about side effects. You may report side effects to FDA at 1-800-FDA-1088. Where should I keep my medicine? Keep out of the reach of children. Store at room temperature between 15 and 30 degrees C (59 and 86 degrees F). Throw away any unused medicine after the expiration date. NOTE: This sheet is a summary. It may not cover all possible information. If you have questions about this medicine, talk to your doctor, pharmacist, or health care provider.  2018 Elsevier/Gold Standard (2016-07-27 16:29:33)   Varicose Veins Varicose veins are veins that have become enlarged and twisted. CAUSES This condition is the result of valves in the veins not working properly. Valves in the veins help return blood from the leg to the heart. When your calf muscles squeeze, the blood moves up your leg then the valves close and this continues until the blood gets back to your heart.  If these valves are damaged, blood flows backwards and backs up into the veins in the leg near the skin OR if your are sitting/standing for a long time without using your calf muscles the blood will back up into the veins in your legs. This causes the veins to become larger. People who are on their feet a lot, sit a lot without walking (like on a plane, at a desk, or in a car), who are pregnant, or who are overweight are more likely to develop varicose veins. SYMPTOMS   Bulging, twisted-appearing, bluish veins, most commonly found  on the legs.  Leg pain or a feeling of heaviness. These symptoms may be worse at the end of the day.  Leg swelling.  Skin color changes. DIAGNOSIS  Varicose veins can usually be diagnosed with an exam of your legs by your caregiver. He or she may recommend an ultrasound of your leg veins. TREATMENT  Most varicose veins can be treated at home.However, other treatments are available for people who have persistent symptoms or who want to treat the  cosmetic appearance of the varicose veins. But this is only cosmetic and they will return if not properly treated. These include:  Laser treatment of very small varicose veins.  Medicine that is shot (injected) into the vein. This medicine hardens the walls of the vein and closes off the vein. This treatment is called sclerotherapy. Afterwards, you may need to wear clothing or bandages that apply pressure.  Surgery. HOME CARE INSTRUCTIONS   Do not stand or sit in one position for long periods of time. Do not sit with your legs crossed. Rest with your legs raised during the day.  Your legs have to be higher than your heart so that gravity will force the valves to open, so please really elevate your legs.   Wear elastic stockings or support hose. Do not wear other tight, encircling garments around the legs, pelvis, or waist.  [employer]  has a wide variety of well priced compression stockings. [place], [place] [address] [address] ARE COPPER INFUSED COMPRESSION SOCKS AT [facility] OR [facility]  Walk as much as possible to increase blood flow.  Raise the foot of your bed at night with 2-inch blocks.  If you get a cut in the skin over the vein and the vein bleeds, lie down with your leg raised and press on it with a clean cloth until the bleeding stops. Then place a bandage (dressing) on the cut. See your caregiver if it continues to bleed or needs stitches. SEEK MEDICAL CARE IF:   The skin around your ankle starts to break down.  You have pain, redness, tenderness, or hard swelling developing in your leg over a vein.  You are uncomfortable due to leg pain. Document Released: 08/17/2005 Document Revised: 01/30/2012 Document Reviewed: 01/03/2011 [facility] Patient Information 2014 [place].   Phlebitis Phlebitis is soreness and puffiness (swelling) in a vein. Follow these instructions at home:  Only take medicine as told by your doctor.  Raise  (elevate) the affected limb on a pillow as told by your doctor.  Keep a warm pack on the affected vein as told by your doctor. Do not sleep with a heating pad.  Use special stockings or bandages around the area of the affected vein as told by your doctor. These will speed healing and keep the condition from coming back.  Talk to your doctor about all the medicines you take.  Get follow-up blood tests as told by your doctor.  If the phlebitis is in your legs:  Avoid standing or resting for long periods.  Keep your legs moving. Raise your legs when you sit or lie.  Do not smoke.  Follow-up with your doctor as told. Contact a doctor if:  You have strange bruises or bleeding.  Your puffiness or pain in the affected area is not getting better.  You are taking medicine to lessen puffiness (anti-inflammatory medicine), and you get belly pain.  You have a fever. Get help right away if:  The  phlebitis gets worse and you have more pain, puffiness (swelling), or redness.  You have trouble breathing or have chest pain. This information is not intended to replace advice given to you by your health care provider. Make sure you discuss any questions you have with your health care provider. Document Released: 10/26/2009 Document Revised: 04/14/2016 Document Reviewed: 07/15/2013 Elsevier Interactive Patient Education  2017 [employer].

## 2017-05-03 DIAGNOSIS — M0579 Rheumatoid arthritis with rheumatoid factor of multiple sites without organ or systems involvement: Secondary | ICD-10-CM | POA: Diagnosis not present

## 2017-05-03 DIAGNOSIS — Z79899 Other long term (current) drug therapy: Secondary | ICD-10-CM | POA: Diagnosis not present

## 2017-06-05 ENCOUNTER — Ambulatory Visit (INDEPENDENT_AMBULATORY_CARE_PROVIDER_SITE_OTHER): Payer: Medicare Other | Admitting: Internal Medicine

## 2017-06-05 ENCOUNTER — Ambulatory Visit: Payer: Self-pay | Admitting: Physician Assistant

## 2017-06-05 ENCOUNTER — Encounter: Payer: Self-pay | Admitting: Internal Medicine

## 2017-06-05 VITALS — BP 122/56 | HR 53 | Temp 97.5°F | Resp 16 | Ht 63.5 in | Wt 169.0 lb

## 2017-06-05 DIAGNOSIS — I8001 Phlebitis and thrombophlebitis of superficial vessels of right lower extremity: Secondary | ICD-10-CM | POA: Diagnosis not present

## 2017-06-05 MED ORDER — PREDNISONE 20 MG PO TABS: ORAL_TABLET | ORAL | 0 refills | Status: DC

## 2017-06-05 NOTE — Progress Notes (Signed)
Subjective:    Patient ID: Dana [name], female    DOB: February 19, 1941, 76 y.o.   MRN: [id number]  HPI  This very nice 76 yo MWF with HTN, HLD, PreDM, Hypothyroidism, Rh Arthritis and essential tremor presents with a 1 week hx of a soft circumscribed area of her distal Rt medial shin. Denies injury.   Outpatient Medications Prior to Visit  Medication Sig Dispense Refill  . Cholecalciferol (VITAMIN D) 2000 UNITS CAPS Take by mouth. Take 4 capsules by mouth once daily     . Cyanocobalamin 500 MCG/0.1ML SOLN Place 1 mL under the tongue daily.     . diazepam (VALIUM) 5 MG tablet Take 1/2 to 1 tablet 3 x/day for Hereditary Tremor 952 tablet 1  . folic acid (FOLVITE) 841 MCG tablet Take 800 mcg by mouth daily.     . hydroxychloroquine (PLAQUENIL) 200 MG tablet TAKE 1 TABLET BY MOUTH  TWICE A DAY    . levothyroxine (SYNTHROID, LEVOTHROID) 100 MCG tablet Take 1 tablet (100 mcg total) by mouth daily. 90 tablet 1  . methotrexate (RHEUMATREX) 2.5 MG tablet TAKE 10 TABLETS ONE TIME WEEKLY    . montelukast (SINGULAIR) 10 MG tablet Take 1 tablet (10 mg total) by mouth daily. 90 tablet 3  . ondansetron (ZOFRAN ODT) 8 MG disintegrating tablet 8mg  ODT q4 hours prn nausea 30 tablet 3  . pravastatin (PRAVACHOL) 40 MG tablet Take 1 tablet (40 mg total) by mouth daily. 90 tablet 3  . propranolol (INDERAL) 80 MG tablet Take 1 tablet (80 mg total) by mouth 3 (three) times daily. 270 tablet 1  . ranitidine (ZANTAC) 300 MG tablet Take 1 tablet (300 mg total) by mouth at bedtime. 90 tablet 1  . sulfaSALAzine (AZULFIDINE) 500 MG tablet Take 500 mg by mouth.    . traMADol (ULTRAM) 50 MG tablet Take 1 tablet (50 mg total) by mouth 4 (four) times daily. 360 tablet 1  . furosemide (LASIX) 40 MG tablet Take 1 tablet 2 x/day for fluid retention / ankle swelling 180 tablet 1  . estradiol (ESTRACE) 1 MG tablet Take 1 tablet (1 mg total) by mouth daily. 90 tablet 3   No facility-administered medications prior to visit.     Allergies  Allergen Reactions  . Codeine Nausea Only  . Mysoline [Primidone] Nausea Only and Other (See Comments)    Over-sedated   Past Medical History:  Diagnosis Date  . Anemia   . Cataract   . Environmental allergies   . Gastric ulcer   . Heartburn   . Hiatal hernia   . Hyperlipidemia   . Hypertension   . Hypothyroidism   . Migraines   . Prediabetes   . Rash october 2012   all over abdomen and breasts  . Rheumatoid arthritis(714.0)   . Tremor, essential    right hand   Past Surgical History:  Procedure Laterality Date  . ABDOMINAL HYSTERECTOMY    . CATARACT EXTRACTION, BILATERAL    . CHOLECYSTECTOMY  08/31/11   lap chole   . COLONOSCOPY    . TUBAL LIGATION     Review of Systems  10 point systems review negative except as above.    Objective:   Physical Exam  BP (!) 122/56   Pulse (!) 53   Temp (!) 97.5 F (36.4 C)   Resp 16   Ht 5' 3.5" (1.613 m)   Wt 169 lb (76.7 kg)   BMI 29.47 kg/m   In no acute distress.  HEENT - WNL. Neck - supple.  Chest - Clear equal BS. Cor - Nl HS. RRR w/o sig MGR. PP 1(+). No edema. MS- FROM w/o deformities.  Gait Nl. Neuro -  Nl w/o focal abnormalities.  Skin - there is a tender inflamed bulbous varicosity of the distal medial Rt shin. No calf tenderness to compression and Bevelyn Buckles' is negative.    Assessment & Plan:   1. Superficial phlebitis of leg, right  - predniSONE (DELTASONE) 20 MG tablet; 1 tab 3 x day for 3 days, then 1 tab 2 x day for 3 days, then 1 tab 1 x day for 5 days  Dispense: 20 tablet; Refill: 0  - advised may stop the prednisone as soon as tenderness resolves.

## 2017-06-06 ENCOUNTER — Other Ambulatory Visit: Payer: Self-pay | Admitting: Internal Medicine

## 2017-06-09 DIAGNOSIS — T370X5A Adverse effect of sulfonamides, initial encounter: Secondary | ICD-10-CM | POA: Diagnosis not present

## 2017-06-09 DIAGNOSIS — Z79899 Other long term (current) drug therapy: Secondary | ICD-10-CM | POA: Diagnosis not present

## 2017-06-09 DIAGNOSIS — Z1159 Encounter for screening for other viral diseases: Secondary | ICD-10-CM | POA: Diagnosis not present

## 2017-06-09 DIAGNOSIS — M059 Rheumatoid arthritis with rheumatoid factor, unspecified: Secondary | ICD-10-CM | POA: Diagnosis not present

## 2017-06-09 DIAGNOSIS — M1990 Unspecified osteoarthritis, unspecified site: Secondary | ICD-10-CM | POA: Diagnosis not present

## 2017-06-09 DIAGNOSIS — M154 Erosive (osteo)arthritis: Secondary | ICD-10-CM | POA: Diagnosis not present

## 2017-06-19 ENCOUNTER — Other Ambulatory Visit: Payer: Self-pay | Admitting: Internal Medicine

## 2017-07-06 ENCOUNTER — Ambulatory Visit (INDEPENDENT_AMBULATORY_CARE_PROVIDER_SITE_OTHER): Payer: Medicare Other | Admitting: Internal Medicine

## 2017-07-06 VITALS — BP 130/68 | HR 72 | Temp 97.5°F | Resp 16 | Ht 63.5 in | Wt 168.6 lb

## 2017-07-06 DIAGNOSIS — E559 Vitamin D deficiency, unspecified: Secondary | ICD-10-CM | POA: Diagnosis not present

## 2017-07-06 DIAGNOSIS — I8001 Phlebitis and thrombophlebitis of superficial vessels of right lower extremity: Secondary | ICD-10-CM

## 2017-07-06 DIAGNOSIS — M069 Rheumatoid arthritis, unspecified: Secondary | ICD-10-CM

## 2017-07-06 DIAGNOSIS — R7303 Prediabetes: Secondary | ICD-10-CM | POA: Diagnosis not present

## 2017-07-06 DIAGNOSIS — Z79899 Other long term (current) drug therapy: Secondary | ICD-10-CM

## 2017-07-06 DIAGNOSIS — E782 Mixed hyperlipidemia: Secondary | ICD-10-CM

## 2017-07-06 DIAGNOSIS — E039 Hypothyroidism, unspecified: Secondary | ICD-10-CM | POA: Diagnosis not present

## 2017-07-06 DIAGNOSIS — I10 Essential (primary) hypertension: Secondary | ICD-10-CM | POA: Diagnosis not present

## 2017-07-06 DIAGNOSIS — G250 Essential tremor: Secondary | ICD-10-CM | POA: Diagnosis not present

## 2017-07-06 MED ORDER — PREDNISONE 20 MG PO TABS: ORAL_TABLET | ORAL | 0 refills | Status: DC

## 2017-07-06 NOTE — Patient Instructions (Signed)

## 2017-07-06 NOTE — Progress Notes (Signed)
This very nice 76 y.o. WWF presents for 6 month follow up with HTN, HLD, PreDM, Hypothyroidism and Vitamin D Deficiency. Patient also was dx'd w/Rh Arthritis in 2001 and is followed at the [facility]/Rheumatology clinics. Currently she is doing well on therapy with MTX & Azulfidine. She also has Hereditary Tremor and is improved on on Propranolol and Diazepam.      Patient is treated for HTN since 1998 & BP has been controlled at home. Today's BP is at goal - 130/68. Patient has had no complaints of any cardiac type chest pain, palpitations, dyspnea/orthopnea/PND, dizziness, claudication, or dependent edema.     Hyperlipidemia is controlled with diet & meds. Patient denies myalgias or other med SE's. Last Lipids were at goal: Lab Results  Component Value Date   CHOL 168 04/04/2017   HDL 68 04/04/2017   LDLCALC 75 04/04/2017   TRIG 124 04/04/2017   CHOLHDL 2.5 04/04/2017       Also, the patient has history of PreDiabetes (2012) and has had no symptoms of reactive hypoglycemia, diabetic polys, paresthesias or visual blurring.  Last A1c was at goal: Lab Results  Component Value Date   HGBA1C 5.4 04/04/2017       Patient has hx/o Hypothyroidism & has been on Thyroid Replacement since 1994. Further, the patient also has history of Vitamin D Deficiency since 2009 with a level of "17" and supplements vitamin D without any suspected side-effects. Last vitamin D was improved:  Lab Results  Component Value Date   VD25OH 46 04/04/2017   Current Outpatient Prescriptions on File Prior to Visit  Medication Sig  . Cholecalciferol (VITAMIN D) 2000 UNITS CAPS Take by mouth. Take 4 capsules by mouth once daily   . Cyanocobalamin 500 MCG/0.1ML SOLN Place 1 mL under the tongue daily.   . folic acid (FOLVITE) 585 MCG tablet Take 800 mcg by mouth daily.   . hydroxychloroquine (PLAQUENIL) 200 MG tablet TAKE 1 TABLET BY MOUTH  TWICE A DAY  . levothyroxine (SYNTHROID, LEVOTHROID) 100 MCG tablet TAKE 1 TABLET  BY MOUTH  DAILY  . methotrexate (RHEUMATREX) 2.5 MG tablet TAKE 10 TABLETS ONE TIME WEEKLY  . montelukast (SINGULAIR) 10 MG tablet Take 1 tablet (10 mg total) by mouth daily.  . ondansetron (ZOFRAN ODT) 8 MG disintegrating tablet 8mg  ODT q4 hours prn nausea  . pravastatin (PRAVACHOL) 40 MG tablet Take 1 tablet (40 mg total) by mouth daily.  . propranolol (INDERAL) 80 MG tablet TAKE 1 TABLET BY MOUTH 3  TIMES DAILY  . ranitidine (ZANTAC) 300 MG tablet TAKE 1 TABLET BY MOUTH AT  BEDTIME  . traMADol (ULTRAM) 50 MG tablet Take 1 tablet (50 mg total) by mouth 4 (four) times daily.  . furosemide (LASIX) 40 MG tablet Take 1 tablet 2 x/day for fluid retention / ankle swelling   No current facility-administered medications on file prior to visit.    Allergies  Allergen Reactions  . Codeine Nausea Only  . Mysoline [Primidone] Nausea Only and Other (See Comments)    Over-sedated   PMHx:   Past Medical History:  Diagnosis Date  . Anemia   . Cataract   . Environmental allergies   . Gastric ulcer   . Heartburn   . Hiatal hernia   . Hyperlipidemia   . Hypertension   . Hypothyroidism   . Migraines   . Prediabetes   . Rash october 2012   all over abdomen and breasts  . Rheumatoid arthritis(714.0)   .  Tremor, essential    right hand   Immunization History  Administered Date(s) Administered  . DT 02/05/2015  . Influenza Split 09/12/2013  . Influenza, High Dose Seasonal PF 10/28/2014, 08/18/2016  . Pneumococcal Conjugate-13 05/12/2015  . Pneumococcal-Unspecified 11/21/2006  . Td 11/22/2003  . Varicella 06/23/2011  . Zoster 06/23/2011   Past Surgical History:  Procedure Laterality Date  . ABDOMINAL HYSTERECTOMY    . CATARACT EXTRACTION, BILATERAL    . CHOLECYSTECTOMY  08/31/11   lap chole   . COLONOSCOPY    . TUBAL LIGATION     FHx:    Reviewed / unchanged  SHx:    Reviewed / unchanged  Systems Review:  Constitutional: Denies fever, chills, wt changes, headaches, insomnia,  fatigue, night sweats, change in appetite. Eyes: Denies redness, blurred vision, diplopia, discharge, itchy, watery eyes.  ENT: Denies discharge, congestion, post nasal drip, epistaxis, sore throat, earache, hearing loss, dental pain, tinnitus, vertigo, sinus pain, snoring.  CV: Denies chest pain, palpitations, irregular heartbeat, syncope, dyspnea, diaphoresis, orthopnea, PND, claudication or edema. Respiratory: denies cough, dyspnea, DOE, pleurisy, hoarseness, laryngitis, wheezing.  Gastrointestinal: Denies dysphagia, odynophagia, heartburn, reflux, water brash, abdominal pain or cramps, nausea, vomiting, bloating, diarrhea, constipation, hematemesis, melena, hematochezia  or hemorrhoids. Genitourinary: Denies dysuria, frequency, urgency, nocturia, hesitancy, discharge, hematuria or flank pain. Musculoskeletal: Denies arthralgias, myalgias, stiffness, jt. swelling, pain, limping or strain/sprain.  Skin: Denies pruritus, rash, hives, warts, acne, eczema or change in skin lesion(s). Neuro: No weakness, tremor, incoordination, spasms, paresthesia or pain. Psychiatric: Denies confusion, memory loss or sensory loss. Endo: Denies change in weight, skin or hair change.  Heme/Lymph: No excessive bleeding, bruising or enlarged lymph nodes.  Physical Exam  BP 130/68   Pulse 72   Temp (!) 97.5 F (36.4 C)   Resp 16   Ht 5' 3.5" (1.613 m)   Wt 168 lb 9.6 oz (76.5 kg)   BMI 29.40 kg/m   Appears well nourished, well groomed  and in no distress.  Eyes: PERRLA, EOMs, conjunctiva no swelling or erythema. Sinuses: No frontal/maxillary tenderness ENT/Mouth: EAC's clear, TM's nl w/o erythema, bulging. Nares clear w/o erythema, swelling, exudates. Oropharynx clear without erythema or exudates. Oral hygiene is good. Tongue normal, non obstructing. Hearing intact.  Neck: Supple. Thyroid nl. Car 2+/2+ without bruits, nodes or JVD. Chest: Respirations nl with BS clear & equal w/o rales, rhonchi, wheezing or  stridor.  Cor: Heart sounds normal w/ regular rate and rhythm without sig. murmurs, gallops, clicks or rubs. Peripheral pulses normal and equal  without edema.  Abdomen: Soft & bowel sounds normal. Non-tender w/o guarding, rebound, hernias, masses or organomegaly.  Lymphatics: Unremarkable.  Musculoskeletal: Full ROM all peripheral extremities, joint stability, 5/5 strength and normal gait.  Skin: Warm, dry without exposed rashes, lesions or ecchymosis apparent.  Neuro: Cranial nerves intact, reflexes equal bilaterally. Sensory-motor testing grossly intact. Tendon reflexes grossly intact. Mild action tremor of the hand & titubation of the head. Speech is choppy.  Pysch: Alert & oriented x 3.  Insight and judgement nl & appropriate. No ideations.  Assessment and Plan:  1. Essential hypertension  - Continue medication, monitor blood pressure at home.  - Continue DASH diet. Reminder to go to the ER if any CP,  SOB, nausea, dizziness, severe HA, changes vision/speech,  left arm numbness and tingling and jaw pain.  - CBC with Differential/Platelet - BASIC METABOLIC PANEL WITH GFR - Magnesium - TSH  2. Hyperlipidemia, mixed  - Continue diet/meds, exercise,& lifestyle modifications.  -  Continue monitor periodic cholesterol/liver & renal functions   - Hepatic function panel - Lipid panel - TSH  3. Prediabetes  - Continue diet, exercise, lifestyle modifications.  - Monitor appropriate labs.  - Hemoglobin A1c - Insulin, random  4. Vitamin D deficiency  - Continue supplementation.  - VITAMIN D 25 Hydroxy   5. Hypothyroidism  - TSH  6. Rheumatoid arthritis involving multiple joints (HCC)   7. Medication management  - CBC with Differential/Platelet - BASIC METABOLIC PANEL WITH GFR - Hepatic function panel - Magnesium - Lipid panel - TSH - Hemoglobin A1c - Insulin, random - VITAMIN D 25 Hydroxy        Discussed  regular exercise, BP monitoring, weight control to  achieve/maintain BMI less than 25 and discussed med and SE's. Recommended labs to assess and monitor clinical status with further disposition pending results of labs. Over 30 minutes of exam, counseling, chart review was performed.

## 2017-07-07 LAB — HEMOGLOBIN A1C
HEMOGLOBIN A1C: 5.2 %{Hb} (ref <5.7)
MEAN PLASMA GLUCOSE: 103 (calc)
eAG (mmol/L): 5.7 (calc)

## 2017-07-07 LAB — CBC WITH DIFFERENTIAL/PLATELET
BASOS ABS: 32 {cells}/uL (ref 0–200)
Basophils Relative: 0.7 %
EOS ABS: 59 {cells}/uL (ref 15–500)
Eosinophils Relative: 1.3 %
HCT: 36.1 % (ref 35.0–45.0)
Hemoglobin: 11.9 g/dL (ref 11.7–15.5)
Lymphs Abs: 2012 cells/uL (ref 850–3900)
MCH: 31.6 pg (ref 27.0–33.0)
MCHC: 33 g/dL (ref 32.0–36.0)
MCV: 96 fL (ref 80.0–100.0)
MPV: 10 fL (ref 7.5–12.5)
Monocytes Relative: 10 %
NEUTROS PCT: 43.3 %
Neutro Abs: 1949 cells/uL (ref 1500–7800)
PLATELETS: 269 10*3/uL (ref 140–400)
RBC: 3.76 10*6/uL — AB (ref 3.80–5.10)
RDW: 14.8 % (ref 11.0–15.0)
TOTAL LYMPHOCYTE: 44.7 %
WBC: 4.5 10*3/uL (ref 3.8–10.8)
WBCMIX: 450 {cells}/uL (ref 200–950)

## 2017-07-07 LAB — VITAMIN D 25 HYDROXY (VIT D DEFICIENCY, FRACTURES): VIT D 25 HYDROXY: 49 ng/mL (ref 30–100)

## 2017-07-07 LAB — BASIC METABOLIC PANEL WITH GFR
BUN / CREAT RATIO: 13 (calc) (ref 6–22)
BUN: 12 mg/dL (ref 7–25)
CHLORIDE: 102 mmol/L (ref 98–110)
CO2: 30 mmol/L (ref 20–32)
Calcium: 9.2 mg/dL (ref 8.6–10.4)
Creat: 0.94 mg/dL — ABNORMAL HIGH (ref 0.60–0.93)
GFR, EST AFRICAN AMERICAN: 67 mL/min/{1.73_m2} (ref >=60)
GFR, Est Non African American: 58 mL/min/{1.73_m2} — ABNORMAL LOW (ref >=60)
Glucose, Bld: 86 mg/dL (ref 65–99)
Potassium: 4 mmol/L (ref 3.5–5.3)
SODIUM: 141 mmol/L (ref 135–146)

## 2017-07-07 LAB — HEPATIC FUNCTION PANEL
AG Ratio: 2 (calc) (ref 1.0–2.5)
ALKALINE PHOSPHATASE (APISO): 70 U/L (ref 33–130)
ALT: 15 U/L (ref 6–29)
AST: 23 U/L (ref 10–35)
Albumin: 4 g/dL (ref 3.6–5.1)
BILIRUBIN INDIRECT: 0.4 mg/dL (ref 0.2–1.2)
Bilirubin, Direct: 0.1 mg/dL (ref 0.0–0.2)
GLOBULIN: 2 g/dL (ref 1.9–3.7)
TOTAL PROTEIN: 6 g/dL — AB (ref 6.1–8.1)
Total Bilirubin: 0.5 mg/dL (ref 0.2–1.2)

## 2017-07-07 LAB — LIPID PANEL
CHOLESTEROL: 168 mg/dL (ref <200)
HDL: 72 mg/dL (ref >50)
LDL CHOLESTEROL (CALC): 74 mg/dL
Non-HDL Cholesterol (Calc): 96 mg/dL (calc) (ref <130)
Total CHOL/HDL Ratio: 2.3 (calc) (ref <5.0)
Triglycerides: 138 mg/dL (ref <150)

## 2017-07-07 LAB — INSULIN, FASTING: INSULIN: 14.1 u[IU]/mL (ref 2.0–19.6)

## 2017-07-07 LAB — MAGNESIUM: MAGNESIUM: 2.3 mg/dL (ref 1.5–2.5)

## 2017-07-07 LAB — TSH: TSH: 2.21 mIU/L (ref 0.40–4.50)

## 2017-07-08 ENCOUNTER — Encounter: Payer: Self-pay | Admitting: Internal Medicine

## 2017-07-18 DIAGNOSIS — Z961 Presence of intraocular lens: Secondary | ICD-10-CM | POA: Diagnosis not present

## 2017-07-18 DIAGNOSIS — Z79899 Other long term (current) drug therapy: Secondary | ICD-10-CM | POA: Diagnosis not present

## 2017-09-12 ENCOUNTER — Other Ambulatory Visit: Payer: Self-pay | Admitting: Physician Assistant

## 2017-09-19 DIAGNOSIS — L570 Actinic keratosis: Secondary | ICD-10-CM | POA: Diagnosis not present

## 2017-09-19 DIAGNOSIS — X32XXXD Exposure to sunlight, subsequent encounter: Secondary | ICD-10-CM | POA: Diagnosis not present

## 2017-09-19 DIAGNOSIS — C44311 Basal cell carcinoma of skin of nose: Secondary | ICD-10-CM | POA: Diagnosis not present

## 2017-10-06 DIAGNOSIS — M19071 Primary osteoarthritis, right ankle and foot: Secondary | ICD-10-CM | POA: Diagnosis not present

## 2017-10-06 DIAGNOSIS — M7752 Other enthesopathy of left foot: Secondary | ICD-10-CM | POA: Diagnosis not present

## 2017-10-06 DIAGNOSIS — M19041 Primary osteoarthritis, right hand: Secondary | ICD-10-CM | POA: Diagnosis not present

## 2017-10-06 DIAGNOSIS — Z79899 Other long term (current) drug therapy: Secondary | ICD-10-CM | POA: Diagnosis not present

## 2017-10-06 DIAGNOSIS — M7751 Other enthesopathy of right foot: Secondary | ICD-10-CM | POA: Diagnosis not present

## 2017-10-06 DIAGNOSIS — M180 Bilateral primary osteoarthritis of first carpometacarpal joints: Secondary | ICD-10-CM | POA: Diagnosis not present

## 2017-10-06 DIAGNOSIS — M19032 Primary osteoarthritis, left wrist: Secondary | ICD-10-CM | POA: Diagnosis not present

## 2017-10-06 DIAGNOSIS — M8580 Other specified disorders of bone density and structure, unspecified site: Secondary | ICD-10-CM | POA: Diagnosis not present

## 2017-10-06 DIAGNOSIS — M19031 Primary osteoarthritis, right wrist: Secondary | ICD-10-CM | POA: Diagnosis not present

## 2017-10-06 DIAGNOSIS — M0589 Other rheumatoid arthritis with rheumatoid factor of multiple sites: Secondary | ICD-10-CM | POA: Diagnosis not present

## 2017-10-06 DIAGNOSIS — M19042 Primary osteoarthritis, left hand: Secondary | ICD-10-CM | POA: Diagnosis not present

## 2017-10-06 DIAGNOSIS — M0579 Rheumatoid arthritis with rheumatoid factor of multiple sites without organ or systems involvement: Secondary | ICD-10-CM | POA: Diagnosis not present

## 2017-10-06 DIAGNOSIS — M150 Primary generalized (osteo)arthritis: Secondary | ICD-10-CM | POA: Diagnosis not present

## 2017-10-06 DIAGNOSIS — M19072 Primary osteoarthritis, left ankle and foot: Secondary | ICD-10-CM | POA: Diagnosis not present

## 2017-10-08 NOTE — Progress Notes (Signed)
MEDICARE ANNUAL WELLNESS VISIT AND FOLLOW UP  Assessment:    Diagnoses and all orders for this visit:  Other migraine without status migrainosus, not intractable Cannot recall last one she had; takes tylenol as needed.   Essential hypertension Continue medication Monitor blood pressure at home; call if consistently over 130/80 Continue DASH diet.   Reminder to go to the ER if any CP, SOB, nausea, dizziness, severe HA, changes vision/speech, left arm numbness and tingling and jaw pain.  Hypothyroidism, unspecified type continue medications the same reminded to take on an empty stomach 30-74mins before food.  check TSH level  Tremor, essential Stable; not significantly interfering with ADLs - has been evaluated by neurology - recommended she consider a glass of wine a day  Rheumatoid arthritis involving multiple sites, unspecified rheumatoid factor presence ([place]) On methotrexate and plaquanil; managed by [facility] rheumatology- continue follow up Will monitor closely for infection/CA  Generalized OA Continue f/u with rheumatology  Mixed hyperlipidemia Continue medications Continue low cholesterol diet and exercise.  Check lipid panel.    Other abnormal glucose Well managed by lifestyle changes Discussed diet/exercise, weight management  A1C  Vitamin D deficiency Continue supplementation and routine monitoring  Anemia, unspecified type Check CBC; continue supplementation of B12/folate  - unclear whether related to deficiency or chronic disease state   Stage 3 chronic kidney disease (HCC) Increase fluids, avoid NSAIDS, monitor sugars, will monitor   Over 40 minutes of exam, counseling, chart review and critical decision making was performed Future Appointments  Date Time Provider [place]  01/25/2018  2:00 PM Unk Pinto, MD [facility] None     Plan:   During the course of the visit the Dana was educated and counseled about appropriate screening  and preventive services including:    Pneumococcal vaccine   Prevnar 13  Influenza vaccine  Td vaccine  Screening electrocardiogram  Bone densitometry screening  Colorectal cancer screening  Diabetes screening  Glaucoma screening  Nutrition counseling   Advanced directives: requested   Subjective:  Dana [name] is a 76 y.o. female who presents for Medicare Annual Wellness Visit and 3 month follow up. has Rheumatoid arthritis ([place]); Tremor, essential; Hypothyroidism; Migraines; Hyperlipidemia; Anemia; Hypertension; Other abnormal glucose; Vitamin D deficiency; Medication management; Generalized OA; Other long term (current) drug therapy; and Stage 3 chronic kidney disease ([place]) on their problem [name]. She continues to follow up with rheumatology every 3 months. She reports her migraines have been doing well, cannot recall the last time she had one, but reports resolves quickly with tylenol.  BMI is Body mass index is 29.47 kg/m., she has been working on diet and exercise. Wt Readings from Last 3 Encounters:  10/09/17 169 lb (76.7 kg)  07/06/17 168 lb 9.6 oz (76.5 kg)  06/05/17 169 lb (76.7 kg)    Her blood pressure has been controlled at home, today their BP is BP: 126/64 She does not workout. She denies chest pain, shortness of breath, dizziness.   She is on cholesterol medication and denies myalgias. Her cholesterol is at goal. The cholesterol last visit was:   Lab Results  Component Value Date   CHOL 168 07/06/2017   HDL 72 07/06/2017   LDLCALC 75 04/04/2017   TRIG 138 07/06/2017   CHOLHDL 2.3 07/06/2017    She has been working on diet and exercise for history of elevated glucose, and denies increased appetite, nausea, paresthesia of the feet, polydipsia, polyuria, visual disturbances and vomiting. Last A1C in the office was:  Lab  Results  Component Value Date   HGBA1C 5.2 07/06/2017   She is on thyroid medication. Her medication was not changed last visit.    Lab Results  Component Value Date   TSH 2.21 07/06/2017   Last GFR: Lab Results  Component Value Date   GFRNONAA 58 (L) 07/06/2017   Dana is on Vitamin D supplement (10000) but remains below goal:  Lab Results  Component Value Date   VD25OH 49 07/06/2017      Medication Review: Current Outpatient Medications on File Prior to Visit  Medication Sig Dispense Refill  . Cholecalciferol (VITAMIN D) 2000 UNITS CAPS Take by mouth. Take 4 capsules by mouth once daily     . Cyanocobalamin 500 MCG/0.1ML SOLN Place 1 mL under the tongue daily.     . folic acid (FOLVITE) 630 MCG tablet Take 800 mcg by mouth daily.     . hydroxychloroquine (PLAQUENIL) 200 MG tablet TAKE 1 TABLET BY MOUTH  TWICE A DAY    . levothyroxine (SYNTHROID, LEVOTHROID) 100 MCG tablet TAKE 1 TABLET BY MOUTH  DAILY 90 tablet 1  . methotrexate (RHEUMATREX) 2.5 MG tablet TAKE 10 TABLETS ONE TIME WEEKLY    . montelukast (SINGULAIR) 10 MG tablet Take 1 tablet (10 mg total) by mouth daily. 90 tablet 3  . ondansetron (ZOFRAN ODT) 8 MG disintegrating tablet 8mg  ODT q4 hours prn nausea 30 tablet 3  . pravastatin (PRAVACHOL) 40 MG tablet Take 1 tablet (40 mg total) by mouth daily. 90 tablet 3  . predniSONE (DELTASONE) 20 MG tablet 1 tab 3 x day for 3 days, then 1 tab 2 x day for 3 days, then 1 tab 1 x day for 5 days 20 tablet 0  . propranolol (INDERAL) 80 MG tablet TAKE 1 TABLET BY MOUTH 3  TIMES DAILY 270 tablet 1  . ranitidine (ZANTAC) 300 MG tablet TAKE 1 TABLET BY MOUTH AT  BEDTIME 90 tablet 1  . traMADol (ULTRAM) 50 MG tablet Take 1 tablet (50 mg total) by mouth 4 (four) times daily. 360 tablet 1  . furosemide (LASIX) 40 MG tablet Take 1 tablet 2 x/day for fluid retention / ankle swelling 180 tablet 1   No current facility-administered medications on file prior to visit.     Allergies  Allergen Reactions  . Codeine Nausea Only  . Mysoline [Primidone] Nausea Only and Other (See Comments)    Over-sedated    Current  Problems (verified) Dana [name]   Diagnosis Date Noted  . Stage 3 chronic kidney disease ([place]) 10/08/2017  . Generalized OA 09/02/2015  . Other long term (current) drug therapy 09/02/2015  . Vitamin D deficiency 01/06/2014  . Medication management 01/06/2014  . Hypertension   . Other abnormal glucose   . Rheumatoid arthritis ([place])   . Tremor, essential   . Hypothyroidism   . Migraines   . Hyperlipidemia   . Anemia     Screening Tests Immunization History  Administered Date(s) Administered  . DT 02/05/2015  . Influenza Split 09/12/2013  . Influenza, High Dose Seasonal PF 10/28/2014, 08/18/2016  . Pneumococcal Conjugate-13 05/12/2015  . Pneumococcal-Unspecified 11/21/2006  . Td 11/22/2003  . Varicella 06/23/2011  . Zoster 06/23/2011   Preventative care: Last colonoscopy: 2013 Last mammogram: 2015 - She does want one done this year- information provided for Dana to schedule Last pap smear/pelvic exam: Remote; s/p hysterectomy   DEXA:  2014  Prior vaccinations: TD or Tdap: 2016  Influenza: 2018  Pneumococcal: 2008  Prevnar13: 2016 Shingles/Zostavax: 2012  Names of Other Physician/Practitioners you currently use: 1.  [facility]- here for primary care 2. Dr. Ellie Lunch, eye doctor, last visit  Aug 2018 3. Dr. Ninfa Linden, dentist, last visit 2018 -   Dana Care Team: Unk Pinto, MD as PCP - General (Internal Medicine) Roetta Sessions, MD as Referring Physician (Internal Medicine)  SURGICAL HISTORY She  has a past surgical history that includes Abdominal hysterectomy; Tubal ligation; Cataract extraction, bilateral; Cholecystectomy (08/31/11); and Colonoscopy. FAMILY HISTORY Her family history includes Diabetes in her mother and sister; Heart attack (age of onset: 71) in her son; Heart disease in her father, maternal aunt, maternal uncle, and mother. SOCIAL HISTORY She  reports that she quit smoking about 33  years ago. Her smoking use included cigarettes. she has never used smokeless tobacco. She reports that she does not drink alcohol or use drugs.   MEDICARE WELLNESS OBJECTIVES: Physical activity: Current Exercise Habits: The Dana does not participate in regular exercise at present, Exercise limited by: orthopedic condition(s)(Some knee pain) Cardiac risk factors: Cardiac Risk Factors include: advanced age (>61men, >73 women);dyslipidemia;family history of premature cardiovascular disease;sedentary lifestyle;smoking/ tobacco exposure Depression/mood screen:   Depression screen [facility] 2/9 10/09/2017  Decreased Interest 0  Down, Depressed, Hopeless 1  PHQ - 2 Score 1    ADLs:  In your present state of health, do you have any difficulty performing the following activities: 10/09/2017 07/08/2017  Hearing? N N  Vision? N N  Difficulty concentrating or making decisions? N N  Walking or climbing stairs? N N  Dressing or bathing? N N  Doing errands, shopping? N N  Some recent data might be hidden     Cognitive Testing  Alert? Yes  Normal Appearance?Yes  Oriented to person? Yes  Place? Yes   Time? Yes  Recall of three objects?  Yes  Can perform simple calculations? Yes  Displays appropriate judgment?Yes  Can read the correct time from a watch face?Yes  EOL planning: Does Dana Have a Medical Advance Directive?: No Would Dana like information on creating a medical advance directive?: Yes (MAU/Ambulatory/Procedural Areas - Information given)  Review of Systems  Constitutional: Negative for malaise/fatigue and weight loss.  HENT: Negative for hearing loss and tinnitus.   Eyes: Negative for blurred vision and double vision.  Respiratory: Negative for cough, shortness of breath and wheezing.   Cardiovascular: Negative for chest pain, palpitations, orthopnea, claudication and leg swelling.  Gastrointestinal: Negative for abdominal pain, blood in stool, constipation, diarrhea, heartburn,  melena, nausea and vomiting.  Genitourinary: Negative.   Musculoskeletal: Positive for joint pain (Bilateral knee pain). Negative for myalgias.  Skin: Negative for rash.  Neurological: Positive for tremors (Essential tremor worse on R upper extremity, vocal tremor). Negative for dizziness, tingling, sensory change, weakness and headaches.  Endo/Heme/Allergies: Negative for polydipsia.  Psychiatric/Behavioral: Negative.  Negative for depression, memory loss and substance abuse. The Dana is not nervous/anxious and does not have insomnia.   All other systems reviewed and are negative.    Objective:     Today's Vitals   10/09/17 1537  BP: 126/64  Pulse: (!) 58  Temp: (!) 97.3 F (36.3 C)  SpO2: 98%  Weight: 169 lb (76.7 kg)  Height: 5' 3.5" (1.613 m)   Body mass index is 29.47 kg/m.  General appearance: alert, no distress, WD/WN, female HEENT: normocephalic, sclerae anicteric, TMs pearly, nares patent, no discharge or erythema, pharynx normal Oral cavity: MMM, no lesions Neck: supple, no lymphadenopathy, no  thyromegaly, no masses Heart: RRR, normal S1, S2, no murmurs Lungs: CTA bilaterally, no wheezes, rhonchi, or rales Abdomen: +bs, soft, non tender, non distended, no masses, no hepatomegaly, no splenomegaly Musculoskeletal: nontender, no swelling, no obvious deformity Extremities: no edema, no cyanosis, no clubbing Pulses: 2+ symmetric, upper and lower extremities, normal cap refill Neurological: alert, oriented x 3, CN2-12 intact, strength normal upper extremities and lower extremities, sensation normal throughout, DTRs 2+ throughout, no cerebellar signs, gait normal. Dana demonstrates vocal tremor and minor RUE resting tremor.  Psychiatric: normal affect, behavior normal, pleasant   Medicare Attestation I have personally reviewed: The Dana's medical and social history Their use of alcohol, tobacco or illicit drugs Their current medications and supplements The  Dana's functional ability including ADLs,fall risks, home safety risks, cognitive, and hearing and visual impairment Diet and physical activities Evidence for depression or mood disorders  The Dana's weight, height, BMI, and visual acuity have been recorded in the chart.  I have made referrals, counseling, and provided education to the Dana based on review of the above and I have provided the Dana with a written personalized care plan for preventive services.     Izora Ribas, NP   10/09/2017

## 2017-10-09 ENCOUNTER — Encounter: Payer: Self-pay | Admitting: Adult Health

## 2017-10-09 ENCOUNTER — Ambulatory Visit (INDEPENDENT_AMBULATORY_CARE_PROVIDER_SITE_OTHER): Payer: Medicare Other | Admitting: Adult Health

## 2017-10-09 VITALS — BP 126/64 | HR 58 | Temp 97.3°F | Ht 63.5 in | Wt 169.0 lb

## 2017-10-09 DIAGNOSIS — I10 Essential (primary) hypertension: Secondary | ICD-10-CM

## 2017-10-09 DIAGNOSIS — G250 Essential tremor: Secondary | ICD-10-CM | POA: Diagnosis not present

## 2017-10-09 DIAGNOSIS — N183 Chronic kidney disease, stage 3 unspecified: Secondary | ICD-10-CM

## 2017-10-09 DIAGNOSIS — M069 Rheumatoid arthritis, unspecified: Secondary | ICD-10-CM

## 2017-10-09 DIAGNOSIS — R6889 Other general symptoms and signs: Secondary | ICD-10-CM

## 2017-10-09 DIAGNOSIS — E559 Vitamin D deficiency, unspecified: Secondary | ICD-10-CM

## 2017-10-09 DIAGNOSIS — D649 Anemia, unspecified: Secondary | ICD-10-CM | POA: Diagnosis not present

## 2017-10-09 DIAGNOSIS — E039 Hypothyroidism, unspecified: Secondary | ICD-10-CM | POA: Diagnosis not present

## 2017-10-09 DIAGNOSIS — M159 Polyosteoarthritis, unspecified: Secondary | ICD-10-CM | POA: Diagnosis not present

## 2017-10-09 DIAGNOSIS — Z79899 Other long term (current) drug therapy: Secondary | ICD-10-CM

## 2017-10-09 DIAGNOSIS — G43809 Other migraine, not intractable, without status migrainosus: Secondary | ICD-10-CM | POA: Diagnosis not present

## 2017-10-09 DIAGNOSIS — E782 Mixed hyperlipidemia: Secondary | ICD-10-CM

## 2017-10-09 DIAGNOSIS — R7309 Other abnormal glucose: Secondary | ICD-10-CM

## 2017-10-09 DIAGNOSIS — Z0000 Encounter for general adult medical examination without abnormal findings: Secondary | ICD-10-CM

## 2017-10-09 DIAGNOSIS — Z0001 Encounter for general adult medical examination with abnormal findings: Secondary | ICD-10-CM | POA: Diagnosis not present

## 2017-10-09 NOTE — Patient Instructions (Signed)
[facility]  7 a.m.-6:30 p.m., Monday 7 a.m.-5 p.m., Tuesday-Friday Schedule an appointment by calling [phone].  [facility] Schedule an appointment by calling [phone].  You can call to schedule with either of these imaging centers; they will ask you questions such as "are you having pain?" - answer NO to all of these questions so they perform a screening mammogram rather than diagnostic (which you would have to pay a copay/deductible for).     Essential Tremor A tremor is trembling or shaking that you cannot control. Most tremors affect the hands or arms. Tremors can also affect the head, vocal cords, face, and other parts of the body. Essential tremor is a tremor without a known cause. What are the causes? Essential tremor has no known cause. What increases the risk? You may be at greater risk of essential tremor if:  You have a family member with essential tremor.  You are age 78 or older.  You take certain medicines.  What are the signs or symptoms? The main sign of a tremor is uncontrolled and unintentional rhythmic shaking of a body part.  You may have difficulty eating with a spoon or fork.  You may have difficulty writing.  You may nod your head up and down or side to side.  You may have a quivering voice.  Your tremors:  May get worse over time.  May come and go.  May be more noticeable on one side of your body.  May get worse due to stress, fatigue, caffeine, and extreme heat or cold.  How is this diagnosed? Your health care provider can diagnose essential tremor based on your symptoms, medical history, and a physical examination. There is no single test to diagnose an essential tremor. However, your health care provider may perform a variety of tests to rule out other conditions. Tests may include:  Blood and urine tests.  Imaging studies of your brain, such as: ? CT scan. ? MRI.  A test that measures  involuntary muscle movement (electromyogram).  How is this treated? Your tremors may go away without treatment. Mild tremors may not need treatment if they do not affect your day-to-day life. Severe tremors may need to be treated using one or a combination of the following options:  Medicines. This may include medicine that is injected.  Lifestyle changes.  Physical therapy.  Follow these instructions at home:  Take medicines only as directed by your health care provider.  Limit alcohol intake to no more than 1 drink per day for nonpregnant women and 2 drinks per day for men. One drink equals 12 oz of beer, 5 oz of wine, or 1 oz of hard liquor.  Do not use any tobacco products, including cigarettes, chewing tobacco, or electronic cigarettes. If you need help quitting, ask your health care provider.  Take medicines only as directed by your health care provider.  Avoid extreme heat or cold.  Limit the amount of caffeine you consumeas directed by your health care provider.  Try to get eight hours of sleep each night.  Find ways to manage your stress, such as meditation or yoga.  Keep all follow-up visits as directed by your health care provider. This is important. This includes any physical therapy visits. Contact a health care provider if:  You experience any changes in the location or intensity of your tremors.  You start having a tremor after starting a new medicine.  You have tremor with other symptoms such  as: ? Numbness. ? Tingling. ? Pain. ? Weakness.  Your tremor gets worse.  Your tremor interferes with your daily life. This information is not intended to replace advice given to you by your health care provider. Make sure you discuss any questions you have with your health care provider. Document Released: 11/28/2014 Document Revised: 04/14/2016 Document Reviewed: 05/05/2014 Elsevier Interactive Patient Education  [employer].

## 2017-10-10 LAB — HEPATIC FUNCTION PANEL
AG Ratio: 1.9 (calc) (ref 1.0–2.5)
ALBUMIN MSPROF: 3.9 g/dL (ref 3.6–5.1)
ALT: 13 U/L (ref 6–29)
AST: 20 U/L (ref 10–35)
Alkaline phosphatase (APISO): 63 U/L (ref 33–130)
BILIRUBIN DIRECT: 0.1 mg/dL (ref 0.0–0.2)
BILIRUBIN TOTAL: 0.4 mg/dL (ref 0.2–1.2)
GLOBULIN: 2.1 g/dL (ref 1.9–3.7)
Indirect Bilirubin: 0.3 mg/dL (calc) (ref 0.2–1.2)
Total Protein: 6 g/dL — ABNORMAL LOW (ref 6.1–8.1)

## 2017-10-10 LAB — CBC WITH DIFFERENTIAL/PLATELET
BASOS ABS: 41 {cells}/uL (ref 0–200)
Basophils Relative: 0.9 %
EOS PCT: 2.4 %
Eosinophils Absolute: 110 cells/uL (ref 15–500)
HCT: 32.5 % — ABNORMAL LOW (ref 35.0–45.0)
HEMOGLOBIN: 10.9 g/dL — AB (ref 11.7–15.5)
LYMPHS ABS: 1730 {cells}/uL (ref 850–3900)
MCH: 31.8 pg (ref 27.0–33.0)
MCHC: 33.5 g/dL (ref 32.0–36.0)
MCV: 94.8 fL (ref 80.0–100.0)
MPV: 9.9 fL (ref 7.5–12.5)
Monocytes Relative: 13.8 %
NEUTROS ABS: 2084 {cells}/uL (ref 1500–7800)
Neutrophils Relative %: 45.3 %
PLATELETS: 226 10*3/uL (ref 140–400)
RBC: 3.43 10*6/uL — AB (ref 3.80–5.10)
RDW: 14.7 % (ref 11.0–15.0)
Total Lymphocyte: 37.6 %
WBC: 4.6 10*3/uL (ref 3.8–10.8)
WBCMIX: 635 {cells}/uL (ref 200–950)

## 2017-10-10 LAB — BASIC METABOLIC PANEL WITH GFR
BUN: 12 mg/dL (ref 7–25)
CALCIUM: 9.1 mg/dL (ref 8.6–10.4)
CO2: 33 mmol/L — AB (ref 20–32)
Chloride: 105 mmol/L (ref 98–110)
Creat: 0.75 mg/dL (ref 0.60–0.93)
GFR, EST AFRICAN AMERICAN: 88 mL/min/{1.73_m2} (ref >=60)
GFR, EST NON AFRICAN AMERICAN: 76 mL/min/{1.73_m2} (ref >=60)
Glucose, Bld: 93 mg/dL (ref 65–99)
POTASSIUM: 4 mmol/L (ref 3.5–5.3)
Sodium: 145 mmol/L (ref 135–146)

## 2017-10-10 LAB — MAGNESIUM: Magnesium: 2.3 mg/dL (ref 1.5–2.5)

## 2017-10-10 LAB — LIPID PANEL
CHOL/HDL RATIO: 2.3 (calc) (ref <5.0)
CHOLESTEROL: 154 mg/dL (ref <200)
HDL: 66 mg/dL (ref >50)
LDL Cholesterol (Calc): 70 mg/dL (calc)
NON-HDL CHOLESTEROL (CALC): 88 mg/dL (ref <130)
Triglycerides: 98 mg/dL (ref <150)

## 2017-10-10 LAB — HEMOGLOBIN A1C
EAG (MMOL/L): 5.7 (calc)
Hgb A1c MFr Bld: 5.2 % of total Hgb (ref <5.7)
MEAN PLASMA GLUCOSE: 103 (calc)

## 2017-10-10 LAB — IRON, TOTAL/TOTAL IRON BINDING CAP
%SAT: 10 % (calc) — ABNORMAL LOW (ref 11–50)
Iron: 29 ug/dL — ABNORMAL LOW (ref 45–160)
TIBC: 303 ug/dL (ref 250–450)

## 2017-10-10 LAB — TEST AUTHORIZATION

## 2017-10-10 LAB — VITAMIN D 25 HYDROXY (VIT D DEFICIENCY, FRACTURES): VIT D 25 HYDROXY: 49 ng/mL (ref 30–100)

## 2017-10-10 LAB — FERRITIN: Ferritin: 48 ng/mL (ref 20–288)

## 2017-10-10 LAB — TSH: TSH: 0.97 m[IU]/L (ref 0.40–4.50)

## 2017-10-16 ENCOUNTER — Other Ambulatory Visit: Payer: Self-pay | Admitting: Physician Assistant

## 2017-11-03 ENCOUNTER — Other Ambulatory Visit: Payer: Self-pay | Admitting: Internal Medicine

## 2017-11-03 DIAGNOSIS — R609 Edema, unspecified: Secondary | ICD-10-CM

## 2017-12-04 ENCOUNTER — Other Ambulatory Visit: Payer: Self-pay | Admitting: Physician Assistant

## 2018-01-13 ENCOUNTER — Other Ambulatory Visit: Payer: Self-pay | Admitting: Internal Medicine

## 2018-01-15 ENCOUNTER — Other Ambulatory Visit: Payer: Self-pay | Admitting: *Deleted

## 2018-01-15 DIAGNOSIS — R609 Edema, unspecified: Secondary | ICD-10-CM

## 2018-01-15 MED ORDER — PRAVASTATIN SODIUM 40 MG PO TABS: 40.0000 mg | ORAL_TABLET | Freq: Every day | ORAL | 3 refills | Status: DC

## 2018-01-15 MED ORDER — FUROSEMIDE 40 MG PO TABS: ORAL_TABLET | ORAL | 1 refills | Status: DC

## 2018-01-17 DIAGNOSIS — M0579 Rheumatoid arthritis with rheumatoid factor of multiple sites without organ or systems involvement: Secondary | ICD-10-CM | POA: Diagnosis not present

## 2018-01-17 DIAGNOSIS — Z79899 Other long term (current) drug therapy: Secondary | ICD-10-CM | POA: Diagnosis not present

## 2018-01-23 DIAGNOSIS — L570 Actinic keratosis: Secondary | ICD-10-CM | POA: Diagnosis not present

## 2018-01-23 DIAGNOSIS — L400 Psoriasis vulgaris: Secondary | ICD-10-CM | POA: Diagnosis not present

## 2018-01-23 DIAGNOSIS — Z85828 Personal history of other malignant neoplasm of skin: Secondary | ICD-10-CM | POA: Diagnosis not present

## 2018-01-23 DIAGNOSIS — X32XXXD Exposure to sunlight, subsequent encounter: Secondary | ICD-10-CM | POA: Diagnosis not present

## 2018-01-23 DIAGNOSIS — Z08 Encounter for follow-up examination after completed treatment for malignant neoplasm: Secondary | ICD-10-CM | POA: Diagnosis not present

## 2018-01-25 ENCOUNTER — Ambulatory Visit (INDEPENDENT_AMBULATORY_CARE_PROVIDER_SITE_OTHER): Payer: Medicare Other | Admitting: Internal Medicine

## 2018-01-25 VITALS — BP 130/66 | HR 56 | Temp 97.0°F | Resp 16 | Ht 63.0 in | Wt 168.2 lb

## 2018-01-25 DIAGNOSIS — E039 Hypothyroidism, unspecified: Secondary | ICD-10-CM | POA: Diagnosis not present

## 2018-01-25 DIAGNOSIS — E559 Vitamin D deficiency, unspecified: Secondary | ICD-10-CM | POA: Diagnosis not present

## 2018-01-25 DIAGNOSIS — Z136 Encounter for screening for cardiovascular disorders: Secondary | ICD-10-CM

## 2018-01-25 DIAGNOSIS — N183 Chronic kidney disease, stage 3 unspecified: Secondary | ICD-10-CM

## 2018-01-25 DIAGNOSIS — R7309 Other abnormal glucose: Secondary | ICD-10-CM

## 2018-01-25 DIAGNOSIS — Z0001 Encounter for general adult medical examination with abnormal findings: Secondary | ICD-10-CM

## 2018-01-25 DIAGNOSIS — Z8249 Family history of ischemic heart disease and other diseases of the circulatory system: Secondary | ICD-10-CM

## 2018-01-25 DIAGNOSIS — E782 Mixed hyperlipidemia: Secondary | ICD-10-CM

## 2018-01-25 DIAGNOSIS — Z1212 Encounter for screening for malignant neoplasm of rectum: Secondary | ICD-10-CM

## 2018-01-25 DIAGNOSIS — G250 Essential tremor: Secondary | ICD-10-CM

## 2018-01-25 DIAGNOSIS — Z0000 Encounter for general adult medical examination without abnormal findings: Secondary | ICD-10-CM

## 2018-01-25 DIAGNOSIS — Z87891 Personal history of nicotine dependence: Secondary | ICD-10-CM

## 2018-01-25 DIAGNOSIS — Z1211 Encounter for screening for malignant neoplasm of colon: Secondary | ICD-10-CM

## 2018-01-25 DIAGNOSIS — R7303 Prediabetes: Secondary | ICD-10-CM

## 2018-01-25 DIAGNOSIS — I10 Essential (primary) hypertension: Secondary | ICD-10-CM | POA: Diagnosis not present

## 2018-01-25 DIAGNOSIS — Z79899 Other long term (current) drug therapy: Secondary | ICD-10-CM

## 2018-01-25 DIAGNOSIS — M069 Rheumatoid arthritis, unspecified: Secondary | ICD-10-CM

## 2018-01-25 NOTE — Patient Instructions (Signed)

## 2018-01-25 NOTE — Progress Notes (Signed)
[facility] Unk Pinto, M.D.     Uvaldo Bristle. Silverio Lay, P.A.-C Liane Comber, [place] [address] [place], [place] [address] Telephone [phone] Telefax [phone] Annual Screening/Preventative Visit & Comprehensive Evaluation &  Examination     This very nice 77 y.o. [facility] presents for a Screening/Preventative Visit & comprehensive evaluation and management of multiple medical co-morbidities.  Patient has been followed for HTN, HLD, Hypothyroidism,  Prediabetes  and Vitamin D Deficiency.     In 2001, she was dx'd w/Rheumatoid Arthritis and is stable on Azulfadine & MTX followed at the [facility] in [place]. She also has Hereditary tremor stable on Propranolol & Diazepam.      HTN predates circa 1998. Patient's BP has been controlled at home and patient denies any cardiac symptoms as chest pain, palpitations, shortness of breath, dizziness or ankle swelling. Today's BP is at goal - 130/66      Patient's hyperlipidemia is controlled with diet and medications. Patient denies myalgias or other medication SE's. Last lipids were  Lab Results  Component Value Date   CHOL 154 10/09/2017   HDL 66 10/09/2017   LDLCALC 75 04/04/2017   TRIG 98 10/09/2017   CHOLHDL 2.3 10/09/2017      Patient has prediabetes (2012) and patient denies reactive hypoglycemic symptoms, visual blurring, diabetic polys, or paresthesias. Last A1c was Normal & at goal: Lab Results  Component Value Date   HGBA1C 5.2 10/09/2017      Patient was initiated on Thyroid replacement in 1994. Finally, patient has history of Vitamin D Deficiency ("17"/2009)  and last Vitamin D was improved. Lab Results  Component Value Date   VD25OH 49 10/09/2017   Current Outpatient Medications on File Prior to Visit  Medication Sig  . Cholecalciferol (VITAMIN D) 2000 UNITS CAPS Take by mouth. Take 4 capsules by mouth once daily   . Cyanocobalamin 500 MCG/0.1ML SOLN  Place 1 mL under the tongue daily.   . folic acid (FOLVITE) 671 MCG tablet Take 800 mcg by mouth daily.   . furosemide (LASIX) 40 MG tablet TAKE ONE TABLET BY MOUTH TWICE DAILY FOR  FLUID  RETENTION/ANKLE  SWELLING  . hydroxychloroquine (PLAQUENIL) 200 MG tablet TAKE 1 TABLET BY MOUTH  TWICE A DAY  . levothyroxine (SYNTHROID, LEVOTHROID) 100 MCG tablet TAKE 1 TABLET BY MOUTH  DAILY  . methotrexate (RHEUMATREX) 2.5 MG tablet TAKE 10 TABLETS ONE TIME WEEKLY  . montelukast (SINGULAIR) 10 MG tablet TAKE 1 TABLET BY MOUTH  DAILY  . ondansetron (ZOFRAN ODT) 8 MG disintegrating tablet 8mg  ODT q4 hours prn nausea  . pravastatin (PRAVACHOL) 40 MG tablet Take 1 tablet (40 mg total) by mouth daily.  . propranolol (INDERAL) 80 MG tablet TAKE 1 TABLET BY MOUTH 3  TIMES DAILY  . ranitidine (ZANTAC) 300 MG tablet TAKE 1 TABLET BY MOUTH AT  BEDTIME  . traMADol (ULTRAM) 50 MG tablet Take 1 tablet (50 mg total) by mouth 4 (four) times daily.   No current facility-administered medications on file prior to visit.    Allergies  Allergen Reactions  . Codeine Nausea Only  . Mysoline [Primidone] Nausea Only and Other (See Comments)    Over-sedated   Past Medical History:  Diagnosis Date  . Anemia   . Cataract   . Environmental allergies   . Gastric ulcer   . Heartburn   . Hiatal hernia   . Hyperlipidemia   . Hypertension   . Hypothyroidism   .  Migraines   . Prediabetes   . Rash october 2012   all over abdomen and breasts  . Rheumatoid arthritis(714.0)   . Tremor, essential    right hand   Health Maintenance  Topic Date Due  . INFLUENZA VACCINE  06/21/2017  . TETANUS/TDAP  11/10/2025  . DEXA SCAN  Completed  . PNA vac Low Risk Adult  Completed   Immunization History  Administered Date(s) Administered  . DT 02/05/2015  . Influenza Split 09/12/2013  . Influenza, High Dose Seasonal PF 10/28/2014, 08/18/2016  . Pneumococcal Conjugate-13 05/12/2015  . Pneumococcal-Unspecified 11/21/2006  . Td  11/22/2003  . Varicella 06/23/2011  . Zoster 06/23/2011   Last Colon -  Last MGM -  Past Surgical History:  Procedure Laterality Date  . ABDOMINAL HYSTERECTOMY    . CATARACT EXTRACTION, BILATERAL    . CHOLECYSTECTOMY  08/31/11   lap chole   . COLONOSCOPY    . TUBAL LIGATION     Family History  Problem Relation Age of Onset  . Heart disease Mother   . Diabetes Mother   . Heart disease Father   . Heart disease Maternal Aunt        x3   . Heart disease Maternal Uncle        x 4  . Diabetes Sister        borderline  . Heart attack Son 51       Sudden cardiac death  . Colon cancer Neg Hx    Social History   Tobacco Use  . Smoking status: Former Smoker    Types: Cigarettes    Last attempt to quit: 09/18/1984    Years since quitting: 33.3  . Smokeless tobacco: Never Used  . Tobacco comment: stopped around 25 years ago  Substance Use Topics  . Alcohol use: No  . Drug use: No    ROS Constitutional: Denies fever, chills, weight loss/gain, headaches, insomnia,  night sweats, and change in appetite. Does c/o fatigue. Eyes: Denies redness, blurred vision, diplopia, discharge, itchy, watery eyes.  ENT: Denies discharge, congestion, post nasal drip, epistaxis, sore throat, earache, hearing loss, dental pain, Tinnitus, Vertigo, Sinus pain, snoring.  Cardio: Denies chest pain, palpitations, irregular heartbeat, syncope, dyspnea, diaphoresis, orthopnea, PND, claudication, edema Respiratory: denies cough, dyspnea, DOE, pleurisy, hoarseness, laryngitis, wheezing.  Gastrointestinal: Denies dysphagia, heartburn, reflux, water brash, pain, cramps, nausea, vomiting, bloating, diarrhea, constipation, hematemesis, melena, hematochezia, jaundice, hemorrhoids Genitourinary: Denies dysuria, frequency, urgency, nocturia, hesitancy, discharge, hematuria, flank pain Breast: Breast lumps, nipple discharge, bleeding.  Musculoskeletal: Denies arthralgia, myalgia, stiffness, Jt. Swelling, pain, limp,  and strain/sprain. Denies falls. Skin: Denies puritis, rash, hives, warts, acne, eczema, changing in skin lesion Neuro: No weakness, tremor, incoordination, spasms, paresthesia, pain Psychiatric: Denies confusion, memory loss, sensory loss. Denies Depression. Endocrine: Denies change in weight, skin, hair change, nocturia, and paresthesia, diabetic polys, visual blurring, hyper / hypo glycemic episodes.  Heme/Lymph: No excessive bleeding, bruising, enlarged lymph nodes.  Physical Exam  BP 130/66   Pulse (!) 56   Temp (!) 97 F (36.1 C)   Resp 16   Ht 5\' 3"  (1.6 m)   Wt 168 lb 3.2 oz (76.3 kg)   BMI 29.80 kg/m   General Appearance: Well nourished, well groomed and in no apparent distress.  Eyes: PERRLA, EOMs, conjunctiva no swelling or erythema, normal fundi and vessels. Sinuses: No frontal/maxillary tenderness ENT/Mouth: EACs patent / TMs  nl. Nares clear without erythema, swelling, mucoid exudates. Oral hygiene is good. No erythema,  swelling, or exudate. Tongue normal, non-obstructing. Tonsils not swollen or erythematous. Hearing normal.  Neck: Supple, thyroid not palpable. No bruits, nodes or JVD. Respiratory: Respiratory effort normal.  BS equal and clear bilateral without rales, rhonci, wheezing or stridor. Cardio: Heart sounds are normal with regular rate and rhythm and no murmurs, rubs or gallops. Peripheral pulses are normal and equal bilaterally without edema. No aortic or femoral bruits. Chest: symmetric with normal excursions and percussion. Breasts: Symmetric, without lumps, nipple discharge, retractions, or fibrocystic changes.  Abdomen: Flat, soft with bowel sounds active. Nontender, no guarding, rebound, hernias, masses, or organomegaly.  Lymphatics: Non tender without lymphadenopathy.  Genitourinary:  Musculoskeletal: Full ROM all peripheral extremities, joint stability, 5/5 strength, and normal gait. Skin: Warm and dry without rashes, lesions, cyanosis, clubbing or   ecchymosis.  Neuro: Cranial nerves intact, reflexes equal bilaterally. Normal muscle tone, no cerebellar symptoms. Sensation intact. Mild head titubation and high frequency low amplitude tremor tremor of hands Pysch: Alert and oriented X 3, normal affect, Insight and Judgment appropriate.   Assessment and Plan  1. Annual Preventative Screening Examination  2. Essential hypertension  - EKG 12-Lead - Urinalysis, Routine w reflex microscopic - Microalbumin / creatinine urine ratio - CBC with Differential/Platelet - BASIC METABOLIC PANEL WITH GFR - Magnesium - TSH  3. Hyperlipidemia, mixed  - EKG 12-Lead - Hepatic function panel - Lipid panel - TSH  4. Blood glucose abnormal  - Hemoglobin A1c - Insulin, random  5. Vitamin D deficiency  - VITAMIN D 25 Hydroxyl  6. Prediabetes  - Hemoglobin A1c - Insulin, random  7. Hypothyroidism  - TSH  8. Rheumatoid arthritis involving multiple sites, ([place])  9. Hereditary essential tremor  10. Stage 3 chronic kidney disease (HCC)  - Urinalysis, Routine w reflex microscopic - Microalbumin / creatinine urine ratio - BASIC METABOLIC PANEL WITH GFR  11. Screening for colorectal cancer  - POC Hemoccult Bld/Stl   12. Screening for ischemic heart disease  - EKG 12-Lead  13. Former smoker  - EKG 12-Lead  14. FH: heart disease  - EKG 12-Lead  15. Medication management  - Urinalysis, Routine w reflex microscopic - Microalbumin / creatinine urine ratio - CBC with Differential/Platelet - BASIC METABOLIC PANEL WITH GFR - Hepatic function panel - Magnesium - Lipid panel - TSH - Hemoglobin A1c - Insulin, random - VITAMIN D 25 Hydroxy         Patient was counseled in prudent diet to achieve/maintain BMI less than 25 for weight control, BP monitoring, regular exercise and medications. Discussed med's effects and SE's. Screening labs and tests as requested with regular follow-up as recommended. Over 40 minutes of exam,  counseling, chart review and high complex critical decision making was performed.

## 2018-01-26 LAB — BASIC METABOLIC PANEL WITH GFR
BUN / CREAT RATIO: 12 (calc) (ref 6–22)
BUN: 12 mg/dL (ref 7–25)
CALCIUM: 9.4 mg/dL (ref 8.6–10.4)
CO2: 33 mmol/L — ABNORMAL HIGH (ref 20–32)
Chloride: 100 mmol/L (ref 98–110)
Creat: 0.99 mg/dL — ABNORMAL HIGH (ref 0.60–0.93)
GFR, EST AFRICAN AMERICAN: 63 mL/min/{1.73_m2} (ref >=60)
GFR, EST NON AFRICAN AMERICAN: 55 mL/min/{1.73_m2} — AB (ref >=60)
Glucose, Bld: 86 mg/dL (ref 65–99)
POTASSIUM: 3.8 mmol/L (ref 3.5–5.3)
SODIUM: 143 mmol/L (ref 135–146)

## 2018-01-26 LAB — HEPATIC FUNCTION PANEL
AG Ratio: 2 (calc) (ref 1.0–2.5)
ALT: 13 U/L (ref 6–29)
AST: 23 U/L (ref 10–35)
Albumin: 4.2 g/dL (ref 3.6–5.1)
Alkaline phosphatase (APISO): 75 U/L (ref 33–130)
BILIRUBIN DIRECT: 0.1 mg/dL (ref 0.0–0.2)
BILIRUBIN INDIRECT: 0.5 mg/dL (ref 0.2–1.2)
BILIRUBIN TOTAL: 0.6 mg/dL (ref 0.2–1.2)
Globulin: 2.1 g/dL (calc) (ref 1.9–3.7)
Total Protein: 6.3 g/dL (ref 6.1–8.1)

## 2018-01-26 LAB — URINALYSIS, ROUTINE W REFLEX MICROSCOPIC
BILIRUBIN URINE: NEGATIVE
Bacteria, UA: NONE SEEN /HPF
GLUCOSE, UA: NEGATIVE
HYALINE CAST: NONE SEEN /LPF
Hgb urine dipstick: NEGATIVE
Ketones, ur: NEGATIVE
NITRITE: NEGATIVE
Protein, ur: NEGATIVE
RBC / HPF: NONE SEEN /HPF (ref 0–2)
SPECIFIC GRAVITY, URINE: 1.009 (ref 1.001–1.03)
Squamous Epithelial / LPF: NONE SEEN /HPF (ref <=5)
WBC, UA: NONE SEEN /HPF (ref 0–5)
pH: 6.5 (ref 5.0–8.0)

## 2018-01-26 LAB — HEMOGLOBIN A1C
EAG (MMOL/L): 5.8 (calc)
HEMOGLOBIN A1C: 5.3 %{Hb} (ref <5.7)
Mean Plasma Glucose: 105 (calc)

## 2018-01-26 LAB — MICROALBUMIN / CREATININE URINE RATIO
CREATININE, URINE: 30 mg/dL (ref 20–275)
Microalb, Ur: <0.2 mg/dL

## 2018-01-26 LAB — LIPID PANEL
Cholesterol: 168 mg/dL (ref <200)
HDL: 66 mg/dL (ref >50)
LDL Cholesterol (Calc): 80 mg/dL (calc)
NON-HDL CHOLESTEROL (CALC): 102 mg/dL (ref <130)
TRIGLYCERIDES: 121 mg/dL (ref <150)
Total CHOL/HDL Ratio: 2.5 (calc) (ref <5.0)

## 2018-01-26 LAB — CBC WITH DIFFERENTIAL/PLATELET
BASOS PCT: 0.7 %
Basophils Absolute: 32 cells/uL (ref 0–200)
EOS PCT: 2 %
Eosinophils Absolute: 90 cells/uL (ref 15–500)
HCT: 34.9 % — ABNORMAL LOW (ref 35.0–45.0)
Hemoglobin: 11.7 g/dL (ref 11.7–15.5)
LYMPHS ABS: 1557 {cells}/uL (ref 850–3900)
MCH: 31.2 pg (ref 27.0–33.0)
MCHC: 33.5 g/dL (ref 32.0–36.0)
MCV: 93.1 fL (ref 80.0–100.0)
MPV: 10 fL (ref 7.5–12.5)
Monocytes Relative: 10 %
NEUTROS PCT: 52.7 %
Neutro Abs: 2372 cells/uL (ref 1500–7800)
PLATELETS: 241 10*3/uL (ref 140–400)
RBC: 3.75 10*6/uL — AB (ref 3.80–5.10)
RDW: 14.5 % (ref 11.0–15.0)
TOTAL LYMPHOCYTE: 34.6 %
WBC: 4.5 10*3/uL (ref 3.8–10.8)
WBCMIX: 450 {cells}/uL (ref 200–950)

## 2018-01-26 LAB — TSH: TSH: 2.24 m[IU]/L (ref 0.40–4.50)

## 2018-01-26 LAB — INSULIN, RANDOM: Insulin: 6.3 u[IU]/mL (ref 2.0–19.6)

## 2018-01-26 LAB — MAGNESIUM: Magnesium: 2.3 mg/dL (ref 1.5–2.5)

## 2018-01-26 LAB — VITAMIN D 25 HYDROXY (VIT D DEFICIENCY, FRACTURES): Vit D, 25-Hydroxy: 53 ng/mL (ref 30–100)

## 2018-01-28 ENCOUNTER — Encounter: Payer: Self-pay | Admitting: Internal Medicine

## 2018-03-27 DIAGNOSIS — D485 Neoplasm of uncertain behavior of skin: Secondary | ICD-10-CM | POA: Diagnosis not present

## 2018-04-07 ENCOUNTER — Encounter (HOSPITAL_COMMUNITY): Payer: Self-pay | Admitting: Emergency Medicine

## 2018-04-07 ENCOUNTER — Ambulatory Visit (INDEPENDENT_AMBULATORY_CARE_PROVIDER_SITE_OTHER): Payer: Medicare Other

## 2018-04-07 ENCOUNTER — Ambulatory Visit (HOSPITAL_COMMUNITY)
Admission: EM | Admit: 2018-04-07 | Discharge: 2018-04-07 | Disposition: A | Discharge status: Home / Self Care | Payer: Medicare Other | Attending: Internal Medicine | Admitting: Internal Medicine

## 2018-04-07 DIAGNOSIS — W19XXXA Unspecified fall, initial encounter: Secondary | ICD-10-CM

## 2018-04-07 DIAGNOSIS — Z5189 Encounter for other specified aftercare: Secondary | ICD-10-CM

## 2018-04-07 DIAGNOSIS — M79672 Pain in left foot: Secondary | ICD-10-CM

## 2018-04-07 DIAGNOSIS — S7012XA Contusion of left thigh, initial encounter: Secondary | ICD-10-CM

## 2018-04-07 DIAGNOSIS — S92355A Nondisplaced fracture of fifth metatarsal bone, left foot, initial encounter for closed fracture: Secondary | ICD-10-CM

## 2018-04-07 DIAGNOSIS — S71102A Unspecified open wound, left thigh, initial encounter: Secondary | ICD-10-CM | POA: Diagnosis not present

## 2018-04-07 DIAGNOSIS — S92352A Displaced fracture of fifth metatarsal bone, left foot, initial encounter for closed fracture: Secondary | ICD-10-CM | POA: Diagnosis not present

## 2018-04-07 MED ORDER — DOXYCYCLINE HYCLATE 100 MG PO CAPS: 100.0000 mg | ORAL_CAPSULE | Freq: Two times a day (BID) | ORAL | 0 refills | Status: AC

## 2018-04-07 NOTE — ED Triage Notes (Signed)
Pt here for wound check to left groin area where mass removed; pt sts trip and fall with left foot pain

## 2018-04-07 NOTE — ED Provider Notes (Signed)
[name]    CSN: [id number] Arrival date & time: 04/07/18  1703     History   Chief Complaint Chief Complaint  Patient presents with  . Wound Check  . Foot Pain    HPI Dana [name] is a 77 y.o. female history of hypertension, hyperlipidemia presenting today for evaluation of wound check and left foot pain.  Patient states that she had an area on her left groin biopsied and frozen off by dermatology on 5/7.  She believes this was a squamous cell carcinoma.  Since this area has failed to improve and she has noticed increased redness around this area.  She is concerned about an infection getting into her blood.  Does endorse pain/tenderness to area.  Denies any drainage.  Patient also notes that yesterday morning she was going to the restroom and she ended up hitting her left foot on the base of the toilet and ended up falling.  She denies hitting her head or loss of consciousness.  Denies any shortness of breath or difficulty breathing.  Has noticed some bruising down the posterior aspect of her left thigh.  Ambulating, but endorsing pain with this.  HPI  Past Medical History:  Diagnosis Date  . Anemia   . Cataract   . Environmental allergies   . Gastric ulcer   . Heartburn   . Hiatal hernia   . Hyperlipidemia   . Hypertension   . Hypothyroidism   . Migraines   . Prediabetes   . Rash october 2012   all over abdomen and breasts  . Rheumatoid arthritis(714.0)   . Tremor, essential    right hand    Patient Active Problem List   Diagnosis Date Noted  . Stage 3 chronic kidney disease ([place]) 10/08/2017  . Generalized OA 09/02/2015  . Other long term (current) drug therapy 09/02/2015  . Vitamin D deficiency 01/06/2014  . Medication management 01/06/2014  . Hypertension   . Other abnormal glucose   . Rheumatoid arthritis ([place])   . Hereditary essential tremor   . Hypothyroidism   . Migraines   . Hyperlipidemia   . Anemia     Past Surgical History:    Procedure Laterality Date  . ABDOMINAL HYSTERECTOMY    . CATARACT EXTRACTION, BILATERAL    . CHOLECYSTECTOMY  08/31/11   lap chole   . COLONOSCOPY    . TUBAL LIGATION      OB History   None      Home Medications    Prior to Admission medications   Medication Sig Start Date End Date Taking? Authorizing Provider  Cholecalciferol (VITAMIN D) 2000 UNITS CAPS Take by mouth. Take 4 capsules by mouth once daily     [provider]  Cyanocobalamin 500 MCG/0.1ML SOLN Place 1 mL under the tongue daily.     [provider]  doxycycline (VIBRAMYCIN) 100 MG capsule Take 1 capsule (100 mg total) by mouth 2 (two) times daily for 10 days. 04/07/18 04/17/18  Adreana Coull C, PA-C  folic acid (FOLVITE) 132 MCG tablet Take 800 mcg by mouth daily.     [provider]  furosemide (LASIX) 40 MG tablet TAKE ONE TABLET BY MOUTH TWICE DAILY FOR  FLUID  RETENTION/ANKLE  SWELLING 01/15/18   Unk Pinto, MD  hydroxychloroquine (PLAQUENIL) 200 MG tablet TAKE 1 TABLET BY MOUTH  TWICE A DAY 03/22/17   [provider]  levothyroxine (SYNTHROID, LEVOTHROID) 100 MCG tablet TAKE 1 TABLET BY MOUTH  DAILY 12/04/17  Vicie Mutters, PA-C  methotrexate (RHEUMATREX) 2.5 MG tablet TAKE 10 TABLETS ONE TIME WEEKLY 02/10/17   [provider]  montelukast (SINGULAIR) 10 MG tablet TAKE 1 TABLET BY MOUTH  DAILY 01/13/18   Unk Pinto, MD  ondansetron (ZOFRAN ODT) 8 MG disintegrating tablet 8mg  ODT q4 hours prn nausea 04/19/16   Forcucci, Courtney, PA-C  pravastatin (PRAVACHOL) 40 MG tablet Take 1 tablet (40 mg total) by mouth daily. 01/15/18   Unk Pinto, MD  propranolol (INDERAL) 80 MG tablet TAKE 1 TABLET BY MOUTH 3  TIMES DAILY 01/13/18   Unk Pinto, MD  ranitidine (ZANTAC) 300 MG tablet TAKE 1 TABLET BY MOUTH AT  BEDTIME 10/16/17   Vicie Mutters, PA-C  traMADol (ULTRAM) 50 MG tablet Take 1 tablet (50 mg total) by mouth 4 (four) times daily. 05/30/16   Unk Pinto, MD    Family History Family History  Problem Relation Age of Onset  . Heart disease Mother   . Diabetes Mother   . Heart disease Father   . Heart disease Maternal Aunt        x3   . Heart disease Maternal Uncle        x 4  . Diabetes Sister        borderline  . Heart attack Son 86       Sudden cardiac death  . Colon cancer Neg Hx     Social History Social History   Tobacco Use  . Smoking status: Former Smoker    Types: Cigarettes    Last attempt to quit: 09/18/1984    Years since quitting: 33.5  . Smokeless tobacco: Never Used  . Tobacco comment: stopped around 25 years ago  Substance Use Topics  . Alcohol use: No  . Drug use: No     Allergies   Codeine and Mysoline [primidone]   Review of Systems Review of Systems  Constitutional: Negative for fatigue and fever.  HENT: Negative for mouth sores.   Eyes: Negative for visual disturbance.  Respiratory: Negative for shortness of breath.   Cardiovascular: Negative for chest pain.  Gastrointestinal: Negative for abdominal pain, nausea and vomiting.  Genitourinary: Negative for genital sores.  Musculoskeletal: Positive for arthralgias, gait problem, joint swelling and myalgias. Negative for back pain, neck pain and neck stiffness.  Skin: Positive for color change and rash. Negative for wound.  Neurological: Negative for dizziness, weakness, light-headedness and headaches.     Physical Exam Triage Vital Signs ED Triage Vitals [04/07/18 1824]  Enc Vitals Group     BP (!) 139/54     Pulse Rate (!) 58     Resp 18     Temp (!) 97.5 F (36.4 C)     Temp Source Oral     SpO2 100 %     Weight      Height      Head Circumference      Peak Flow      Pain Score      Pain Loc      Pain Edu?      Excl. in [place]?    No data found.  Updated Vital Signs BP (!) 139/54 (BP Location: Left Arm)   Pulse (!) 58   Temp (!) 97.5 F (36.4 C) (Oral)   Resp 18   SpO2 100%   Visual Acuity Right Eye Distance:     Left Eye Distance:   Bilateral Distance:    Right Eye Near:   Left Eye Near:  Bilateral Near:     Physical Exam  Constitutional: She is oriented to person, place, and time. She appears well-developed and well-nourished. No distress.  HENT:  Head: Normocephalic and atraumatic.  Eyes: Conjunctivae are normal.  Neck: Neck supple.  Cardiovascular: Normal rate and regular rhythm.  No murmur heard. Pulmonary/Chest: Effort normal and breath sounds normal. No respiratory distress.  Breathing comfortably at rest, CTABL, no wheezing, rales or other adventitious sounds auscultated  Abdominal: Soft. There is no tenderness.  Musculoskeletal: She exhibits edema and tenderness.  Lateral aspect of left foot with bruising running along fifth metatarsal, swelling present throughout dorsum of left foot, tenderness to palpation along fifth MTP, nontender to palpation of medial and lateral malleolus, full range of motion at the ankle, ambulating with antalgic gait.  Dorsalis pedis 2+  Neurological: She is alert and oriented to person, place, and time.  Patient A&O x3, cranial nerves II-XII grossly intact, strength at shoulders, hips and knees 5/5, equal bilaterally   Skin: Skin is warm and dry. There is erythema.  Significant bruising/ecchymosis to posterior aspect of left thigh extending through majority of femoral area nontender to palpation over bony landmarks of hip.  2 cm area to left groin with central yellowish coloration, surrounding erythema, tenderness to palpation in the area, no draining visualized  Psychiatric: She has a normal mood and affect.  Nursing note and vitals reviewed.    UC Treatments / Results  Labs (all labs ordered are listed, but only abnormal results are displayed) Labs Reviewed - No data to display  EKG None  Radiology Dg Foot Complete Left  Result Date: 04/07/2018 CLINICAL DATA:  Fall, bruising and swelling along 5th metatarsal EXAM: LEFT FOOT - COMPLETE 3+  VIEW COMPARISON:  None. FINDINGS: Fracture noted through the distal aspect of the left 5th metatarsal shaft with minimal displacement. No subluxation or dislocation. Degenerative changes at the 1st MTP joint and in the midfoot. Plantar calcaneal spur. IMPRESSION: Minimally displaced fracture through the distal shaft of the left 5th metatarsal. Electronically Signed   By: Rolm Baptise M.D.   On: 04/07/2018 19:37    Procedures Procedures (including critical care time)  Medications Ordered in UC Medications - No data to display  Initial Impression / Assessment and Plan / UC Course  I have reviewed the triage vital signs and the nursing notes.  Pertinent labs & imaging results that were available during my care of the patient were reviewed by me and considered in my medical decision making (see chart for details).     Wound on groin with possible cellulitis, will initiate doxycycline.  Will have patient follow-up with dermatology this week for wound check.  Continue to monitor redness, size, drainage, pain and swelling, return if symptoms continuing to worsen.  Minimally displaced fracture at fifth metatarsal at the distal aspect, will provide patient with boot, follow-up with orthopedics.  Discussed with patient being careful with walking as this may increase her risk of falling.  Will avoid crutches at this time and have patient weight-bear and follow-up with Ortho.  NSAIDs, ice, elevation. Discussed strict return precautions. Patient verbalized understanding and is agreeable with plan.  Final Clinical Impressions(s) / UC Diagnoses   Final diagnoses:  Visit for wound check  Closed nondisplaced fracture of fifth metatarsal bone of left foot, initial encounter     Discharge Instructions     There is a fracture to your foot- please wear boot for 4-6 weeks  Follow up with orthopedics in 1 week  Begin doxycycline and follow up with dermatology  Continue to monitor wound on groin- return  if increasing in size or worsening    ED Prescriptions    Medication Sig Dispense Auth. Provider   doxycycline (VIBRAMYCIN) 100 MG capsule Take 1 capsule (100 mg total) by mouth 2 (two) times daily for 10 days. 20 capsule Gerre Ranum C, PA-C     Controlled Substance Prescriptions [place] Controlled Substance Registry consulted? Not Applicable   Janith Lima, [place] 04/08/18 1029

## 2018-04-07 NOTE — Discharge Instructions (Addendum)
There is a fracture to your foot- please wear boot for 4-6 weeks  Follow up with orthopedics in 1 week  Begin doxycycline and follow up with dermatology  Continue to monitor wound on groin- return if increasing in size or worsening

## 2018-04-10 DIAGNOSIS — Z85828 Personal history of other malignant neoplasm of skin: Secondary | ICD-10-CM | POA: Diagnosis not present

## 2018-04-10 DIAGNOSIS — Z08 Encounter for follow-up examination after completed treatment for malignant neoplasm: Secondary | ICD-10-CM | POA: Diagnosis not present

## 2018-04-18 DIAGNOSIS — M79672 Pain in left foot: Secondary | ICD-10-CM | POA: Diagnosis not present

## 2018-04-30 ENCOUNTER — Other Ambulatory Visit: Payer: Self-pay | Admitting: Internal Medicine

## 2018-04-30 DIAGNOSIS — E2839 Other primary ovarian failure: Secondary | ICD-10-CM

## 2018-04-30 DIAGNOSIS — S92352S Displaced fracture of fifth metatarsal bone, left foot, sequela: Secondary | ICD-10-CM

## 2018-05-02 DIAGNOSIS — M79672 Pain in left foot: Secondary | ICD-10-CM | POA: Diagnosis not present

## 2018-05-07 ENCOUNTER — Encounter: Payer: Self-pay | Admitting: Internal Medicine

## 2018-05-10 DIAGNOSIS — M0579 Rheumatoid arthritis with rheumatoid factor of multiple sites without organ or systems involvement: Secondary | ICD-10-CM | POA: Diagnosis not present

## 2018-05-10 DIAGNOSIS — Z79899 Other long term (current) drug therapy: Secondary | ICD-10-CM | POA: Diagnosis not present

## 2018-05-18 ENCOUNTER — Other Ambulatory Visit: Payer: Self-pay | Admitting: Physician Assistant

## 2018-05-18 ENCOUNTER — Other Ambulatory Visit: Payer: Self-pay | Admitting: Internal Medicine

## 2018-05-18 DIAGNOSIS — R609 Edema, unspecified: Secondary | ICD-10-CM

## 2018-05-22 ENCOUNTER — Ambulatory Visit: Payer: Self-pay | Admitting: Adult Health

## 2018-05-22 DIAGNOSIS — X32XXXD Exposure to sunlight, subsequent encounter: Secondary | ICD-10-CM | POA: Diagnosis not present

## 2018-05-22 DIAGNOSIS — Z85828 Personal history of other malignant neoplasm of skin: Secondary | ICD-10-CM | POA: Diagnosis not present

## 2018-05-22 DIAGNOSIS — Z08 Encounter for follow-up examination after completed treatment for malignant neoplasm: Secondary | ICD-10-CM | POA: Diagnosis not present

## 2018-05-22 DIAGNOSIS — L570 Actinic keratosis: Secondary | ICD-10-CM | POA: Diagnosis not present

## 2018-05-30 DIAGNOSIS — Z6821 Body mass index (BMI) 21.0-21.9, adult: Secondary | ICD-10-CM | POA: Insufficient documentation

## 2018-05-30 DIAGNOSIS — E663 Overweight: Secondary | ICD-10-CM | POA: Insufficient documentation

## 2018-05-30 DIAGNOSIS — M79672 Pain in left foot: Secondary | ICD-10-CM | POA: Diagnosis not present

## 2018-05-30 NOTE — Progress Notes (Signed)
FOLLOW UP  Assessment and Plan:   Hypertension Having symptomatic hypotension; will cut back on propranolol to BID, if still having symptoms and low BP try cutting down on lasix to once in the afternoons instead of BID - call with any questions or concerns Monitor blood pressure at home; patient to call if consistently greater than 130/80 Continue DASH diet.   Reminder to go to the ER if any CP, SOB, nausea, dizziness, severe HA, changes vision/speech, left arm numbness and tingling and jaw pain.  Cholesterol Currently at goal; continue statin Continue low cholesterol diet and exercise.  Check lipid panel.   Other abnormal glucose Recent A1Cs at goal Discussed diet/exercise, weight management  Defer A1C; check CMP  Hypothyroidism continue medications the same pending lab results reminded to take on an empty stomach 30-64mins before food.  check TSH level  Overweight Long discussion about weight loss, diet, and exercise Recommended diet heavy in fruits and veggies and low in animal meats, cheeses, and dairy products, appropriate calorie intake Discussed ideal weight for height Will follow up in 3 months  Vitamin D Def Below goal at last visit; she has cannot recall if she changed dose  continue supplementation to maintain goal of 70-100 Defer Vit D level  Continue diet and meds as discussed. Further disposition pending results of labs. Discussed med's effects and SE's.   Over 30 minutes of exam, counseling, chart review, and critical decision making was performed.   Future Appointments  Date Time Provider [place]  08/27/2018  2:30 PM Unk Pinto, MD [facility] None  02/19/2019  2:00 PM Unk Pinto, MD [facility] None    ----------------------------------------------------------------------------------------------------------------------  HPI 77 y.o. female  presents for 3 month follow up on hypertension, cholesterol, glucose management, hypothyroid,  weight and vitamin D deficiency. In 2001, she was diagnosed with Rheumatoid Arthritis and is stable on Azulfadine & MTX followed at the [facility] in [place]. She also has Hereditary tremor stable on Propranolol.  She is currently in a boot for left foot non-displaced ?metatarsal fracture sustained 04/06/2018 when she tripped in the bathroom while walking in the dark. She is seeing Dr. Rip Harbour with follow up in 4 more weeks.   BMI is Body mass index is 29.41 kg/m., she has been working on diet, exercise has been limited due to fracture.  Wt Readings from Last 3 Encounters:  05/31/18 166 lb (75.3 kg)  01/25/18 168 lb 3.2 oz (76.3 kg)  10/09/17 169 lb (76.7 kg)   Her blood pressure has been controlled at home, she reports running 91-115/55-63, today their BP is BP: (!) 104/56  She does not workout. She denies chest pain, shortness of breath, she does endorse dizziness and fatigue.    She is on cholesterol medication (pravastatin 40 mg daily) and denies myalgias. Her cholesterol is at goal. The cholesterol last visit was:   Lab Results  Component Value Date   CHOL 168 01/25/2018   HDL 66 01/25/2018   LDLCALC 80 01/25/2018   TRIG 121 01/25/2018   CHOLHDL 2.5 01/25/2018    She has been working on diet and exercise for glucose management, and denies foot ulcerations, increased appetite, nausea, paresthesia of the feet, polydipsia, polyuria, visual disturbances, vomiting and weight loss. Last A1C in the office was:  Lab Results  Component Value Date   HGBA1C 5.3 01/25/2018   She is on thyroid medication. Her medication was not changed last visit.   Lab Results  Component Value Date   TSH 2.24 01/25/2018  Patient is on Vitamin D supplement.   Lab Results  Component Value Date   VD25OH 53 01/25/2018        Current Medications:  Current Outpatient Medications on File Prior to Visit  Medication Sig  . Cholecalciferol (VITAMIN D) 2000 UNITS CAPS Take by mouth. Take 4 capsules by mouth once  daily   . Cyanocobalamin 500 MCG/0.1ML SOLN Place 1 mL under the tongue daily.   . folic acid (FOLVITE) 932 MCG tablet Take 800 mcg by mouth daily.   . furosemide (LASIX) 40 MG tablet TAKE 1 TABLET BY MOUTH 2  TIMES DAILY FOR FLUID  RETENTION/ANKLE SWELLING  . hydroxychloroquine (PLAQUENIL) 200 MG tablet TAKE 1 TABLET BY MOUTH  TWICE A DAY  . levothyroxine (SYNTHROID, LEVOTHROID) 100 MCG tablet TAKE 1 TABLET BY MOUTH  DAILY  . methotrexate (RHEUMATREX) 2.5 MG tablet Take 5 tablets on Tues and 5 tablets on Wed  . montelukast (SINGULAIR) 10 MG tablet TAKE 1 TABLET BY MOUTH  DAILY  . ondansetron (ZOFRAN ODT) 8 MG disintegrating tablet 8mg  ODT q4 hours prn nausea  . pravastatin (PRAVACHOL) 40 MG tablet Take 1 tablet (40 mg total) by mouth daily.  . propranolol (INDERAL) 80 MG tablet TAKE 1 TABLET BY MOUTH 3  TIMES DAILY  . ranitidine (ZANTAC) 300 MG tablet TAKE 1 TABLET BY MOUTH AT  BEDTIME  . traMADol (ULTRAM) 50 MG tablet Take 1 tablet (50 mg total) by mouth 4 (four) times daily. (Patient taking differently: Take 50 mg by mouth as needed. )   No current facility-administered medications on file prior to visit.      Allergies:  Allergies  Allergen Reactions  . Codeine Nausea Only  . Mysoline [Primidone] Nausea Only and Other (See Comments)    Over-sedated     Medical History:  Past Medical History:  Diagnosis Date  . Anemia   . Cataract   . Environmental allergies   . Gastric ulcer   . Heartburn   . Hiatal hernia   . Hyperlipidemia   . Hypertension   . Hypothyroidism   . Migraines   . Prediabetes   . Rash october 2012   all over abdomen and breasts  . Rheumatoid arthritis(714.0)   . Tremor, essential    right hand   Family history- Reviewed and unchanged Social history- Reviewed and unchanged   Review of Systems:  Review of Systems  Constitutional: Positive for malaise/fatigue. Negative for weight loss.  HENT: Negative for hearing loss and tinnitus.   Eyes: Negative  for blurred vision and double vision.  Respiratory: Negative for cough, shortness of breath and wheezing.   Cardiovascular: Negative for chest pain, palpitations, orthopnea, claudication and leg swelling.  Gastrointestinal: Negative for abdominal pain, blood in stool, constipation, diarrhea, heartburn, melena, nausea and vomiting.  Genitourinary: Negative.   Musculoskeletal: Positive for falls. Negative for joint pain and myalgias.  Skin: Negative for rash.  Neurological: Positive for dizziness and tremors (Essential tremor). Negative for tingling, sensory change, weakness and headaches.  Endo/Heme/Allergies: Negative for polydipsia.  Psychiatric/Behavioral: Negative.   All other systems reviewed and are negative.     Physical Exam: BP (!) 104/56   Pulse (!) 51   Temp (!) 97.5 F (36.4 C)   Ht 5\' 3"  (1.6 m)   Wt 166 lb (75.3 kg)   SpO2 97%   BMI 29.41 kg/m  Wt Readings from Last 3 Encounters:  05/31/18 166 lb (75.3 kg)  01/25/18 168 lb 3.2 oz (76.3 kg)  10/09/17 169 lb (76.7 kg)   General Appearance: Well nourished, in no apparent distress. Eyes: PERRLA, EOMs, conjunctiva no swelling or erythema Sinuses: No Frontal/maxillary tenderness ENT/Mouth: Ext aud canals clear, TMs without erythema, bulging. No erythema, swelling, or exudate on post pharynx.  Tonsils not swollen or erythematous. Hearing normal.  Neck: Supple, thyroid normal.  Respiratory: Respiratory effort normal, BS equal bilaterally without rales, rhonchi, wheezing or stridor.  Cardio: RRR with no MRGs. Brisk peripheral pulses without edema.  Abdomen: Soft, + BS.  Non tender, no guarding, rebound, hernias, masses. Lymphatics: Non tender without lymphadenopathy.  Musculoskeletal: Full ROM, 5/5 strength, antalgic gait with cane, left foot in boot.  Skin: Warm, dry without rashes, lesions, ecchymosis.  Neuro: Cranial nerves intact. No cerebellar symptoms. She has subtle head and vocal tremor.  Psych: Awake and  oriented X 3, normal affect, Insight and Judgment appropriate.    Izora Ribas, NP 3:50 PM [facility]

## 2018-05-31 ENCOUNTER — Encounter: Payer: Self-pay | Admitting: Adult Health

## 2018-05-31 ENCOUNTER — Ambulatory Visit (INDEPENDENT_AMBULATORY_CARE_PROVIDER_SITE_OTHER): Payer: Medicare Other | Admitting: Adult Health

## 2018-05-31 VITALS — BP 104/56 | HR 51 | Temp 97.5°F | Ht 63.0 in | Wt 166.0 lb

## 2018-05-31 DIAGNOSIS — D649 Anemia, unspecified: Secondary | ICD-10-CM

## 2018-05-31 DIAGNOSIS — E782 Mixed hyperlipidemia: Secondary | ICD-10-CM | POA: Diagnosis not present

## 2018-05-31 DIAGNOSIS — I10 Essential (primary) hypertension: Secondary | ICD-10-CM

## 2018-05-31 DIAGNOSIS — N183 Chronic kidney disease, stage 3 unspecified: Secondary | ICD-10-CM

## 2018-05-31 DIAGNOSIS — E039 Hypothyroidism, unspecified: Secondary | ICD-10-CM | POA: Diagnosis not present

## 2018-05-31 DIAGNOSIS — E663 Overweight: Secondary | ICD-10-CM | POA: Diagnosis not present

## 2018-05-31 DIAGNOSIS — E559 Vitamin D deficiency, unspecified: Secondary | ICD-10-CM

## 2018-05-31 DIAGNOSIS — Z79899 Other long term (current) drug therapy: Secondary | ICD-10-CM | POA: Diagnosis not present

## 2018-05-31 DIAGNOSIS — R7309 Other abnormal glucose: Secondary | ICD-10-CM | POA: Diagnosis not present

## 2018-05-31 MED ORDER — PROPRANOLOL HCL 80 MG PO TABS: 80.0000 mg | ORAL_TABLET | Freq: Two times a day (BID) | ORAL | 1 refills | Status: DC

## 2018-05-31 NOTE — Patient Instructions (Signed)
Try cutting down on propranolol to twice day and see how your dizziness/fatigue does  If you are still dizzy, try cutting down your lasix to once a day in the afternoon instead of twice daily- restart this if you have shortness of breath and swelling   Please monitor your blood pressure, as we get older our body can not respond to a low blood pressure as well as it did when we were younger, for this reason we want a bit higher of a blood pressure as you get older to avoid dizziness and fatigue which can lead to falls. [place] call if your blood pressure is consistently above 150/90.    Please monitor your blood pressure. If it is getting below 130/80 AND you are having fatigue with exertion, dizziness we may need to cut your blood pressure medication in half. Please call the office if this is happening. Hypotension As your heart beats, it forces blood through your body. This force is called blood pressure. If you have hypotension, you have low blood pressure. When your blood pressure is too low, you may not get enough blood to your brain. You may feel weak, feel lightheaded, have a fast heartbeat, or even pass out (faint). HOME CARE  Drink enough fluids to keep your pee (urine) clear or pale yellow.  Take all medicines as told by your doctor.  Get up slowly after sitting or lying down.  Wear support stockings as told by your doctor.  Maintain a healthy diet by including foods such as fruits, vegetables, nuts, whole grains, and lean meats. GET HELP IF:  You are throwing up (vomiting) or have watery poop (diarrhea).  You have a fever for more than 2-3 days.  You feel more thirsty than usual.  You feel weak and tired. GET HELP RIGHT AWAY IF:   You pass out (faint).  You have chest pain or a fast or irregular heartbeat.  You lose feeling in part of your body.  You cannot move your arms or legs.  You have trouble speaking.  You get sweaty or feel lightheaded. MAKE SURE YOU:    Understand these instructions.  Will watch your condition.  Will get help right away if you are not doing well or get worse. Document Released: 02/01/2010 Document Revised: 07/10/2013 Document Reviewed: 05/10/2013 [facility] Patient Information 2015 [place], [place]. This information is not intended to replace advice given to you by your health care provider. Make sure you discuss any questions you have with your health care provider.

## 2018-06-01 LAB — COMPLETE METABOLIC PANEL WITH GFR
AG RATIO: 1.7 (calc) (ref 1.0–2.5)
ALT: 12 U/L (ref 6–29)
AST: 21 U/L (ref 10–35)
Albumin: 3.8 g/dL (ref 3.6–5.1)
Alkaline phosphatase (APISO): 73 U/L (ref 33–130)
BILIRUBIN TOTAL: 0.4 mg/dL (ref 0.2–1.2)
BUN: 11 mg/dL (ref 7–25)
CHLORIDE: 104 mmol/L (ref 98–110)
CO2: 33 mmol/L — ABNORMAL HIGH (ref 20–32)
Calcium: 9.2 mg/dL (ref 8.6–10.4)
Creat: 0.68 mg/dL (ref 0.60–0.93)
GFR, EST AFRICAN AMERICAN: 96 mL/min/{1.73_m2} (ref >=60)
GFR, EST NON AFRICAN AMERICAN: 83 mL/min/{1.73_m2} (ref >=60)
GLUCOSE: 94 mg/dL (ref 65–99)
Globulin: 2.2 g/dL (calc) (ref 1.9–3.7)
Potassium: 4.3 mmol/L (ref 3.5–5.3)
Sodium: 142 mmol/L (ref 135–146)
TOTAL PROTEIN: 6 g/dL — AB (ref 6.1–8.1)

## 2018-06-01 LAB — CBC WITH DIFFERENTIAL/PLATELET
Basophils Absolute: 32 cells/uL (ref 0–200)
Basophils Relative: 0.7 %
Eosinophils Absolute: 153 cells/uL (ref 15–500)
Eosinophils Relative: 3.4 %
HEMATOCRIT: 32.8 % — AB (ref 35.0–45.0)
HEMOGLOBIN: 11.1 g/dL — AB (ref 11.7–15.5)
LYMPHS ABS: 2178 {cells}/uL (ref 850–3900)
MCH: 31.7 pg (ref 27.0–33.0)
MCHC: 33.8 g/dL (ref 32.0–36.0)
MCV: 93.7 fL (ref 80.0–100.0)
MPV: 10 fL (ref 7.5–12.5)
Monocytes Relative: 9 %
NEUTROS ABS: 1733 {cells}/uL (ref 1500–7800)
NEUTROS PCT: 38.5 %
Platelets: 248 10*3/uL (ref 140–400)
RBC: 3.5 10*6/uL — AB (ref 3.80–5.10)
RDW: 14 % (ref 11.0–15.0)
Total Lymphocyte: 48.4 %
WBC: 4.5 10*3/uL (ref 3.8–10.8)
WBCMIX: 405 {cells}/uL (ref 200–950)

## 2018-06-01 LAB — LIPID PANEL
Cholesterol: 158 mg/dL (ref <200)
HDL: 60 mg/dL (ref >50)
LDL Cholesterol (Calc): 75 mg/dL (calc)
NON-HDL CHOLESTEROL (CALC): 98 mg/dL (ref <130)
TRIGLYCERIDES: 144 mg/dL (ref <150)
Total CHOL/HDL Ratio: 2.6 (calc) (ref <5.0)

## 2018-06-01 LAB — TSH: TSH: 1.27 mIU/L (ref 0.40–4.50)

## 2018-06-27 DIAGNOSIS — M79672 Pain in left foot: Secondary | ICD-10-CM | POA: Diagnosis not present

## 2018-07-12 ENCOUNTER — Other Ambulatory Visit: Payer: Self-pay | Admitting: Internal Medicine

## 2018-07-12 DIAGNOSIS — Z1231 Encounter for screening mammogram for malignant neoplasm of breast: Secondary | ICD-10-CM

## 2018-07-20 DIAGNOSIS — H5340 Unspecified visual field defects: Secondary | ICD-10-CM | POA: Diagnosis not present

## 2018-07-20 DIAGNOSIS — H5211 Myopia, right eye: Secondary | ICD-10-CM | POA: Diagnosis not present

## 2018-07-20 DIAGNOSIS — Z961 Presence of intraocular lens: Secondary | ICD-10-CM | POA: Diagnosis not present

## 2018-07-20 DIAGNOSIS — Z79899 Other long term (current) drug therapy: Secondary | ICD-10-CM | POA: Diagnosis not present

## 2018-08-09 DIAGNOSIS — R2681 Unsteadiness on feet: Secondary | ICD-10-CM | POA: Diagnosis not present

## 2018-08-09 DIAGNOSIS — M8000XA Age-related osteoporosis with current pathological fracture, unspecified site, initial encounter for fracture: Secondary | ICD-10-CM | POA: Diagnosis not present

## 2018-08-09 DIAGNOSIS — M810 Age-related osteoporosis without current pathological fracture: Secondary | ICD-10-CM | POA: Diagnosis not present

## 2018-08-09 DIAGNOSIS — Z79899 Other long term (current) drug therapy: Secondary | ICD-10-CM | POA: Diagnosis not present

## 2018-08-09 DIAGNOSIS — M0579 Rheumatoid arthritis with rheumatoid factor of multiple sites without organ or systems involvement: Secondary | ICD-10-CM | POA: Diagnosis not present

## 2018-08-09 LAB — HM HEPATITIS C SCREENING LAB: HM Hepatitis Screen: NEGATIVE

## 2018-08-13 ENCOUNTER — Other Ambulatory Visit: Payer: Self-pay | Admitting: Internal Medicine

## 2018-08-27 ENCOUNTER — Ambulatory Visit: Payer: Self-pay | Admitting: Internal Medicine

## 2018-09-03 ENCOUNTER — Ambulatory Visit (INDEPENDENT_AMBULATORY_CARE_PROVIDER_SITE_OTHER): Payer: Medicare Other | Admitting: Internal Medicine

## 2018-09-03 ENCOUNTER — Encounter: Payer: Self-pay | Admitting: *Deleted

## 2018-09-03 ENCOUNTER — Encounter: Payer: Self-pay | Admitting: Internal Medicine

## 2018-09-03 VITALS — BP 130/72 | HR 60 | Temp 97.6°F | Resp 16 | Ht 63.0 in | Wt 162.6 lb

## 2018-09-03 DIAGNOSIS — I10 Essential (primary) hypertension: Secondary | ICD-10-CM | POA: Diagnosis not present

## 2018-09-03 DIAGNOSIS — Z87891 Personal history of nicotine dependence: Secondary | ICD-10-CM | POA: Insufficient documentation

## 2018-09-03 DIAGNOSIS — E559 Vitamin D deficiency, unspecified: Secondary | ICD-10-CM

## 2018-09-03 DIAGNOSIS — R7303 Prediabetes: Secondary | ICD-10-CM | POA: Diagnosis not present

## 2018-09-03 DIAGNOSIS — M069 Rheumatoid arthritis, unspecified: Secondary | ICD-10-CM

## 2018-09-03 DIAGNOSIS — R7309 Other abnormal glucose: Secondary | ICD-10-CM

## 2018-09-03 DIAGNOSIS — E039 Hypothyroidism, unspecified: Secondary | ICD-10-CM | POA: Diagnosis not present

## 2018-09-03 DIAGNOSIS — Z79899 Other long term (current) drug therapy: Secondary | ICD-10-CM

## 2018-09-03 DIAGNOSIS — E782 Mixed hyperlipidemia: Secondary | ICD-10-CM

## 2018-09-03 DIAGNOSIS — Z8249 Family history of ischemic heart disease and other diseases of the circulatory system: Secondary | ICD-10-CM | POA: Insufficient documentation

## 2018-09-03 DIAGNOSIS — G250 Essential tremor: Secondary | ICD-10-CM

## 2018-09-03 NOTE — Patient Instructions (Signed)

## 2018-09-03 NOTE — Progress Notes (Signed)
This very nice 77 y.o. WWF  presents for 6 month follow up with HTN, HLD, Hypothyroidism,  Pre-Diabetes and Vitamin D Deficiency. Patient also has hx/o RA and hereditary /Essential Tremor.     Patient was dx'd with Rheumatoid Arthritis in 2001 and is stable on MTX & Plaquenil and is followed at [facility]. In Sept, patient had Nl CBC/Diff, Chem/24 and Neg Hepatitis A, B & C.      Patient is treated for HTN (1998)  & BP has been controlled at home. Today's BP is at goal -  130/72. Patient has had no complaints of any cardiac type chest pain, palpitations, dyspnea / orthopnea / PND, dizziness, claudication, or dependent edema.     Hyperlipidemia is controlled with diet & meds. Patient denies myalgias or other med SE's. Last Lipids were at goal: Lab Results  Component Value Date   CHOL 158 05/31/2018   HDL 60 05/31/2018   LDLCALC 75 05/31/2018   TRIG 144 05/31/2018   CHOLHDL 2.6 05/31/2018      Also, the patient has history of PreDiabetes since 2012 and has had no symptoms of reactive hypoglycemia, diabetic polys, paresthesias or visual blurring.  Last A1c was Normal & at goal: Lab Results  Component Value Date   HGBA1C 5.3 01/25/2018      Patient has been on Thyroid Replacement since 1994.      Further, the patient also has history of Vitamin D Deficiency ("17"/2009) and supplements vitamin D without any suspected side-effects. Last vitamin D was near goal in March ("53")  and Vit D level = 89  in Sept at [facility].  Current Outpatient Medications on File Prior to Visit  Medication Sig  . Cholecalciferol (VITAMIN D) 2000 UNITS CAPS Take by mouth. Take 5 capsules by mouth once daily  . Cyanocobalamin 500 MCG/0.1ML SOLN Place 1 mL under the tongue daily.   . folic acid (FOLVITE) 891 MCG tablet Take 800 mcg by mouth daily.   . furosemide (LASIX) 40 MG tablet TAKE 1 TABLET BY MOUTH 2  TIMES DAILY FOR FLUID  RETENTION/ANKLE SWELLING  . hydroxychloroquine (PLAQUENIL) 200 MG tablet TAKE 1  TABLET BY MOUTH  TWICE A DAY  . levothyroxine (SYNTHROID, LEVOTHROID) 100 MCG tablet TAKE 1 TABLET BY MOUTH  DAILY  . methotrexate (RHEUMATREX) 2.5 MG tablet Take 5 tablets on Tues and 5 tablets on Wed  . montelukast (SINGULAIR) 10 MG tablet TAKE 1 TABLET BY MOUTH  DAILY  . ondansetron (ZOFRAN ODT) 8 MG disintegrating tablet 8mg  ODT q4 hours prn nausea  . pravastatin (PRAVACHOL) 40 MG tablet Take 1 tablet (40 mg total) by mouth daily.  . propranolol (INDERAL) 80 MG tablet TAKE 1 TABLET BY MOUTH 3  TIMES DAILY  . traMADol (ULTRAM) 50 MG tablet Take 1 tablet (50 mg total) by mouth 4 (four) times daily. (Patient taking differently: Take 50 mg by mouth as needed. )  . ranitidine (ZANTAC) 300 MG tablet TAKE 1 TABLET BY MOUTH AT  BEDTIME (Patient not taking: Reported on 09/03/2018)   No current facility-administered medications on file prior to visit.    Allergies  Allergen Reactions  . Codeine Nausea Only  . Mysoline [Primidone] Nausea Only and Other (See Comments)    Over-sedated   PMHx:   Past Medical History:  Diagnosis Date  . Anemia   . Cataract   . Environmental allergies   . Gastric ulcer   . Heartburn   . Hiatal hernia   .  Hyperlipidemia   . Hypertension   . Hypothyroidism   . Migraines   . Prediabetes   . Rash october 2012   all over abdomen and breasts  . Rheumatoid arthritis(714.0)   . Tremor, essential    right hand   Immunization History  Administered Date(s) Administered  . DT 02/05/2015  . Influenza Split 09/12/2013  . Influenza, High Dose Seasonal PF 10/28/2014, 08/18/2016  . Pneumococcal Conjugate-13 05/12/2015  . Pneumococcal-Unspecified 11/21/2006  . Td 11/22/2003  . Varicella 06/23/2011  . Zoster 06/23/2011   Past Surgical History:  Procedure Laterality Date  . ABDOMINAL HYSTERECTOMY    . CATARACT EXTRACTION, BILATERAL    . CHOLECYSTECTOMY  08/31/11   lap chole   . COLONOSCOPY    . TUBAL LIGATION     FHx:    Reviewed / unchanged  SHx:     Reviewed / unchanged   Systems Review:  Constitutional: Denies fever, chills, wt changes, headaches, insomnia, fatigue, night sweats, change in appetite. Eyes: Denies redness, blurred vision, diplopia, discharge, itchy, watery eyes.  ENT: Denies discharge, congestion, post nasal drip, epistaxis, sore throat, earache, hearing loss, dental pain, tinnitus, vertigo, sinus pain, snoring.  CV: Denies chest pain, palpitations, irregular heartbeat, syncope, dyspnea, diaphoresis, orthopnea, PND, claudication or edema. Respiratory: denies cough, dyspnea, DOE, pleurisy, hoarseness, laryngitis, wheezing.  Gastrointestinal: Denies dysphagia, odynophagia, heartburn, reflux, water brash, abdominal pain or cramps, nausea, vomiting, bloating, diarrhea, constipation, hematemesis, melena, hematochezia  or hemorrhoids. Genitourinary: Denies dysuria, frequency, urgency, nocturia, hesitancy, discharge, hematuria or flank pain. Musculoskeletal: Denies arthralgias, myalgias, stiffness, jt. swelling, pain, limping or strain/sprain.  Skin: Denies pruritus, rash, hives, warts, acne, eczema or change in skin lesion(s). Neuro: No weakness, tremor, incoordination, spasms, paresthesia or pain. Psychiatric: Denies confusion, memory loss or sensory loss. Endo: Denies change in weight, skin or hair change.  Heme/Lymph: No excessive bleeding, bruising or enlarged lymph nodes.  Physical Exam  BP 130/72   Pulse 60   Temp 97.6 F (36.4 C)   Resp 16   Ht 5\' 3"  (1.6 m)   Wt 162 lb 9.6 oz (73.8 kg)   BMI 28.80 kg/m   Appears  well nourished, well groomed  and in no distress.  Eyes: PERRLA, EOMs, conjunctiva no swelling or erythema. Sinuses: No frontal/maxillary tenderness ENT/Mouth: EAC's clear, TM's nl w/o erythema, bulging. Nares clear w/o erythema, swelling, exudates. Oropharynx clear without erythema or exudates. Oral hygiene is good. Tongue normal, non obstructing. Hearing intact.  Neck: Supple. Thyroid not palpable.  Car 2+/2+ without bruits, nodes or JVD. Chest: Respirations nl with BS clear & equal w/o rales, rhonchi, wheezing or stridor.  Cor: Heart sounds normal w/ regular rate and rhythm without sig. murmurs, gallops, clicks or rubs. Peripheral pulses normal and equal  without edema.  Abdomen: Soft & bowel sounds normal. Non-tender w/o guarding, rebound, hernias, masses or organomegaly.  Lymphatics: Unremarkable.  Musculoskeletal: Full ROM all peripheral extremities, joint stability, 5/5 strength and normal gait.  Skin: Warm, dry without exposed rashes, lesions or ecchymosis apparent.  Neuro: Cranial nerves intact, reflexes equal bilaterally. Sensory-motor testing grossly intact. Tendon reflexes grossly intact.  Pysch: Alert & oriented x 3.  Insight and judgement nl & appropriate. No ideations.  Assessment and Plan:  1. Essential hypertension  - Continue medication, monitor blood pressure at home.  - Continue DASH diet.  Reminder to go to the ER if any CP,  SOB, nausea, dizziness, severe HA, changes vision/speech.  - CBC with Differential/Platelet - COMPLETE METABOLIC PANEL WITH  GFR - Magnesium - TSH  2. Hyperlipidemia, mixed  - Continue diet/meds, exercise,& lifestyle modifications.  - Continue monitor periodic cholesterol/liver & renal functions   - Lipid panel - TSH  3. Abnormal glucose  - Continue diet, exercise,  - lifestyle modifications.  - Monitor appropriate labs.  - Hemoglobin A1c - Insulin, random  4. Vitamin D deficiency  - Continue supplementation.   - VITAMIN D 25 Hydroxyl  5. Hypothyroidism   - TSH  6. Prediabetes  - Hemoglobin A1c - Insulin, random  7. Rheumatoid arthritis involving multiple sites  ([place])  8. Hereditary essential tremor  9. Medication management  - CBC with Differential/Platelet - COMPLETE METABOLIC PANEL WITH GFR - Magnesium - Lipid panel - TSH - Hemoglobin A1c - Insulin, random - VITAMIN D 25 Hydroxyl       Discussed   regular exercise, BP monitoring, weight control to achieve/maintain BMI less than 25 and discussed med and SE's. Recommended labs to assess and monitor clinical status with further disposition pending results of labs. Over 30 minutes of exam, counseling, chart review was performed.

## 2018-09-04 LAB — LIPID PANEL
CHOL/HDL RATIO: 2.8 (calc) (ref <5.0)
CHOLESTEROL: 176 mg/dL (ref <200)
HDL: 64 mg/dL (ref >50)
LDL Cholesterol (Calc): 91 mg/dL (calc)
Non-HDL Cholesterol (Calc): 112 mg/dL (calc) (ref <130)
Triglycerides: 118 mg/dL (ref <150)

## 2018-09-04 LAB — HEMOGLOBIN A1C
Hgb A1c MFr Bld: 5.3 % of total Hgb (ref <5.7)
MEAN PLASMA GLUCOSE: 105 (calc)
eAG (mmol/L): 5.8 (calc)

## 2018-09-04 LAB — INSULIN, RANDOM: Insulin: 1.4 u[IU]/mL — ABNORMAL LOW (ref 2.0–19.6)

## 2018-09-04 LAB — MAGNESIUM: Magnesium: 2.1 mg/dL (ref 1.5–2.5)

## 2018-09-04 LAB — TSH: TSH: 1.1 mIU/L (ref 0.40–4.50)

## 2018-09-05 ENCOUNTER — Ambulatory Visit
Admission: RE | Admit: 2018-09-05 | Discharge: 2018-09-05 | Disposition: A | Discharge status: Home / Self Care | Payer: Medicare Other | Source: Ambulatory Visit | Attending: Internal Medicine | Admitting: Internal Medicine

## 2018-09-05 DIAGNOSIS — Z1231 Encounter for screening mammogram for malignant neoplasm of breast: Secondary | ICD-10-CM

## 2018-09-05 DIAGNOSIS — S92352S Displaced fracture of fifth metatarsal bone, left foot, sequela: Secondary | ICD-10-CM

## 2018-09-05 DIAGNOSIS — E2839 Other primary ovarian failure: Secondary | ICD-10-CM

## 2018-09-05 DIAGNOSIS — Z780 Asymptomatic menopausal state: Secondary | ICD-10-CM | POA: Diagnosis not present

## 2018-09-05 DIAGNOSIS — M85852 Other specified disorders of bone density and structure, left thigh: Secondary | ICD-10-CM | POA: Diagnosis not present

## 2018-10-02 DIAGNOSIS — R2681 Unsteadiness on feet: Secondary | ICD-10-CM | POA: Diagnosis not present

## 2018-10-02 DIAGNOSIS — M6281 Muscle weakness (generalized): Secondary | ICD-10-CM | POA: Diagnosis not present

## 2018-11-23 ENCOUNTER — Encounter: Payer: Self-pay | Admitting: Internal Medicine

## 2018-12-04 NOTE — Progress Notes (Signed)
FOLLOW UP  Assessment and Plan:   Hypertension BP at goal today;  Encouraged to purchase new BP monitor for home use Monitor blood pressure at home; patient to call if consistently greater than 130/80 Continue DASH diet.   Reminder to go to the ER if any CP, SOB, nausea, dizziness, severe HA, changes vision/speech, left arm numbness and tingling and jaw pain.  Cholesterol Currently at goal; continue statin Continue low cholesterol diet and exercise.  Check lipid panel.   Other abnormal glucose Recent A1Cs at goal Discussed diet/exercise, weight management  Defer A1C; check CMP  Hypothyroidism continue medications the same pending lab results reminded to take on an empty stomach 30-36mins before food.  check TSH level  Overweight Long discussion about weight loss, diet, and exercise Recommended diet heavy in fruits and veggies and low in animal meats, cheeses, and dairy products, appropriate calorie intake Discussed ideal weight for height Will follow up in 3 months  Vitamin D Def Below goal at last visit;   continue supplementation to maintain goal of 70-100 Check Vit D level  LE edema No edema despite missing lasix dose this AM - get BP monitor, then can try cutting back to once daily, monitor edema, take second dose only if edema resumes - call to report progress and can update script  Exertional left sided chest pain/dyspnea Patient reports exertional left sided chest pain/dyspnea x 1 year No changes on EKG obtained in this past year ? Reports had stress test in 2015 but cannot locate report in EMR or paper chart Appears previously established with Dr. Lovena Le, will refer back for evaluation and repeat stress test if indicated Go to the ER if any persistent chest pain, shortness of breath, nausea, dizziness  Continue diet and meds as discussed. Further disposition pending results of labs. Discussed med's effects and SE's.   Over 30 minutes of exam, counseling,  chart review, and critical decision making was performed.   Future Appointments  Date Time Provider [place]  03/11/2019  2:00 PM Unk Pinto, MD [id number] None    ----------------------------------------------------------------------------------------------------------------------  HPI 78 y.o. female  presents for 3 month follow up on hypertension, cholesterol, glucose management, hypothyroid, weight and vitamin D deficiency. In 2001, she was diagnosed with Rheumatoid Arthritis and is stable on Azulfadine & MTX followed at the [facility] in [place]. She also has Hereditary tremor stable on Propranolol.  She presents today reporting recovering from URI last week; had sore throat and "couldn't talk for a week" but started feeling better 4-5 days ago and reports mostly back to baseline at this point.   BMI is Body mass index is 27.46 kg/m., she admits she has not been working on diet or exercise. Reports food hasn't been very appetizing and not eating as much.  Wt Readings from Last 3 Encounters:  12/05/18 155 lb (70.3 kg)  09/03/18 162 lb 9.6 oz (73.8 kg)  05/31/18 166 lb (75.3 kg)   Her BP cuff is broken and hasn't been checking BP at home, today their BP is BP: 126/62. She reports she missed her AM lasix dose today but hasn't noted typical LE edema (original cause for prescription) despite this -   She does not workout. She endorses today that she has had intermittent L sided chest pain, she does associate this with exertion (climbing up stairs) and shortness of breath. She reports she saw a cardiologist in 2015 or so prior to gallbladder surgery and had stress test but cannot locate today in EMR  or in paper chart. Does appear she saw Dr. Cristopher Peru in ? 2010 for holter -    She is on cholesterol medication (pravastatin 40 mg daily) and denies myalgias. Her cholesterol is at goal. The cholesterol last visit was:   Lab Results  Component Value Date   CHOL 176 09/03/2018   HDL 64  09/03/2018   LDLCALC 91 09/03/2018   TRIG 118 09/03/2018   CHOLHDL 2.8 09/03/2018    She has been working on diet and exercise for glucose management, and denies foot ulcerations, increased appetite, nausea, paresthesia of the feet, polydipsia, polyuria, visual disturbances, vomiting and weight loss. Last A1C in the office was:  Lab Results  Component Value Date   HGBA1C 5.3 09/03/2018   She is on thyroid medication. Her medication was not changed last visit.   Lab Results  Component Value Date   TSH 1.10 09/03/2018   Patient is on Vitamin D supplement.   Lab Results  Component Value Date   VD25OH 53 01/25/2018       Lab Results  Component Value Date   GFRNONAA 83 05/31/2018        Current Medications:  Current Outpatient Medications on File Prior to Visit  Medication Sig  . Cholecalciferol (VITAMIN D) 2000 UNITS CAPS Take by mouth. Take 5 capsules by mouth once daily  . Cyanocobalamin 500 MCG/0.1ML SOLN Place 1 mL under the tongue daily.   . folic acid (FOLVITE) 409 MCG tablet Take 800 mcg by mouth daily.   . furosemide (LASIX) 40 MG tablet TAKE 1 TABLET BY MOUTH 2  TIMES DAILY FOR FLUID  RETENTION/ANKLE SWELLING  . hydroxychloroquine (PLAQUENIL) 200 MG tablet TAKE 1 TABLET BY MOUTH  TWICE A DAY  . levothyroxine (SYNTHROID, LEVOTHROID) 100 MCG tablet TAKE 1 TABLET BY MOUTH  DAILY  . methotrexate (RHEUMATREX) 2.5 MG tablet Take 5 tablets on Tues and 5 tablets on Wed  . montelukast (SINGULAIR) 10 MG tablet TAKE 1 TABLET BY MOUTH  DAILY  . ondansetron (ZOFRAN ODT) 8 MG disintegrating tablet 8mg  ODT q4 hours prn nausea  . pravastatin (PRAVACHOL) 40 MG tablet Take 1 tablet (40 mg total) by mouth daily.  . propranolol (INDERAL) 80 MG tablet TAKE 1 TABLET BY MOUTH 3  TIMES DAILY  . ranitidine (ZANTAC) 300 MG tablet TAKE 1 TABLET BY MOUTH AT  BEDTIME  . traMADol (ULTRAM) 50 MG tablet Take 1 tablet (50 mg total) by mouth 4 (four) times daily. (Patient taking differently: Take 50  mg by mouth as needed. )   No current facility-administered medications on file prior to visit.      Allergies:  Allergies  Allergen Reactions  . Codeine Nausea Only  . Mysoline [Primidone] Nausea Only and Other (See Comments)    Over-sedated     Medical History:  Past Medical History:  Diagnosis Date  . Anemia   . Cataract   . Environmental allergies   . Gastric ulcer   . Heartburn   . Hiatal hernia   . Hyperlipidemia   . Hypertension   . Hypothyroidism   . Migraines   . Prediabetes   . Rash october 2012   all over abdomen and breasts  . Rheumatoid arthritis(714.0)   . Tremor, essential    right hand   Family history- Reviewed and unchanged Social history- Reviewed and unchanged   Review of Systems:  Review of Systems  Constitutional: Negative for malaise/fatigue and weight loss.  HENT: Negative for hearing loss and tinnitus.  Eyes: Negative for blurred vision and double vision.  Respiratory: Negative for cough, shortness of breath and wheezing.   Cardiovascular: Positive for chest pain (exertional L sided chest pain x 1 year). Negative for palpitations, orthopnea, claudication and leg swelling.  Gastrointestinal: Negative for abdominal pain, blood in stool, constipation, diarrhea, heartburn, melena, nausea and vomiting.  Genitourinary: Negative.   Musculoskeletal: Negative for falls, joint pain and myalgias.  Skin: Negative for rash.  Neurological: Positive for tremors (Essential tremor). Negative for dizziness, tingling, sensory change, weakness and headaches.  Endo/Heme/Allergies: Negative for polydipsia.  Psychiatric/Behavioral: Negative.   All other systems reviewed and are negative.   Physical Exam: BP 126/62   Pulse (!) 58   Temp (!) 97.3 F (36.3 C)   Ht 5\' 3"  (1.6 m)   Wt 155 lb (70.3 kg)   SpO2 99%   BMI 27.46 kg/m  Wt Readings from Last 3 Encounters:  12/05/18 155 lb (70.3 kg)  09/03/18 162 lb 9.6 oz (73.8 kg)  05/31/18 166 lb (75.3 kg)    General Appearance: Well nourished, in no apparent distress. Eyes: PERRLA, EOMs, conjunctiva no swelling or erythema Sinuses: No Frontal/maxillary tenderness ENT/Mouth: L ext aud canal obstructed by wax, R Ext aud canal clear, R TM without erythema, bulging. Posterior pharynx mildly erythematous, without swelling, or exudate on post pharynx.  Tonsils not swollen or notably erythematous, 2 tonsil stones present to right.  Hearing normal.  Neck: Supple, thyroid normal.  Respiratory: Respiratory effort normal, BS equal bilaterally without rales, rhonchi, wheezing or stridor.  Cardio: RRR with no MRGs. Brisk peripheral pulses without edema.  Abdomen: Soft, + BS.  Non tender, no guarding, rebound, hernias, masses. Lymphatics: Non tender without lymphadenopathy.  Musculoskeletal: Full ROM, 5/5 strength, slow steady gait.  Skin: Warm, dry without rashes, lesions, ecchymosis.  Neuro: Cranial nerves intact. No cerebellar symptoms. She has subtle head and vocal tremor.  Psych: Awake and oriented X 3, normal affect, Insight and Judgment appropriate.    Izora Ribas, NP 2:56 PM [facility]

## 2018-12-05 ENCOUNTER — Encounter: Payer: Self-pay | Admitting: Adult Health

## 2018-12-05 ENCOUNTER — Ambulatory Visit (INDEPENDENT_AMBULATORY_CARE_PROVIDER_SITE_OTHER): Payer: Medicare Other | Admitting: Adult Health

## 2018-12-05 VITALS — BP 126/62 | HR 58 | Temp 97.3°F | Ht 63.0 in | Wt 155.0 lb

## 2018-12-05 DIAGNOSIS — E559 Vitamin D deficiency, unspecified: Secondary | ICD-10-CM

## 2018-12-05 DIAGNOSIS — R079 Chest pain, unspecified: Secondary | ICD-10-CM

## 2018-12-05 DIAGNOSIS — M8580 Other specified disorders of bone density and structure, unspecified site: Secondary | ICD-10-CM | POA: Insufficient documentation

## 2018-12-05 DIAGNOSIS — I10 Essential (primary) hypertension: Secondary | ICD-10-CM

## 2018-12-05 DIAGNOSIS — E663 Overweight: Secondary | ICD-10-CM

## 2018-12-05 DIAGNOSIS — E039 Hypothyroidism, unspecified: Secondary | ICD-10-CM

## 2018-12-05 DIAGNOSIS — R7309 Other abnormal glucose: Secondary | ICD-10-CM | POA: Diagnosis not present

## 2018-12-05 DIAGNOSIS — Z79899 Other long term (current) drug therapy: Secondary | ICD-10-CM | POA: Diagnosis not present

## 2018-12-05 DIAGNOSIS — E782 Mixed hyperlipidemia: Secondary | ICD-10-CM | POA: Diagnosis not present

## 2018-12-05 DIAGNOSIS — D649 Anemia, unspecified: Secondary | ICD-10-CM

## 2018-12-05 DIAGNOSIS — M85852 Other specified disorders of bone density and structure, left thigh: Secondary | ICD-10-CM

## 2018-12-05 NOTE — Patient Instructions (Addendum)
Consider adding calcium 600 mg daily if your appetite doesn't get any better to make sure you have enough calcium for your bones  Pick up a new blood pressure cuff  Use a dropper or use a cap to put peroxide, olive oil,mineral oil or canola oil in the effected ear- 2-3 times a week. Let it soak for 20-30 min then you can take a shower or use a baby bulb with warm water to wash out the ear wax.  Do not use Qtips   Schedule appointment with Dr. Lucia Gaskins   Osteopenia  Osteopenia is a loss of thickness (density) inside of the bones. Another name for osteopenia is low bone mass. Mild osteopenia is a normal part of aging. It is not a disease, and it does not cause symptoms. However, if you have osteopenia and continue to lose bone mass, you could develop a condition that causes the bones to become thin and break more easily (osteoporosis). You may also lose some height, have back pain, and have a stooped posture. Although osteopenia is not a disease, making changes to your lifestyle and diet can help to prevent osteopenia from developing into osteoporosis. What are the causes? Osteopenia is caused by loss of calcium in the bones.  Bones are constantly changing. Old bone cells are continually being replaced with new bone cells. This process builds new bone. The mineral calcium is needed to build new bone and maintain bone density. Bone density is usually highest around age 61. After that, most people's bodies cannot replace all the bone they have lost with new bone. What increases the risk? You are more likely to develop this condition if:  You are older than age 18.  You are a woman who went through menopause early.  You have a long illness that keeps you in bed.  You do not get enough exercise.  You lack certain nutrients (malnutrition).  You have an overactive thyroid gland (hyperthyroidism).  You smoke.  You drink a lot of alcohol.  You are taking medicines that weaken the bones,  such as steroids. What are the signs or symptoms? This condition does not cause any symptoms. You may have a slightly higher risk for bone breaks (fractures), so getting fractures more easily than normal may be an indication of osteopenia. How is this diagnosed? Your health care provider can diagnose this condition with a special type of X-ray exam that measures bone density (dual-energy X-ray absorptiometry, DEXA). This test can measure bone density in your hips, spine, and wrists. Osteopenia has no symptoms, so this condition is usually diagnosed after a routine bone density screening test is done for osteoporosis. This routine screening is usually done for:  Women who are age 28 or older.  Men who are age 60 or older. If you have risk factors for osteopenia, you may have the screening test at an earlier age. How is this treated? Making dietary and lifestyle changes can lower your risk for osteoporosis. If you have severe osteopenia that is close to becoming osteoporosis, your health care provider may prescribe medicines and dietary supplements such as calcium and vitamin D. These supplements help to rebuild bone density. Follow these instructions at home:   Take over-the-counter and prescription medicines only as told by your health care provider. These include vitamins and supplements.  Eat a diet that is high in calcium and vitamin D. ? Calcium is found in dairy products, beans, salmon, and leafy green vegetables like spinach and broccoli. ? Look for  foods that have vitamin D and calcium added to them (fortified foods), such as orange juice, cereal, and bread.  Do 30 or more minutes of a weight-bearing exercise every day, such as walking, jogging, or playing a sport. These types of exercises strengthen the bones.  Take precautions at home to lower your risk of falling, such as: ? Keeping rooms well-lit and free of clutter, such as cords. ? Installing safety rails on stairs. ? Using  rubber mats in the bathroom or other areas that are often wet or slippery.  Do not use any products that contain nicotine or tobacco, such as cigarettes and e-cigarettes. If you need help quitting, ask your health care provider.  Avoid alcohol or limit alcohol intake to no more than 1 drink a day for nonpregnant women and 2 drinks a day for men. One drink equals 12 oz of beer, 5 oz of wine, or 1 oz of hard liquor.  Keep all follow-up visits as told by your health care provider. This is important. Contact a health care provider if:  You have not had a bone density screening for osteoporosis and you are: ? A woman, age 42 or older. ? A man, age 58 or older.  You are a postmenopausal woman who has not had a bone density screening for osteoporosis.  You are older than age 24 and you want to know if you should have bone density screening for osteoporosis. Summary  Osteopenia is a loss of thickness (density) inside of the bones. Another name for osteopenia is low bone mass.  Osteopenia is not a disease, but it may increase your risk for a condition that causes the bones to become thin and break more easily (osteoporosis).  You may be at risk for osteopenia if you are older than age 34 or if you are a woman who went through early menopause.  Osteopenia does not cause any symptoms, but it can be diagnosed with a bone density screening test.  Dietary and lifestyle changes are the first treatment for osteopenia. These may lower your risk for osteoporosis. This information is not intended to replace advice given to you by your health care provider. Make sure you discuss any questions you have with your health care provider. Document Released: 08/16/2017 Document Revised: 08/16/2017 Document Reviewed: 08/16/2017 Elsevier Interactive Patient Education  2019 [employer].

## 2018-12-06 ENCOUNTER — Other Ambulatory Visit: Payer: Self-pay | Admitting: Adult Health

## 2018-12-06 LAB — COMPLETE METABOLIC PANEL WITH GFR
AG Ratio: 1.8 (calc) (ref 1.0–2.5)
ALT: 13 U/L (ref 6–29)
AST: 22 U/L (ref 10–35)
Albumin: 3.9 g/dL (ref 3.6–5.1)
Alkaline phosphatase (APISO): 86 U/L (ref 33–130)
BILIRUBIN TOTAL: 0.5 mg/dL (ref 0.2–1.2)
BUN: 13 mg/dL (ref 7–25)
CHLORIDE: 103 mmol/L (ref 98–110)
CO2: 32 mmol/L (ref 20–32)
Calcium: 9.6 mg/dL (ref 8.6–10.4)
Creat: 0.85 mg/dL (ref 0.60–0.93)
GFR, Est African American: 76 mL/min/{1.73_m2} (ref >=60)
GFR, Est Non African American: 65 mL/min/{1.73_m2} (ref >=60)
GLUCOSE: 89 mg/dL (ref 65–99)
Globulin: 2.2 g/dL (calc) (ref 1.9–3.7)
Potassium: 3.7 mmol/L (ref 3.5–5.3)
Sodium: 142 mmol/L (ref 135–146)
Total Protein: 6.1 g/dL (ref 6.1–8.1)

## 2018-12-06 LAB — CBC WITH DIFFERENTIAL/PLATELET
Absolute Monocytes: 556 cells/uL (ref 200–950)
Basophils Absolute: 20 cells/uL (ref 0–200)
Basophils Relative: 0.4 %
EOS ABS: 92 {cells}/uL (ref 15–500)
Eosinophils Relative: 1.8 %
HCT: 35.3 % (ref 35.0–45.0)
Hemoglobin: 11.8 g/dL (ref 11.7–15.5)
Lymphs Abs: 1647 cells/uL (ref 850–3900)
MCH: 31.7 pg (ref 27.0–33.0)
MCHC: 33.4 g/dL (ref 32.0–36.0)
MCV: 94.9 fL (ref 80.0–100.0)
MONOS PCT: 10.9 %
MPV: 10.8 fL (ref 7.5–12.5)
Neutro Abs: 2785 cells/uL (ref 1500–7800)
Neutrophils Relative %: 54.6 %
PLATELETS: 227 10*3/uL (ref 140–400)
RBC: 3.72 10*6/uL — ABNORMAL LOW (ref 3.80–5.10)
RDW: 15.2 % — ABNORMAL HIGH (ref 11.0–15.0)
TOTAL LYMPHOCYTE: 32.3 %
WBC: 5.1 10*3/uL (ref 3.8–10.8)

## 2018-12-06 LAB — LIPID PANEL
CHOLESTEROL: 163 mg/dL (ref <200)
HDL: 52 mg/dL (ref >50)
LDL CHOLESTEROL (CALC): 88 mg/dL
Non-HDL Cholesterol (Calc): 111 mg/dL (calc) (ref <130)
Total CHOL/HDL Ratio: 3.1 (calc) (ref <5.0)
Triglycerides: 134 mg/dL (ref <150)

## 2018-12-06 LAB — VITAMIN D 25 HYDROXY (VIT D DEFICIENCY, FRACTURES): VIT D 25 HYDROXY: 69 ng/mL (ref 30–100)

## 2018-12-06 LAB — TSH: TSH: 0.22 mIU/L — ABNORMAL LOW (ref 0.40–4.50)

## 2018-12-06 MED ORDER — LEVOTHYROXINE SODIUM 100 MCG PO TABS: ORAL_TABLET | ORAL | 1 refills | Status: DC

## 2018-12-07 ENCOUNTER — Other Ambulatory Visit: Payer: Self-pay | Admitting: Adult Health

## 2018-12-11 DIAGNOSIS — R1314 Dysphagia, pharyngoesophageal phase: Secondary | ICD-10-CM | POA: Diagnosis not present

## 2018-12-11 DIAGNOSIS — H6123 Impacted cerumen, bilateral: Secondary | ICD-10-CM | POA: Diagnosis not present

## 2018-12-14 ENCOUNTER — Other Ambulatory Visit: Payer: Self-pay | Admitting: Otolaryngology

## 2018-12-14 ENCOUNTER — Other Ambulatory Visit (HOSPITAL_COMMUNITY): Payer: Self-pay | Admitting: Otolaryngology

## 2018-12-14 DIAGNOSIS — R1314 Dysphagia, pharyngoesophageal phase: Secondary | ICD-10-CM

## 2018-12-19 ENCOUNTER — Other Ambulatory Visit: Payer: Self-pay | Admitting: Internal Medicine

## 2018-12-21 ENCOUNTER — Ambulatory Visit (HOSPITAL_COMMUNITY)
Admission: RE | Admit: 2018-12-21 | Discharge: 2018-12-21 | Disposition: A | Discharge status: Home / Self Care | Payer: Medicare Other | Source: Ambulatory Visit | Attending: Otolaryngology | Admitting: Otolaryngology

## 2018-12-21 ENCOUNTER — Other Ambulatory Visit (HOSPITAL_COMMUNITY): Payer: Self-pay | Admitting: Otolaryngology

## 2018-12-21 DIAGNOSIS — R1314 Dysphagia, pharyngoesophageal phase: Secondary | ICD-10-CM

## 2018-12-26 ENCOUNTER — Emergency Department (HOSPITAL_COMMUNITY)
Admission: EM | Admit: 2018-12-26 | Discharge: 2018-12-26 | Disposition: A | Discharge status: Home / Self Care | Payer: Medicare Other | Attending: Emergency Medicine | Admitting: Emergency Medicine

## 2018-12-26 ENCOUNTER — Encounter (HOSPITAL_COMMUNITY): Payer: Self-pay | Admitting: Emergency Medicine

## 2018-12-26 ENCOUNTER — Emergency Department (HOSPITAL_COMMUNITY): Payer: Medicare Other

## 2018-12-26 DIAGNOSIS — E039 Hypothyroidism, unspecified: Secondary | ICD-10-CM | POA: Diagnosis not present

## 2018-12-26 DIAGNOSIS — J069 Acute upper respiratory infection, unspecified: Secondary | ICD-10-CM | POA: Diagnosis not present

## 2018-12-26 DIAGNOSIS — I10 Essential (primary) hypertension: Secondary | ICD-10-CM | POA: Diagnosis not present

## 2018-12-26 DIAGNOSIS — Z87891 Personal history of nicotine dependence: Secondary | ICD-10-CM | POA: Insufficient documentation

## 2018-12-26 DIAGNOSIS — R251 Tremor, unspecified: Secondary | ICD-10-CM | POA: Diagnosis not present

## 2018-12-26 DIAGNOSIS — Z79899 Other long term (current) drug therapy: Secondary | ICD-10-CM | POA: Insufficient documentation

## 2018-12-26 DIAGNOSIS — B9789 Other viral agents as the cause of diseases classified elsewhere: Secondary | ICD-10-CM

## 2018-12-26 DIAGNOSIS — R05 Cough: Secondary | ICD-10-CM | POA: Diagnosis not present

## 2018-12-26 LAB — INFLUENZA PANEL BY PCR (TYPE A & B)
INFLBPCR: NEGATIVE
Influenza A By PCR: NEGATIVE

## 2018-12-26 MED ORDER — ACETAMINOPHEN 650 MG RE SUPP
650.0000 mg | Freq: Once | RECTAL | Status: AC
  Administered 2018-12-26: 650 mg via RECTAL
  Filled 2018-12-26: qty 1

## 2018-12-26 NOTE — ED Notes (Signed)
ED Provider at bedside. 

## 2018-12-26 NOTE — Discharge Instructions (Signed)
Take Tylenol as needed for fevers.  Follow-up with your primary care provider in 2 days for continued evaluation.  Return to the ED immediately for new or worsening symptoms or concerns, such as chest pain, shortness of breath, vomiting, abdominal pain or any concerns at all.

## 2018-12-26 NOTE — ED Triage Notes (Signed)
Pt states she was sitting on couch watching TV when tremors began. States she has a history of tremors but this episode was worse than usual.

## 2018-12-26 NOTE — ED Notes (Signed)
Patient transported to XRAY 

## 2018-12-26 NOTE — ED Notes (Signed)
Pt also c/o dizziness that started today at 1700. Dizziness is intermittent, ambulatory from wheelchair to stretcher without difficulty.

## 2018-12-26 NOTE — ED Provider Notes (Signed)
[name] Provider Note   CSN: [id number] Arrival date & time: 12/26/18  2023     History   Chief Complaint Chief Complaint  Patient presents with  . Tremors    HPI Dana [name] is a 78 y.o. female.  HPI  78 year old female, with a PMH of essential tremor, presents for an episode of increased shaking.  Patient states she has had episodes like this in the past but usually they do not last as long.  She described as increased shaking of her upper extremities and her head.  She took a Valium which she has as needed for her tremors.  Symptoms lasted approximately 1 hour and then resolved.  She also notes a mild cough and rhinorrhea for the last day.  She denies any sputum production, chest pain, shortness of breath, sore throat.  Patient denies any known fevers.  Past Medical History:  Diagnosis Date  . Anemia   . Cataract   . Environmental allergies   . Gastric ulcer   . Heartburn   . Hiatal hernia   . Hyperlipidemia   . Hypertension   . Hypothyroidism   . Migraines   . Prediabetes   . Rash october 2012   all over abdomen and breasts  . Rheumatoid arthritis(714.0)   . Tremor, essential    right hand    Patient Active Problem List   Diagnosis Date Noted  . Osteopenia 12/05/2018  . FHx: heart disease 09/03/2018  . Former smoker 09/03/2018  . Overweight (BMI 25.0-29.9) 05/30/2018  . Generalized OA 09/02/2015  . Other long term (current) drug therapy 09/02/2015  . Vitamin D deficiency 01/06/2014  . Medication management 01/06/2014  . Hypertension   . Other abnormal glucose   . Rheumatoid arthritis ([place])   . Hereditary essential tremor   . Hypothyroidism   . Migraines   . Hyperlipidemia   . Anemia     Past Surgical History:  Procedure Laterality Date  . ABDOMINAL HYSTERECTOMY    . CATARACT EXTRACTION, BILATERAL    . CHOLECYSTECTOMY  08/31/11   lap chole   . COLONOSCOPY    . TUBAL LIGATION       OB History    No obstetric history on file.      Home Medications    Prior to Admission medications   Medication Sig Start Date End Date Taking? Authorizing Provider  Cholecalciferol (VITAMIN D) 2000 UNITS CAPS Take by mouth. Take 5 capsules by mouth once daily    [provider]  Cyanocobalamin 500 MCG/0.1ML SOLN Place 1 mL under the tongue daily.     [provider]  diazepam (VALIUM) 5 MG tablet Take 1 tablet 3 x /day for Tremor 12/19/18   Unk Pinto, MD  folic acid (FOLVITE) 144 MCG tablet Take 800 mcg by mouth daily.     [provider]  furosemide (LASIX) 40 MG tablet TAKE 1 TABLET BY MOUTH 2  TIMES DAILY FOR FLUID  RETENTION/ANKLE SWELLING 05/18/18   Unk Pinto, MD  hydroxychloroquine (PLAQUENIL) 200 MG tablet TAKE 1 TABLET BY MOUTH  TWICE A DAY 03/22/17   [provider]  levothyroxine (SYNTHROID, LEVOTHROID) 100 MCG tablet TAKE 1 TABLET BY MOUTH  DAILY 12/07/18   Liane Comber, NP  methotrexate (RHEUMATREX) 2.5 MG tablet Take 5 tablets on Tues and 5 tablets on Wed 02/10/17   [provider]  montelukast (SINGULAIR) 10 MG tablet TAKE 1 TABLET BY MOUTH  DAILY 08/13/18  Vicie Mutters, PA-C  ondansetron [facility] ODT) 8 MG disintegrating tablet 8mg  ODT q4 hours prn nausea 04/19/16   Rolene Course, PA-C  pravastatin (PRAVACHOL) 40 MG tablet Take 1 tablet (40 mg total) by mouth daily. 01/15/18   Unk Pinto, MD  propranolol (INDERAL) 80 MG tablet TAKE 1 TABLET BY MOUTH 3  TIMES DAILY 08/13/18   Vicie Mutters, PA-C  ranitidine (ZANTAC) 300 MG tablet TAKE 1 TABLET BY MOUTH AT  BEDTIME 05/18/18   Liane Comber, NP  traMADol (ULTRAM) 50 MG tablet Take 1 tablet (50 mg total) by mouth 4 (four) times daily. Patient taking differently: Take 50 mg by mouth as needed.  05/30/16   Unk Pinto, MD    Family History Family History  Problem Relation Age of Onset  . Heart disease Mother   . Diabetes Mother   . Heart disease Father   . Heart  disease Maternal Aunt        x3   . Heart disease Maternal Uncle        x 4  . Diabetes Sister        borderline  . Heart attack Son 19       Sudden cardiac death  . Colon cancer Neg Hx     Social History Social History   Tobacco Use  . Smoking status: Former Smoker    Types: Cigarettes    Last attempt to quit: 09/18/1984    Years since quitting: 34.2  . Smokeless tobacco: Never Used  . Tobacco comment: stopped around 25 years ago  Substance Use Topics  . Alcohol use: No  . Drug use: No     Allergies   Codeine and Mysoline [primidone]   Review of Systems Review of Systems  Constitutional: Negative for chills and fever.  HENT: Positive for rhinorrhea. Negative for sore throat.   Eyes: Negative for visual disturbance.  Respiratory: Positive for cough. Negative for shortness of breath.   Cardiovascular: Negative for chest pain and leg swelling.  Gastrointestinal: Negative for abdominal pain, diarrhea, nausea and vomiting.  Genitourinary: Negative for dysuria, frequency and urgency.  Musculoskeletal: Negative for joint swelling and neck pain.  Skin: Negative for rash and wound.  Neurological: Positive for tremors. Negative for syncope and numbness.  All other systems reviewed and are negative.    Physical Exam Updated Vital Signs BP 114/81 (BP Location: Right Arm)   Pulse 76   Temp (!) 101 F (38.3 C) (Oral)   Resp 18   SpO2 100%   Physical Exam Vitals signs and nursing note reviewed.  Constitutional:      Appearance: She is well-developed.  HENT:     Head: Normocephalic and atraumatic.     Right Ear: Tympanic membrane normal.     Left Ear: Tympanic membrane normal.     Nose: Nose normal.     Mouth/Throat:     Mouth: Mucous membranes are moist.     Pharynx: Oropharynx is clear. Uvula midline.  Eyes:     Conjunctiva/sclera: Conjunctivae normal.  Neck:     Musculoskeletal: Neck supple.  Cardiovascular:     Rate and Rhythm: Normal rate and regular  rhythm.     Heart sounds: Normal heart sounds. No murmur.  Pulmonary:     Effort: Pulmonary effort is normal. No respiratory distress.     Breath sounds: Normal breath sounds. No wheezing or rales.  Abdominal:     General: Bowel sounds are normal. There is no distension.  Palpations: Abdomen is soft.     Tenderness: There is no abdominal tenderness.  Musculoskeletal: Normal range of motion.        General: No tenderness or deformity.  Skin:    General: Skin is warm and dry.     Findings: No erythema or rash.  Neurological:     Mental Status: She is alert and oriented to person, place, and time.     Motor: Tremor present.  Psychiatric:        Behavior: Behavior normal.      ED Treatments / Results  Labs (all labs ordered are listed, but only abnormal results are displayed) Labs Reviewed  INFLUENZA PANEL BY PCR (TYPE A & B)    EKG None  Radiology Dg Chest 2 View  Result Date: 12/26/2018 CLINICAL DATA:  Worsening tremors tonight. Chronic cough. History of rheumatoid arthritis. EXAM: CHEST - 2 VIEW COMPARISON:  Chest radiograph March 10, 2016 FINDINGS: Cardiac silhouette is mildly enlarged and unchanged. Calcified aortic arch. Mild chronic interstitial changes out pleural effusion or focal consolidation. No pneumothorax. Osteopenia. IMPRESSION: 1. Mild cardiomegaly.  Chronic interstitial changes. 2.  Aortic Atherosclerosis (ICD10-I70.0). Electronically Signed   By: Elon Alas M.D.   On: 12/26/2018 21:38    Procedures Procedures (including critical care time)  Medications Ordered in ED Medications  acetaminophen (TYLENOL) suppository 650 mg (650 mg Rectal Given 12/26/18 2106)     Initial Impression / Assessment and Plan / ED Course  I have reviewed the triage vital signs and the nursing notes.  Pertinent labs & imaging results that were available during my care of the patient were reviewed by me and considered in my medical decision making (see chart for  details).     Patient presented with acute on chronic tremors.  She took Valium prior to arrival and symptoms resolved.  She has a mild tremor noted of her right upper extremity.  Patient was noted to be febrile.  She did note rhinorrhea and a cough.  Flu swab and chest x-ray are negative.  Patient is very well-appearing, no acute distress, nontoxic, non-lethargic.  She was given Tylenol for her fever.  She is feeling well.  History and physical most consistent with a viral URI.  Encourage close follow-up with primary care.  Encourage symptom management.  Patient agreeable with this plan.  She is ready and stable for discharge.   At this time there does not appear to be any evidence of an acute emergency medical condition and the patient appears stable for discharge with appropriate outpatient follow up.Diagnosis was discussed with patient who verbalizes understanding and is agreeable to discharge. Pt case discussed with Dr. Alvino Chapel who agrees with my plan.   Final Clinical Impressions(s) / ED Diagnoses   Final diagnoses:  None    ED Discharge Orders    None       Rachel Moulds 12/27/18 0026    Davonna Belling, MD 12/27/18 [phone]

## 2018-12-27 NOTE — Progress Notes (Signed)
FOLLOW UP  Assessment and Plan:   Dana [name] was seen today for follow-up after ER visit for increased tremors episode, no admission.  Diagnoses and all orders for this visit:  Tremor, essential Taking propanolol 80mg  TID Takes Valium 5mg  at night. Recommend taking half tablet during the day to see if this helps with symptoms and not cause sleepiness. Increased tremor cause etiology unknown. Related to febrile status from URI? No further instances, continue to monitor.  Viral URI with cough Doing well with this Symptoms resolving  Non-seasonal allergic rhinitis, unspecified trigger Discussed adding antihistamine Allegra or Xyzal to her singular Discussed importance of managing allergies to prevent buildup leading to dizziness, infections.  Hypothyroidism, unspecified type Doing well on current regiment, leveothyroxine 145mcg daily. Recently increased, will need recheck in 2-3 weeks. reminded to take on an empty stomach 30-47mins before food.   Hyperlipidemia, mixed Currently at goal; continue statin Continue low cholesterol diet and exercise.  Check lipid panel.   Lower extremity edema Did not take dose this am related to travel to office appointment Reports well managed. Reports not taking second dose at times. Monitor B/P does not have machine, then can try cutting back to once daily, monitor edema, take second dose only if edema resumes.  Reviewed discharge summary, labs and imaging from visit. No follow up labs/imaging necessary at this time.  Discussed hospital precautions with patient and agrees with plan of care.  Call or return with new or worsening symptoms as discussed in appointment.  May contact via office phone [phone] or [place].   Continue diet and meds as discussed. Further disposition pending results of labs. Discussed med's effects and SE's.   Over 30 minutes of exam, counseling, chart review, and critical decision making was performed.   Future  Appointments  Date Time Provider [place]  01/22/2019 10:45 AM Josue Hector, MD [facility]  03/11/2019  2:00 PM Unk Pinto, MD [facility] None    ----------------------------------------------------------------------------------------------------------------------  HPI 78 y.o. female  presents for follow up after ER visit for increased shaking of upper extremities and head.  She said she was at home sitting  without admission. She has HTN, HLD, Hypothyroidism, pre-diabetes and vitamin D deficiency.  Hereditary RA (2001) stable on Azulfadine & MTX and follows with [facility] and essential tremor, taking propanolol. She also takes Valium at bedtime to help with the tremors.  She presents today reporting recovering from URI last week; had sore throat and "couldn't talk for a week" but started feeling better 4-5 days ago and reports mostly back to baseline at this point. She also reports her last visit here she had cerumen impaction in her left ear and had ear lavage completed.  She had some bleeding from this ear in the office and it continue to bleed through the night.  It was resolved the next day.   Last week she had a swallow evaluation related to pills getting stuck.  She also reports some coughing after eating or drinking.  She is supposed to have a follow up to discuss these results but have not as of yet.  ESOPHOGRAM/BARIUM SWALLOW IMPRESSION: 1. Hypopharyngeal dysmotility, as described above. 2. Esophageal dysmotility, as described above. 3. No strictures or masses.   During hospital visit she was swabbed for influenza A & B, both resulted negative.  She also has a chest X-ray: Dg Chest 2 View  Result Date: 12/26/2018 CLINICAL DATA:  Worsening tremors tonight. Chronic cough. History of rheumatoid arthritis. EXAM: CHEST - 2 VIEW  COMPARISON:  Chest radiograph March 10, 2016 FINDINGS: Cardiac silhouette is mildly enlarged and unchanged. Calcified aortic arch. Mild  chronic interstitial changes out pleural effusion or focal consolidation. No pneumothorax. Osteopenia. IMPRESSION: 1. Mild cardiomegaly.  Chronic interstitial changes. 2.  Aortic Atherosclerosis (ICD10-I70.0). Electronically Signed   By: Elon Alas M.D.   On: 12/26/2018 21:38  BMI is Body mass index is 27.67 kg/m., she admits she has not been working on diet or exercise. Reports food hasn't been very appetizing and not eating as much.  Wt Readings from Last 3 Encounters:  12/28/18 156 lb 3.2 oz (70.9 kg)  12/05/18 155 lb (70.3 kg)  09/03/18 162 lb 9.6 oz (73.8 kg)   Diagnosed in 1998 with HTN.  Her BP cuff is broken and hasn't been checking BP at home, today their BP is BP: 130/74.  She does not workout. She endorses today that she has had intermittent L sided chest pain, she does associate this with exertion (climbing up stairs) and shortness of breath. She reports she saw a cardiologist in 2015 or so prior to gallbladder surgery and had stress test but cannot locate today in EMR or in paper chart. Does appear she saw Dr. Cristopher Peru in ? 2010 for holter monitoring.  Her mother had CHF.   She is on cholesterol medication (pravastatin 40 mg daily) and denies myalgias. Her cholesterol is at goal. The cholesterol last visit was:   Lab Results  Component Value Date   CHOL 163 12/05/2018   HDL 52 12/05/2018   LDLCALC 88 12/05/2018   TRIG 134 12/05/2018   CHOLHDL 3.1 12/05/2018    She has been working on diet and exercise for glucose management, and denies foot ulcerations, increased appetite, nausea, paresthesia of the feet, polydipsia, polyuria, visual disturbances, vomiting and weight loss. Last A1C in the office was:  Lab Results  Component Value Date   HGBA1C 5.3 09/03/2018   She is on thyroid medication since 1994. Her medication was changed last visit to levothyroxine 168mcg. Lab Results  Component Value Date   TSH 0.22 (L) 12/05/2018   Patient is on Vitamin D supplement.   First diagnoses (17) in 2009.  September 2009 level 89 at [facility] Lab Results  Component Value Date   VD25OH 69 12/05/2018       Lab Results  Component Value Date   GFRNONAA 65 12/05/2018        Current Medications:  Current Outpatient Medications on File Prior to Visit  Medication Sig  . Cholecalciferol (VITAMIN D) 2000 UNITS CAPS Take by mouth. Take 5 capsules by mouth once daily  . Cyanocobalamin 500 MCG/0.1ML SOLN Place 1 mL under the tongue daily.   . diazepam (VALIUM) 5 MG tablet Take 1 tablet 3 x /day for Tremor  . folic acid (FOLVITE) 294 MCG tablet Take 800 mcg by mouth daily.   . furosemide (LASIX) 40 MG tablet TAKE 1 TABLET BY MOUTH 2  TIMES DAILY FOR FLUID  RETENTION/ANKLE SWELLING  . hydroxychloroquine (PLAQUENIL) 200 MG tablet TAKE 1 TABLET BY MOUTH  TWICE A DAY  . levothyroxine (SYNTHROID, LEVOTHROID) 100 MCG tablet TAKE 1 TABLET BY MOUTH  DAILY  . methotrexate (RHEUMATREX) 2.5 MG tablet Take 5 tablets on Tues and 5 tablets on Wed  . montelukast (SINGULAIR) 10 MG tablet TAKE 1 TABLET BY MOUTH  DAILY  . ondansetron (ZOFRAN ODT) 8 MG disintegrating tablet 8mg  ODT q4 hours prn nausea  . pravastatin (PRAVACHOL) 40 MG tablet Take 1 tablet (  40 mg total) by mouth daily.  . propranolol (INDERAL) 80 MG tablet TAKE 1 TABLET BY MOUTH 3  TIMES DAILY  . ranitidine (ZANTAC) 300 MG tablet TAKE 1 TABLET BY MOUTH AT  BEDTIME  . traMADol (ULTRAM) 50 MG tablet Take 1 tablet (50 mg total) by mouth 4 (four) times daily. (Patient taking differently: Take 50 mg by mouth as needed. )   No current facility-administered medications on file prior to visit.      Allergies:  Allergies  Allergen Reactions  . Codeine Nausea Only  . Mysoline [Primidone] Nausea Only and Other (See Comments)    Over-sedated     Medical History:  Past Medical History:  Diagnosis Date  . Anemia   . Cataract   . Environmental allergies   . Gastric ulcer   . Heartburn   . Hiatal hernia   .  Hyperlipidemia   . Hypertension   . Hypothyroidism   . Migraines   . Prediabetes   . Rash october 2012   all over abdomen and breasts  . Rheumatoid arthritis(714.0)   . Tremor, essential    right hand   Family history- Reviewed and unchanged Social history- Reviewed and unchanged   Review of Systems:    Physical Exam: BP 130/74   Pulse 96   Temp (!) 97.3 F (36.3 C)   Ht 5\' 3"  (1.6 m)   Wt 156 lb 3.2 oz (70.9 kg)   SpO2 96%   BMI 27.67 kg/m  Wt Readings from Last 3 Encounters:  12/28/18 156 lb 3.2 oz (70.9 kg)  12/05/18 155 lb (70.3 kg)  09/03/18 162 lb 9.6 oz (73.8 kg)   General Appearance: Well nourished, in no apparent distress. Eyes: PERRLA, EOMs, conjunctiva no swelling or erythema Sinuses: No Frontal/maxillary tenderness ENT/Mouth: L ext aud canal obstructed by wax, R Ext aud canal clear, R TM without erythema, bulging. Posterior pharynx mildly erythematous, without swelling, or exudate on post pharynx.  Tonsils not swollen or notably erythematous, 2 tonsil stones present to right.  Hearing normal.  Neck: Supple, thyroid normal.  Respiratory: Respiratory effort normal, BS equal bilaterally without rales, rhonchi, wheezing or stridor.  Cardio: RRR with no MRGs. Brisk peripheral pulses without edema.  Abdomen: Soft, + BS.  Non tender, no guarding, rebound, hernias, masses. Lymphatics: Non tender without lymphadenopathy.  Musculoskeletal: Full ROM, 5/5 strength, slow steady gait.  Skin: Warm, dry without rashes, lesions, ecchymosis.  Neuro: Cranial nerves intact. No cerebellar symptoms. She has subtle head and vocal tremor.  Psych: Awake and oriented X 3, normal affect, Insight and Judgment appropriate.      Dana Sierras, NP 11:59 AM [facility]         MEDICARE ANNUAL WELLNESS VISIT AND FOLLOW UP  Assessment:    Over 40 minutes of exam, counseling, chart review and critical decision making was  performed Future Appointments  Date Time Provider [place]  01/22/2019 10:45 AM Josue Hector, MD [facility]  03/11/2019  2:00 PM Unk Pinto, MD [facility] None     Plan:   During the course of the visit the patient was educated and counseled about appropriate screening and preventive services including:    Pneumococcal vaccine   Prevnar 13  Influenza vaccine  Td vaccine  Screening electrocardiogram  Bone densitometry screening  Colorectal cancer screening  Diabetes screening  Glaucoma screening  Nutrition counseling   Advanced directives: requested   Subjective:  Dana [name] is a 78  y.o. female who presents for Medicare Annual Wellness Visit and 3 month follow up.   She has had elevated blood pressure. Her blood pressure has been controlled at home, today their BP is BP: 130/74 She does not workout. She denies chest pain, shortness of breath, dizziness.  She is on cholesterol medication and denies myalgias. Her cholesterol is at goal. The cholesterol last visit was:   Lab Results  Component Value Date   CHOL 163 12/05/2018   HDL 52 12/05/2018   LDLCALC 88 12/05/2018   TRIG 134 12/05/2018   CHOLHDL 3.1 12/05/2018   She has had prediabetes She  Has not been working on diet and exercise for prediabetes, and denies hyperglycemia, hypoglycemia , polydipsia and polyuria. Last A1C in the office was:  Lab Results  Component Value Date   HGBA1C 5.3 09/03/2018   Last GFR:   Lab Results  Component Value Date   GFRNONAA 65 12/05/2018   Lab Results  Component Value Date   GFRAA 76 12/05/2018   Patient is on Vitamin D supplement.   Lab Results  Component Value Date   VD25OH 69 12/05/2018      Medication Review: Current Outpatient Medications on File Prior to Visit  Medication Sig Dispense Refill  . Cholecalciferol (VITAMIN D) 2000 UNITS CAPS Take by mouth. Take 5 capsules by mouth once daily    . Cyanocobalamin 500  MCG/0.1ML SOLN Place 1 mL under the tongue daily.     . diazepam (VALIUM) 5 MG tablet Take 1 tablet 3 x /day for Tremor 720 tablet 1  . folic acid (FOLVITE) 947 MCG tablet Take 800 mcg by mouth daily.     . furosemide (LASIX) 40 MG tablet TAKE 1 TABLET BY MOUTH 2  TIMES DAILY FOR FLUID  RETENTION/ANKLE SWELLING 180 tablet 1  . hydroxychloroquine (PLAQUENIL) 200 MG tablet TAKE 1 TABLET BY MOUTH  TWICE A DAY    . levothyroxine (SYNTHROID, LEVOTHROID) 100 MCG tablet TAKE 1 TABLET BY MOUTH  DAILY 90 tablet 1  . methotrexate (RHEUMATREX) 2.5 MG tablet Take 5 tablets on Tues and 5 tablets on Wed    . montelukast (SINGULAIR) 10 MG tablet TAKE 1 TABLET BY MOUTH  DAILY 90 tablet 1  . ondansetron (ZOFRAN ODT) 8 MG disintegrating tablet 8mg  ODT q4 hours prn nausea 30 tablet 3  . pravastatin (PRAVACHOL) 40 MG tablet Take 1 tablet (40 mg total) by mouth daily. 90 tablet 3  . propranolol (INDERAL) 80 MG tablet TAKE 1 TABLET BY MOUTH 3  TIMES DAILY 270 tablet 1  . ranitidine (ZANTAC) 300 MG tablet TAKE 1 TABLET BY MOUTH AT  BEDTIME 90 tablet 1  . traMADol (ULTRAM) 50 MG tablet Take 1 tablet (50 mg total) by mouth 4 (four) times daily. (Patient taking differently: Take 50 mg by mouth as needed. ) 360 tablet 1   No current facility-administered medications on file prior to visit.     Allergies  Allergen Reactions  . Codeine Nausea Only  . Mysoline [Primidone] Nausea Only and Other (See Comments)    Over-sedated    Current Problems (verified) Patient Active Problem List   Diagnosis Date Noted  . Osteopenia 12/05/2018  . FHx: heart disease 09/03/2018  . Former smoker 09/03/2018  . Overweight (BMI 25.0-29.9) 05/30/2018  . Generalized OA 09/02/2015  . Other long term (current) drug therapy 09/02/2015  . Vitamin D deficiency 01/06/2014  . Medication management 01/06/2014  . Hypertension   . Other abnormal glucose   .  Rheumatoid arthritis ([place])   . Hereditary essential tremor   . Hypothyroidism   .  Migraines   . Hyperlipidemia   . Anemia     Screening Tests Immunization History  Administered Date(s) Administered  . DT 02/05/2015  . Influenza Split 09/12/2013  . Influenza, High Dose Seasonal PF 10/28/2014, 08/18/2016, 11/22/2018  . Pneumococcal Conjugate-13 05/12/2015  . Pneumococcal-Unspecified 11/21/2006  . Td 11/22/2003  . Varicella 06/23/2011  . Zoster 06/23/2011    Preventative care: Last colonoscopy: 2013 Last mammogram: 2019, due for 2020 Last pap smear/pelvic exam: N/A  DEXA:2019  Prior vaccinations: TD or Tdap: 2016  Influenza: 2019 Pneumococcal: 2008 Prevnar13: 2016 Shingles/Zostavax: 2012  Names of Other Physician/Practitioners you currently use: 1. [facility] here for primary care 2. Eye exam due for 2020, last 2019 3. Dentist complete for 2019, due for 2020  .Patient Care Team: Unk Pinto, MD as PCP - General (Internal Medicine) Roetta Sessions, MD as Referring Physician (Internal Medicine)  SURGICAL HISTORY She  has a past surgical history that includes Abdominal hysterectomy; Tubal ligation; Cataract extraction, bilateral; Cholecystectomy (08/31/11); and Colonoscopy. FAMILY HISTORY Her family history includes Diabetes in her mother and sister; Heart attack (age of onset: 66) in her son; Heart disease in her father, maternal aunt, maternal uncle, and mother. SOCIAL HISTORY She  reports that she quit smoking about 34 years ago. Her smoking use included cigarettes. She has never used smokeless tobacco. She reports that she does not drink alcohol or use drugs.   MEDICARE WELLNESS OBJECTIVES: Physical activity: Current Exercise Habits: The patient does not participate in regular exercise at present(She likes to be outside and does some walking.), Exercise limited by: neurologic condition(s) Cardiac risk factors: Cardiac Risk Factors include: advanced age (>34men, >71  women);dyslipidemia Depression/mood screen:   Depression screen [facility] 2/9 12/28/2018  Decreased Interest 0  Down, Depressed, Hopeless 0  PHQ - 2 Score 0    ADLs:  In your present state of health, do you have any difficulty performing the following activities: 12/28/2018 09/03/2018  Hearing? N N  Vision? N N  Difficulty concentrating or making decisions? N N  Walking or climbing stairs? N N  Dressing or bathing? N N  Doing errands, shopping? N N  Comment But has a daughter who helps her at times -  Conservation officer, nature and eating ? N -  Using the Toilet? N -  In the past six months, have you accidently leaked urine? N -  Do you have problems with loss of bowel control? N -  Managing your Medications? N -  Managing your Finances? N -  Housekeeping or managing your Housekeeping? N -  Some recent data might be hidden     Cognitive Testing  Alert? Yes  Normal Appearance?Yes  Oriented to person? Yes  Place? Yes   Time? Yes  Recall of three objects?  Yes  Can perform simple calculations? Yes  Displays appropriate judgment?Yes  Can read the correct time from a watch face?Yes  EOL planning: Does Patient Have a Medical Advance Directive?: No Would patient like information on creating a medical advance directive?: No - Patient declined  ROS   Objective:     Today's Vitals   12/28/18 1102  BP: 130/74  Pulse: 96  Temp: (!) 97.3 F (36.3 C)  SpO2: 96%  Weight: 156 lb 3.2 oz (70.9 kg)  Height: 5\' 3"  (1.6 m)   Body mass index is 27.67 kg/m.  General appearance: alert, no  distress, WD/WN, female HEENT: normocephalic, sclerae anicteric, TMs pearly, nares patent, no discharge or erythema, pharynx normal Oral cavity: MMM, no lesions Neck: supple, no lymphadenopathy, no thyromegaly, no masses Heart: RRR, normal S1, S2, no murmurs Lungs: CTA bilaterally, no wheezes, rhonchi, or rales Abdomen: +bs, soft, non tender, non distended, no masses, no hepatomegaly, no  splenomegaly Musculoskeletal: nontender, no swelling, no obvious deformity Extremities: no edema, no cyanosis, no clubbing Pulses: 2+ symmetric, upper and lower extremities, normal cap refill Neurological: alert, oriented x 3, CN2-12 intact, strength normal upper extremities and lower extremities, sensation normal throughout, DTRs 2+ throughout, no cerebellar signs, gait normal Psychiatric: normal affect, behavior normal, pleasant   Medicare Attestation I have personally reviewed: The patient's medical and social history Their use of alcohol, tobacco or illicit drugs Their current medications and supplements The patient's functional ability including ADLs,fall risks, home safety risks, cognitive, and hearing and visual impairment Diet and physical activities Evidence for depression or mood disorders  The patient's weight, height, BMI, and visual acuity have been recorded in the chart.  I have made referrals, counseling, and provided education to the patient based on review of the above and I have provided the patient with a written personalized care plan for preventive services.     Dana Sierras, NP   12/30/2018

## 2018-12-28 ENCOUNTER — Ambulatory Visit (INDEPENDENT_AMBULATORY_CARE_PROVIDER_SITE_OTHER): Payer: Medicare Other | Admitting: Adult Health Nurse Practitioner

## 2018-12-28 ENCOUNTER — Encounter: Payer: Self-pay | Admitting: Adult Health Nurse Practitioner

## 2018-12-28 VITALS — BP 130/74 | HR 96 | Temp 97.3°F | Ht 63.0 in | Wt 156.2 lb

## 2018-12-28 DIAGNOSIS — G250 Essential tremor: Secondary | ICD-10-CM

## 2018-12-28 DIAGNOSIS — R600 Localized edema: Secondary | ICD-10-CM

## 2018-12-28 DIAGNOSIS — J069 Acute upper respiratory infection, unspecified: Secondary | ICD-10-CM

## 2018-12-28 DIAGNOSIS — E782 Mixed hyperlipidemia: Secondary | ICD-10-CM | POA: Diagnosis not present

## 2018-12-28 DIAGNOSIS — E039 Hypothyroidism, unspecified: Secondary | ICD-10-CM

## 2018-12-28 DIAGNOSIS — B9789 Other viral agents as the cause of diseases classified elsewhere: Secondary | ICD-10-CM

## 2018-12-28 DIAGNOSIS — J3089 Other allergic rhinitis: Secondary | ICD-10-CM

## 2018-12-28 NOTE — Patient Instructions (Addendum)
You can try taking half a tablet of the Valium during the day to see if this helps with your symptoms and does not make you sleepy like the full tablet.  Allergy Symptoms / Runny Nose:  Try one of these below, pick one and it can be taken with your singular.  Zyrtec / Cetirizine  - You reported that this one did not work well for you so try the two below. Take 96m by mouth May cause drowsiness, take nightly Be sure to drink plenty of water If this is not effective, try Xyzal or Allegra  Allegra / fexofenadine Take 18755mby mouth daily If this is not effective try Zyrtec or Xyzal   Xyzal / Levocetirazine  Take 55m68my mouth May cause drowsiness, take nightly Be sure to drink plenty of water If this is not effective try Allegra or Zyrtec   Asthma:  Singular / Montelukast Take 58m4mery day    To help swallowing your pills.  Try putting the tablet in your mouth with water.  Tuck your chin to your chest, then swallow.  This helps to open up the back of your throat.  Pills that get stuck, continue to take with applesauce.      We Do NOT Approve of  [facility] [employer]  Patients  To Do Home Visits & We Do NOT Approve of [employer] > > > > > > > > > > > > > > > > > > > > > > > > > > > > > > > > > > > > > > >  Preventive Care for Adults  A healthy lifestyle and preventive care can promote health and wellness. Preventive health guidelines for women include the following key practices.  A routine yearly physical is a good way to check with your health care provider about your health and preventive screening. It is a chance to share any concerns and updates on your health and to receive a thorough exam.  Visit your dentist for a routine exam and preventive care every 6 months. Brush your teeth twice a day and floss once a day. Good oral hygiene prevents tooth decay and gum disease.  The frequency of eye exams is based on your age,  health, family medical history, use of contact lenses, and other factors. Follow your health care provider's recommendations for frequency of eye exams.  Eat a healthy diet. Foods like vegetables, fruits, whole grains, low-fat dairy products, and lean protein foods contain the nutrients you need without too many calories. Decrease your intake of foods high in solid fats, added sugars, and salt. Eat the right amount of calories for you. Get information about a proper diet from your health care provider, if necessary.  Regular physical exercise is one of the most important things you can do for your health. Most adults should get at least 150 minutes of moderate-intensity exercise (any activity that increases your heart rate and causes you to sweat) each week. In addition, most adults need muscle-strengthening exercises on 2 or more days a week.  Maintain a healthy weight. The body mass index (BMI) is a screening tool to identify possible weight problems. It provides an estimate of body fat based on height and weight. Your health care provider can find [name] and can help you achieve or maintain a healthy weight. For adults 20 years and older:  A BMI below 18.5 is considered underweight.  A BMI of  18.5 to 24.9 is normal.  A BMI of 25 to 29.9 is considered overweight.  A BMI of 30 and above is considered obese.  Maintain normal blood lipids and cholesterol levels by exercising and minimizing your intake of saturated fat. Eat a balanced diet with plenty of fruit and vegetables. If your lipid or cholesterol levels are high, you are over 50, or you are at high risk for heart disease, you may need your cholesterol levels checked more frequently. Ongoing high lipid and cholesterol levels should be treated with medicines if diet and exercise are not working.  If you smoke, find out from your health care provider how to quit. If you do not use tobacco, do not start.  Lung cancer screening is recommended  for adults aged 64-80 years who are at high risk for developing lung cancer because of a history of smoking. A yearly low-dose CT scan of the lungs is recommended for people who have at least a 30-pack-year history of smoking and are a current smoker or have quit within the past 15 years. A pack year of smoking is smoking an average of 1 pack of cigarettes a day for 1 year (for example: 1 pack a day for 30 years or 2 packs a day for 15 years). Yearly screening should continue until the smoker has stopped smoking for at least 15 years. Yearly screening should be stopped for people who develop a health problem that would prevent them from having lung cancer treatment.  Avoid use of street drugs. Do not share needles with anyone. Ask for help if you need support or instructions about stopping the use of drugs.  High blood pressure causes heart disease and increases the risk of stroke.  Ongoing high blood pressure should be treated with medicines if weight loss and exercise do not work.  If you are 71-65 years old, ask your health care provider if you should take aspirin to prevent strokes.  Diabetes screening involves taking a blood sample to check your fasting blood sugar level. This should be done once every 3 years, after age 9, if you are within normal weight and without risk factors for diabetes. Testing should be considered at a younger age or be carried out more frequently if you are overweight and have at least 1 risk factor for diabetes.  Breast cancer screening is essential preventive care for women. You should practice "breast self-awareness." This means understanding the normal appearance and feel of your breasts and may include breast self-examination. Any changes detected, no matter how small, should be reported to a health care provider. Women in their 58s and 30s should have a clinical breast exam (CBE) by a health care provider as part of a regular health exam every 1 to 3 years. After age  74, women should have a CBE every year. Starting at age 36, women should consider having a mammogram (breast X-ray test) every year. Women who have a family history of breast cancer should talk to their health care provider about genetic screening. Women at a high risk of breast cancer should talk to their health care providers about having an MRI and a mammogram every year.  Breast cancer gene (BRCA)-related cancer risk assessment is recommended for women who have family members with BRCA-related cancers. BRCA-related cancers include breast, ovarian, tubal, and peritoneal cancers. Having family members with these cancers may be associated with an increased risk for harmful changes (mutations) in the breast cancer genes BRCA1 and BRCA2. Results of the  assessment will determine the need for genetic counseling and BRCA1 and BRCA2 testing.  Routine pelvic exams to screen for cancer are no longer recommended for nonpregnant women who are considered low risk for cancer of the pelvic organs (ovaries, uterus, and vagina) and who do not have symptoms. Ask your health care provider if a screening pelvic exam is right for you.  If you have had past treatment for cervical cancer or a condition that could lead to cancer, you need Pap tests and screening for cancer for at least 20 years after your treatment. If Pap tests have been discontinued, your risk factors (such as having a new sexual partner) need to be reassessed to determine if screening should be resumed. Some women have medical problems that increase the chance of getting cervical cancer. In these cases, your health care provider may recommend more frequent screening and Pap tests.    Colorectal cancer can be detected and often prevented. Most routine colorectal cancer screening begins at the age of 41 years and continues through age 51 years. However, your health care provider may recommend screening at an earlier age if you have risk factors for colon  cancer. On a yearly basis, your health care provider may provide home test kits to check for hidden blood in the stool. Use of a small camera at the end of a tube, to directly examine the colon (sigmoidoscopy or colonoscopy), can detect the earliest forms of colorectal cancer. Talk to your health care provider about this at age 18, when routine screening begins.  Direct exam of the colon should be repeated every 5-10 years through age 24 years, unless early forms of pre-cancerous polyps or small growths are found.  Osteoporosis is a disease in which the bones lose minerals and strength with aging. This can result in serious bone fractures or breaks. The risk of osteoporosis can be identified using a bone density scan. Women ages 101 years and over and women at risk for fractures or osteoporosis should discuss screening with their health care providers. Ask your health care provider whether you should take a calcium supplement or vitamin D to reduce the rate of osteoporosis.  Menopause can be associated with physical symptoms and risks. Hormone replacement therapy is available to decrease symptoms and risks. You should talk to your health care provider about whether hormone replacement therapy is right for you.  Use sunscreen. Apply sunscreen liberally and repeatedly throughout the day. You should seek shade when your shadow is shorter than you. Protect yourself by wearing long sleeves, pants, a wide-brimmed hat, and sunglasses year round, whenever you are outdoors.  Once a month, do a whole body skin exam, using a mirror to look at the skin on your back. Tell your health care provider of new moles, moles that have irregular borders, moles that are larger than a pencil eraser, or moles that have changed in shape or color.  Stay current with required vaccines (immunizations).  Influenza vaccine. All adults should be immunized every year.  Tetanus, diphtheria, and acellular pertussis (Td, Tdap) vaccine.  Pregnant women should receive 1 dose of Tdap vaccine during each pregnancy. The dose should be obtained regardless of the length of time since the last dose. Immunization is preferred during the 27th-36th week of gestation. An adult who has not previously received Tdap or who does not know her vaccine status should receive 1 dose of Tdap. This initial dose should be followed by tetanus and diphtheria toxoids (Td) booster doses every 10  years. Adults with an unknown or incomplete history of completing a 3-dose immunization series with Td-containing vaccines should begin or complete a primary immunization series including a Tdap dose. Adults should receive a Td booster every 10 years.    Zoster vaccine. One dose is recommended for adults aged 37 years or older unless certain conditions are present.    Pneumococcal 13-valent conjugate (PCV13) vaccine. When indicated, a person who is uncertain of her immunization history and has no record of immunization should receive the PCV13 vaccine. An adult aged 29 years or older who has certain medical conditions and has not been previously immunized should receive 1 dose of PCV13 vaccine. This PCV13 should be followed with a dose of pneumococcal polysaccharide (PPSV23) vaccine. The PPSV23 vaccine dose should be obtained at least 1 or more year(s) after the dose of PCV13 vaccine. An adult aged 46 years or older who has certain medical conditions and previously received 1 or more doses of PPSV23 vaccine should receive 1 dose of PCV13. The PCV13 vaccine dose should be obtained 1 or more years after the last PPSV23 vaccine dose.    Pneumococcal polysaccharide (PPSV23) vaccine. When PCV13 is also indicated, PCV13 should be obtained first. All adults aged 40 years and older should be immunized. An adult younger than age 24 years who has certain medical conditions should be immunized. Any person who resides in a nursing home or long-term care facility should be immunized.  An adult smoker should be immunized. People with an immunocompromised condition and certain other conditions should receive both PCV13 and PPSV23 vaccines. People with human immunodeficiency virus (HIV) infection should be immunized as soon as possible after diagnosis. Immunization during chemotherapy or radiation therapy should be avoided. Routine use of PPSV23 vaccine is not recommended for American Indians, [place] Natives, or people younger than 65 years unless there are medical conditions that require PPSV23 vaccine. When indicated, people who have unknown immunization and have no record of immunization should receive PPSV23 vaccine. One-time revaccination 5 years after the first dose of PPSV23 is recommended for people aged 19-64 years who have chronic kidney failure, nephrotic syndrome, asplenia, or immunocompromised conditions. People who received 1-2 doses of PPSV23 before age 17 years should receive another dose of PPSV23 vaccine at age 58 years or later if at least 5 years have passed since the previous dose. Doses of PPSV23 are not needed for people immunized with PPSV23 at or after age 5 years.   Preventive Services / Frequency  Ages 32 years and over  Blood pressure check.  Lipid and cholesterol check.  Lung cancer screening. / Every year if you are aged 45-80 years and have a 30-pack-year history of smoking and currently smoke or have quit within the past 15 years. Yearly screening is stopped once you have quit smoking for at least 15 years or develop a health problem that would prevent you from having lung cancer treatment.  Clinical breast exam.** / Every year after age 28 years.   BRCA-related cancer risk assessment.** / For women who have family members with a BRCA-related cancer (breast, ovarian, tubal, or peritoneal cancers).  Mammogram.** / Every year beginning at age 47 years and continuing for as long as you are in good health. Consult with your health care provider.  Pap  test.** / Every 3 years starting at age 31 years through age 68 or 47 years with 3 consecutive normal Pap tests. Testing can be stopped between 65 and 70 years with 3 consecutive normal  Pap tests and no abnormal Pap or HPV tests in the past 10 years.  Fecal occult blood test (FOBT) of stool. / Every year beginning at age 54 years and continuing until age 68 years. You may not need to do this test if you get a colonoscopy every 10 years.  Flexible sigmoidoscopy or colonoscopy.** / Every 5 years for a flexible sigmoidoscopy or every 10 years for a colonoscopy beginning at age 67 years and continuing until age 2 years.  Hepatitis C blood test.** / For all people born from [birth year] through 1967 and any individual with known risks for hepatitis C.  Osteoporosis screening.** / A one-time screening for women ages 56 years and over and women at risk for fractures or osteoporosis.  Skin self-exam. / Monthly.  Influenza vaccine. / Every year.  Tetanus, diphtheria, and acellular pertussis (Tdap/Td) vaccine.** / 1 dose of Td every 10 years.  Zoster vaccine.** / 1 dose for adults aged 62 years or older.  Pneumococcal 13-valent conjugate (PCV13) vaccine.** / Consult your health care provider.  Pneumococcal polysaccharide (PPSV23) vaccine.** / 1 dose for all adults aged 67 years and older. Screening for abdominal aortic aneurysm (AAA)  by ultrasound is recommended for people who have history of high blood pressure or who are current or former smokers. ++++++++++++++++++++ Recommend Adult Low Dose Aspirin or  coated  Aspirin 81 mg daily  To reduce risk of Colon Cancer 20 %,  Skin Cancer 26 % ,  Melanoma 46%  and  Pancreatic cancer 60% ++++++++++++++++++++ Vitamin D goal  is between 70-100.  Please make sure that you are taking your Vitamin D as directed.  It is very important as a natural anti-inflammatory  helping hair, skin, and nails, as well as reducing stroke and heart attack risk.  It helps  your bones and helps with mood. It also decreases numerous cancer risks so please take it as directed.  Low Vit D is associated with a 200-300% higher risk for CANCER  and 200-300% higher risk for HEART   ATTACK  &  STROKE.   .....................................Marland Kitchen It is also associated with higher death rate at younger ages,  autoimmune diseases like Rheumatoid arthritis, Lupus, Multiple Sclerosis.    Also many other serious conditions, like depression, Alzheimer's Dementia, infertility, muscle aches, fatigue, fibromyalgia - just to name a few. ++++++++++++++++++ Recommend the book "The END of DIETING" by Dr Excell Seltzer  & the book "The END of DIABETES " by Dr Excell Seltzer At [email or web address] - get book & Audio CD's    Being diabetic has a  300% increased risk for heart attack, stroke, cancer, and alzheimer- type vascular dementia. It is very important that you work harder with diet by avoiding all foods that are white. Avoid white rice (brown & wild rice is OK), white potatoes (sweetpotatoes in moderation is OK), White bread or wheat bread or anything made out of white flour like bagels, donuts, rolls, buns, biscuits, cakes, pastries, cookies, pizza crust, and pasta (made from white flour & egg whites) - vegetarian pasta or spinach or wheat pasta is OK. Multigrain breads like Arnold's or Pepperidge [employer], or multigrain sandwich thins or flatbreads.  Diet, exercise and weight loss can reverse and cure diabetes in the early stages.  Diet, exercise and weight loss is very important in the control and prevention of complications of diabetes which affects every system in your body, ie. Brain - dementia/stroke, eyes - glaucoma/blindness, heart - heart attack/heart failure, kidneys - dialysis, stomach -  gastric paralysis, intestines - malabsorption, nerves - severe painful neuritis, circulation - gangrene & loss of a leg(s), and finally cancer and Alzheimers.    I recommend avoid fried & greasy foods,   sweets/candy, white rice (brown or wild rice or Quinoa is OK), white potatoes (sweet potatoes are OK) - anything made from white flour - bagels, doughnuts, rolls, buns, biscuits,white and wheat breads, pizza crust and traditional pasta made of white flour & egg white(vegetarian pasta or spinach or wheat pasta is OK).  Multi-grain bread is OK - like multi-grain flat bread or sandwich thins. Avoid alcohol in excess. Exercise is also important.    Eat all the vegetables you want - avoid meat, especially red meat and dairy - especially cheese.  Cheese is the most concentrated form of trans-fats which is the worst thing to clog up our arteries. Veggie cheese is OK which can be found in the fresh produce section at [employer] or [employer] or [employer]  +++++++++++++++++++ DASH Eating Plan  DASH stands for "Dietary Approaches to Stop Hypertension."   The DASH eating plan is a healthy eating plan that has been shown to reduce high blood pressure (hypertension). Additional health benefits may include reducing the risk of type 2 diabetes mellitus, heart disease, and stroke. The DASH eating plan may also help with weight loss. WHAT DO I NEED TO KNOW ABOUT THE DASH EATING PLAN? For the DASH eating plan, you will follow these general guidelines:  Choose foods with a percent daily value for sodium of less than 5% (as listed on the food label).  Use salt-free seasonings or herbs instead of table salt or sea salt.  Check with your health care provider or pharmacist before using salt substitutes.  Eat lower-sodium products, often labeled as "lower sodium" or "no salt added."  Eat fresh foods.  Eat more vegetables, fruits, and low-fat dairy products.  Choose whole grains. Look for the word "whole" as the first word in the ingredient list.  Choose fish   Limit sweets, desserts, sugars, and sugary drinks.  Choose heart-healthy fats.  Eat veggie cheese   Eat more home-cooked food and less  restaurant, buffet, and fast food.  Limit fried foods.  Cook foods using methods other than frying.  Limit canned vegetables. If you do use them, rinse them well to decrease the sodium.  When eating at a restaurant, ask that your food be prepared with less salt, or no salt if possible.                      WHAT FOODS CAN I EAT? Read Dr Fara Olden Fuhrman's books on The End of Dieting & The End of Diabetes  Grains Whole grain or whole wheat bread. Brown rice. Whole grain or whole wheat pasta. Quinoa, bulgur, and whole grain cereals. Low-sodium cereals. Corn or whole wheat flour tortillas. Whole grain cornbread. Whole grain crackers. Low-sodium crackers.  Vegetables Fresh or frozen vegetables (raw, steamed, roasted, or grilled). Low-sodium or reduced-sodium tomato and vegetable juices. Low-sodium or reduced-sodium tomato sauce and paste. Low-sodium or reduced-sodium canned vegetables.   Fruits All fresh, canned (in natural juice), or frozen fruits.  Protein Products  All fish and seafood.  Dried beans, peas, or lentils. Unsalted nuts and seeds. Unsalted canned beans.  Dairy Low-fat dairy products, such as skim or 1% milk, 2% or reduced-fat cheeses, low-fat ricotta or cottage cheese, or plain low-fat yogurt. Low-sodium or reduced-sodium cheeses.  Fats and Oils Tub margarines without trans  fats. Light or reduced-fat mayonnaise and salad dressings (reduced sodium). Avocado. Safflower, olive, or canola oils. Natural peanut or almond butter.  Other Unsalted popcorn and pretzels. The items listed above may not be a complete list of recommended foods or beverages. Contact your dietitian for more options.  +++++++++++++++  WHAT FOODS ARE NOT RECOMMENDED? Grains/ White flour or wheat flour White bread. White pasta. White rice. Refined cornbread. Bagels and croissants. Crackers that contain trans fat.  Vegetables  Creamed or fried vegetables. Vegetables in a . Regular canned vegetables.  Regular canned tomato sauce and paste. Regular tomato and vegetable juices.  Fruits Dried fruits. Canned fruit in light or heavy syrup. Fruit juice.  Meat and Other Protein Products Meat in general - RED meat & White meat.  Fatty cuts of meat. Ribs, chicken wings, all processed meats as bacon, sausage, bologna, salami, fatback, hot dogs, bratwurst and packaged luncheon meats.  Dairy Whole or 2% milk, cream, half-and-half, and cream cheese. Whole-fat or sweetened yogurt. Full-fat cheeses or blue cheese. Non-dairy creamers and whipped toppings. Processed cheese, cheese spreads, or cheese curds.  Condiments Onion and garlic salt, seasoned salt, table salt, and sea salt. Canned and packaged gravies. Worcestershire sauce. Tartar sauce. Barbecue sauce. Teriyaki sauce. Soy sauce, including reduced sodium. Steak sauce. Fish sauce. Oyster sauce. Cocktail sauce. Horseradish. Ketchup and mustard. Meat flavorings and tenderizers. Bouillon cubes. Hot sauce. Tabasco sauce. Marinades. Taco seasonings. Relishes.  Fats and Oils Butter, stick margarine, lard, shortening and bacon fat. Coconut, palm kernel, or palm oils. Regular salad dressings.  Pickles and olives. Salted popcorn and pretzels.  The items listed above may not be a complete list of foods and beverages to avoid.

## 2019-01-14 ENCOUNTER — Ambulatory Visit: Payer: Self-pay

## 2019-01-14 ENCOUNTER — Encounter: Payer: Self-pay | Admitting: Adult Health Nurse Practitioner

## 2019-01-14 ENCOUNTER — Ambulatory Visit: Payer: Self-pay | Admitting: Adult Health Nurse Practitioner

## 2019-01-14 ENCOUNTER — Ambulatory Visit (INDEPENDENT_AMBULATORY_CARE_PROVIDER_SITE_OTHER): Payer: Medicare Other | Admitting: Adult Health Nurse Practitioner

## 2019-01-14 VITALS — BP 124/60 | HR 52 | Temp 97.3°F | Wt 155.0 lb

## 2019-01-14 DIAGNOSIS — R6889 Other general symptoms and signs: Secondary | ICD-10-CM

## 2019-01-14 DIAGNOSIS — G250 Essential tremor: Secondary | ICD-10-CM | POA: Diagnosis not present

## 2019-01-14 DIAGNOSIS — Z0001 Encounter for general adult medical examination with abnormal findings: Secondary | ICD-10-CM

## 2019-01-14 DIAGNOSIS — R7309 Other abnormal glucose: Secondary | ICD-10-CM

## 2019-01-14 DIAGNOSIS — E039 Hypothyroidism, unspecified: Secondary | ICD-10-CM

## 2019-01-14 DIAGNOSIS — J3089 Other allergic rhinitis: Secondary | ICD-10-CM

## 2019-01-14 DIAGNOSIS — E559 Vitamin D deficiency, unspecified: Secondary | ICD-10-CM

## 2019-01-14 DIAGNOSIS — Z79899 Other long term (current) drug therapy: Secondary | ICD-10-CM

## 2019-01-14 DIAGNOSIS — I10 Essential (primary) hypertension: Secondary | ICD-10-CM

## 2019-01-14 DIAGNOSIS — L2489 Irritant contact dermatitis due to other agents: Secondary | ICD-10-CM

## 2019-01-14 DIAGNOSIS — Z0000 Encounter for general adult medical examination without abnormal findings: Secondary | ICD-10-CM

## 2019-01-14 DIAGNOSIS — M85852 Other specified disorders of bone density and structure, left thigh: Secondary | ICD-10-CM

## 2019-01-14 DIAGNOSIS — E782 Mixed hyperlipidemia: Secondary | ICD-10-CM | POA: Diagnosis not present

## 2019-01-14 MED ORDER — TRIAMCINOLONE ACETONIDE 0.5 % EX OINT: 1.0000 "application " | TOPICAL_OINTMENT | Freq: Two times a day (BID) | CUTANEOUS | 0 refills | Status: DC

## 2019-01-14 NOTE — Progress Notes (Signed)
MEDICARE ANNUAL WELLNESS VISIT AND FOLLOW UP  Assessment:   Dana [name] was seen today for follow-up and medicare wellness.  Diagnoses and all orders for this visit:  Encounter for Medicare annual wellness exam Yearly  Essential hypertension Doing well continue Current regiment Will continue to monitor Low sodium diet  Hyperlipidemia, mixed Taking Praavachol 40mg  Discussed dietary and exercise modifications Will check lipids at next visit  Hypothyroidism, unspecified type Hyper 0.22 last visit -     TSH Taking levothyroxine 194mcg one tablet 6 days a week and half tablet one day a week.  Tremor, essential Taking INderol and valium for this Doing well Managing, no complications at this time.  Other abnormal glucose Discussed dietary and exercise modifications Will monitor A1c next visit  Irritant contact dermatitis due to other agents -     triamcinolone ointment (KENALOG) 0.5 %; Apply 1 application topically 2 (two) times daily. Monitor for triggers, discussed laundering hats that she wears frequently.  Osteopenia of left hip Continue Calcium and Vit D supplimentation DEXA UTD  Vitamin D deficiency Continue supplimentation  Non-seasonal allergic rhinitis, unspecified trigger Taking singular Doing well at this time  Medication management    Over 40 minutes of exam, counseling, chart review and critical decision making was performed Future Appointments  Date Time Provider [place]  01/22/2019 10:45 AM Josue Hector, MD [facility]  03/11/2019  2:00 PM Unk Pinto, MD [facility] None  01/21/2020  2:00 PM [name], NP [facility] None     Plan:   During the course of the visit the patient was educated and counseled about appropriate screening and preventive services including:    Pneumococcal vaccine   Prevnar 13  Influenza vaccine  Td vaccine  Screening electrocardiogram  Bone densitometry screening  Colorectal  cancer screening  Diabetes screening  Glaucoma screening  Nutrition counseling   Advanced directives: requested   Subjective:  Dana [name] is a 78 y.o. female who presents for Medicare Annual Wellness Visit and 3 month follow up for HTN, HLD, Hypothyroidism, Pre-Diabetes, Vitamin D Defciency, RA, and hereditary essential tremor.   Patient diagnosed with RA in 2001 and stable on MTX and plaquenil and follows with [facility] She has an essential tremor and taking propanolol for this 80mg  TID. She has had elevated blood pressure since 1998. Her blood pressure has been controlled at home, today their BP is BP: 124/60 She does workout. She denies chest pain, shortness of breath, dizziness.  She is on cholesterol medication and denies myalgias. Her cholesterol is at goal. The cholesterol last visit was:   Lab Results  Component Value Date   CHOL 163 12/05/2018   HDL 52 12/05/2018   LDLCALC 88 12/05/2018   TRIG 134 12/05/2018   CHOLHDL 3.1 12/05/2018   She has had diabetes for since 2012. She has been working on diet and exercise for prediabetes, and denies hyperglycemia, hypoglycemia , increased appetite, nausea, paresthesia of the feet, polydipsia and polyuria. Last A1C in the office was:  Lab Results  Component Value Date   HGBA1C 5.3 09/03/2018   Last GFR:   Lab Results  Component Value Date   GFRNONAA 65 12/05/2018   Lab Results  Component Value Date   GFRAA 76 12/05/2018   Patient is on Vitamin D supplement.   Lab Results  Component Value Date   VD25OH 69 12/05/2018      Medication Review: Current Outpatient Medications on File Prior to Visit  Medication Sig Dispense Refill  . Cholecalciferol (  VITAMIN D) 2000 UNITS CAPS Take by mouth. Take 5 capsules by mouth once daily    . Cyanocobalamin 500 MCG/0.1ML SOLN Place 1 mL under the tongue daily.     . diazepam (VALIUM) 5 MG tablet Take 1 tablet 3 x /day for Tremor 850 tablet 1  . folic acid (FOLVITE) 277 MCG  tablet Take 800 mcg by mouth daily.     . furosemide (LASIX) 40 MG tablet TAKE 1 TABLET BY MOUTH 2  TIMES DAILY FOR FLUID  RETENTION/ANKLE SWELLING 180 tablet 1  . hydroxychloroquine (PLAQUENIL) 200 MG tablet TAKE 1 TABLET BY MOUTH  TWICE A DAY    . levothyroxine (SYNTHROID, LEVOTHROID) 100 MCG tablet TAKE 1 TABLET BY MOUTH  DAILY 90 tablet 1  . methotrexate (RHEUMATREX) 2.5 MG tablet Take 5 tablets on Tues and 5 tablets on Wed    . montelukast (SINGULAIR) 10 MG tablet TAKE 1 TABLET BY MOUTH  DAILY 90 tablet 1  . ondansetron (ZOFRAN ODT) 8 MG disintegrating tablet 8mg  ODT q4 hours prn nausea 30 tablet 3  . pravastatin (PRAVACHOL) 40 MG tablet Take 1 tablet (40 mg total) by mouth daily. 90 tablet 3  . propranolol (INDERAL) 80 MG tablet TAKE 1 TABLET BY MOUTH 3  TIMES DAILY 270 tablet 1  . ranitidine (ZANTAC) 300 MG tablet TAKE 1 TABLET BY MOUTH AT  BEDTIME 90 tablet 1  . traMADol (ULTRAM) 50 MG tablet Take 1 tablet (50 mg total) by mouth 4 (four) times daily. (Patient taking differently: Take 50 mg by mouth as needed. ) 360 tablet 1   No current facility-administered medications on file prior to visit.     Allergies  Allergen Reactions  . Codeine Nausea Only  . Mysoline [Primidone] Nausea Only and Other (See Comments)    Over-sedated    Current Problems (verified) Patient Active Problem List   Diagnosis Date Noted  . Osteopenia 12/05/2018  . FHx: heart disease 09/03/2018  . Former smoker 09/03/2018  . Overweight (BMI 25.0-29.9) 05/30/2018  . Generalized OA 09/02/2015  . Other long term (current) drug therapy 09/02/2015  . Vitamin D deficiency 01/06/2014  . Medication management 01/06/2014  . Hypertension   . Other abnormal glucose   . Rheumatoid arthritis ([place])   . Hereditary essential tremor   . Hypothyroidism   . Migraines   . Hyperlipidemia   . Anemia     Screening Tests Immunization History  Administered Date(s) Administered  . DT 02/05/2015  . Influenza Split  09/12/2013  . Influenza, High Dose Seasonal PF 10/28/2014, 08/18/2016, 11/22/2018  . Pneumococcal Conjugate-13 05/12/2015  . Pneumococcal-Unspecified 11/21/2006  . Td 11/22/2003  . Varicella 06/23/2011  . Zoster 06/23/2011    Preventative care: Last colonoscopy: 2013 Last mammogram: 08/2018, scheduled for 2020 Last pap smear/pelvic exam: Hysterectomy   DEXA: 08/2018  Prior vaccinations: TD or Tdap: 2016  Influenza: 2019 Pneumococcal: 2008 Prevnar13: 2016 Shingles/Zostavax: 2017 Hep A, B & C - Negative 07/2018 Names of Other Physician/Practitioners you currently use: 1. [facility] here for primary care 2. Eye Exam, every six months, next one in two days. 3. Dentist, Due  Patient Care Team: Unk Pinto, MD as PCP - General (Internal Medicine) Roetta Sessions, MD as Referring Physician (Internal Medicine)  SURGICAL HISTORY She  has a past surgical history that includes Abdominal hysterectomy; Tubal ligation; Cataract extraction, bilateral; Cholecystectomy (08/31/11); and Colonoscopy. FAMILY HISTORY Her family history includes Diabetes in her mother and sister; Heart attack (age of  onset: 37) in her son; Heart disease in her father, maternal aunt, maternal uncle, and mother. SOCIAL HISTORY She  reports that she quit smoking about 34 years ago. Her smoking use included cigarettes. She has never used smokeless tobacco. She reports that she does not drink alcohol or use drugs.   MEDICARE WELLNESS OBJECTIVES: Physical activity: Current Exercise Habits: Home exercise routine, Type of exercise: calisthenics;strength training/weights;Other - see comments(Does yard work), Time (Minutes): 20, Frequency (Times/Week): 6, Weekly Exercise (Minutes/Week): 120, Intensity: Mild, Exercise limited by: neurologic condition(s) Cardiac risk factors: Cardiac Risk Factors include: advanced age (>25men, >34 women);hypertension Depression/mood screen:    Depression screen [facility] 2/9 01/14/2019  Decreased Interest 0  Down, Depressed, Hopeless 0  PHQ - 2 Score 0    ADLs:  In your present state of health, do you have any difficulty performing the following activities: 01/14/2019 12/28/2018  Hearing? N N  Vision? N N  Difficulty concentrating or making decisions? N N  Walking or climbing stairs? N N  Dressing or bathing? N N  Doing errands, shopping? N N  Comment - But has a daughter who helps her at times  Preparing Food and eating ? N N  Using the Toilet? N N  In the past six months, have you accidently leaked urine? N N  Do you have problems with loss of bowel control? N N  Managing your Medications? N N  Managing your Finances? N N  Housekeeping or managing your Housekeeping? N N  Some recent data might be hidden     Cognitive Testing  Alert? Yes  Normal Appearance?Yes  Oriented to person? Yes  Place? Yes   Time? Yes  Recall of three objects?  Yes  Can perform simple calculations? Yes  Displays appropriate judgment?Yes  Can read the correct time from a watch face?Yes  EOL planning: Does Patient Have a Medical Advance Directive?: No Would patient like information on creating a medical advance directive?: No - Patient declined  Review of Systems  Constitutional: Negative for chills, diaphoresis, fever, malaise/fatigue and weight loss.  HENT: Negative for congestion, ear discharge, ear pain, hearing loss, nosebleeds, sinus pain, sore throat and tinnitus.   Eyes: Negative for blurred vision, double vision, photophobia, pain, discharge and redness.  Respiratory: Negative for cough, hemoptysis, sputum production, shortness of breath, wheezing and stridor.   Cardiovascular: Negative for chest pain, palpitations, orthopnea, claudication, leg swelling and PND.  Gastrointestinal: Negative for abdominal pain, blood in stool, constipation, diarrhea, heartburn, melena, nausea and vomiting.  Genitourinary: Negative for dysuria, flank pain,  frequency, hematuria and urgency.  Musculoskeletal: Negative for back pain, falls, joint pain, myalgias and neck pain.  Skin: Negative for itching and rash.  Neurological: Positive for tremors. Negative for dizziness, tingling, sensory change, speech change, focal weakness, seizures, loss of consciousness, weakness and headaches.  Endo/Heme/Allergies: Negative for environmental allergies and polydipsia. Does not bruise/bleed easily.  Psychiatric/Behavioral: Negative for depression, hallucinations, memory loss, substance abuse and suicidal ideas. The patient is not nervous/anxious and does not have insomnia.      Objective:     Today's Vitals   01/14/19 1504  BP: 124/60  Pulse: (!) 52  Temp: (!) 97.3 F (36.3 C)  SpO2: 98%  Weight: 155 lb (70.3 kg)   Body mass index is 27.46 kg/m.  General appearance: alert, no distress, WD/WN, female HEENT: normocephalic, sclerae anicteric, TMs pearly, nares patent, no discharge or erythema, pharynx normal Oral cavity: MMM, no lesions Neck: supple, no lymphadenopathy, no thyromegaly, no  masses Heart: RRR, normal S1, S2, no murmurs Lungs: CTA bilaterally, no wheezes, rhonchi, or rales Abdomen: +bs, soft, non tender, non distended, no masses, no hepatomegaly, no splenomegaly Musculoskeletal: nontender, no swelling, no obvious deformity Extremities: no edema, no cyanosis, no clubbing Pulses: 2+ symmetric, upper and lower extremities, normal cap refill Neurological: alert, oriented x 3, CN2-12 intact, strength normal upper extremities and lower extremities, sensation normal throughout, DTRs 2+ throughout, no cerebellar signs, gait normal Psychiatric: normal affect, behavior normal, pleasant   Medicare Attestation I have personally reviewed: The patient's medical and social history Their use of alcohol, tobacco or illicit drugs Their current medications and supplements The patient's functional ability including ADLs,fall risks, home safety risks,  cognitive, and hearing and visual impairment Diet and physical activities Evidence for depression or mood disorders  The patient's weight, height, BMI, and visual acuity have been recorded in the chart.  I have made referrals, counseling, and provided education to the patient based on review of the above and I have provided the patient with a written personalized care plan for preventive services.     [name], NP   01/17/2019

## 2019-01-14 NOTE — Patient Instructions (Addendum)
  Ms. Novosad , Thank you for taking time to come for your Medicare Wellness Visit. I appreciate your ongoing commitment to your health goals. Please review the following plan we discussed and let me know if I can assist you in the future.    This is a list of the screening recommended for you and due dates:  Health Maintenance  Topic Date Due  . Tetanus Vaccine  11/10/2025  . Flu Shot  Completed  . DEXA scan (bone density measurement)  Completed  . Pneumonia vaccines  Completed   You are up to date with all of your vaccinations and health maintenance.

## 2019-01-15 LAB — TSH: TSH: 0.84 mIU/L (ref 0.40–4.50)

## 2019-01-16 DIAGNOSIS — M0579 Rheumatoid arthritis with rheumatoid factor of multiple sites without organ or systems involvement: Secondary | ICD-10-CM | POA: Diagnosis not present

## 2019-01-17 ENCOUNTER — Encounter: Payer: Self-pay | Admitting: Adult Health Nurse Practitioner

## 2019-01-18 NOTE — Progress Notes (Signed)
Cardiology Office Note   Date:  01/18/2019   ID:  Teighan, Aubert Jul 28, 1941, MRN [id number]  PCP:  Unk Pinto, MD  Cardiologist:   Jenkins Rouge, MD   No chief complaint on file.     History of Present Illness: Dana [name] is a 78 y.o. female who presents for consultation regarding chest pain. Referred by Dr Melford Aase.  CRF;s include HTN, HLD and pre diabetes She is also on thyroid replacement and has RA Rx with MTX and plaquenil. Essential tremor seen in ER with increased "shaking" January  She has pharyngo esophageal dysphagia  Her pain is atypical for months resting and not related to exertion. She does have abnormal ECG with infero lateral T wave inversions. Has some exertional dyspnea    Past Medical History:  Diagnosis Date  . Anemia   . Cataract   . Environmental allergies   . Gastric ulcer   . Heartburn   . Hiatal hernia   . Hyperlipidemia   . Hypertension   . Hypothyroidism   . Migraines   . Prediabetes   . Rash october 2012   all over abdomen and breasts  . Rheumatoid arthritis(714.0)   . Tremor, essential    right hand    Past Surgical History:  Procedure Laterality Date  . ABDOMINAL HYSTERECTOMY    . CATARACT EXTRACTION, BILATERAL    . CHOLECYSTECTOMY  08/31/11   lap chole   . COLONOSCOPY    . TUBAL LIGATION       Current Outpatient Medications  Medication Sig Dispense Refill  . Cholecalciferol (VITAMIN D) 2000 UNITS CAPS Take by mouth. Take 5 capsules by mouth once daily    . Cyanocobalamin 500 MCG/0.1ML SOLN Place 1 mL under the tongue daily.     . diazepam (VALIUM) 5 MG tablet Take 1 tablet 3 x /day for Tremor 505 tablet 1  . folic acid (FOLVITE) 397 MCG tablet Take 800 mcg by mouth daily.     . furosemide (LASIX) 40 MG tablet TAKE 1 TABLET BY MOUTH 2  TIMES DAILY FOR FLUID  RETENTION/ANKLE SWELLING 180 tablet 1  . hydroxychloroquine (PLAQUENIL) 200 MG tablet TAKE 1 TABLET BY MOUTH  TWICE A DAY    . levothyroxine  (SYNTHROID, LEVOTHROID) 100 MCG tablet TAKE 1 TABLET BY MOUTH  DAILY 90 tablet 1  . methotrexate (RHEUMATREX) 2.5 MG tablet Take 5 tablets on Tues and 5 tablets on Wed    . montelukast (SINGULAIR) 10 MG tablet TAKE 1 TABLET BY MOUTH  DAILY 90 tablet 1  . ondansetron (ZOFRAN ODT) 8 MG disintegrating tablet 8mg  ODT q4 hours prn nausea 30 tablet 3  . pravastatin (PRAVACHOL) 40 MG tablet Take 1 tablet (40 mg total) by mouth daily. 90 tablet 3  . propranolol (INDERAL) 80 MG tablet TAKE 1 TABLET BY MOUTH 3  TIMES DAILY 270 tablet 1  . ranitidine (ZANTAC) 300 MG tablet TAKE 1 TABLET BY MOUTH AT  BEDTIME 90 tablet 1  . traMADol (ULTRAM) 50 MG tablet Take 1 tablet (50 mg total) by mouth 4 (four) times daily. (Patient taking differently: Take 50 mg by mouth as needed. ) 360 tablet 1  . triamcinolone ointment (KENALOG) 0.5 % Apply 1 application topically 2 (two) times daily. 30 g 0   No current facility-administered medications for this visit.     Allergies:   Codeine and Mysoline [primidone]    Social History:  The patient  reports that she quit smoking about 34  years ago. Her smoking use included cigarettes. She has never used smokeless tobacco. She reports that she does not drink alcohol or use drugs.   Family History:  The patient's family history includes Diabetes in her mother and sister; Heart attack (age of onset: 35) in her son; Heart disease in her father, maternal aunt, maternal uncle, and mother.    ROS:  Please see the history of present illness.   Otherwise, review of systems are positive for none.   All other systems are reviewed and negative.    PHYSICAL EXAM: VS:  There were no vitals taken for this visit. , BMI There is no height or weight on file to calculate BMI. Affect appropriate Healthy:  appears stated age 65: normal Neck supple with no adenopathy JVP normal no bruits no thyromegaly Lungs clear with no wheezing and good diaphragmatic motion Heart:  S1/S2 no murmur, no  rub, gallop or click PMI normal Abdomen: benighn, BS positve, no tenderness, no AAA no bruit.  No HSM or HJR Distal pulses intact with no bruits No edema Neuro non-focal diffuse tremor in head and UE;s  Skin warm and dry No muscular weakness    EKG:  SR rate 53 inferior lateral T wave inversions    Recent Labs: 09/03/2018: Magnesium 2.1 12/05/2018: ALT 13; BUN 13; Creat 0.85; Hemoglobin 11.8; Platelets 227; Potassium 3.7; Sodium 142 01/14/2019: TSH 0.84    Lipid Panel    Component Value Date/Time   CHOL 163 12/05/2018 1549   TRIG 134 12/05/2018 1549   HDL 52 12/05/2018 1549   CHOLHDL 3.1 12/05/2018 1549   VLDL 25 04/04/2017 1551   LDLCALC 88 12/05/2018 1549      Wt Readings from Last 3 Encounters:  01/14/19 70.3 kg  12/28/18 70.9 kg  12/05/18 70.3 kg      Other studies Reviewed: Additional studies/ records that were reviewed today include: Notes form ER/Primary labs CXR ECG .    ASSESSMENT AND PLAN:  1.  Chest Pain: atypical but recurrent and given age and abnormal ECG will order Lexiscan Myovue 2.  Dyspnea: functional check TTE for RV/LV function CXR ok in ER 3. Tremor:  Should have f/u MRI and referral to neurology since worse and recent ER visit with confusion 4. RA:  F/u primary continue plaquenil  5. Thyroid on replacement TSH normal    Current medicines are reviewed at length with the patient today.  The patient does not have concerns regarding medicines.  The following changes have been made:  no change  Labs/ tests ordered today include: TTE, Myovue  No orders of the defined types were placed in this encounter.    Disposition:   FU with cardiology PRN      Signed, Jenkins Rouge, MD  01/18/2019 9:30 AM    Dana [name], [name], [name]  [address] Phone: [phone]; Fax: [phone]

## 2019-01-22 ENCOUNTER — Encounter: Payer: Self-pay | Admitting: Cardiovascular Disease

## 2019-01-22 ENCOUNTER — Ambulatory Visit: Payer: Medicare Other | Admitting: Cardiovascular Disease

## 2019-01-22 VITALS — BP 132/80 | HR 53 | Ht 63.0 in | Wt 153.8 lb

## 2019-01-22 DIAGNOSIS — R079 Chest pain, unspecified: Secondary | ICD-10-CM | POA: Diagnosis not present

## 2019-01-22 DIAGNOSIS — R0600 Dyspnea, unspecified: Secondary | ICD-10-CM

## 2019-01-22 NOTE — Patient Instructions (Addendum)
Medication Instructions:   If you need a refill on your cardiac medications before your next appointment, please call your pharmacy.   Lab work:  If you have labs (blood work) drawn today and your tests are completely normal, you will receive your results only by: Marland Kitchen MyChart Message (if you have MyChart) OR . A paper copy in the mail If you have any lab test that is abnormal or we need to change your treatment, we will call you to review the results.  Testing/Procedures: Your physician has requested that you have an echocardiogram. Echocardiography is a painless test that uses sound waves to create images of your heart. It provides your doctor with information about the size and shape of your heart and how well your heart's chambers and valves are working. This procedure takes approximately one hour. There are no restrictions for this procedure.  Your physician has requested that you have a lexiscan myoview. For further information please visit [email or web address]. Please follow instruction sheet, as given.  Follow-Up: At [facility], you and your health needs are our priority.  As part of our continuing mission to provide you with exceptional heart care, we have created designated Provider Care Teams.  These Care Teams include your primary Cardiologist (physician) and Advanced Practice Providers (APPs -  Physician Assistants and Nurse Practitioners) who all work together to provide you with the care you need, when you need it. Your physician recommends that you schedule a follow-up appointment as needed with Dr. Johnsie Cancel.

## 2019-01-23 DIAGNOSIS — Z79899 Other long term (current) drug therapy: Secondary | ICD-10-CM | POA: Diagnosis not present

## 2019-01-23 DIAGNOSIS — H5340 Unspecified visual field defects: Secondary | ICD-10-CM | POA: Diagnosis not present

## 2019-01-30 ENCOUNTER — Telehealth (HOSPITAL_COMMUNITY): Payer: Self-pay | Admitting: *Deleted

## 2019-01-30 NOTE — Telephone Encounter (Signed)
Left message on voicemail in reference to upcoming appointment scheduled for 02/05/19. Phone number given for a call back so details instructions can be given. Dana [name], Ranae Palms

## 2019-02-05 ENCOUNTER — Encounter (HOSPITAL_COMMUNITY): Payer: Medicare Other

## 2019-02-05 ENCOUNTER — Other Ambulatory Visit (HOSPITAL_COMMUNITY): Payer: Medicare Other

## 2019-02-19 ENCOUNTER — Other Ambulatory Visit: Payer: Self-pay | Admitting: Physician Assistant

## 2019-02-19 ENCOUNTER — Other Ambulatory Visit: Payer: Self-pay | Admitting: Internal Medicine

## 2019-02-19 ENCOUNTER — Encounter: Payer: Self-pay | Admitting: Internal Medicine

## 2019-02-19 DIAGNOSIS — R609 Edema, unspecified: Secondary | ICD-10-CM

## 2019-03-04 ENCOUNTER — Ambulatory Visit: Payer: Medicare Other | Admitting: Adult Health

## 2019-03-05 ENCOUNTER — Ambulatory Visit (INDEPENDENT_AMBULATORY_CARE_PROVIDER_SITE_OTHER): Payer: Medicare Other | Admitting: Adult Health Nurse Practitioner

## 2019-03-05 ENCOUNTER — Encounter: Payer: Self-pay | Admitting: Adult Health Nurse Practitioner

## 2019-03-05 ENCOUNTER — Other Ambulatory Visit: Payer: Self-pay

## 2019-03-05 VITALS — BP 128/78 | HR 58 | Temp 97.4°F | Ht 63.0 in | Wt 152.6 lb

## 2019-03-05 DIAGNOSIS — S40021A Contusion of right upper arm, initial encounter: Secondary | ICD-10-CM

## 2019-03-05 DIAGNOSIS — R7309 Other abnormal glucose: Secondary | ICD-10-CM

## 2019-03-05 DIAGNOSIS — G250 Essential tremor: Secondary | ICD-10-CM | POA: Diagnosis not present

## 2019-03-05 DIAGNOSIS — E039 Hypothyroidism, unspecified: Secondary | ICD-10-CM

## 2019-03-05 DIAGNOSIS — I10 Essential (primary) hypertension: Secondary | ICD-10-CM

## 2019-03-05 DIAGNOSIS — Z9181 History of falling: Secondary | ICD-10-CM

## 2019-03-05 DIAGNOSIS — E782 Mixed hyperlipidemia: Secondary | ICD-10-CM

## 2019-03-05 DIAGNOSIS — E663 Overweight: Secondary | ICD-10-CM

## 2019-03-05 DIAGNOSIS — M85852 Other specified disorders of bone density and structure, left thigh: Secondary | ICD-10-CM | POA: Diagnosis not present

## 2019-03-05 NOTE — Progress Notes (Signed)
Assessment and Plan:  Dana [name] was seen today for acute visit.  Diagnoses and all orders for this visit:  Contusion of right upper extremity, initial encounter Discussed monitoring area May apply ice/heat Discussed Tylenol 1,000mg  Q8PRN for pain, no pain at this time Monitor area Discussed S&S to contact office Consider outpatient imaging with worsening or no improvement  At high risk for falls Discussed home setting and maintaining clear path to bathroom Discussed foot wear Discussed possible grip tape for transition of flooring.  Tremor, essential Continue medication  Essential hypertension -     CBC with Differential/Platelet -     COMPLETE METABOLIC PANEL WITH GFR  Hyperlipidemia, mixed -     Lipid panel  Hypothyroidism, unspecified type -     TSH  Osteopenia of left hip -     VITAMIN D 25 Hydroxy (Vit-D Deficiency, Fractures)  Other abnormal glucose -     Hemoglobin A1c  Overweight (BMI 25.0-29.9) -     Hemoglobin A1c   Patient schedule for physical next week.  Will draw labs today to reduce travel in community during [place].  Will schedule televideo visit via [employer] per patient request.  Follow up for complete physical in 6 months.    Further disposition pending results of labs. Discussed med's effects and SE's.   Over 30 minutes of exam, counseling, chart review, and critical decision making was performed.   Future Appointments  Date Time Provider [place]  03/05/2019 12:30 PM [name], NP [facility] None  03/11/2019  2:00 PM Unk Pinto, MD [facility] None  01/21/2020  2:00 PM [name], NP [facility] None    ------------------------------------------------------------------------------------------------------------------ HPI Dana [name] is pleasant 78 y.o. female presents for evaluation of right arm.  She reports three days ago she got up to use the bathroom in the middle of the night and she lost her footing.  She is not  sure if she slid on the transition between the carpet and linoleum in the bathroom.  She reports she was wearing house slippers with solid rubber sole.  She reports sliding down the the floor and ended sitting on her scale.  She thinks she hit her right arm on the vanity as she went to the floor.  Denies hitting her had or any LOC.  She denies any pain.  Reports the area distal to her elbow is tender if pressure is applied.  She denies any open areas, numbess or tingling to extremities.  Denies any pain with lifting.  She reports she is still able to do all of her activities and all self care in her daily routine.  Reports her children were concerned about her and wanted her to be checked.      Past Medical History:  Diagnosis Date  . Anemia   . Cataract   . Environmental allergies   . Gastric ulcer   . Heartburn   . Hiatal hernia   . Hyperlipidemia   . Hypertension   . Hypothyroidism   . Migraines   . Prediabetes   . Rash october 2012   all over abdomen and breasts  . Rheumatoid arthritis(714.0)   . Tremor, essential    right hand     Allergies  Allergen Reactions  . Codeine Nausea Only  . Mysoline [Primidone] Nausea Only and Other (See Comments)    Over-sedated    Current Outpatient Medications on File Prior to Visit  Medication Sig  . Cholecalciferol (VITAMIN D) 2000 UNITS CAPS Take by mouth. Take  5 capsules by mouth once daily  . Cyanocobalamin 500 MCG/0.1ML SOLN Place 1 mL under the tongue daily.   . diazepam (VALIUM) 5 MG tablet Take 1 tablet 3 x /day for Tremor  . folic acid (FOLVITE) 824 MCG tablet Take 800 mcg by mouth daily.   . furosemide (LASIX) 40 MG tablet TAKE 1 TABLET BY MOUTH 2  TIMES DAILY FOR FLUID  RETENTION AND ANKLE  SWELLING  . hydroxychloroquine (PLAQUENIL) 200 MG tablet TAKE 1 TABLET BY MOUTH  TWICE A DAY  . levothyroxine (SYNTHROID, LEVOTHROID) 100 MCG tablet TAKE 1 TABLET BY MOUTH  DAILY  . methotrexate (RHEUMATREX) 2.5 MG tablet Take 5 tablets on  Tues and 5 tablets on Wed  . montelukast (SINGULAIR) 10 MG tablet TAKE 1 TABLET BY MOUTH  DAILY  . ondansetron (ZOFRAN ODT) 8 MG disintegrating tablet 8mg  ODT q4 hours prn nausea  . pravastatin (PRAVACHOL) 40 MG tablet TAKE 1 TABLET BY MOUTH  DAILY  . propranolol (INDERAL) 80 MG tablet TAKE 1 TABLET BY MOUTH 3  TIMES DAILY  . traMADol (ULTRAM) 50 MG tablet Take 1 tablet (50 mg total) by mouth 4 (four) times daily. (Patient taking differently: Take 50 mg by mouth as needed. )  . triamcinolone ointment (KENALOG) 0.5 % Apply 1 application topically 2 (two) times daily.   No current facility-administered medications on file prior to visit.     ROS: all negative except above.   Physical Exam:  There were no vitals taken for this visit.  General Appearance: Well nourished, in no apparent distress. Wearing mask to appointment today. Eyes: PERRLA, EOMs, conjunctiva no swelling or erythema Respiratory: Respiratory effort normal, BS equal bilaterally without rales, rhonchi, wheezing or stridor.  Cardio: RRR with no MRGs. Brisk peripheral pulses without edema.  Abdomen: Soft, + BS.  Non tender, no guarding, rebound, hernias, masses. Lymphatics: Non tender without lymphadenopathy.  Musculoskeletal: Full ROM, 5/5 strength, normal gait. Fibrous moveable pea size nodule, tender to palpation, Flexor carpi ulnaris, proximal. Skin: Warm, dry without rashes, lesions.  Ecchymosis noted to posterior forearm from proximal ulna extending 6cm distally by 10cm.   Neuro: Cranial nerves intact. Normal muscle tone, no cerebellar symptoms. Sensation intact.  Mild head tremor noted, baseline. Psych: Awake and oriented X 3, normal affect, Insight and Judgment appropriate.     [name], NP 11:31 AM [facility]

## 2019-03-05 NOTE — Patient Instructions (Addendum)
Monitor elbow for increasing redness and or pain.  You may use ice to the area.  For any discomfort you can use Tylenol, acetaminophen 500mg , two tablets, 1,000mg  every 8 hours.  Today your symptoms are suggestive of contusion.  If worsening or new symptoms we can discussed outpatient X-ray at imaging center.  You have Osteopenia, weak bones, and sometimes a small hairline fracture can occur.   The following are reasons to contact our office:  Immediate, sharp wrist pain  Wrist swelling and tenderness, Deformity of the forearm or wrist.  Numbness and/or inability to move the wrist or hand Inability to perform gripping or squeezing actions    Contusion  A contusion is a deep bruise. Contusions happen when an injury causes bleeding under the skin. Symptoms of bruising include pain, swelling, and discolored skin. The skin may turn blue, purple, or yellow. Follow these instructions at home:  Rest the injured area.  If told, put ice on the injured area. ? Put ice in a plastic bag. ? Place a towel between your skin and the bag. ? Leave the ice on for 20 minutes, 2-3 times per day.  If told, put light pressure (compression) on the injured area using an elastic bandage. Make sure the bandage is not too tight. Remove it and put it back on as told by your doctor.  If possible, raise (elevate) the injured area above the level of your heart while you are sitting or lying down.  Take over-the-counter and prescription medicines only as told by your doctor. Contact a doctor if:  Your symptoms do not get better after several days of treatment.  Your symptoms get worse.  You have trouble moving the injured area. Get help right away if:  You have very bad pain.  You have a loss of feeling (numbness) in a hand or foot.  Your hand or foot turns pale or cold. This information is not intended to replace advice given to you by your health care provider. Make sure you discuss any questions  you have with your health care provider. Document Released: 04/25/2008 Document Revised: 04/14/2016 Document Reviewed: 03/25/2015 Elsevier Interactive Patient Education  2019 [employer].

## 2019-03-06 LAB — CBC WITH DIFFERENTIAL/PLATELET
Absolute Monocytes: 484 cells/uL (ref 200–950)
Basophils Absolute: 29 cells/uL (ref 0–200)
Basophils Relative: 0.7 %
Eosinophils Absolute: 62 {cells}/uL (ref 15–500)
Eosinophils Relative: 1.5 %
HCT: 34.1 % — ABNORMAL LOW (ref 35.0–45.0)
Hemoglobin: 11.4 g/dL — ABNORMAL LOW (ref 11.7–15.5)
Lymphs Abs: 1324 cells/uL (ref 850–3900)
MCH: 32.1 pg (ref 27.0–33.0)
MCHC: 33.4 g/dL (ref 32.0–36.0)
MCV: 96.1 fL (ref 80.0–100.0)
MPV: 10.2 fL (ref 7.5–12.5)
Monocytes Relative: 11.8 %
Neutro Abs: 2202 cells/uL (ref 1500–7800)
Neutrophils Relative %: 53.7 %
Platelets: 248 10*3/uL (ref 140–400)
RBC: 3.55 10*6/uL — ABNORMAL LOW (ref 3.80–5.10)
RDW: 14.7 % (ref 11.0–15.0)
Total Lymphocyte: 32.3 %
WBC: 4.1 10*3/uL (ref 3.8–10.8)

## 2019-03-06 LAB — LIPID PANEL
Cholesterol: 142 mg/dL (ref <200)
HDL: 65 mg/dL (ref >=50)
LDL Cholesterol (Calc): 60 mg/dL (calc)
Non-HDL Cholesterol (Calc): 77 mg/dL (calc) (ref <130)
Total CHOL/HDL Ratio: 2.2 (calc) (ref <5.0)
Triglycerides: 85 mg/dL (ref <150)

## 2019-03-06 LAB — COMPLETE METABOLIC PANEL WITH GFR
AG Ratio: 1.8 (calc) (ref 1.0–2.5)
ALT: 10 U/L (ref 6–29)
AST: 21 U/L (ref 10–35)
Albumin: 3.7 g/dL (ref 3.6–5.1)
Alkaline phosphatase (APISO): 71 U/L (ref 37–153)
BUN: 9 mg/dL (ref 7–25)
CO2: 33 mmol/L — ABNORMAL HIGH (ref 20–32)
Calcium: 9.1 mg/dL (ref 8.6–10.4)
Chloride: 106 mmol/L (ref 98–110)
Creat: 0.81 mg/dL (ref 0.60–0.93)
GFR, Est African American: 80 mL/min/{1.73_m2} (ref >=60)
GFR, Est Non African American: 69 mL/min/{1.73_m2} (ref >=60)
Globulin: 2.1 g/dL (calc) (ref 1.9–3.7)
Glucose, Bld: 83 mg/dL (ref 65–99)
Potassium: 4.1 mmol/L (ref 3.5–5.3)
Sodium: 144 mmol/L (ref 135–146)
Total Bilirubin: 0.5 mg/dL (ref 0.2–1.2)
Total Protein: 5.8 g/dL — ABNORMAL LOW (ref 6.1–8.1)

## 2019-03-06 LAB — TSH: TSH: 2.16 mIU/L (ref 0.40–4.50)

## 2019-03-06 LAB — VITAMIN D 25 HYDROXY (VIT D DEFICIENCY, FRACTURES): Vit D, 25-Hydroxy: 60 ng/mL (ref 30–100)

## 2019-03-06 LAB — HEMOGLOBIN A1C
Hgb A1c MFr Bld: 5.3 % of total Hgb (ref <5.7)
Mean Plasma Glucose: 105 (calc)
eAG (mmol/L): 5.8 (calc)

## 2019-03-11 ENCOUNTER — Encounter: Payer: Self-pay | Admitting: Internal Medicine

## 2019-03-11 NOTE — Progress Notes (Addendum)
Virtual Visit via Telephone Note  I connected with Dana [name] on 03/14/19 at 12:30 PM EDT by telephone and verified that I am speaking with the correct person using two identifiers.   I discussed the limitations of evaluation and management by telemedicine and the availability of in person appointments. The patient expressed understanding and agreed to proceed.    3 Month Follow Up   Assessment and Plan:    Dana [name] was seen today for follow-up.  Diagnoses and all orders for this visit:   Essential hypertension Doing well continue Current regiment Will continue to monitor Low sodium diet  Hyperlipidemia, mixed Taking Praavachol 40mg  Discussed dietary and exercise modifications Will check lipids at next visit  Hypothyroidism, unspecified type -     TSH Taking levothyroxine 164mcg one tablet 5 days a week and half tablet, 2mcg, two days a week.  Tremor, essential Taking Inderol and valium for this Doing well Managing, no complications at this time.  Other abnormal glucose Discussed dietary and exercise modifications Will monitor A1c next visit  Irritant contact dermatitis due to other agents Improved from last visit -     triamcinolone ointment (KENALOG) 0.5 %; Apply 1 application topically 2 (two) times daily. Monitor for triggers  Osteopenia of left hip Continue Calcium and Vit D supplimentation DEXA UTD  Vitamin D deficiency Continue supplimentation  Non-seasonal allergic rhinitis, unspecified trigger Taking singular Doing well at this time  Contusion of right upper extremity, sequela Discussed monitoring area May apply ice/heat Discussed Tylenol 1,000mg  Q8PRN for pain, no pain at this time Monitor area Discussed S&S to contact office Consider outpatient imaging with worsening or no improvement  At high risk for falls Discussed home setting and maintaining clear path to bathroom Discussed foot wear Discussed possible grip tape for  transition of flooring.  Medication management Ongoing   Follow Up Instructions:  I discussed the assessment and treatment plan with the patient. The patient was provided an opportunity to ask questions and all were answered. The patient agreed with the plan and demonstrated an understanding of the instructions.   The patient was advised to call back or seek an in-person evaluation if the symptoms worsen or if the condition fails to improve as anticipated.  I provided 30 minutes of non-face-to-face time during this encounter including counseling, chart review, and critical decision making was preformed.   Future Appointments  Date Time Provider [place]  09/04/2019 11:00 AM Unk Pinto, MD [facility] None  01/21/2020  2:00 PM [name], NP [facility] None    ----------------------------------------------------------------------------------------------------------------------  HPI  Dana [name] is a 78 y.o. female who presents for 3 month follow up for HTN, HLD, Hypothyroidism, Pre-Diabetes, Vitamin D Defciency, RA, and hereditary essential tremor.              Patient diagnosed with RA in 2001 and stable on MTX and plaquenil and follows with [facility] She has an essential tremor and taking propanolol for this 80mg  TID. She has some hoarseness today and reports a scratchy throat.  She said she was outside the day prior and believes that the pollen is causing the irritation.  Denies any sore throat, watery eyes, otalgia, cough, shortness of breath or wheezing.  Last visit was acute for contusion to right upper extremity.  She reports the bruising has resolved.  She reports she still have two firm spots under her skin.  Tender with palpation.  Denies any pain in using her arm or completing any of her daily  activities.  Denies any numbness or tingling, loss of strength or change in her motor control.  Labs drawn from previous visit reviewed with patient over the  phone.  BMI is Body mass index is 26.93 kg/m., she has been working on diet and exercise. Wt Readings from Last 3 Encounters:  03/12/19 152 lb (68.9 kg)  03/05/19 152 lb 9.6 oz (69.2 kg)  01/22/19 153 lb 12.8 oz (69.8 kg)    She has had elevated blood pressure since 1998. Her blood pressure has been controlled at home, today their BP is BP: 124/60 She does workout. She denies chest pain, shortness of breath, dizziness or edema..    She is on cholesterol medication Pravastatin and denies myalgias. Her cholesterol is at goal. The cholesterol last visit was:   Lab Results  Component Value Date   CHOL 142 03/05/2019   HDL 65 03/05/2019   LDLCALC 60 03/05/2019   TRIG 85 03/05/2019   CHOLHDL 2.2 03/05/2019    She has been working on diet and exercise for prediabetes, and denies hyperglycemia, hypoglycemia , increased appetite, nausea, paresthesia of the feet, polydipsia, polyuria, visual disturbances, vomiting and weight loss. Last A1C in the office was:  Lab Results  Component Value Date   HGBA1C 5.3 03/05/2019   Patient is on Vitamin D supplement.   Lab Results  Component Value Date   VD25OH 60 03/05/2019       Current Medications:  Current Outpatient Medications on File Prior to Visit  Medication Sig  . Cholecalciferol (VITAMIN D) 2000 UNITS CAPS Take by mouth. Take 5 capsules by mouth once daily  . Cyanocobalamin 500 MCG/0.1ML SOLN Place 1 mL under the tongue daily.   . diazepam (VALIUM) 5 MG tablet Take 1 tablet 3 x /day for Tremor  . folic acid (FOLVITE) 160 MCG tablet Take 800 mcg by mouth daily.   . furosemide (LASIX) 40 MG tablet TAKE 1 TABLET BY MOUTH 2  TIMES DAILY FOR FLUID  RETENTION AND ANKLE  SWELLING  . hydroxychloroquine (PLAQUENIL) 200 MG tablet TAKE 1 TABLET BY MOUTH  TWICE A DAY  . methotrexate (RHEUMATREX) 2.5 MG tablet Take 5 tablets on Tues and 5 tablets on Wed  . montelukast (SINGULAIR) 10 MG tablet TAKE 1 TABLET BY MOUTH  DAILY  . ondansetron (ZOFRAN ODT)  8 MG disintegrating tablet 8mg  ODT q4 hours prn nausea  . pravastatin (PRAVACHOL) 40 MG tablet TAKE 1 TABLET BY MOUTH  DAILY  . propranolol (INDERAL) 80 MG tablet TAKE 1 TABLET BY MOUTH 3  TIMES DAILY  . traMADol (ULTRAM) 50 MG tablet Take 1 tablet (50 mg total) by mouth 4 (four) times daily. (Patient taking differently: Take 50 mg by mouth as needed. )  . triamcinolone ointment (KENALOG) 0.5 % Apply 1 application topically 2 (two) times daily.   No current facility-administered medications on file prior to visit.     Allergies:  Allergies  Allergen Reactions  . Codeine Nausea Only  . Mysoline [Primidone] Nausea Only and Other (See Comments)    Over-sedated     Medical History:  Past Medical History:  Diagnosis Date  . Anemia   . Cataract   . Environmental allergies   . Gastric ulcer   . Heartburn   . Hiatal hernia   . Hyperlipidemia   . Hypertension   . Hypothyroidism   . Migraines   . Prediabetes   . Rash october 2012   all over abdomen and breasts  . Rheumatoid arthritis(714.0)   .  Tremor, essential    right hand    Family history- Reviewed and unchanged   Social history- Reviewed and unchanged   Names of Other Physician/Practitioners you currently use: 1. [facility] here for primary care 2. Eye Exam, 2020 3. Dentist, Due Patient Care Team: Unk Pinto, MD as PCP - General (Internal Medicine) Roetta Sessions, MD as Referring Physician (Internal Medicine)   Screening Tests: Immunization History  Administered Date(s) Administered  . DT 02/05/2015  . Influenza Split 09/12/2013  . Influenza, High Dose Seasonal PF 10/28/2014, 08/18/2016, 11/22/2018  . Pneumococcal Conjugate-13 05/12/2015  . Pneumococcal-Unspecified 11/21/2006  . Td 11/22/2003  . Varicella 06/23/2011  . Zoster 06/23/2011      Preventative care: Last colonoscopy: 2013 Last mammogram: 08/2018, scheduled for 2020 Last pap smear/pelvic  exam: Hysterectomy   DEXA: 08/2018  Prior vaccinations: TD or Tdap: 2016         Influenza: 2019 Pneumococcal: 2008 Prevnar13: 2016 Shingles/Zostavax: 2017 Hep A, B & C - Negative 07/2018   Review of Systems:  Review of Systems  Constitutional: Negative for chills, diaphoresis, fever, malaise/fatigue and weight loss.  HENT: Negative for congestion, ear discharge, ear pain, hearing loss, nosebleeds, sinus pain, sore throat and tinnitus.        Hoarseness noted.  Eyes: Negative for blurred vision, double vision, photophobia, pain, discharge and redness.  Respiratory: Negative for cough, hemoptysis, sputum production, shortness of breath, wheezing and stridor.   Cardiovascular: Negative for chest pain, palpitations, orthopnea, claudication, leg swelling and PND.  Gastrointestinal: Negative for abdominal pain, blood in stool, constipation, diarrhea, heartburn, melena, nausea and vomiting.  Genitourinary: Negative for dysuria, flank pain, frequency, hematuria and urgency.  Musculoskeletal: Negative for back pain, falls, joint pain, myalgias and neck pain.  Skin: Negative for itching and rash.  Neurological: Negative for dizziness, tingling, tremors, sensory change, speech change, focal weakness, seizures, loss of consciousness, weakness and headaches.  Endo/Heme/Allergies: Negative for environmental allergies and polydipsia. Does not bruise/bleed easily.  Psychiatric/Behavioral: Negative for depression, hallucinations, memory loss, substance abuse and suicidal ideas. The patient is not nervous/anxious and does not have insomnia.       Ht 5\' 3"  (1.6 m)   Wt 152 lb (68.9 kg)   BMI 26.93 kg/m  Wt Readings from Last 3 Encounters:  03/12/19 152 lb (68.9 kg)  03/05/19 152 lb 9.6 oz (69.2 kg)  01/22/19 153 lb 12.8 oz (69.8 kg)   Observations:  General : Well sounding patient in no apparent distress HEENT: Some hoarseness noted.  Patients baseline speech is not fluent related to  tremors,  no cough for duration of visit Lungs: speaks in complete sentences, no audible wheezing, no apparent distress Neurological: alert, oriented x 3 Psychiatric: pleasant, judgement appropriate    [name], NP [facility] 03/12/19         1:15 PM

## 2019-03-12 ENCOUNTER — Other Ambulatory Visit: Payer: Self-pay

## 2019-03-12 ENCOUNTER — Encounter: Payer: Self-pay | Admitting: Adult Health Nurse Practitioner

## 2019-03-12 ENCOUNTER — Ambulatory Visit: Payer: Medicare Other | Admitting: Adult Health Nurse Practitioner

## 2019-03-12 VITALS — Ht 63.0 in | Wt 152.0 lb

## 2019-03-12 DIAGNOSIS — E039 Hypothyroidism, unspecified: Secondary | ICD-10-CM

## 2019-03-12 DIAGNOSIS — M85852 Other specified disorders of bone density and structure, left thigh: Secondary | ICD-10-CM

## 2019-03-12 DIAGNOSIS — E782 Mixed hyperlipidemia: Secondary | ICD-10-CM

## 2019-03-12 DIAGNOSIS — E559 Vitamin D deficiency, unspecified: Secondary | ICD-10-CM

## 2019-03-12 DIAGNOSIS — L2489 Irritant contact dermatitis due to other agents: Secondary | ICD-10-CM

## 2019-03-12 DIAGNOSIS — I10 Essential (primary) hypertension: Secondary | ICD-10-CM | POA: Diagnosis not present

## 2019-03-12 DIAGNOSIS — J3089 Other allergic rhinitis: Secondary | ICD-10-CM

## 2019-03-12 DIAGNOSIS — Z9181 History of falling: Secondary | ICD-10-CM

## 2019-03-12 DIAGNOSIS — S40021S Contusion of right upper arm, sequela: Secondary | ICD-10-CM

## 2019-03-12 DIAGNOSIS — R7309 Other abnormal glucose: Secondary | ICD-10-CM

## 2019-03-12 DIAGNOSIS — G250 Essential tremor: Secondary | ICD-10-CM | POA: Diagnosis not present

## 2019-03-12 MED ORDER — LEVOTHYROXINE SODIUM 100 MCG PO TABS: ORAL_TABLET | ORAL | 3 refills | Status: DC

## 2019-03-12 MED ORDER — LEVOTHYROXINE SODIUM 100 MCG PO TABS: ORAL_TABLET | ORAL | 1 refills | Status: DC

## 2019-03-14 ENCOUNTER — Encounter: Payer: Self-pay | Admitting: Adult Health Nurse Practitioner

## 2019-03-14 NOTE — Patient Instructions (Addendum)
03/12/19 you had a video visit with Dana Sierras, DNP.  Below is a summary of your visit.   Your health screenings and immunizations are up to date.  Continue to monitor your right arm for any new or worsening symptoms.  The knots should resolve over time.  Please contact the office with any new pain to the area, difficulty using your right arm, numbness or tingling.  09/04/19 at 11:00am you have an appointment with Dr Dana [name] for your Physical.    >>>>>>>>>>>>>>>>>>>>>>>>>>>>>>>>>>>>>>>>>>>>>>>>>>>>>>> Coronavirus (COVID-19) Are you at risk?  Are you at risk for the Coronavirus (COVID-19)?  To be considered HIGH RISK for Coronavirus (COVID-19), you have to meet the following criteria:  . Traveled to [place], [place], [place], [place] or [place]; or in the [place] to [place], [place], [place]  . or [place]; and have fever, cough, and shortness of breath within the last 2 weeks of travel OR . Been in close contact with a person diagnosed with COVID-19 within the last 2 weeks and have  . fever, cough,and shortness of breath .  . IF YOU DO NOT MEET THESE CRITERIA, YOU ARE CONSIDERED LOW RISK FOR COVID-19.  What to do if you are HIGH RISK for COVID-19?  Marland Kitchen If you are having a medical emergency, call 911. . Seek medical care right away. Before you go to a doctor's office, urgent care or emergency department, .  call ahead and tell them about your recent travel, contact with someone diagnosed with COVID-19  .  and your symptoms.  . You should receive instructions from your physician's office regarding next steps of care.  . When you arrive at healthcare provider, tell the healthcare staff immediately you have returned from  . visiting [place], [place], [place], [place] or [place]; or traveled in the [place] to [place], [place],  . [place] or [place] in the last two weeks or you have been in close contact with a person diagnosed with  . COVID-19 in the last 2  weeks.   . Tell the health care staff about your symptoms: fever, cough and shortness of breath. . After you have been seen by a medical provider, you will be either: o Tested for (COVID-19) and discharged home on quarantine except to seek medical care if  o symptoms worsen, and asked to  - Stay home and avoid contact with others until you get your results (4-5 days)  - Avoid travel on public transportation if possible (such as bus, train, or airplane) or o Sent to the Emergency Department by EMS for evaluation, COVID-19 testing  and  o possible admission depending on your condition and test results.  What to do if you are LOW RISK for COVID-19?  Reduce your risk of any infection by using the same precautions used for avoiding the common cold or flu:  Marland Kitchen Wash your hands often with soap and warm water for at least 20 seconds.  If soap and water are not readily available,  . use an alcohol-based hand sanitizer with at least 60% alcohol.  . If coughing or sneezing, cover your mouth and nose by coughing or sneezing into the elbow areas of your shirt or coat, .  into a tissue or into your sleeve (not your hands). . Avoid shaking hands with others and consider head nods or verbal greetings only. . Avoid touching your eyes, nose, or mouth with unwashed hands.  . Avoid close contact with people who are sick. Marland Kitchen  Avoid places or events with large numbers of people in one location, like concerts or sporting events. . Carefully consider travel plans you have or are making. . If you are planning any travel outside or inside the [place], visit the CDC's Travelers' Health webpage for the latest health notices. . If you have some symptoms but not all symptoms, continue to monitor at home and seek medical attention  . if your symptoms worsen. . If you are having a medical emergency, call 911.   . >>>>>>>>>>>>>>>>>>>>>>>>>>>>>>>>> . We Do NOT Approve of  [facility], [employer]  Our Patients   To Do Home Visits & We Do NOT Approve of [employer] > > > > > > > > > > > > > > > > > > > > > > > > > > > > > > > > > > > > > > >  Preventive Care for Adults  A healthy lifestyle and preventive care can promote health and wellness. Preventive health guidelines for women include the following key practices.  A routine yearly physical is a good way to check with your health care provider about your health and preventive screening. It is a chance to share any concerns and updates on your health and to receive a thorough exam.  Visit your dentist for a routine exam and preventive care every 6 months. Brush your teeth twice a day and floss once a day. Good oral hygiene prevents tooth decay and gum disease.  The frequency of eye exams is based on your age, health, family medical history, use of contact lenses, and other factors. Follow your health care provider's recommendations for frequency of eye exams.  Eat a healthy diet. Foods like vegetables, fruits, whole grains, low-fat dairy products, and lean protein foods contain the nutrients you need without too many calories. Decrease your intake of foods high in solid fats, added sugars, and salt. Eat the right amount of calories for you. Get information about a proper diet from your health care provider, if necessary.  Regular physical exercise is one of the most important things you can do for your health. Most adults should get at least 150 minutes of moderate-intensity exercise (any activity that increases your heart rate and causes you to sweat) each week. In addition, most adults need muscle-strengthening exercises on 2 or more days a week.  Maintain a healthy weight. The body mass index (BMI) is a screening tool to identify possible weight problems. It provides an estimate of body fat based on height and weight. Your health care provider can find [name] and can help you achieve or maintain a healthy weight. For adults 20 years and  older:  A BMI below 18.5 is considered underweight.  A BMI of 18.5 to 24.9 is normal.  A BMI of 25 to 29.9 is considered overweight.  A BMI of 30 and above is considered obese.  Maintain normal blood lipids and cholesterol levels by exercising and minimizing your intake of saturated fat. Eat a balanced diet with plenty of fruit and vegetables. If your lipid or cholesterol levels are high, you are over 50, or you are at high risk for heart disease, you may need your cholesterol levels checked more frequently. Ongoing high lipid and cholesterol levels should be treated with medicines if diet and exercise are not working.  If you smoke, find out from your health care provider how to quit. If you do not use tobacco, do not start.  Lung  cancer screening is recommended for adults aged 93-80 years who are at high risk for developing lung cancer because of a history of smoking. A yearly low-dose CT scan of the lungs is recommended for people who have at least a 30-pack-year history of smoking and are a current smoker or have quit within the past 15 years. A pack year of smoking is smoking an average of 1 pack of cigarettes a day for 1 year (for example: 1 pack a day for 30 years or 2 packs a day for 15 years). Yearly screening should continue until the smoker has stopped smoking for at least 15 years. Yearly screening should be stopped for people who develop a health problem that would prevent them from having lung cancer treatment.  Avoid use of street drugs. Do not share needles with anyone. Ask for help if you need support or instructions about stopping the use of drugs.  High blood pressure causes heart disease and increases the risk of stroke.  Ongoing high blood pressure should be treated with medicines if weight loss and exercise do not work.  If you are 7-67 years old, ask your health care provider if you should take aspirin to prevent strokes.  Diabetes screening involves taking a blood  sample to check your fasting blood sugar level. This should be done once every 3 years, after age 34, if you are within normal weight and without risk factors for diabetes. Testing should be considered at a younger age or be carried out more frequently if you are overweight and have at least 1 risk factor for diabetes.  Breast cancer screening is essential preventive care for women. You should practice "breast self-awareness." This means understanding the normal appearance and feel of your breasts and may include breast self-examination. Any changes detected, no matter how small, should be reported to a health care provider. Women in their 71s and 30s should have a clinical breast exam (CBE) by a health care provider as part of a regular health exam every 1 to 3 years. After age 81, women should have a CBE every year. Starting at age 59, women should consider having a mammogram (breast X-ray test) every year. Women who have a family history of breast cancer should talk to their health care provider about genetic screening. Women at a high risk of breast cancer should talk to their health care providers about having an MRI and a mammogram every year.  Breast cancer gene (BRCA)-related cancer risk assessment is recommended for women who have family members with BRCA-related cancers. BRCA-related cancers include breast, ovarian, tubal, and peritoneal cancers. Having family members with these cancers may be associated with an increased risk for harmful changes (mutations) in the breast cancer genes BRCA1 and BRCA2. Results of the assessment will determine the need for genetic counseling and BRCA1 and BRCA2 testing.  Routine pelvic exams to screen for cancer are no longer recommended for nonpregnant women who are considered low risk for cancer of the pelvic organs (ovaries, uterus, and vagina) and who do not have symptoms. Ask your health care provider if a screening pelvic exam is right for you.  If you have had  past treatment for cervical cancer or a condition that could lead to cancer, you need Pap tests and screening for cancer for at least 20 years after your treatment. If Pap tests have been discontinued, your risk factors (such as having a new sexual partner) need to be reassessed to determine if screening should be resumed. Some  women have medical problems that increase the chance of getting cervical cancer. In these cases, your health care provider may recommend more frequent screening and Pap tests.    Colorectal cancer can be detected and often prevented. Most routine colorectal cancer screening begins at the age of 50 years and continues through age 35 years. However, your health care provider may recommend screening at an earlier age if you have risk factors for colon cancer. On a yearly basis, your health care provider may provide home test kits to check for hidden blood in the stool. Use of a small camera at the end of a tube, to directly examine the colon (sigmoidoscopy or colonoscopy), can detect the earliest forms of colorectal cancer. Talk to your health care provider about this at age 4, when routine screening begins.  Direct exam of the colon should be repeated every 5-10 years through age 64 years, unless early forms of pre-cancerous polyps or small growths are found.  Osteoporosis is a disease in which the bones lose minerals and strength with aging. This can result in serious bone fractures or breaks. The risk of osteoporosis can be identified using a bone density scan. Women ages 70 years and over and women at risk for fractures or osteoporosis should discuss screening with their health care providers. Ask your health care provider whether you should take a calcium supplement or vitamin D to reduce the rate of osteoporosis.  Menopause can be associated with physical symptoms and risks. Hormone replacement therapy is available to decrease symptoms and risks. You should talk to your health  care provider about whether hormone replacement therapy is right for you.  Use sunscreen. Apply sunscreen liberally and repeatedly throughout the day. You should seek shade when your shadow is shorter than you. Protect yourself by wearing long sleeves, pants, a wide-brimmed hat, and sunglasses year round, whenever you are outdoors.  Once a month, do a whole body skin exam, using a mirror to look at the skin on your back. Tell your health care provider of new moles, moles that have irregular borders, moles that are larger than a pencil eraser, or moles that have changed in shape or color.  Stay current with required vaccines (immunizations).  Influenza vaccine. All adults should be immunized every year.  Tetanus, diphtheria, and acellular pertussis (Td, Tdap) vaccine. Pregnant women should receive 1 dose of Tdap vaccine during each pregnancy. The dose should be obtained regardless of the length of time since the last dose. Immunization is preferred during the 27th-36th week of gestation. An adult who has not previously received Tdap or who does not know her vaccine status should receive 1 dose of Tdap. This initial dose should be followed by tetanus and diphtheria toxoids (Td) booster doses every 10 years. Adults with an unknown or incomplete history of completing a 3-dose immunization series with Td-containing vaccines should begin or complete a primary immunization series including a Tdap dose. Adults should receive a Td booster every 10 years.    Zoster vaccine. One dose is recommended for adults aged 28 years or older unless certain conditions are present.    Pneumococcal 13-valent conjugate (PCV13) vaccine. When indicated, a person who is uncertain of her immunization history and has no record of immunization should receive the PCV13 vaccine. An adult aged 49 years or older who has certain medical conditions and has not been previously immunized should receive 1 dose of PCV13 vaccine. This  PCV13 should be followed with a dose of pneumococcal polysaccharide (  PPSV23) vaccine. The PPSV23 vaccine dose should be obtained at least 1 or more year(s) after the dose of PCV13 vaccine. An adult aged 51 years or older who has certain medical conditions and previously received 1 or more doses of PPSV23 vaccine should receive 1 dose of PCV13. The PCV13 vaccine dose should be obtained 1 or more years after the last PPSV23 vaccine dose.    Pneumococcal polysaccharide (PPSV23) vaccine. When PCV13 is also indicated, PCV13 should be obtained first. All adults aged 40 years and older should be immunized. An adult younger than age 39 years who has certain medical conditions should be immunized. Any person who resides in a nursing home or long-term care facility should be immunized. An adult smoker should be immunized. People with an immunocompromised condition and certain other conditions should receive both PCV13 and PPSV23 vaccines. People with human immunodeficiency virus (HIV) infection should be immunized as soon as possible after diagnosis. Immunization during chemotherapy or radiation therapy should be avoided. Routine use of PPSV23 vaccine is not recommended for American Indians, [place] Natives, or people younger than 65 years unless there are medical conditions that require PPSV23 vaccine. When indicated, people who have unknown immunization and have no record of immunization should receive PPSV23 vaccine. One-time revaccination 5 years after the first dose of PPSV23 is recommended for people aged 19-64 years who have chronic kidney failure, nephrotic syndrome, asplenia, or immunocompromised conditions. People who received 1-2 doses of PPSV23 before age 64 years should receive another dose of PPSV23 vaccine at age 41 years or later if at least 5 years have passed since the previous dose. Doses of PPSV23 are not needed for people immunized with PPSV23 at or after age 108 years.   Preventive Services /  Frequency  Ages 49 years and over  Blood pressure check.  Lipid and cholesterol check.  Lung cancer screening. / Every year if you are aged 57-80 years and have a 30-pack-year history of smoking and currently smoke or have quit within the past 15 years. Yearly screening is stopped once you have quit smoking for at least 15 years or develop a health problem that would prevent you from having lung cancer treatment.  Clinical breast exam.** / Every year after age 31 years.   BRCA-related cancer risk assessment.** / For women who have family members with a BRCA-related cancer (breast, ovarian, tubal, or peritoneal cancers).  Mammogram.** / Every year beginning at age 19 years and continuing for as long as you are in good health. Consult with your health care provider.  Pap test.** / Every 3 years starting at age 14 years through age 37 or 17 years with 3 consecutive normal Pap tests. Testing can be stopped between 65 and 70 years with 3 consecutive normal Pap tests and no abnormal Pap or HPV tests in the past 10 years.  Fecal occult blood test (FOBT) of stool. / Every year beginning at age 60 years and continuing until age 58 years. You may not need to do this test if you get a colonoscopy every 10 years.  Flexible sigmoidoscopy or colonoscopy.** / Every 5 years for a flexible sigmoidoscopy or every 10 years for a colonoscopy beginning at age 43 years and continuing until age 31 years.  Hepatitis C blood test.** / For all people born from [birth year] through 1967 and any individual with known risks for hepatitis C.  Osteoporosis screening.** / A one-time screening for women ages 12 years and over and women at risk for fractures  or osteoporosis.  Skin self-exam. / Monthly.  Influenza vaccine. / Every year.  Tetanus, diphtheria, and acellular pertussis (Tdap/Td) vaccine.** / 1 dose of Td every 10 years.  Zoster vaccine.** / 1 dose for adults aged 35 years or older.  Pneumococcal 13-valent  conjugate (PCV13) vaccine.** / Consult your health care provider.  Pneumococcal polysaccharide (PPSV23) vaccine.** / 1 dose for all adults aged 33 years and older. Screening for abdominal aortic aneurysm (AAA)  by ultrasound is recommended for people who have history of high blood pressure or who are current or former smokers. ++++++++++++++++++++ Recommend Adult Low Dose Aspirin or  coated  Aspirin 81 mg daily  To reduce risk of Colon Cancer 20 %,  Skin Cancer 26 % ,  Melanoma 46%  and  Pancreatic cancer 60% ++++++++++++++++++++ Vitamin D goal  is between 70-100.  Please make sure that you are taking your Vitamin D as directed.  It is very important as a natural anti-inflammatory  helping hair, skin, and nails, as well as reducing stroke and heart attack risk.  It helps your bones and helps with mood. It also decreases numerous cancer risks so please take it as directed.  Low Vit D is associated with a 200-300% higher risk for CANCER  and 200-300% higher risk for HEART   ATTACK  &  STROKE.   .....................................Marland Kitchen It is also associated with higher death rate at younger ages,  autoimmune diseases like Rheumatoid arthritis, Lupus, Multiple Sclerosis.    Also many other serious conditions, like depression, Alzheimer's Dementia, infertility, muscle aches, fatigue, fibromyalgia - just to name a few. ++++++++++++++++++ Recommend the book "The END of DIETING" by Dr Excell Seltzer  & the book "The END of DIABETES " by Dr Excell Seltzer At [email or web address] - get book & Audio CD's    Being diabetic has a  300% increased risk for heart attack, stroke, cancer, and alzheimer- type vascular dementia. It is very important that you work harder with diet by avoiding all foods that are white. Avoid white rice (brown & wild rice is OK), white potatoes (sweetpotatoes in moderation is OK), White bread or wheat bread or anything made out of white flour like bagels, donuts, rolls, buns, biscuits,  cakes, pastries, cookies, pizza crust, and pasta (made from white flour & egg whites) - vegetarian pasta or spinach or wheat pasta is OK. Multigrain breads like Arnold's or Pepperidge [employer], or multigrain sandwich thins or flatbreads.  Diet, exercise and weight loss can reverse and cure diabetes in the early stages.  Diet, exercise and weight loss is very important in the control and prevention of complications of diabetes which affects every system in your body, ie. Brain - dementia/stroke, eyes - glaucoma/blindness, heart - heart attack/heart failure, kidneys - dialysis, stomach - gastric paralysis, intestines - malabsorption, nerves - severe painful neuritis, circulation - gangrene & loss of a leg(s), and finally cancer and Alzheimers.    I recommend avoid fried & greasy foods,  sweets/candy, white rice (brown or wild rice or Quinoa is OK), white potatoes (sweet potatoes are OK) - anything made from white flour - bagels, doughnuts, rolls, buns, biscuits,white and wheat breads, pizza crust and traditional pasta made of white flour & egg white(vegetarian pasta or spinach or wheat pasta is OK).  Multi-grain bread is OK - like multi-grain flat bread or sandwich thins. Avoid alcohol in excess. Exercise is also important.    Eat all the vegetables you want - avoid meat, especially red meat and dairy -  especially cheese.  Cheese is the most concentrated form of trans-fats which is the worst thing to clog up our arteries. Veggie cheese is OK which can be found in the fresh produce section at [employer] or [employer] or [employer]  +++++++++++++++++++ DASH Eating Plan  DASH stands for "Dietary Approaches to Stop Hypertension."   The DASH eating plan is a healthy eating plan that has been shown to reduce high blood pressure (hypertension). Additional health benefits may include reducing the risk of type 2 diabetes mellitus, heart disease, and stroke. The DASH eating plan may also help with weight loss. WHAT  DO I NEED TO KNOW ABOUT THE DASH EATING PLAN? For the DASH eating plan, you will follow these general guidelines:  Choose foods with a percent daily value for sodium of less than 5% (as listed on the food label).  Use salt-free seasonings or herbs instead of table salt or sea salt.  Check with your health care provider or pharmacist before using salt substitutes.  Eat lower-sodium products, often labeled as "lower sodium" or "no salt added."  Eat fresh foods.  Eat more vegetables, fruits, and low-fat dairy products.  Choose whole grains. Look for the word "whole" as the first word in the ingredient list.  Choose fish   Limit sweets, desserts, sugars, and sugary drinks.  Choose heart-healthy fats.  Eat veggie cheese   Eat more home-cooked food and less restaurant, buffet, and fast food.  Limit fried foods.  Cook foods using methods other than frying.  Limit canned vegetables. If you do use them, rinse them well to decrease the sodium.  When eating at a restaurant, ask that your food be prepared with less salt, or no salt if possible.                      WHAT FOODS CAN I EAT? Read Dr Fara Olden Fuhrman's books on The End of Dieting & The End of Diabetes  Grains Whole grain or whole wheat bread. Brown rice. Whole grain or whole wheat pasta. Quinoa, bulgur, and whole grain cereals. Low-sodium cereals. Corn or whole wheat flour tortillas. Whole grain cornbread. Whole grain crackers. Low-sodium crackers.  Vegetables Fresh or frozen vegetables (raw, steamed, roasted, or grilled). Low-sodium or reduced-sodium tomato and vegetable juices. Low-sodium or reduced-sodium tomato sauce and paste. Low-sodium or reduced-sodium canned vegetables.   Fruits All fresh, canned (in natural juice), or frozen fruits.  Protein Products  All fish and seafood.  Dried beans, peas, or lentils. Unsalted nuts and seeds. Unsalted canned beans.  Dairy Low-fat dairy products, such as skim or 1% milk, 2%  or reduced-fat cheeses, low-fat ricotta or cottage cheese, or plain low-fat yogurt. Low-sodium or reduced-sodium cheeses.  Fats and Oils Tub margarines without trans fats. Light or reduced-fat mayonnaise and salad dressings (reduced sodium). Avocado. Safflower, olive, or canola oils. Natural peanut or almond butter.  Other Unsalted popcorn and pretzels. The items listed above may not be a complete list of recommended foods or beverages. Contact your dietitian for more options.  +++++++++++++++  WHAT FOODS ARE NOT RECOMMENDED? Grains/ White flour or wheat flour White bread. White pasta. White rice. Refined cornbread. Bagels and croissants. Crackers that contain trans fat.  Vegetables  Creamed or fried vegetables. Vegetables in a . Regular canned vegetables. Regular canned tomato sauce and paste. Regular tomato and vegetable juices.  Fruits Dried fruits. Canned fruit in light or heavy syrup. Fruit juice.  Meat and Other Protein Products Meat in general -  RED meat & White meat.  Fatty cuts of meat. Ribs, chicken wings, all processed meats as bacon, sausage, bologna, salami, fatback, hot dogs, bratwurst and packaged luncheon meats.  Dairy Whole or 2% milk, cream, half-and-half, and cream cheese. Whole-fat or sweetened yogurt. Full-fat cheeses or blue cheese. Non-dairy creamers and whipped toppings. Processed cheese, cheese spreads, or cheese curds.  Condiments Onion and garlic salt, seasoned salt, table salt, and sea salt. Canned and packaged gravies. Worcestershire sauce. Tartar sauce. Barbecue sauce. Teriyaki sauce. Soy sauce, including reduced sodium. Steak sauce. Fish sauce. Oyster sauce. Cocktail sauce. Horseradish. Ketchup and mustard. Meat flavorings and tenderizers. Bouillon cubes. Hot sauce. Tabasco sauce. Marinades. Taco seasonings. Relishes.  Fats and Oils Butter, stick margarine, lard, shortening and bacon fat. Coconut, palm kernel, or palm oils. Regular salad  dressings.  Pickles and olives. Salted popcorn and pretzels.  The items listed above may not be a complete list of foods and beverages to avoid.

## 2019-03-18 ENCOUNTER — Telehealth: Payer: Self-pay

## 2019-03-18 NOTE — Telephone Encounter (Signed)
-----   Message from [name], NP sent at 03/14/2019 12:54 PM EDT ----- Regarding: After Visit summary and labs. Can you please print the After visit summary from 03/12/19 appointment and labs from 4/14//20 and mail them to the patient.  Thank you,           Danton Sewer

## 2019-03-18 NOTE — Telephone Encounter (Signed)
AVS & LAB results have been printed & mailed out to patient. April 27th 2020

## 2019-04-03 ENCOUNTER — Telehealth (HOSPITAL_COMMUNITY): Payer: Self-pay | Admitting: *Deleted

## 2019-04-03 NOTE — Telephone Encounter (Signed)
Patient given detailed instructions per Myocardial Perfusion Study Information Sheet for the test on 04/05/19 at 10:45. Patient notified to arrive 15 minutes early and that it is imperative to arrive on time for appointment to keep from having the test rescheduled.  If you need to cancel or reschedule your appointment, please call the office within 24 hours of your appointment. . Patient verbalized understanding.Dana [name]

## 2019-04-03 NOTE — Telephone Encounter (Signed)
err

## 2019-04-03 NOTE — Telephone Encounter (Signed)

## 2019-04-05 ENCOUNTER — Ambulatory Visit (HOSPITAL_COMMUNITY): Payer: Medicare Other | Attending: Internal Medicine

## 2019-04-05 ENCOUNTER — Ambulatory Visit (HOSPITAL_BASED_OUTPATIENT_CLINIC_OR_DEPARTMENT_OTHER): Payer: Medicare Other

## 2019-04-05 ENCOUNTER — Other Ambulatory Visit: Payer: Self-pay

## 2019-04-05 ENCOUNTER — Encounter (INDEPENDENT_AMBULATORY_CARE_PROVIDER_SITE_OTHER): Payer: Self-pay

## 2019-04-05 DIAGNOSIS — R0600 Dyspnea, unspecified: Secondary | ICD-10-CM | POA: Diagnosis not present

## 2019-04-05 DIAGNOSIS — R079 Chest pain, unspecified: Secondary | ICD-10-CM | POA: Insufficient documentation

## 2019-04-05 LAB — MYOCARDIAL PERFUSION IMAGING
LV dias vol: 66 mL (ref 46–106)
LV sys vol: 17 mL
Peak HR: 74 {beats}/min
Rest HR: 57 {beats}/min
SDS: 2
SRS: 0
SSS: 2
TID: 0.97

## 2019-04-05 LAB — ECHOCARDIOGRAM COMPLETE
Height: 63 in
Weight: 2432 oz

## 2019-04-05 MED ORDER — TECHNETIUM TC 99M TETROFOSMIN IV KIT
27.0000 | PACK | Freq: Once | INTRAVENOUS | Status: AC | PRN
  Administered 2019-04-05: 27 via INTRAVENOUS
  Filled 2019-04-05: qty 27

## 2019-04-05 MED ORDER — TECHNETIUM TC 99M TETROFOSMIN IV KIT
8.5000 | PACK | Freq: Once | INTRAVENOUS | Status: AC | PRN
  Administered 2019-04-05: 8.5 via INTRAVENOUS
  Filled 2019-04-05: qty 9

## 2019-04-05 MED ORDER — REGADENOSON 0.4 MG/5ML IV SOLN
0.4000 mg | Freq: Once | INTRAVENOUS | Status: AC
  Administered 2019-04-05: 0.4 mg via INTRAVENOUS

## 2019-05-17 DIAGNOSIS — H00019 Hordeolum externum unspecified eye, unspecified eyelid: Secondary | ICD-10-CM | POA: Diagnosis not present

## 2019-05-17 DIAGNOSIS — L820 Inflamed seborrheic keratosis: Secondary | ICD-10-CM | POA: Diagnosis not present

## 2019-05-17 DIAGNOSIS — X32XXXD Exposure to sunlight, subsequent encounter: Secondary | ICD-10-CM | POA: Diagnosis not present

## 2019-05-17 DIAGNOSIS — L570 Actinic keratosis: Secondary | ICD-10-CM | POA: Diagnosis not present

## 2019-05-17 DIAGNOSIS — D225 Melanocytic nevi of trunk: Secondary | ICD-10-CM | POA: Diagnosis not present

## 2019-05-23 DIAGNOSIS — M0579 Rheumatoid arthritis with rheumatoid factor of multiple sites without organ or systems involvement: Secondary | ICD-10-CM | POA: Diagnosis not present

## 2019-07-02 ENCOUNTER — Encounter: Payer: Self-pay | Admitting: Adult Health Nurse Practitioner

## 2019-07-02 ENCOUNTER — Ambulatory Visit (INDEPENDENT_AMBULATORY_CARE_PROVIDER_SITE_OTHER): Payer: Medicare Other | Admitting: Adult Health Nurse Practitioner

## 2019-07-02 ENCOUNTER — Other Ambulatory Visit: Payer: Self-pay

## 2019-07-02 VITALS — BP 106/60 | HR 56 | Temp 97.7°F | Ht 63.0 in | Wt 148.0 lb

## 2019-07-02 DIAGNOSIS — R590 Localized enlarged lymph nodes: Secondary | ICD-10-CM

## 2019-07-02 DIAGNOSIS — L739 Follicular disorder, unspecified: Secondary | ICD-10-CM

## 2019-07-02 DIAGNOSIS — I10 Essential (primary) hypertension: Secondary | ICD-10-CM

## 2019-07-02 NOTE — Progress Notes (Addendum)
Assessment and Plan:  Dana [name] was seen today for breast problem.  Diagnoses and all orders for this visit:  Acute folliculitis Left breast Apply mosit warm compress 3-4 times a day Monitor area  Lymphadenopathy, axillary Continue to monitor Due for mammogram Contact office with new or worsening symptoms Consider labs or imaging?  Essential hypertension Controlled today Monitors diet Taking propanolol, for tremors       Further disposition pending results of labs. Discussed med's effects and SE's.   Over 30 minutes of exam, counseling, chart review, and critical decision making was performed.   Future Appointments  Date Time Provider [place]  09/04/2019 11:00 AM Dana Pinto, MD [facility] None  09/09/2019  2:00 PM [facility] MM 2 [facility] CE  01/21/2020  2:00 PM Dana Canal, NP [facility] None    ------------------------------------------------------------------------------------------------------------------   HPI 78 y.o.female presents for evaluation of red area on breast.  She first noticed this one week ago.  Before the visual redness she felt an itchy area. She reports she thought it was bug bite.  She reports three days ago the area had what looked like a hole and white stuff in the middle.  Denies any discharge or drainage from the area.  She did not use any OTC products on the area or take any orla medication.  She also denies any pain.   Past Medical History:  Diagnosis Date  . Anemia   . Cataract   . Environmental allergies   . Gastric ulcer   . Heartburn   . Hiatal hernia   . Hyperlipidemia   . Hypertension   . Hypothyroidism   . Migraines   . Prediabetes   . Rash october 2012   all over abdomen and breasts  . Rheumatoid arthritis(714.0)   . Tremor, essential    right hand     Allergies  Allergen Reactions  . Codeine Nausea Only  . Mysoline [Primidone] Nausea Only and Other (See Comments)    Over-sedated    Current  Outpatient Medications on File Prior to Visit  Medication Sig  . Cholecalciferol (VITAMIN D) 2000 UNITS CAPS Take by mouth. Take 5 capsules by mouth once daily  . Cyanocobalamin 500 MCG/0.1ML SOLN Place 1 mL under the tongue daily.   . diazepam (VALIUM) 5 MG tablet Take 1 tablet 3 x /day for Tremor  . folic acid (FOLVITE) 224 MCG tablet Take 800 mcg by mouth daily.   . furosemide (LASIX) 40 MG tablet TAKE 1 TABLET BY MOUTH 2  TIMES DAILY FOR FLUID  RETENTION AND ANKLE  SWELLING  . hydroxychloroquine (PLAQUENIL) 200 MG tablet TAKE 1 TABLET BY MOUTH  TWICE A DAY  . levothyroxine (SYNTHROID) 100 MCG tablet TAKE 1 TABLET BY MOUTH  DAILY  . methotrexate (RHEUMATREX) 2.5 MG tablet Take 5 tablets on Tues and 5 tablets on Wed  . montelukast (SINGULAIR) 10 MG tablet TAKE 1 TABLET BY MOUTH  DAILY  . ondansetron (ZOFRAN ODT) 8 MG disintegrating tablet 8mg  ODT q4 hours prn nausea  . pravastatin (PRAVACHOL) 40 MG tablet TAKE 1 TABLET BY MOUTH  DAILY  . propranolol (INDERAL) 80 MG tablet TAKE 1 TABLET BY MOUTH 3  TIMES DAILY  . traMADol (ULTRAM) 50 MG tablet Take 1 tablet (50 mg total) by mouth 4 (four) times daily. (Patient taking differently: Take 50 mg by mouth as needed. )  . triamcinolone ointment (KENALOG) 0.5 % Apply 1 application topically 2 (two) times daily. (Patient taking differently: Apply 1 application topically as  needed. )   No current facility-administered medications on file prior to visit.     ROS: all negative except above.   Physical Exam:  BP 106/60   Pulse (!) 56   Temp 97.7 F (36.5 C)   Ht 5\' 3"  (1.6 m)   Wt 148 lb (67.1 kg)   SpO2 98%   BMI 26.22 kg/m   General Appearance: Well nourished, in no apparent distress. Eyes: PERRLA, EOMs, conjunctiva no swelling or erythema Sinuses: No Frontal/maxillary tenderness ENT/Mouth: Ext aud canals clear, TMs without erythema, bulging. No erythema, swelling, or exudate on post pharynx.  Tonsils not swollen or erythematous. Hearing  normal.  Neck: Supple, thyroid normal.  Respiratory: Respiratory effort normal, BS equal bilaterally without rales, rhonchi, wheezing or stridor.  Cardio: RRR with no MRGs. Brisk peripheral pulses without edema.  Abdomen: Soft, + BS.  Non tender, no guarding, rebound, hernias, masses. Lymphatics: Left axillary lymphadenopathy, non-tender. Remaining non tender without lymphadenopathy.  Breast: No open areas, no lumps masses noted.  Left single area of erythema slightly raised, central white head, superficial, 1o'clock edge of areola. No nipple discharge. Musculoskeletal: Full ROM, 5/5 strength, normal gait.  Skin: Warm, dry without rashes, lesions, ecchymosis.  Neuro: Mild tremor, head, at baseline.  Cranial nerves intact. Normal muscle tone, no cerebellar symptoms. Sensation intact.  Psych: Awake and oriented X 3, normal affect, Insight and Judgment appropriate.     Dana Sierras, NP 3:56 PM [facility]

## 2019-07-02 NOTE — Patient Instructions (Signed)
  Apply a warm moist compress to left breast 3-4 times a day.  Monitor area.  The compress should help bring it to the surface.  Monitor the area on your breast.  Call to schedule your Screening mammogram.  This is covered by your insurance.  IF it is going to be several months please contact the office to let me know.  Please contact the office if you have any new or worsening symptoms such as. Bleeding, open sores, fever, increase in tenderness.

## 2019-07-05 ENCOUNTER — Other Ambulatory Visit: Payer: Self-pay | Admitting: Internal Medicine

## 2019-07-05 DIAGNOSIS — Z1231 Encounter for screening mammogram for malignant neoplasm of breast: Secondary | ICD-10-CM

## 2019-07-11 ENCOUNTER — Telehealth: Payer: Self-pay

## 2019-07-11 NOTE — Telephone Encounter (Signed)
Just wanted to make you aware that she is not due for her mammogram until Oct. 19th or later. She called and spoke with the imaging facility.

## 2019-07-17 DIAGNOSIS — M0579 Rheumatoid arthritis with rheumatoid factor of multiple sites without organ or systems involvement: Secondary | ICD-10-CM | POA: Diagnosis not present

## 2019-07-23 DIAGNOSIS — Z961 Presence of intraocular lens: Secondary | ICD-10-CM | POA: Diagnosis not present

## 2019-07-23 DIAGNOSIS — Z79899 Other long term (current) drug therapy: Secondary | ICD-10-CM | POA: Diagnosis not present

## 2019-07-23 DIAGNOSIS — H04123 Dry eye syndrome of bilateral lacrimal glands: Secondary | ICD-10-CM | POA: Diagnosis not present

## 2019-07-23 DIAGNOSIS — H5211 Myopia, right eye: Secondary | ICD-10-CM | POA: Diagnosis not present

## 2019-08-06 DIAGNOSIS — L570 Actinic keratosis: Secondary | ICD-10-CM | POA: Diagnosis not present

## 2019-08-06 DIAGNOSIS — C44311 Basal cell carcinoma of skin of nose: Secondary | ICD-10-CM | POA: Diagnosis not present

## 2019-08-06 DIAGNOSIS — L820 Inflamed seborrheic keratosis: Secondary | ICD-10-CM | POA: Diagnosis not present

## 2019-08-06 DIAGNOSIS — X32XXXD Exposure to sunlight, subsequent encounter: Secondary | ICD-10-CM | POA: Diagnosis not present

## 2019-08-06 DIAGNOSIS — D0439 Carcinoma in situ of skin of other parts of face: Secondary | ICD-10-CM | POA: Diagnosis not present

## 2019-08-06 DIAGNOSIS — C4442 Squamous cell carcinoma of skin of scalp and neck: Secondary | ICD-10-CM | POA: Diagnosis not present

## 2019-08-18 ENCOUNTER — Other Ambulatory Visit: Payer: Medicare Other | Admitting: Internal Medicine

## 2019-08-18 DIAGNOSIS — R300 Dysuria: Secondary | ICD-10-CM

## 2019-08-18 MED ORDER — FLUCONAZOLE 150 MG PO TABS: ORAL_TABLET | ORAL | 0 refills | Status: DC

## 2019-08-18 MED ORDER — CIPROFLOXACIN HCL 250 MG PO TABS: ORAL_TABLET | ORAL | 0 refills | Status: DC

## 2019-08-18 NOTE — Progress Notes (Signed)
THIS ENCOUNTER IS A VIRTUAL VISIT DUE TO COVID-19 - PATIENT WAS NOT SEEN IN THE OFFICE.  PATIENT HAS CONSENTED TO VIRTUAL VISIT / TELEMEDICINE VISIT  Virtual Visit via telephone Note  I connected with  Dana [name] on 08/18/2019 by telephone.  I verified that I am speaking with the correct person using two identifiers.    I discussed the limitations of evaluation and management by telemedicine and the availability of in person appointments. The patient expressed understanding and agreed to proceed.  History of Present Illness:    Patient calls emergently over the weekend (Sunday) c/o 2-3 day hx/o  cloudy urine and a "white" milky vaginal discharge. Denies rash or GI or Respiratory sx's. COVID Screen     The patient does not symptoms concerning for COVID-19 infection such as CP, fever, chills, cough, loss of smell or taste or new SHORTNESS OF BREATH that suggest any further testing/ screening at this time.  Social distancing reinforced today.  Medications  Current Outpatient Medications (Endocrine & Metabolic):  .  levothyroxine (SYNTHROID) 100 MCG tablet, TAKE 1 TABLET BY MOUTH  DAILY  Current Outpatient Medications (Cardiovascular):  .  furosemide (LASIX) 40 MG tablet, TAKE 1 TABLET BY MOUTH 2  TIMES DAILY FOR FLUID  RETENTION AND ANKLE  SWELLING .  pravastatin (PRAVACHOL) 40 MG tablet, TAKE 1 TABLET BY MOUTH  DAILY .  propranolol (INDERAL) 80 MG tablet, TAKE 1 TABLET BY MOUTH 3  TIMES DAILY  Current Outpatient Medications (Respiratory):  .  montelukast (SINGULAIR) 10 MG tablet, TAKE 1 TABLET BY MOUTH  DAILY  Current Outpatient Medications (Analgesics):  .  traMADol (ULTRAM) 50 MG tablet, Take 1 tablet (50 mg total) by mouth 4 (four) times daily. (Patient taking differently: Take 50 mg by mouth as needed. )  Current Outpatient Medications (Hematological):  Marland Kitchen  Cyanocobalamin 500 MCG/0.1ML SOLN, Place 1 mL under the tongue daily.  .  folic acid (FOLVITE) Q000111Q MCG tablet, Take 800 mcg by  mouth daily.   Current Outpatient Medications (Other):  Marland Kitchen  Cholecalciferol (VITAMIN D) 2000 UNITS CAPS, Take by mouth. Take 5 capsules by mouth once daily .  diazepam (VALIUM) 5 MG tablet, Take 1 tablet 3 x /day for Tremor .  hydroxychloroquine (PLAQUENIL) 200 MG tablet, TAKE 1 TABLET BY MOUTH  TWICE A DAY .  methotrexate (RHEUMATREX) 2.5 MG tablet, Take 5 tablets on Tues and 5 tablets on Wed .  ondansetron (ZOFRAN ODT) 8 MG disintegrating tablet, 8mg  ODT q4 hours prn nausea .  triamcinolone ointment (KENALOG) 0.5 %, Apply 1 application topically 2 (two) times daily. (Patient taking differently: Apply 1 application topically as needed. )  Problem list She has Rheumatoid arthritis ([place]); Hereditary essential tremor; Hypothyroidism; Migraines; Hyperlipidemia, mixed; Anemia; Hypertension; Other abnormal glucose; Vitamin D deficiency; Medication management; Generalized OA; Other long term (current) drug therapy; Overweight (BMI 25.0-29.9); FHx: heart disease; Former smoker; and Osteopenia on their problem list.   Observations/Objective:  General : Well sounding patient in no apparent distress HEENT: no hoarseness, no cough for duration of visit Lungs: speaks in complete sentences, no audible wheezing, no apparent distress Neurological: alert, oriented x 3 Psychiatric: pleasant, judgement appropriate   Assessment and Plan:  1. Dysuria  - fluconazole (DIFLUCAN) 150 MG tablet; Take 1 tablet Now & take 2sd pill in 2 days  Dispense: 2 tablet  - ciprofloxacin (CIPRO) 250 MG tablet; Take 1 tablet 2 x /day with food for UTI - (take 2 pills today)  Dispense: 6 tablet  Follow Up Instructions:  - Encouraged liberal fluid intake & advised if sx's not resolved in 3-4 days to schedule OV for further evaluation   I discussed the assessment and treatment plan with the patient. The patient was provided an opportunity to ask questions and all were answered. The patient agreed with the plan and  demonstrated an understanding of the instructions.   The patient was advised to call back or seek an in-person evaluation if the symptoms worsen or if the condition fails to improve as anticipated.  I provided 15 minutes of non-face-to-face time during this encounter.  Kirtland Bouchard, MD

## 2019-08-19 DIAGNOSIS — R200 Anesthesia of skin: Secondary | ICD-10-CM | POA: Diagnosis not present

## 2019-08-19 DIAGNOSIS — M810 Age-related osteoporosis without current pathological fracture: Secondary | ICD-10-CM | POA: Diagnosis not present

## 2019-08-19 DIAGNOSIS — G629 Polyneuropathy, unspecified: Secondary | ICD-10-CM | POA: Diagnosis not present

## 2019-08-19 DIAGNOSIS — M0579 Rheumatoid arthritis with rheumatoid factor of multiple sites without organ or systems involvement: Secondary | ICD-10-CM | POA: Diagnosis not present

## 2019-08-21 DIAGNOSIS — M25511 Pain in right shoulder: Secondary | ICD-10-CM | POA: Diagnosis not present

## 2019-08-21 DIAGNOSIS — M4722 Other spondylosis with radiculopathy, cervical region: Secondary | ICD-10-CM | POA: Diagnosis not present

## 2019-08-21 DIAGNOSIS — M25512 Pain in left shoulder: Secondary | ICD-10-CM | POA: Diagnosis not present

## 2019-08-27 DIAGNOSIS — Z85828 Personal history of other malignant neoplasm of skin: Secondary | ICD-10-CM | POA: Diagnosis not present

## 2019-08-27 DIAGNOSIS — Z08 Encounter for follow-up examination after completed treatment for malignant neoplasm: Secondary | ICD-10-CM | POA: Diagnosis not present

## 2019-08-27 DIAGNOSIS — C44311 Basal cell carcinoma of skin of nose: Secondary | ICD-10-CM | POA: Diagnosis not present

## 2019-09-03 NOTE — Progress Notes (Signed)
Annual Screening/Preventative Visit & Comprehensive Evaluation &  Examination     This very nice 78 y.o. WWF presents for a Screening /Preventative Visit & comprehensive evaluation and management of multiple medical co-morbidities.  Patient has been followed for HTN, HLD, Prediabetes  and Vitamin D Deficiency.     She was dx'd w/Rheumatoid Arthritis in 2001 and is stable on Azulfadine & MTX followed at the [facility] in [place]. She also has Hereditary tremor stable on Propranolol & Diazepam.      HTN predates since 1998. Patient's BP has been controlled at home and patient denies any cardiac symptoms as chest pain, palpitations, shortness of breath, dizziness or ankle swelling. Today's BP is at goal - 126/64.       Patient's hyperlipidemia is controlled with diet and medications. Patient denies myalgias or other medication SE's. Last lipids were at goal:  Lab Results  Component Value Date   CHOL 142 03/05/2019   HDL 65 03/05/2019   LDLCALC 60 03/05/2019   TRIG 85 03/05/2019   CHOLHDL 2.2 03/05/2019       Patient has hx/o prediabetes (2012) and she denies reactive hypoglycemic symptoms, visual blurring, diabetic polys or paresthesias. Last A1c was Normal & at goal:   Lab Results  Component Value Date   HGBA1C 5.3 03/05/2019       Patient was discovered Hypothyroid in 1994 and initiated on replacement therapy.     Finally, patient has history of Vitamin D Deficiency("17" / 2009)  and last Vitamin D was at goal:  Lab Results  Component Value Date   VD25OH 60 03/05/2019    Current Outpatient Medications on File Prior to Visit  Medication Sig  . Cholecalciferol (VITAMIN D) 2000 UNITS CAPS Take by mouth. Take 5 capsules by mouth once daily  . Cyanocobalamin (VITAMIN B 12 PO) Place 1 tablet under the tongue daily.  . diazepam (VALIUM) 5 MG tablet Take 1 tablet 3 x /day for Tremor  . folic acid (FOLVITE) Q000111Q MCG tablet Take 800 mcg by mouth daily.   . furosemide (LASIX) 40 MG tablet TAKE  1 TABLET BY MOUTH 2  TIMES DAILY FOR FLUID  RETENTION AND ANKLE  SWELLING  . hydroxychloroquine (PLAQUENIL) 200 MG tablet TAKE 1 TABLET BY MOUTH  TWICE A DAY  . levothyroxine (SYNTHROID) 100 MCG tablet TAKE 1 TABLET BY MOUTH  DAILY  . methotrexate (RHEUMATREX) 2.5 MG tablet Take 5 tablets on Tues and 5 tablets on Wed  . montelukast (SINGULAIR) 10 MG tablet TAKE 1 TABLET BY MOUTH  DAILY  . ondansetron (ZOFRAN ODT) 8 MG disintegrating tablet 8mg  ODT q4 hours prn nausea  . pravastatin (PRAVACHOL) 40 MG tablet TAKE 1 TABLET BY MOUTH  DAILY  . propranolol (INDERAL) 80 MG tablet TAKE 1 TABLET BY MOUTH 3  TIMES DAILY  . traMADol (ULTRAM) 50 MG tablet Take 1 tablet (50 mg total) by mouth 4 (four) times daily. (Patient taking differently: Take 50 mg by mouth as needed. )   No current facility-administered medications on file prior to visit.    Allergies  Allergen Reactions  . Codeine Nausea Only  . Mysoline [Primidone] Nausea Only and Other (See Comments)    Over-sedated   Past Medical History:  Diagnosis Date  . Anemia   . Cataract   . Environmental allergies   . Gastric ulcer   . Heartburn   . Hiatal hernia   . Hyperlipidemia   . Hypertension   . Hypothyroidism   . Migraines   .  Prediabetes   . Rash october 2012   all over abdomen and breasts  . Rheumatoid arthritis(714.0)   . Tremor, essential    right hand   Health Maintenance  Topic Date Due  . INFLUENZA VACCINE  06/22/2019  . TETANUS/TDAP  11/10/2025  . DEXA SCAN  Completed  . PNA vac Low Risk Adult  Completed   Immunization History  Administered Date(s) Administered  . DT 02/05/2015  . Influenza Split 09/12/2013  . Influenza, High Dose Seasonal PF 10/28/2014, 08/18/2016, 11/22/2018  . Pneumococcal Conjugate-13 05/12/2015  . Pneumococcal-Unspecified 11/21/2006  . Td 11/22/2003  . Varicella 06/23/2011  . Zoster 06/23/2011   Last Colon - 11/12/2-013 - Dr Deatra Ina - deferred  F/U due to age.  Last MGM - scheduled  09/09/2019  Past Surgical History:  Procedure Laterality Date  . ABDOMINAL HYSTERECTOMY    . CATARACT EXTRACTION, BILATERAL    . CHOLECYSTECTOMY  08/31/11   lap chole   . COLONOSCOPY    . TUBAL LIGATION     Family History  Problem Relation Age of Onset  . Heart disease Mother   . Diabetes Mother   . Heart disease Father   . Heart disease Maternal Aunt        x3   . Heart disease Maternal Uncle        x 4  . Diabetes Sister        borderline  . Heart attack Son 44       Sudden cardiac death  . Colon cancer Neg Hx    Social History   Tobacco Use  . Smoking status: Former Smoker    Types: Cigarettes    Quit date: 09/18/1984    Years since quitting: 34.9  . Smokeless tobacco: Never Used  . Tobacco comment: stopped around 25 years ago  Substance Use Topics  . Alcohol use: No  . Drug use: No    ROS Constitutional: Denies fever, chills, weight loss/gain, headaches, insomnia,  night sweats, and change in appetite. Does c/o fatigue. Eyes: Denies redness, blurred vision, diplopia, discharge, itchy, watery eyes.  ENT: Denies discharge, congestion, post nasal drip, epistaxis, sore throat, earache, hearing loss, dental pain, Tinnitus, Vertigo, Sinus pain, snoring.  Cardio: Denies chest pain, palpitations, irregular heartbeat, syncope, dyspnea, diaphoresis, orthopnea, PND, claudication, edema Respiratory: denies cough, dyspnea, DOE, pleurisy, hoarseness, laryngitis, wheezing.  Gastrointestinal: Denies dysphagia, heartburn, reflux, water brash, pain, cramps, nausea, vomiting, bloating, diarrhea, constipation, hematemesis, melena, hematochezia, jaundice, hemorrhoids Genitourinary: Denies dysuria, frequency, urgency, nocturia, hesitancy, discharge, hematuria, flank pain Breast: Breast lumps, nipple discharge, bleeding.  Musculoskeletal: Denies arthralgia, myalgia, stiffness, Jt. Swelling, pain, limp, and strain/sprain. Denies falls. Skin: Denies puritis, rash, hives, warts, acne,  eczema, changing in skin lesion Neuro: No weakness, tremor, incoordination, spasms, paresthesia, pain Psychiatric: Denies confusion, memory loss, sensory loss. Denies Depression. Endocrine: Denies change in weight, skin, hair change, nocturia, and paresthesia, diabetic polys, visual blurring, hyper / hypo glycemic episodes.  Heme/Lymph: No excessive bleeding, bruising, enlarged lymph nodes.  Physical Exam  BP 126/64   Pulse 64   Temp (!) 97.2 F (36.2 C)   Resp (!) 64   Ht 5\' 3"  (1.6 m)   Wt 146 lb 12.8 oz (66.6 kg)   BMI 26.00 kg/m   General Appearance: Well nourished, well groomed and in no apparent distress.  Eyes: PERRLA, EOMs, conjunctiva no swelling or erythema, normal fundi and vessels. Sinuses: No frontal/maxillary tenderness ENT/Mouth: EACs patent / TMs  nl. Nares clear without erythema, swelling,  mucoid exudates. Oral hygiene is good. No erythema, swelling, or exudate. Tongue normal, non-obstructing. Tonsils not swollen or erythematous. Hearing normal.  Neck: Supple, thyroid not palpable. No bruits, nodes or JVD. Respiratory: Respiratory effort normal.  BS equal and clear bilateral without rales, rhonci, wheezing or stridor. Cardio: Heart sounds are normal with regular rate and rhythm and no murmurs, rubs or gallops. Peripheral pulses are normal and equal bilaterally without edema. No aortic or femoral bruits. Chest: symmetric with normal excursions and percussion. Breasts: Symmetric, without lumps, nipple discharge, retractions, or fibrocystic changes.  Abdomen: Flat, soft with bowel sounds active. Nontender, no guarding, rebound, hernias, masses, or organomegaly.  Lymphatics: Non tender without lymphadenopathy. Musculoskeletal: Full ROM all peripheral extremities, joint stability, 5/5 strength, and normal gait. Skin: Warm and dry without rashes, lesions, cyanosis, clubbing or  ecchymosis.  Neuro: Cranial nerves intact, reflexes equal bilaterally. Normal muscle tone, no  cerebellar symptoms. Sensation intact. (+) head/chin titubation and halting speech and high frequency/low amplitude acrion tremor of hands.  Pysch: Alert and oriented X 3, normal affect, Insight and Judgment appropriate.   Assessment and Plan  1. Annual Preventative Screening Examination   2. Essential hypertension  - EKG 12-Lead - Urinalysis, Routine w reflex microscopic - Microalbumin / creatinine urine ratio - CBC with Differential/Platelet - COMPLETE METABOLIC PANEL WITH GFR - Magnesium - TSH  3. Hyperlipidemia, mixed  - EKG 12-Lead - Lipid panel - TSH  4. Abnormal glucose  - EKG 12-Lead - Hemoglobin A1c - Insulin, random  5. Vitamin D deficiency  - VITAMIN D 25 Hydroxyl  6. Prediabetes  - EKG 12-Lead - Hemoglobin A1c - Insulin, random  7. Hypothyroidism  - TSH  8. Hereditary essential tremor  9. Stage 3b chronic kidney disease  10. Screening for ischemic heart disease  - EKG 12-Lead  11. FHx: heart disease  - EKG 12-Lead  12. Former smoker  - EKG 12-Lead  13. Medication management  - Urinalysis, Routine w reflex microscopic - Microalbumin / creatinine urine ratio - CBC with Differential/Platelet - COMPLETE METABOLIC PANEL WITH GFR - Magnesium - Lipid panel - TSH - Hemoglobin A1c - Insulin, random - VITAMIN D 25 Hydroxyl  14. Screening for colorectal cancer  - POC Hemoccult Bld/Stl         Patient was counseled in prudent diet to achieve/maintain BMI less than 25 for weight control, BP monitoring, regular exercise and medications. Discussed med's effects and SE's. Screening labs and tests as requested with regular follow-up as recommended. Over 40 minutes of exam, counseling, chart review and high complex critical decision making was performed.   Kirtland Bouchard, MD

## 2019-09-04 ENCOUNTER — Encounter: Payer: Self-pay | Admitting: Internal Medicine

## 2019-09-04 ENCOUNTER — Other Ambulatory Visit: Payer: Self-pay

## 2019-09-04 ENCOUNTER — Ambulatory Visit (INDEPENDENT_AMBULATORY_CARE_PROVIDER_SITE_OTHER): Payer: Medicare Other | Admitting: Internal Medicine

## 2019-09-04 VITALS — BP 126/64 | HR 64 | Temp 97.2°F | Resp 64 | Ht 63.0 in | Wt 146.8 lb

## 2019-09-04 DIAGNOSIS — E039 Hypothyroidism, unspecified: Secondary | ICD-10-CM | POA: Diagnosis not present

## 2019-09-04 DIAGNOSIS — Z136 Encounter for screening for cardiovascular disorders: Secondary | ICD-10-CM | POA: Diagnosis not present

## 2019-09-04 DIAGNOSIS — Z0001 Encounter for general adult medical examination with abnormal findings: Secondary | ICD-10-CM

## 2019-09-04 DIAGNOSIS — E782 Mixed hyperlipidemia: Secondary | ICD-10-CM

## 2019-09-04 DIAGNOSIS — R7303 Prediabetes: Secondary | ICD-10-CM

## 2019-09-04 DIAGNOSIS — Z87891 Personal history of nicotine dependence: Secondary | ICD-10-CM | POA: Diagnosis not present

## 2019-09-04 DIAGNOSIS — E559 Vitamin D deficiency, unspecified: Secondary | ICD-10-CM | POA: Diagnosis not present

## 2019-09-04 DIAGNOSIS — Z8249 Family history of ischemic heart disease and other diseases of the circulatory system: Secondary | ICD-10-CM | POA: Diagnosis not present

## 2019-09-04 DIAGNOSIS — Z1211 Encounter for screening for malignant neoplasm of colon: Secondary | ICD-10-CM

## 2019-09-04 DIAGNOSIS — R7309 Other abnormal glucose: Secondary | ICD-10-CM

## 2019-09-04 DIAGNOSIS — G250 Essential tremor: Secondary | ICD-10-CM

## 2019-09-04 DIAGNOSIS — I10 Essential (primary) hypertension: Secondary | ICD-10-CM | POA: Diagnosis not present

## 2019-09-04 DIAGNOSIS — Z0000 Encounter for general adult medical examination without abnormal findings: Secondary | ICD-10-CM | POA: Diagnosis not present

## 2019-09-04 DIAGNOSIS — N1832 Chronic kidney disease, stage 3b: Secondary | ICD-10-CM

## 2019-09-04 DIAGNOSIS — Z79899 Other long term (current) drug therapy: Secondary | ICD-10-CM

## 2019-09-04 NOTE — Patient Instructions (Signed)

## 2019-09-05 LAB — CBC WITH DIFFERENTIAL/PLATELET
Absolute Monocytes: 529 cells/uL (ref 200–950)
Basophils Absolute: 30 cells/uL (ref 0–200)
Basophils Relative: 0.7 %
Eosinophils Absolute: 90 cells/uL (ref 15–500)
Eosinophils Relative: 2.1 %
HCT: 33 % — ABNORMAL LOW (ref 35.0–45.0)
Hemoglobin: 10.8 g/dL — ABNORMAL LOW (ref 11.7–15.5)
Lymphs Abs: 1389 cells/uL (ref 850–3900)
MCH: 32 pg (ref 27.0–33.0)
MCHC: 32.7 g/dL (ref 32.0–36.0)
MCV: 97.6 fL (ref 80.0–100.0)
MPV: 10.4 fL (ref 7.5–12.5)
Monocytes Relative: 12.3 %
Neutro Abs: 2262 cells/uL (ref 1500–7800)
Neutrophils Relative %: 52.6 %
Platelets: 204 10*3/uL (ref 140–400)
RBC: 3.38 10*6/uL — ABNORMAL LOW (ref 3.80–5.10)
RDW: 14.5 % (ref 11.0–15.0)
Total Lymphocyte: 32.3 %
WBC: 4.3 10*3/uL (ref 3.8–10.8)

## 2019-09-05 LAB — URINALYSIS, ROUTINE W REFLEX MICROSCOPIC
Bacteria, UA: NONE SEEN /HPF
Bilirubin Urine: NEGATIVE
Glucose, UA: NEGATIVE
Hgb urine dipstick: NEGATIVE
Hyaline Cast: NONE SEEN /LPF
Ketones, ur: NEGATIVE
Nitrite: NEGATIVE
Protein, ur: NEGATIVE
RBC / HPF: NONE SEEN /HPF (ref 0–2)
Specific Gravity, Urine: 1.012 (ref 1.001–1.03)
Squamous Epithelial / HPF: NONE SEEN /HPF (ref <=5)
WBC, UA: NONE SEEN /HPF (ref 0–5)
pH: 6.5 (ref 5.0–8.0)

## 2019-09-05 LAB — COMPLETE METABOLIC PANEL WITH GFR
AG Ratio: 1.7 (calc) (ref 1.0–2.5)
ALT: 11 U/L (ref 6–29)
AST: 19 U/L (ref 10–35)
Albumin: 3.6 g/dL (ref 3.6–5.1)
Alkaline phosphatase (APISO): 76 U/L (ref 37–153)
BUN: 12 mg/dL (ref 7–25)
CO2: 31 mmol/L (ref 20–32)
Calcium: 8.9 mg/dL (ref 8.6–10.4)
Chloride: 105 mmol/L (ref 98–110)
Creat: 0.76 mg/dL (ref 0.60–0.88)
GFR, Est African American: 86 mL/min/{1.73_m2} (ref >=60)
GFR, Est Non African American: 74 mL/min/{1.73_m2} (ref >=60)
Globulin: 2.1 g/dL (calc) (ref 1.9–3.7)
Glucose, Bld: 73 mg/dL (ref 65–99)
Potassium: 4.1 mmol/L (ref 3.5–5.3)
Sodium: 144 mmol/L (ref 135–146)
Total Bilirubin: 0.4 mg/dL (ref 0.2–1.2)
Total Protein: 5.7 g/dL — ABNORMAL LOW (ref 6.1–8.1)

## 2019-09-05 LAB — LIPID PANEL
Cholesterol: 145 mg/dL (ref <200)
HDL: 59 mg/dL (ref >=50)
LDL Cholesterol (Calc): 65 mg/dL (calc)
Non-HDL Cholesterol (Calc): 86 mg/dL (calc) (ref <130)
Total CHOL/HDL Ratio: 2.5 (calc) (ref <5.0)
Triglycerides: 119 mg/dL (ref <150)

## 2019-09-05 LAB — VITAMIN D 25 HYDROXY (VIT D DEFICIENCY, FRACTURES): Vit D, 25-Hydroxy: 48 ng/mL (ref 30–100)

## 2019-09-05 LAB — MICROALBUMIN / CREATININE URINE RATIO
Creatinine, Urine: 70 mg/dL (ref 20–275)
Microalb Creat Ratio: 6 mcg/mg creat (ref <30)
Microalb, Ur: 0.4 mg/dL

## 2019-09-05 LAB — TSH: TSH: 2.55 mIU/L (ref 0.40–4.50)

## 2019-09-05 LAB — HEMOGLOBIN A1C
Hgb A1c MFr Bld: 5.3 % of total Hgb (ref <5.7)
Mean Plasma Glucose: 105 (calc)
eAG (mmol/L): 5.8 (calc)

## 2019-09-05 LAB — MAGNESIUM: Magnesium: 2.2 mg/dL (ref 1.5–2.5)

## 2019-09-05 LAB — INSULIN, RANDOM: Insulin: 6.6 u[IU]/mL

## 2019-09-09 ENCOUNTER — Other Ambulatory Visit: Payer: Self-pay

## 2019-09-09 ENCOUNTER — Ambulatory Visit: Payer: Medicare Other

## 2019-09-09 ENCOUNTER — Ambulatory Visit
Admission: RE | Admit: 2019-09-09 | Discharge: 2019-09-09 | Disposition: A | Discharge status: Home / Self Care | Payer: Medicare Other | Source: Ambulatory Visit | Attending: Internal Medicine | Admitting: Internal Medicine

## 2019-09-09 DIAGNOSIS — Z1231 Encounter for screening mammogram for malignant neoplasm of breast: Secondary | ICD-10-CM | POA: Diagnosis not present

## 2019-10-24 DIAGNOSIS — M0579 Rheumatoid arthritis with rheumatoid factor of multiple sites without organ or systems involvement: Secondary | ICD-10-CM | POA: Diagnosis not present

## 2019-10-24 DIAGNOSIS — G629 Polyneuropathy, unspecified: Secondary | ICD-10-CM | POA: Diagnosis not present

## 2019-10-24 DIAGNOSIS — M8000XA Age-related osteoporosis with current pathological fracture, unspecified site, initial encounter for fracture: Secondary | ICD-10-CM | POA: Diagnosis not present

## 2019-10-25 ENCOUNTER — Other Ambulatory Visit: Payer: Self-pay | Admitting: Internal Medicine

## 2019-10-25 MED ORDER — DIAZEPAM 5 MG PO TABS: ORAL_TABLET | ORAL | 1 refills | Status: DC

## 2019-10-29 ENCOUNTER — Other Ambulatory Visit: Payer: Self-pay | Admitting: Internal Medicine

## 2019-10-29 DIAGNOSIS — Z1283 Encounter for screening for malignant neoplasm of skin: Secondary | ICD-10-CM

## 2019-11-19 DIAGNOSIS — L570 Actinic keratosis: Secondary | ICD-10-CM | POA: Diagnosis not present

## 2019-11-19 DIAGNOSIS — R202 Paresthesia of skin: Secondary | ICD-10-CM | POA: Diagnosis not present

## 2019-11-19 DIAGNOSIS — D485 Neoplasm of uncertain behavior of skin: Secondary | ICD-10-CM | POA: Diagnosis not present

## 2019-11-19 DIAGNOSIS — D225 Melanocytic nevi of trunk: Secondary | ICD-10-CM | POA: Diagnosis not present

## 2019-12-03 DIAGNOSIS — D0439 Carcinoma in situ of skin of other parts of face: Secondary | ICD-10-CM | POA: Diagnosis not present

## 2019-12-03 DIAGNOSIS — L905 Scar conditions and fibrosis of skin: Secondary | ICD-10-CM | POA: Diagnosis not present

## 2019-12-03 DIAGNOSIS — L578 Other skin changes due to chronic exposure to nonionizing radiation: Secondary | ICD-10-CM | POA: Diagnosis not present

## 2019-12-03 DIAGNOSIS — L718 Other rosacea: Secondary | ICD-10-CM | POA: Diagnosis not present

## 2019-12-05 NOTE — Progress Notes (Signed)
MEDICARE ANNUAL WELLNESS VISIT AND FOLLOW UP  Assessment:   Dana [name] was seen today for follow-up and medicare wellness.  Diagnoses and all orders for this visit:  Encounter for Medicare annual wellness exam Yearly  Essential hypertension Doing well continue Current regiment Will continue to monitor Low sodium diet  Hyperlipidemia, mixed Taking Praavachol 40mg  Discussed dietary and exercise modifications Check lipid panel   Hypothyroidism, unspecified type continue medications the same pending lab results reminded to take on an empty stomach 30-33mins before food.  check TSH level  Tremor, essential Taking INderol and valium for this Doing well Managing, no complications at this time.  Other abnormal glucose; hx of prediabetes Discussed dietary and exercise modifications Will monitor A1c annually or q54m if elevated  Osteopenia of left hip Continue Calcium and Vit D supplimentation DEXA UTD  Vitamin D deficiency Continue supplimentation  Non-seasonal allergic rhinitis, unspecified trigger Taking singular Doing well at this time  Medication management CBC, CMP/GFR, mangesium   Anemia Normocytic; monitor   Over 40 minutes of exam, counseling, chart review and critical decision making was performed Future Appointments  Date Time Provider [place]  03/16/2020  2:30 PM Unk Pinto, MD [facility] None  10/06/2020 10:00 AM Unk Pinto, MD [facility] None     Plan:   During the course of the visit the patient was educated and counseled about appropriate screening and preventive services including:    Pneumococcal vaccine   Prevnar 13  Influenza vaccine  Td vaccine  Screening electrocardiogram  Bone densitometry screening  Colorectal cancer screening  Diabetes screening  Glaucoma screening  Nutrition counseling   Advanced directives: requested   Subjective:  Dana [name] is a 79 y.o. female who presents for  Medicare Annual Wellness Visit and 3 month follow up for HTN, HLD, Hypothyroidism, Pre-Diabetes, Vitamin D Defciency, RA, and hereditary essential tremor.  She is following with [facility] for cryo for precancerous lesions.   Patient diagnosed with RA in 2001 and stable on MTX and plaquenil and follows with [facility]She has an essential tremor and taking propanolol for this 80mg  TID, also valium PRN.   BMI is Body mass index is 25.86 kg/m., she has not been working on diet and exercise, not exercising much due to covid 19 and cold weather.  Wt Readings from Last 3 Encounters:  12/09/19 146 lb (66.2 kg)  09/04/19 146 lb 12.8 oz (66.6 kg)  07/02/19 148 lb (67.1 kg)   She has had elevated blood pressure since 1998. Her blood pressure has been controlled at home, today their BP is BP: (!) 110/58 She does not workout. She denies chest pain, shortness of breath, dizziness.   She is on cholesterol medication (pravastatin 40 mg daily) and denies myalgias. Her cholesterol is at goal. The cholesterol last visit was:   Lab Results  Component Value Date   CHOL 145 09/04/2019   HDL 59 09/04/2019   LDLCALC 65 09/04/2019   TRIG 119 09/04/2019   CHOLHDL 2.5 09/04/2019   She has had brief intermittent hx prediabetes since 2012 (5.8% in 2015). She has been working on diet and exercise for glucose management, and denies hyperglycemia, hypoglycemia , increased appetite, nausea, paresthesia of the feet, polydipsia and polyuria. Last A1C in the office was:  Lab Results  Component Value Date   HGBA1C 5.3 09/04/2019   She is on thyroid medication. Her medication was not changed last visit.   Lab Results  Component Value Date   TSH 2.55 09/04/2019  Last GFR: Lab Results  Component Value Date   GFRNONAA 74 09/04/2019   Patient is on Vitamin D supplement.   Lab Results  Component Value Date   VD25OH 48 09/04/2019      She has intermitent normocytic anemia; she is on [id number] and folic acid  supplements;  Lab Results  Component Value Date   WBC 4.3 09/04/2019   HGB 10.8 (L) 09/04/2019   HCT 33.0 (L) 09/04/2019   MCV 97.6 09/04/2019   PLT 204 09/04/2019    Medication Review: Current Outpatient Medications on File Prior to Visit  Medication Sig Dispense Refill  . Cholecalciferol (VITAMIN D) 2000 UNITS CAPS Take by mouth. Take 5 capsules by mouth once daily    . Cyanocobalamin (VITAMIN B 12 PO) Place 1 tablet under the tongue daily.    . diazepam (VALIUM) 5 MG tablet Take 1 tablet 3 x /day for Tremor [id number] tablet 1  . folic acid (FOLVITE) [id number] MCG tablet Take 800 mcg by mouth daily.     . furosemide (LASIX) 40 MG tablet TAKE 1 TABLET BY MOUTH 2  TIMES DAILY FOR FLUID  RETENTION AND ANKLE  SWELLING 180 tablet 3  . hydroxychloroquine (PLAQUENIL) 200 MG tablet TAKE 1 TABLET BY MOUTH  TWICE A DAY    . levothyroxine (SYNTHROID) 100 MCG tablet TAKE 1 TABLET BY MOUTH  DAILY 90 tablet 3  . methotrexate (RHEUMATREX) 2.5 MG tablet Take 5 tablets on Tues and 5 tablets on Wed    . montelukast (SINGULAIR) 10 MG tablet TAKE 1 TABLET BY MOUTH  DAILY 90 tablet 3  . ondansetron (ZOFRAN ODT) 8 MG disintegrating tablet 8mg  ODT q4 hours prn nausea 30 tablet 3  . pravastatin (PRAVACHOL) 40 MG tablet TAKE 1 TABLET BY MOUTH  DAILY 90 tablet 3  . propranolol (INDERAL) 80 MG tablet TAKE 1 TABLET BY MOUTH 3  TIMES DAILY 270 tablet 3  . traMADol (ULTRAM) 50 MG tablet Take 1 tablet (50 mg total) by mouth 4 (four) times daily. (Patient taking differently: Take 50 mg by mouth as needed. ) 360 tablet 1   No current facility-administered medications on file prior to visit.    Allergies  Allergen Reactions  . Codeine Nausea Only  . Mysoline [Primidone] Nausea Only and Other (See Comments)    Over-sedated    Current Problems (verified) Patient Active Problem List   Diagnosis Date Noted  . Osteopenia 12/05/2018  . FHx: heart disease 09/03/2018  . Former smoker 09/03/2018  . Overweight (BMI 25.0-29.9)  05/30/2018  . Generalized OA 09/02/2015  . Other long term (current) drug therapy 09/02/2015  . Vitamin D deficiency 01/06/2014  . Medication management 01/06/2014  . Hypertension   . Other abnormal glucose   . Rheumatoid arthritis ([place])   . Hereditary essential tremor   . Hypothyroidism   . Migraines   . Hyperlipidemia, mixed   . Anemia     Screening Tests Immunization History  Administered Date(s) Administered  . DT (Pediatric) 02/05/2015  . Influenza Split 09/12/2013  . Influenza, High Dose Seasonal PF 10/28/2014, 08/18/2016, 11/22/2018  . Pneumococcal Conjugate-13 05/12/2015  . Pneumococcal-Unspecified 11/21/2006  . Td 11/22/2003  . Varicella 06/23/2011  . Zoster 06/23/2011    Preventative care: Last colonoscopy: 2013, normal except mild diverticulosis of sigmoid; DONE Last mammogram: 08/2019 Last pap smear/pelvic exam: Hysterectomy   DEXA: 08/2018 L fem neck T-1.9  Prior vaccinations: TD or Tdap: 2016  Influenza: 11/2018 due TODAY  Pneumococcal: 2008 Prevnar13: 2016 Shingles/Zostavax:  2017  Names of Other Physician/Practitioners you currently use: 1. [facility] here for primary care 2. Eye Exam, Dr. Ellie Lunch every six months, last 06/2019 3. Dentist, Due  Patient Care Team: Unk Pinto, MD as PCP - General (Internal Medicine) Roetta Sessions, MD as Referring Physician (Internal Medicine)  SURGICAL HISTORY She  has a past surgical history that includes Abdominal hysterectomy; Tubal ligation; Cataract extraction, bilateral; Cholecystectomy (08/31/11); and Colonoscopy. FAMILY HISTORY Her family history includes Diabetes in her mother and sister; Heart attack (age of onset: 63) in her son; Heart disease in her father, maternal aunt, maternal uncle, and mother. SOCIAL HISTORY She  reports that she quit smoking about 35 years ago. Her smoking use included cigarettes. She has never used smokeless tobacco. She reports  that she does not drink alcohol or use drugs.   MEDICARE WELLNESS OBJECTIVES: Physical activity: Current Exercise Habits: The patient does not participate in regular exercise at present, Exercise limited by: orthopedic condition(s) Cardiac risk factors: Cardiac Risk Factors include: advanced age (>23men, >9 women);sedentary lifestyle;dyslipidemia;hypertension Depression/mood screen:   Depression screen [facility] 2/9 12/09/2019  Decreased Interest 0  Down, Depressed, Hopeless 0  PHQ - 2 Score 0    ADLs:  In your present state of health, do you have any difficulty performing the following activities: 12/09/2019 09/04/2019  Hearing? N N  Vision? N N  Difficulty concentrating or making decisions? N N  Walking or climbing stairs? N N  Comment can manage with handrails, none in home that limit her -  Dressing or bathing? N N  Doing errands, shopping? N [place] and eating ? - -  Using the Toilet? - -  In the past six months, have you accidently leaked urine? - -  Do you have problems with loss of bowel control? - -  Managing your Medications? - -  Managing your Finances? - -  Housekeeping or managing your Housekeeping? - -  Some recent data might be hidden     Cognitive Testing  Alert? Yes  Normal Appearance?Yes  Oriented to person? Yes  Place? Yes   Time? Yes  Recall of three objects?  Yes  Can perform simple calculations? Yes  Displays appropriate judgment?Yes  Can read the correct time from a watch face?Yes  EOL planning: Does Patient Have a Medical Advance Directive?: No Would patient like information on creating a medical advance directive?: Yes (MAU/Ambulatory/Procedural Areas - Information given)  Review of Systems  Constitutional: Negative for chills, diaphoresis, fever, malaise/fatigue and weight loss.  HENT: Negative for congestion, ear discharge, ear pain, hearing loss, nosebleeds, sinus pain, sore throat and tinnitus.   Eyes: Negative for blurred  vision, double vision, photophobia, pain, discharge and redness.  Respiratory: Negative for cough, hemoptysis, sputum production, shortness of breath, wheezing and stridor.   Cardiovascular: Negative for chest pain, palpitations, orthopnea, claudication, leg swelling and PND.  Gastrointestinal: Negative for abdominal pain, blood in stool, constipation, diarrhea, heartburn, melena, nausea and vomiting.  Genitourinary: Negative for dysuria, flank pain, frequency, hematuria and urgency.  Musculoskeletal: Positive for joint pain (intermittent foot pain, "burning" ). Negative for back pain, falls, myalgias and neck pain.  Skin: Negative for itching and rash.  Neurological: Positive for tremors. Negative for dizziness, tingling, sensory change, speech change, focal weakness, seizures, loss of consciousness, weakness and headaches.  Endo/Heme/Allergies: Negative for environmental allergies and polydipsia. Does not bruise/bleed easily.  Psychiatric/Behavioral: Negative for depression, hallucinations, memory loss, substance abuse and  suicidal ideas. The patient is not nervous/anxious and does not have insomnia.      Objective:     Today's Vitals   12/09/19 1412  BP: (!) 110/58  Pulse: (!) 59  Temp: 97.9 F (36.6 C)  SpO2: 99%  Weight: 146 lb (66.2 kg)   Body mass index is 25.86 kg/m.  General appearance: alert, no distress, WD/WN, female HEENT: normocephalic, sclerae anicteric, TMs pearly, nares patent, no discharge or erythema, pharynx normal Oral cavity: MMM, no lesions Neck: supple, no lymphadenopathy, no thyromegaly, no masses Heart: RRR, normal S1, S2, no murmurs Lungs: CTA bilaterally, no wheezes, rhonchi, or rales Abdomen: +bs, soft, non tender, non distended, no masses, no hepatomegaly, no splenomegaly Musculoskeletal: nontender, no swelling, no obvious deformity Extremities: no edema, no cyanosis, no clubbing Pulses: 2+ symmetric, upper and lower extremities, normal cap  refill Neurological: alert, oriented x 3, CN2-12 intact, strength normal upper extremities and lower extremities, sensation normal throughout, DTRs 2+ throughout, no cerebellar signs, gait slow steady, subtle head and vocal tremor Psychiatric: normal affect, behavior normal, pleasant   Medicare Attestation I have personally reviewed: The patient's medical and social history Their use of alcohol, tobacco or illicit drugs Their current medications and supplements The patient's functional ability including ADLs,fall risks, home safety risks, cognitive, and hearing and visual impairment Diet and physical activities Evidence for depression or mood disorders  The patient's weight, height, BMI, and visual acuity have been recorded in the chart.  I have made referrals, counseling, and provided education to the patient based on review of the above and I have provided the patient with a written personalized care plan for preventive services.     Dana Ribas, NP   12/09/2019

## 2019-12-09 ENCOUNTER — Encounter: Payer: Self-pay | Admitting: Adult Health

## 2019-12-09 ENCOUNTER — Other Ambulatory Visit: Payer: Self-pay

## 2019-12-09 ENCOUNTER — Ambulatory Visit (INDEPENDENT_AMBULATORY_CARE_PROVIDER_SITE_OTHER): Payer: Medicare Other | Admitting: Adult Health

## 2019-12-09 VITALS — BP 110/58 | HR 59 | Temp 97.9°F | Wt 146.0 lb

## 2019-12-09 DIAGNOSIS — Z23 Encounter for immunization: Secondary | ICD-10-CM

## 2019-12-09 DIAGNOSIS — E663 Overweight: Secondary | ICD-10-CM

## 2019-12-09 DIAGNOSIS — E559 Vitamin D deficiency, unspecified: Secondary | ICD-10-CM | POA: Diagnosis not present

## 2019-12-09 DIAGNOSIS — E782 Mixed hyperlipidemia: Secondary | ICD-10-CM

## 2019-12-09 DIAGNOSIS — Z79899 Other long term (current) drug therapy: Secondary | ICD-10-CM

## 2019-12-09 DIAGNOSIS — D649 Anemia, unspecified: Secondary | ICD-10-CM | POA: Diagnosis not present

## 2019-12-09 DIAGNOSIS — I10 Essential (primary) hypertension: Secondary | ICD-10-CM | POA: Diagnosis not present

## 2019-12-09 DIAGNOSIS — R6889 Other general symptoms and signs: Secondary | ICD-10-CM | POA: Diagnosis not present

## 2019-12-09 DIAGNOSIS — G43809 Other migraine, not intractable, without status migrainosus: Secondary | ICD-10-CM

## 2019-12-09 DIAGNOSIS — M069 Rheumatoid arthritis, unspecified: Secondary | ICD-10-CM | POA: Diagnosis not present

## 2019-12-09 DIAGNOSIS — Z87891 Personal history of nicotine dependence: Secondary | ICD-10-CM

## 2019-12-09 DIAGNOSIS — Z0000 Encounter for general adult medical examination without abnormal findings: Secondary | ICD-10-CM

## 2019-12-09 DIAGNOSIS — M159 Polyosteoarthritis, unspecified: Secondary | ICD-10-CM

## 2019-12-09 DIAGNOSIS — E039 Hypothyroidism, unspecified: Secondary | ICD-10-CM | POA: Diagnosis not present

## 2019-12-09 DIAGNOSIS — Z0001 Encounter for general adult medical examination with abnormal findings: Secondary | ICD-10-CM | POA: Diagnosis not present

## 2019-12-09 DIAGNOSIS — G250 Essential tremor: Secondary | ICD-10-CM | POA: Diagnosis not present

## 2019-12-09 DIAGNOSIS — R7309 Other abnormal glucose: Secondary | ICD-10-CM | POA: Diagnosis not present

## 2019-12-09 DIAGNOSIS — M85852 Other specified disorders of bone density and structure, left thigh: Secondary | ICD-10-CM | POA: Diagnosis not present

## 2019-12-09 MED ORDER — GABAPENTIN 100 MG PO CAPS: 100.0000 mg | ORAL_CAPSULE | Freq: Three times a day (TID) | ORAL | 0 refills | Status: DC | PRN

## 2019-12-09 NOTE — Patient Instructions (Addendum)
[name] , Thank you for taking time to come for your Medicare Wellness Visit. I appreciate your ongoing commitment to your health goals. Please review the following plan we discussed and let me know if I can assist you in the future.   This is a list of the screening recommended for you and due dates:  Health Maintenance  Topic Date Due  . Flu Shot  06/22/2019  . Tetanus Vaccine  11/10/2025  . DEXA scan (bone density measurement)  Completed  . Pneumonia vaccines  Completed     Gabapentin capsules or tablets What is this medicine? GABAPENTIN (GA ba pen tin) is used to control seizures in certain types of epilepsy. It is also used to treat certain types of nerve pain. This medicine may be used for other purposes; ask your health care provider or pharmacist if you have questions. COMMON BRAND NAME(S): Active-PAC with Gabapentin, Gabarone, Neurontin What should I tell my health care provider before I take this medicine? They need to know if you have any of these conditions:  history of drug abuse or alcohol abuse problem  kidney disease  lung or breathing disease  suicidal thoughts, plans, or attempt; a previous suicide attempt by you or a family member  an unusual or allergic reaction to gabapentin, other medicines, foods, dyes, or preservatives  pregnant or trying to get pregnant  breast-feeding How should I use this medicine? Take this medicine by mouth with a glass of water. Follow the directions on the prescription label. You can take it with or without food. If it upsets your stomach, take it with food. Take your medicine at regular intervals. Do not take it more often than directed. Do not stop taking except on your doctor's advice. If you are directed to break the 600 or 800 mg tablets in half as part of your dose, the extra half tablet should be used for the next dose. If you have not used the extra half tablet within 28 days, it should be thrown away. A special  MedGuide will be given to you by the pharmacist with each prescription and refill. Be sure to read this information carefully each time. Talk to your pediatrician regarding the use of this medicine in children. While this drug may be prescribed for children as young as 3 years for selected conditions, precautions do apply. Overdosage: If you think you have taken too much of this medicine contact a poison control center or emergency room at once. NOTE: This medicine is only for you. Do not share this medicine with others. What if I miss a dose? If you miss a dose, take it as soon as you can. If it is almost time for your next dose, take only that dose. Do not take double or extra doses. What may interact with this medicine? This medicine may interact with the following medications:  alcohol  antihistamines for allergy, cough, and cold  certain medicines for anxiety or sleep  certain medicines for depression like amitriptyline, fluoxetine, sertraline  certain medicines for seizures like phenobarbital, primidone  certain medicines for stomach problems  general anesthetics like halothane, isoflurane, methoxyflurane, propofol  local anesthetics like lidocaine, pramoxine, tetracaine  medicines that relax muscles for surgery  narcotic medicines for pain  phenothiazines like chlorpromazine, mesoridazine, prochlorperazine, thioridazine This list may not describe all possible interactions. Give your health care provider a list of all the medicines, herbs, non-prescription drugs, or dietary supplements you use. Also tell them if you smoke, drink alcohol,  or use illegal drugs. Some items may interact with your medicine. What should I watch for while using this medicine? Visit your doctor or health care provider for regular checks on your progress. You may want to keep a record at home of how you feel your condition is responding to treatment. You may want to share this information with your  doctor or health care provider at each visit. You should contact your doctor or health care provider if your seizures get worse or if you have any new types of seizures. Do not stop taking this medicine or any of your seizure medicines unless instructed by your doctor or health care provider. Stopping your medicine suddenly can increase your seizures or their severity. This medicine may cause serious skin reactions. They can happen weeks to months after starting the medicine. Contact your health care provider right away if you notice fevers or flu-like symptoms with a rash. The rash may be red or purple and then turn into blisters or peeling of the skin. Or, you might notice a red rash with swelling of the face, lips or lymph nodes in your neck or under your arms. Wear a medical identification bracelet or chain if you are taking this medicine for seizures, and carry a card that lists all your medications. You may get drowsy, dizzy, or have blurred vision. Do not drive, use machinery, or do anything that needs mental alertness until you know how this medicine affects you. To reduce dizzy or fainting spells, do not sit or stand up quickly, especially if you are an older patient. Alcohol can increase drowsiness and dizziness. Avoid alcoholic drinks. Your mouth may get dry. Chewing sugarless gum or sucking hard candy, and drinking plenty of water will help. The use of this medicine may increase the chance of suicidal thoughts or actions. Pay special attention to how you are responding while on this medicine. Any worsening of mood, or thoughts of suicide or dying should be reported to your health care provider right away. Women who become pregnant while using this medicine may enroll in the [place] Pregnancy Registry by calling [phone]. This registry collects information about the safety of antiepileptic drug use during pregnancy. What side effects may I notice from receiving this  medicine? Side effects that you should report to your doctor or health care professional as soon as possible:  allergic reactions like skin rash, itching or hives, swelling of the face, lips, or tongue  breathing problems  rash, fever, and swollen lymph nodes  redness, blistering, peeling or loosening of the skin, including inside the mouth  suicidal thoughts, mood changes Side effects that usually do not require medical attention (report to your doctor or health care professional if they continue or are bothersome):  dizziness  drowsiness  headache  nausea, vomiting  swelling of ankles, feet, hands  tiredness This list may not describe all possible side effects. Call your doctor for medical advice about side effects. You may report side effects to FDA at 1-800-FDA-1088. Where should I keep my medicine? Keep out of reach of children. This medicine may cause accidental overdose and death if it taken by other adults, children, or pets. Mix any unused medicine with a substance like cat litter or coffee grounds. Then throw the medicine away in a sealed container like a sealed bag or a coffee can with a lid. Do not use the medicine after the expiration date. Store at room temperature between 15 and 30 degrees C (  59 and 86 degrees F). NOTE: This sheet is a summary. It may not cover all possible information. If you have questions about this medicine, talk to your doctor, pharmacist, or health care provider.  2020 Elsevier/Gold Standard (2019-02-08 14:16:43)

## 2019-12-09 NOTE — Addendum Note (Signed)
Addended by: Chancy Hurter on: 12/09/2019 03:19 PM   Modules accepted: Orders

## 2019-12-10 LAB — COMPLETE METABOLIC PANEL WITH GFR
AG Ratio: 2 (calc) (ref 1.0–2.5)
ALT: 11 U/L (ref 6–29)
AST: 19 U/L (ref 10–35)
Albumin: 3.8 g/dL (ref 3.6–5.1)
Alkaline phosphatase (APISO): 77 U/L (ref 37–153)
BUN: 9 mg/dL (ref 7–25)
CO2: 33 mmol/L — ABNORMAL HIGH (ref 20–32)
Calcium: 9.2 mg/dL (ref 8.6–10.4)
Chloride: 105 mmol/L (ref 98–110)
Creat: 0.67 mg/dL (ref 0.60–0.88)
GFR, Est African American: 96 mL/min/{1.73_m2} (ref >=60)
GFR, Est Non African American: 83 mL/min/{1.73_m2} (ref >=60)
Globulin: 1.9 g/dL (calc) (ref 1.9–3.7)
Glucose, Bld: 81 mg/dL (ref 65–99)
Potassium: 4.2 mmol/L (ref 3.5–5.3)
Sodium: 143 mmol/L (ref 135–146)
Total Bilirubin: 0.4 mg/dL (ref 0.2–1.2)
Total Protein: 5.7 g/dL — ABNORMAL LOW (ref 6.1–8.1)

## 2019-12-10 LAB — CBC WITH DIFFERENTIAL/PLATELET
Absolute Monocytes: 445 cells/uL (ref 200–950)
Basophils Absolute: 50 cells/uL (ref 0–200)
Basophils Relative: 1.2 %
Eosinophils Absolute: 101 cells/uL (ref 15–500)
Eosinophils Relative: 2.4 %
HCT: 34.4 % — ABNORMAL LOW (ref 35.0–45.0)
Hemoglobin: 11.3 g/dL — ABNORMAL LOW (ref 11.7–15.5)
Lymphs Abs: 1621 cells/uL (ref 850–3900)
MCH: 31.7 pg (ref 27.0–33.0)
MCHC: 32.8 g/dL (ref 32.0–36.0)
MCV: 96.6 fL (ref 80.0–100.0)
MPV: 9.9 fL (ref 7.5–12.5)
Monocytes Relative: 10.6 %
Neutro Abs: 1982 cells/uL (ref 1500–7800)
Neutrophils Relative %: 47.2 %
Platelets: 265 10*3/uL (ref 140–400)
RBC: 3.56 10*6/uL — ABNORMAL LOW (ref 3.80–5.10)
RDW: 14.5 % (ref 11.0–15.0)
Total Lymphocyte: 38.6 %
WBC: 4.2 10*3/uL (ref 3.8–10.8)

## 2019-12-10 LAB — LIPID PANEL
Cholesterol: 160 mg/dL (ref <200)
HDL: 60 mg/dL (ref >=50)
LDL Cholesterol (Calc): 78 mg/dL (calc)
Non-HDL Cholesterol (Calc): 100 mg/dL (calc) (ref <130)
Total CHOL/HDL Ratio: 2.7 (calc) (ref <5.0)
Triglycerides: 121 mg/dL (ref <150)

## 2019-12-10 LAB — TSH: TSH: 2.26 mIU/L (ref 0.40–4.50)

## 2019-12-10 LAB — MAGNESIUM: Magnesium: 2.3 mg/dL (ref 1.5–2.5)

## 2020-01-21 ENCOUNTER — Ambulatory Visit: Payer: Self-pay | Admitting: Adult Health Nurse Practitioner

## 2020-02-05 ENCOUNTER — Other Ambulatory Visit: Payer: Self-pay | Admitting: Internal Medicine

## 2020-02-05 DIAGNOSIS — G250 Essential tremor: Secondary | ICD-10-CM

## 2020-02-05 DIAGNOSIS — E039 Hypothyroidism, unspecified: Secondary | ICD-10-CM

## 2020-02-05 MED ORDER — DIAZEPAM 5 MG PO TABS: ORAL_TABLET | ORAL | 1 refills | Status: DC

## 2020-02-05 MED ORDER — LEVOTHYROXINE SODIUM 100 MCG PO TABS: ORAL_TABLET | ORAL | 0 refills | Status: DC

## 2020-02-17 DIAGNOSIS — M1710 Unilateral primary osteoarthritis, unspecified knee: Secondary | ICD-10-CM | POA: Diagnosis not present

## 2020-02-17 DIAGNOSIS — M069 Rheumatoid arthritis, unspecified: Secondary | ICD-10-CM | POA: Diagnosis not present

## 2020-02-17 DIAGNOSIS — M7989 Other specified soft tissue disorders: Secondary | ICD-10-CM | POA: Diagnosis not present

## 2020-02-17 DIAGNOSIS — G629 Polyneuropathy, unspecified: Secondary | ICD-10-CM | POA: Diagnosis not present

## 2020-02-17 DIAGNOSIS — M0579 Rheumatoid arthritis with rheumatoid factor of multiple sites without organ or systems involvement: Secondary | ICD-10-CM | POA: Diagnosis not present

## 2020-02-17 DIAGNOSIS — M8000XA Age-related osteoporosis with current pathological fracture, unspecified site, initial encounter for fracture: Secondary | ICD-10-CM | POA: Diagnosis not present

## 2020-02-17 DIAGNOSIS — Z79899 Other long term (current) drug therapy: Secondary | ICD-10-CM | POA: Diagnosis not present

## 2020-02-17 DIAGNOSIS — M0589 Other rheumatoid arthritis with rheumatoid factor of multiple sites: Secondary | ICD-10-CM | POA: Diagnosis not present

## 2020-03-06 ENCOUNTER — Other Ambulatory Visit: Payer: Self-pay | Admitting: *Deleted

## 2020-03-06 MED ORDER — MONTELUKAST SODIUM 10 MG PO TABS: 10.0000 mg | ORAL_TABLET | Freq: Every day | ORAL | 3 refills | Status: DC

## 2020-03-15 ENCOUNTER — Encounter: Payer: Self-pay | Admitting: Internal Medicine

## 2020-03-15 NOTE — Progress Notes (Signed)
History of Present Illness:       This very nice 79 y.o. WWF  presents for 3 month follow up with HTN, HLD, Rheumatoid Arthritis, Hypothyroidism,  Familial Tremor, Pre-Diabetes and Vitamin D Deficiency.       Patient was dx'd with Rheumatoid arthritis in 2001 and continues f/u at [facility]. She also is treated for Familial Hereditary Tremor and doing fair on meds.      Patient is treated for HTN (1998) & BP has been controlled at home. Today's BP is at goal - 124/66. Patient has had no complaints of any cardiac type chest pain, palpitations, dyspnea / orthopnea / PND, dizziness, claudication, or dependent edema.      Hyperlipidemia is controlled with diet & Pravastatin. Patient denies myalgias or other med SE's. Last Lipids were at goal:  Lab Results  Component Value Date   CHOL 160 12/09/2019   HDL 60 12/09/2019   LDLCALC 78 12/09/2019   TRIG 121 12/09/2019   CHOLHDL 2.7 12/09/2019    Also, the patient has history of PreDiabetes (A1c 5.7% / 2012)  and has had no symptoms of reactive hypoglycemia, diabetic polys, paresthesias or visual blurring.  Last A1c was Normal & at goal:  Lab Results  Component Value Date   HGBA1C 5.3 09/04/2019       Patient was dx'd Hypothyroid in 1994 and has been on thyroid replacement.       Further, the patient also has history of Vitamin D Deficiency ("17" / 2009)  and supplements vitamin D without any suspected side-effects. Last vitamin D was still slightly low (goal 70-100):   Lab Results  Component Value Date   VD25OH 48 09/04/2019    Current Outpatient Medications on File Prior to Visit  Medication Sig  . Cholecalciferol (VITAMIN D) 2000 UNITS CAPS Take by mouth. Take 5 capsules by mouth once daily  . Cyanocobalamin (VITAMIN B 12 PO) Place 1 tablet under the tongue daily.  . diazepam (VALIUM) 5 MG tablet Take 1 tablet 3 x /day for Tremor  . folic acid (FOLVITE) [id number] MCG tablet Take 800 mcg by mouth daily.   . furosemide (LASIX) 40 MG  tablet TAKE 1 TABLET BY MOUTH 2  TIMES DAILY FOR FLUID  RETENTION AND ANKLE  SWELLING  . hydroxychloroquine (PLAQUENIL) 200 MG tablet TAKE 1 TABLET BY MOUTH  TWICE A DAY  . levothyroxine (SYNTHROID) 100 MCG tablet Take 1 tablet daily on an empty stomach with only water for 30 minutes & no Antacid meds, Calcium or Magnesium for 4 hours & avoid Biotin  . methotrexate (RHEUMATREX) 2.5 MG tablet Take 5 tablets on Tues and 5 tablets on Wed  . montelukast (SINGULAIR) 10 MG tablet Take 1 tablet (10 mg total) by mouth daily.  . ondansetron (ZOFRAN ODT) 8 MG disintegrating tablet 8mg  ODT q4 hours prn nausea  . pravastatin (PRAVACHOL) 40 MG tablet TAKE 1 TABLET BY MOUTH  DAILY  . propranolol (INDERAL) 80 MG tablet TAKE 1 TABLET BY MOUTH 3  TIMES DAILY  . gabapentin (NEURONTIN) 100 MG capsule Take 1-2 capsules (100-200 mg total) by mouth 3 (three) times daily as needed. For pain/neuropathy (Patient not taking: Reported on 03/16/2020)   No current facility-administered medications on file prior to visit.    Allergies  Allergen Reactions  . Codeine Nausea Only  . Mysoline [Primidone] Nausea Only and Other (See Comments)    Over-sedated    PMHx:   Past Medical History:  Diagnosis  Date  . Anemia   . Cataract   . Environmental allergies   . Gastric ulcer   . Heartburn   . Hiatal hernia   . Hyperlipidemia   . Hypertension   . Hypothyroidism   . Migraines   . Prediabetes   . Rash october 2012   all over abdomen and breasts  . Rheumatoid arthritis(714.0)   . Tremor, essential    right hand    Immunization History  Administered Date(s) Administered  . DT (Pediatric) 02/05/2015  . Influenza Split 09/12/2013  . Influenza, High Dose Seasonal PF 10/28/2014, 08/18/2016, 11/22/2018, 12/09/2019  . Pneumococcal Conjugate-13 05/12/2015  . Pneumococcal-Unspecified 11/21/2006  . Td 11/22/2003  . Varicella 06/23/2011  . Zoster 06/23/2011    Past Surgical History:  Procedure Laterality Date  .  ABDOMINAL HYSTERECTOMY    . CATARACT EXTRACTION, BILATERAL    . CHOLECYSTECTOMY  08/31/11   lap chole   . COLONOSCOPY    . TUBAL LIGATION      FHx:    Reviewed / unchanged  SHx:    Reviewed / unchanged   Systems Review:  Constitutional: Denies fever, chills, wt changes, headaches, insomnia, fatigue, night sweats, change in appetite. Eyes: Denies redness, blurred vision, diplopia, discharge, itchy, watery eyes.  ENT: Denies discharge, congestion, post nasal drip, epistaxis, sore throat, earache, hearing loss, dental pain, tinnitus, vertigo, sinus pain, snoring.  CV: Denies chest pain, palpitations, irregular heartbeat, syncope, dyspnea, diaphoresis, orthopnea, PND, claudication or edema. Respiratory: denies cough, dyspnea, DOE, pleurisy, hoarseness, laryngitis, wheezing.  Gastrointestinal: Denies dysphagia, odynophagia, heartburn, reflux, water brash, abdominal pain or cramps, nausea, vomiting, bloating, diarrhea, constipation, hematemesis, melena, hematochezia  or hemorrhoids. Genitourinary: Denies dysuria, frequency, urgency, nocturia, hesitancy, discharge, hematuria or flank pain. Musculoskeletal: Denies arthralgias, myalgias, stiffness, jt. swelling, pain, limping or strain/sprain.  Skin: Denies pruritus, rash, hives, warts, acne, eczema or change in skin lesion(s). Neuro: No weakness, tremor, incoordination, spasms, paresthesia or pain. Psychiatric: Denies confusion, memory loss or sensory loss. Endo: Denies change in weight, skin or hair change.  Heme/Lymph: No excessive bleeding, bruising or enlarged lymph nodes.  Physical Exam  BP 124/66   Pulse 64   Temp (!) 97.2 F (36.2 C)   Resp 16   Ht 5\' 3"  (1.6 m)   Wt 146 lb 6.4 oz (66.4 kg)   BMI 25.93 kg/m   Appears  well nourished, well groomed  and in no distress.  Eyes: PERRLA, EOMs, conjunctiva no swelling or erythema. Sinuses: No frontal/maxillary tenderness ENT/Mouth: EAC's clear, TM's nl w/o erythema, bulging. Nares  clear w/o erythema, swelling, exudates. Oropharynx clear without erythema or exudates. Oral hygiene is good. Tongue normal, non obstructing. Hearing intact.  Neck: Supple. Thyroid not palpable. Car 2+/2+ without bruits, nodes or JVD. Chest: Respirations nl with BS clear & equal w/o rales, rhonchi, wheezing or stridor.  Cor: Heart sounds normal w/ regular rate and rhythm without sig. murmurs, gallops, clicks or rubs. Peripheral pulses normal and equal  without edema.  Abdomen: Soft & bowel sounds normal. Non-tender w/o guarding, rebound, hernias, masses or organomegaly.  Lymphatics: Unremarkable.  Musculoskeletal: Full ROM all peripheral extremities, joint stability, 5/5 strength and normal gait.  Skin: Warm, dry without exposed rashes, lesions or ecchymosis apparent.  Neuro: Cranial nerves intact, reflexes equal bilaterally. Sensory-motor testing grossly intact. Tendon reflexes grossly intact.  (+) Head/chin titubation,  halting speech and high frequency/low amplitude acrion tremor of hands.  Pysch: Alert & oriented x 3.  Insight and judgement nl & appropriate.  No ideations.  Assessment and Plan:  1. Essential hypertension  - Continue medication, monitor blood pressure at home.  - Continue DASH diet.  Reminder to go to the ER if any CP,  SOB, nausea, dizziness, severe HA, changes vision/speech.  - CBC with Differential/Platelet - COMPLETE METABOLIC PANEL WITH GFR - Magnesium - TSH  2. Hyperlipidemia, mixed  - Continue diet/meds, exercise,& lifestyle modifications.  - Continue monitor periodic cholesterol/liver & renal functions   - Lipid panel - TSH  3. Abnormal glucose  - Hemoglobin A1c - Insulin, random  4. Vitamin D deficiency  - Continue diet, exercise  - Lifestyle modifications.  - Monitor appropriate labs. - Continue supplementation.  - VITAMIN D 25 Hydroxy  5. Rheumatoid arthritis involving multiple sites,(HCC)   6. Hereditary essential tremor   7.  Hypothyroidism  - Lipid panel  8. Medication management  - CBC with Differential/Platelet - COMPLETE METABOLIC PANEL WITH GFR - Magnesium - Lipid panel - TSH - Hemoglobin A1c - Insulin, random - VITAMIN D 25 Hydroxy         Discussed  regular exercise, BP monitoring, weight control to achieve/maintain BMI less than 25 and discussed med and SE's. Recommended labs to assess and monitor clinical status with further disposition pending results of labs.  I discussed the assessment and treatment plan with the patient. The patient was provided an opportunity to ask questions and all were answered. The patient agreed with the plan and demonstrated an understanding of the instructions.  I provided over 30 minutes of exam, counseling, chart review and  complex critical decision making.   Kirtland Bouchard, MD

## 2020-03-15 NOTE — Patient Instructions (Signed)

## 2020-03-16 ENCOUNTER — Other Ambulatory Visit: Payer: Self-pay

## 2020-03-16 ENCOUNTER — Ambulatory Visit (INDEPENDENT_AMBULATORY_CARE_PROVIDER_SITE_OTHER): Payer: Medicare HMO | Admitting: Internal Medicine

## 2020-03-16 VITALS — BP 124/66 | HR 64 | Temp 97.2°F | Resp 16 | Ht 63.0 in | Wt 146.4 lb

## 2020-03-16 DIAGNOSIS — R7309 Other abnormal glucose: Secondary | ICD-10-CM

## 2020-03-16 DIAGNOSIS — E559 Vitamin D deficiency, unspecified: Secondary | ICD-10-CM

## 2020-03-16 DIAGNOSIS — M069 Rheumatoid arthritis, unspecified: Secondary | ICD-10-CM

## 2020-03-16 DIAGNOSIS — E782 Mixed hyperlipidemia: Secondary | ICD-10-CM

## 2020-03-16 DIAGNOSIS — G608 Other hereditary and idiopathic neuropathies: Secondary | ICD-10-CM

## 2020-03-16 DIAGNOSIS — G250 Essential tremor: Secondary | ICD-10-CM | POA: Diagnosis not present

## 2020-03-16 DIAGNOSIS — I10 Essential (primary) hypertension: Secondary | ICD-10-CM | POA: Diagnosis not present

## 2020-03-16 DIAGNOSIS — E039 Hypothyroidism, unspecified: Secondary | ICD-10-CM | POA: Diagnosis not present

## 2020-03-16 DIAGNOSIS — Z79899 Other long term (current) drug therapy: Secondary | ICD-10-CM

## 2020-03-16 MED ORDER — GABAPENTIN 300 MG PO CAPS: ORAL_CAPSULE | ORAL | 0 refills | Status: DC

## 2020-03-17 LAB — COMPLETE METABOLIC PANEL WITH GFR
AG Ratio: 1.9 (calc) (ref 1.0–2.5)
ALT: 13 U/L (ref 6–29)
AST: 19 U/L (ref 10–35)
Albumin: 4 g/dL (ref 3.6–5.1)
Alkaline phosphatase (APISO): 79 U/L (ref 37–153)
BUN: 11 mg/dL (ref 7–25)
CO2: 31 mmol/L (ref 20–32)
Calcium: 9 mg/dL (ref 8.6–10.4)
Chloride: 104 mmol/L (ref 98–110)
Creat: 0.83 mg/dL (ref 0.60–0.88)
GFR, Est African American: 77 mL/min/{1.73_m2} (ref >=60)
GFR, Est Non African American: 66 mL/min/{1.73_m2} (ref >=60)
Globulin: 2.1 g/dL (calc) (ref 1.9–3.7)
Glucose, Bld: 92 mg/dL (ref 65–99)
Potassium: 3.8 mmol/L (ref 3.5–5.3)
Sodium: 141 mmol/L (ref 135–146)
Total Bilirubin: 0.5 mg/dL (ref 0.2–1.2)
Total Protein: 6.1 g/dL (ref 6.1–8.1)

## 2020-03-17 LAB — LIPID PANEL
Cholesterol: 162 mg/dL (ref <200)
HDL: 56 mg/dL (ref >=50)
LDL Cholesterol (Calc): 87 mg/dL (calc)
Non-HDL Cholesterol (Calc): 106 mg/dL (calc) (ref <130)
Total CHOL/HDL Ratio: 2.9 (calc) (ref <5.0)
Triglycerides: 95 mg/dL (ref <150)

## 2020-03-17 LAB — CBC WITH DIFFERENTIAL/PLATELET
Absolute Monocytes: 609 cells/uL (ref 200–950)
Basophils Absolute: 31 cells/uL (ref 0–200)
Basophils Relative: 0.9 %
Eosinophils Absolute: 20 cells/uL (ref 15–500)
Eosinophils Relative: 0.6 %
HCT: 33.9 % — ABNORMAL LOW (ref 35.0–45.0)
Hemoglobin: 11.1 g/dL — ABNORMAL LOW (ref 11.7–15.5)
Lymphs Abs: 1299 cells/uL (ref 850–3900)
MCH: 31.4 pg (ref 27.0–33.0)
MCHC: 32.7 g/dL (ref 32.0–36.0)
MCV: 96 fL (ref 80.0–100.0)
MPV: 9.8 fL (ref 7.5–12.5)
Monocytes Relative: 17.9 %
Neutro Abs: 1442 cells/uL — ABNORMAL LOW (ref 1500–7800)
Neutrophils Relative %: 42.4 %
Platelets: 218 10*3/uL (ref 140–400)
RBC: 3.53 10*6/uL — ABNORMAL LOW (ref 3.80–5.10)
RDW: 14.8 % (ref 11.0–15.0)
Total Lymphocyte: 38.2 %
WBC: 3.4 10*3/uL — ABNORMAL LOW (ref 3.8–10.8)

## 2020-03-17 LAB — HEMOGLOBIN A1C
Hgb A1c MFr Bld: 5.2 % of total Hgb (ref <5.7)
Mean Plasma Glucose: 103 (calc)
eAG (mmol/L): 5.7 (calc)

## 2020-03-17 LAB — TSH: TSH: 1.54 mIU/L (ref 0.40–4.50)

## 2020-03-17 LAB — VITAMIN D 25 HYDROXY (VIT D DEFICIENCY, FRACTURES): Vit D, 25-Hydroxy: 47 ng/mL (ref 30–100)

## 2020-03-17 LAB — INSULIN, RANDOM: Insulin: 3.3 u[IU]/mL

## 2020-03-17 LAB — MAGNESIUM: Magnesium: 2.2 mg/dL (ref 1.5–2.5)

## 2020-03-30 DIAGNOSIS — M170 Bilateral primary osteoarthritis of knee: Secondary | ICD-10-CM | POA: Diagnosis not present

## 2020-03-30 DIAGNOSIS — M1711 Unilateral primary osteoarthritis, right knee: Secondary | ICD-10-CM | POA: Diagnosis not present

## 2020-03-30 DIAGNOSIS — M25562 Pain in left knee: Secondary | ICD-10-CM | POA: Diagnosis not present

## 2020-03-30 DIAGNOSIS — M25561 Pain in right knee: Secondary | ICD-10-CM | POA: Diagnosis not present

## 2020-03-30 DIAGNOSIS — M1712 Unilateral primary osteoarthritis, left knee: Secondary | ICD-10-CM | POA: Diagnosis not present

## 2020-04-17 DIAGNOSIS — M1711 Unilateral primary osteoarthritis, right knee: Secondary | ICD-10-CM | POA: Diagnosis not present

## 2020-04-17 DIAGNOSIS — M170 Bilateral primary osteoarthritis of knee: Secondary | ICD-10-CM | POA: Diagnosis not present

## 2020-04-29 DIAGNOSIS — M25561 Pain in right knee: Secondary | ICD-10-CM | POA: Diagnosis not present

## 2020-05-08 DIAGNOSIS — M25561 Pain in right knee: Secondary | ICD-10-CM | POA: Diagnosis not present

## 2020-05-13 DIAGNOSIS — M84361A Stress fracture, right tibia, initial encounter for fracture: Secondary | ICD-10-CM | POA: Diagnosis not present

## 2020-05-13 DIAGNOSIS — M79672 Pain in left foot: Secondary | ICD-10-CM | POA: Diagnosis not present

## 2020-05-19 ENCOUNTER — Other Ambulatory Visit: Payer: Self-pay | Admitting: *Deleted

## 2020-05-19 DIAGNOSIS — E039 Hypothyroidism, unspecified: Secondary | ICD-10-CM

## 2020-05-19 DIAGNOSIS — R609 Edema, unspecified: Secondary | ICD-10-CM

## 2020-05-19 MED ORDER — FOLIC ACID 800 MCG PO TABS: ORAL_TABLET | ORAL | 1 refills | Status: DC

## 2020-05-19 MED ORDER — LEVOTHYROXINE SODIUM 100 MCG PO TABS: ORAL_TABLET | ORAL | 1 refills | Status: DC

## 2020-05-19 MED ORDER — FUROSEMIDE 40 MG PO TABS: ORAL_TABLET | ORAL | 3 refills | Status: DC

## 2020-05-26 DIAGNOSIS — M069 Rheumatoid arthritis, unspecified: Secondary | ICD-10-CM | POA: Diagnosis not present

## 2020-06-12 DIAGNOSIS — M25561 Pain in right knee: Secondary | ICD-10-CM | POA: Diagnosis not present

## 2020-06-23 ENCOUNTER — Ambulatory Visit (INDEPENDENT_AMBULATORY_CARE_PROVIDER_SITE_OTHER): Payer: Medicare HMO | Admitting: Adult Health Nurse Practitioner

## 2020-06-23 ENCOUNTER — Encounter: Payer: Self-pay | Admitting: Adult Health Nurse Practitioner

## 2020-06-23 ENCOUNTER — Other Ambulatory Visit: Payer: Self-pay

## 2020-06-23 VITALS — BP 102/58 | HR 58 | Temp 97.7°F | Ht 63.0 in | Wt 140.0 lb

## 2020-06-23 DIAGNOSIS — G250 Essential tremor: Secondary | ICD-10-CM

## 2020-06-23 DIAGNOSIS — I10 Essential (primary) hypertension: Secondary | ICD-10-CM | POA: Diagnosis not present

## 2020-06-23 DIAGNOSIS — E782 Mixed hyperlipidemia: Secondary | ICD-10-CM | POA: Diagnosis not present

## 2020-06-23 DIAGNOSIS — R300 Dysuria: Secondary | ICD-10-CM

## 2020-06-23 DIAGNOSIS — M85852 Other specified disorders of bone density and structure, left thigh: Secondary | ICD-10-CM

## 2020-06-23 DIAGNOSIS — D649 Anemia, unspecified: Secondary | ICD-10-CM

## 2020-06-23 DIAGNOSIS — E559 Vitamin D deficiency, unspecified: Secondary | ICD-10-CM

## 2020-06-23 DIAGNOSIS — Z79899 Other long term (current) drug therapy: Secondary | ICD-10-CM

## 2020-06-23 DIAGNOSIS — M159 Polyosteoarthritis, unspecified: Secondary | ICD-10-CM | POA: Diagnosis not present

## 2020-06-23 DIAGNOSIS — J3089 Other allergic rhinitis: Secondary | ICD-10-CM

## 2020-06-23 DIAGNOSIS — E039 Hypothyroidism, unspecified: Secondary | ICD-10-CM | POA: Diagnosis not present

## 2020-06-23 DIAGNOSIS — R7309 Other abnormal glucose: Secondary | ICD-10-CM | POA: Diagnosis not present

## 2020-06-23 NOTE — Patient Instructions (Addendum)
   We will contact you with your lab results in 1-3 days.   Soak your right toe in epsom salt and warm water for 15-41min.  To help with the ingrown toenail.   The place on the center of your back we treated with liquid nitrogen.  The skin will start to peel after one week.  It will take 1-2 weeks to completely heal.  Monitor the area.   Follow up with dermatology.   Continue using walker and recommendations from orthopedics regarding your right knee.

## 2020-06-23 NOTE — Progress Notes (Signed)
3 MONTH FOLLOW UP  Assessment / Plan:   Zimal was seen today for 3 month follow-up Diagnoses and all orders for this visit:  Essential hypertension Doing well continue Current regiment Will continue to monitor Low sodium diet  Hyperlipidemia, mixed Taking Praavachol 40mg  Discussed dietary and exercise modifications Check lipid panel   Other abnormal glucose; hx of prediabetes Discussed dietary and exercise modifications Will monitor A1c annually or q43m if elevated  Hypothyroidism, unspecified type continue medications the same pending lab results reminded to take on an empty stomach 30-38mins before food.  check TSH level  Vitamin D deficiency Continue supplimentation  Tremor, essential Taking INderol and valium for this Doing well Managing, no complications at this time.  Osteopenia of left hip Continue Calcium and Vit D supplimentation DEXA UTD  Non-seasonal allergic rhinitis, unspecified trigger Taking singular Doing well at this time  Anemia Normocytic; monitor   Medication management Continued   Over 30 minutes of face to face interview, exam, counseling, chart review and critical decision making was performed Future Appointments  Date Time Provider [place]  10/06/2020 10:00 AM Unk Pinto, MD [facility] None    Subjective:  Dana [name] is a 79 y.o. female who presents for 3 month follow up for HTN, HLD, Hypothyroidism, Pre-Diabetes, Vitamin D Defciency, RA, and hereditary essential tremor.   Stress fx in right knee.  Dr Latanya Maudlin.  She is ambulating with a walker to help keep pressure off the area for 6-8 weeks.  She has follow up scheduled.  Once she has completed this they will evaluate if she is ready for physical therapy.  Reports she is doing well but does not like ambulating with the walker.  She feels like she has improved some.  She is following with [facility] surgery center for cryo for precancerous lesions.   Patient  diagnosed with RA in 2001 and stable on MTX and plaquenil and follows with [facility] last OV was 01/2020 and next OV is 07/2020 .She has an essential tremor and taking propanolol for this 80mg  TID, also valium PRN.   BMI is Body mass index is 24.8 kg/m., she has not been working on diet and exercise, not exercising much due to covid 19 and not related to her knee.  Wt Readings from Last 3 Encounters:  06/23/20 140 lb (63.5 kg)  03/16/20 146 lb 6.4 oz (66.4 kg)  12/09/19 146 lb (66.2 kg)   She has had elevated blood pressure since 1998. Her blood pressure has been controlled at home, today their BP is BP: (!) 102/58 She does not workout. She denies chest pain, shortness of breath, dizziness.   She is on cholesterol medication (pravastatin 40 mg daily) and denies myalgias. Her cholesterol is at goal. The cholesterol last visit was:   Lab Results  Component Value Date   CHOL 162 03/16/2020   HDL 56 03/16/2020   LDLCALC 87 03/16/2020   TRIG 95 03/16/2020   CHOLHDL 2.9 03/16/2020   She has had brief intermittent hx prediabetes since 2012 (5.8% in 2015). She has been working on diet and exercise for glucose management, and denies hyperglycemia, hypoglycemia , increased appetite, nausea, paresthesia of the feet, polydipsia and polyuria. Last A1C in the office was:  Lab Results  Component Value Date   HGBA1C 5.2 03/16/2020   She is on thyroid medication. Her medication was not changed last visit.   Lab Results  Component Value Date   TSH 1.54 03/16/2020   Last GFR: Lab Results  Component Value Date   GFRNONAA 66 03/16/2020   Patient is on Vitamin D supplement.   Lab Results  Component Value Date   VD25OH 47 03/16/2020      She has intermitent normocytic anemia; she is on R74 and folic acid supplements;  Lab Results  Component Value Date   WBC 3.4 (L) 03/16/2020   HGB 11.1 (L) 03/16/2020   HCT 33.9 (L) 03/16/2020   MCV 96.0 03/16/2020   PLT 218 03/16/2020    Medication  Review: Current Outpatient Medications on File Prior to Visit  Medication Sig Dispense Refill   Acetaminophen (TYLENOL PO) Take 500 mg by mouth. Take two tablets at bedtime     Cholecalciferol (VITAMIN D) 2000 UNITS CAPS Take by mouth. Take 5 capsules by mouth once daily     Cyanocobalamin (VITAMIN B 12 PO) Place 1 tablet under the tongue daily.     diazepam (VALIUM) 5 MG tablet Take 1 tablet 3 x /day for Tremor 081 tablet 1   folic acid (FOLVITE) 448 MCG tablet Take 1 tablet daily. 90 tablet 1   furosemide (LASIX) 40 MG tablet TAKE 1 TABLET 2  TIMES DAILY FOR FLUID  RETENTION AND ANKLE  SWELLING 180 tablet 3   gabapentin (NEURONTIN) 300 MG capsule Take 1 capsule 3 x  /day for Neuropathy Pain  or Itching 270 capsule 0   hydroxychloroquine (PLAQUENIL) 200 MG tablet TAKE 1 TABLET BY MOUTH  TWICE A DAY     levothyroxine (SYNTHROID) 100 MCG tablet Take 1 tablet daily on an empty stomach with only water for 30 minutes & no Antacid meds, Calcium or Magnesium for 4 hours & avoid Biotin 90 tablet 1   methotrexate (RHEUMATREX) 2.5 MG tablet Take 5 tablets on Tues and 5 tablets on Wed     montelukast (SINGULAIR) 10 MG tablet Take 1 tablet (10 mg total) by mouth daily. 90 tablet 3   ondansetron (ZOFRAN ODT) 8 MG disintegrating tablet 8mg  ODT q4 hours prn nausea 30 tablet 3   pravastatin (PRAVACHOL) 40 MG tablet TAKE 1 TABLET BY MOUTH  DAILY 90 tablet 3   propranolol (INDERAL) 80 MG tablet TAKE 1 TABLET BY MOUTH 3  TIMES DAILY 270 tablet 3   No current facility-administered medications on file prior to visit.    Allergies  Allergen Reactions   Codeine Nausea Only   Mysoline [Primidone] Nausea Only and Other (See Comments)    Over-sedated    Current Problems (verified) Patient Active Problem List   Diagnosis Date Noted   Osteopenia 12/05/2018   FHx: heart disease 09/03/2018   Former smoker 09/03/2018   Overweight (BMI 25.0-29.9) 05/30/2018   Generalized OA 09/02/2015    Other long term (current) drug therapy 09/02/2015   Vitamin D deficiency 01/06/2014   Medication management 01/06/2014   Hypertension    Other abnormal glucose    Rheumatoid arthritis ([place])    Hereditary essential tremor    Hypothyroidism    Migraines    Hyperlipidemia, mixed    Anemia     Screening Tests Immunization History  Administered Date(s) Administered   DT (Pediatric) 02/05/2015   Influenza Split 09/12/2013   Influenza, High Dose Seasonal PF 10/28/2014, 08/18/2016, 11/22/2018, 12/09/2019   Pneumococcal Conjugate-13 05/12/2015   Pneumococcal-Unspecified 11/21/2006   Td 11/22/2003   Varicella 06/23/2011   Zoster 06/23/2011    Preventative care: Last colonoscopy: 2013, normal except mild diverticulosis of sigmoid; DONE Last mammogram: 08/2019 Last pap smear/pelvic exam: Hysterectomy   DEXA: 08/2018  L fem neck T-1.9  Prior vaccinations: TD or Tdap: 2016  Influenza: 11/2018 due TODAY  Pneumococcal: 2008 Prevnar13: 2016 Shingles/Zostavax: 2017  Names of Other Physician/Practitioners you currently use: 1. [facility] here for primary care 2. Eye Exam, Dr. Ellie Lunch every six months, last 06/2019 3. Dentist, Due  Patient Care Team: Unk Pinto, MD as PCP - General (Internal Medicine) Roetta Sessions, MD as Referring Physician (Internal Medicine)  SURGICAL HISTORY She  has a past surgical history that includes Abdominal hysterectomy; Tubal ligation; Cataract extraction, bilateral; Cholecystectomy (08/31/11); and Colonoscopy. FAMILY HISTORY Her family history includes Diabetes in her mother and sister; Heart attack (age of onset: 3) in her son; Heart disease in her father, maternal aunt, maternal uncle, and mother. SOCIAL HISTORY She  reports that she quit smoking about 35 years ago. Her smoking use included cigarettes. She has never used smokeless tobacco. She reports that she does not drink alcohol  and does not use drugs.     Review of Systems  Constitutional: Negative for chills, diaphoresis, fever, malaise/fatigue and weight loss.  HENT: Negative for congestion, ear discharge, ear pain, hearing loss, nosebleeds, sinus pain, sore throat and tinnitus.   Eyes: Negative for blurred vision, double vision, photophobia, pain, discharge and redness.  Respiratory: Negative for cough, hemoptysis, sputum production, shortness of breath, wheezing and stridor.   Cardiovascular: Negative for chest pain, palpitations, orthopnea, claudication, leg swelling and PND.  Gastrointestinal: Negative for abdominal pain, blood in stool, constipation, diarrhea, heartburn, melena, nausea and vomiting.  Genitourinary: Negative for dysuria, flank pain, frequency, hematuria and urgency.  Musculoskeletal: Positive for joint pain. Negative for back pain, falls, myalgias and neck pain.       Right knee  Skin: Positive for itching. Negative for rash.       Spot in center of back  Neurological: Positive for tremors. Negative for dizziness, tingling, sensory change, speech change, focal weakness, seizures, loss of consciousness, weakness and headaches.  Endo/Heme/Allergies: Negative for environmental allergies and polydipsia. Does not bruise/bleed easily.  Psychiatric/Behavioral: Negative for depression, hallucinations, memory loss, substance abuse and suicidal ideas. The patient is not nervous/anxious and does not have insomnia.      Objective:     Today's Vitals   06/23/20 1442  BP: (!) 102/58  Pulse: (!) 58  Temp: 97.7 F (36.5 C)  SpO2: 97%  Weight: 140 lb (63.5 kg)  Height: 5\' 3"  (1.6 m)   Body mass index is 24.8 kg/m.  General appearance: alert, no distress, WD/WN, female HEENT: normocephalic, sclerae anicteric, TMs pearly, nares patent, no discharge or erythema, pharynx normal Oral cavity: MMM, no lesions Neck: supple, no lymphadenopathy, no thyromegaly, no masses Heart: RRR, normal S1, S2, no  murmurs Lungs: CTA bilaterally, no wheezes, rhonchi, or rales Abdomen: +bs, soft, non tender, non distended, no masses, no hepatomegaly, no splenomegaly Skin: warm and dry.  Scaly irregular lesion midline upper back, approx 1cm. Musculoskeletal: nontender, no swelling, no obvious deformity Extremities: no edema, no cyanosis, no clubbing Pulses: 2+ symmetric, upper and lower extremities, normal cap refill Neurological: alert, oriented x 3, CN2-12 intact, strength normal upper extremities and lower extremities, sensation normal throughout, DTRs 2+ throughout, no cerebellar signs, gait slow steady, subtle head and vocal tremor Psychiatric: normal affect, behavior normal, pleasant   Chief Complaint: Patient presents for evaluation of skin lesions. Patient has erythematous, scaly lesions on center of upper back., changing in shape.   Exam: normal complete skin exam, no suspicious lesions, seborrheic keratosis  Procedure Details   The risks, benefits, indications, potential complications, and alternatives were explained to the patient and informed consent obtained.  Liquid nitrogen was use in a  triple freeze and thaw technique. The patient tolerated the procedure well.   Condition: Stable  Complications:  None  Diagnosis: Seb. Keratoses,  irritated  Procedure code:  93235  Plan: 1. Patient educated that the area will begin to heal in approximately a week.  2. Warning signs of infection were reviewed.    3. Recommended that the patient use OTC acetaminophen and Tylenol #3 as needed for pain.        Dana Sierras, NP   06/23/2020

## 2020-06-24 LAB — COMPLETE METABOLIC PANEL WITH GFR
AG Ratio: 2.1 (calc) (ref 1.0–2.5)
ALT: 12 U/L (ref 6–29)
AST: 23 U/L (ref 10–35)
Albumin: 3.8 g/dL (ref 3.6–5.1)
Alkaline phosphatase (APISO): 75 U/L (ref 37–153)
BUN: 9 mg/dL (ref 7–25)
CO2: 33 mmol/L — ABNORMAL HIGH (ref 20–32)
Calcium: 9.1 mg/dL (ref 8.6–10.4)
Chloride: 105 mmol/L (ref 98–110)
Creat: 0.71 mg/dL (ref 0.60–0.88)
GFR, Est African American: 93 mL/min/{1.73_m2} (ref >=60)
GFR, Est Non African American: 80 mL/min/{1.73_m2} (ref >=60)
Globulin: 1.8 g/dL (calc) — ABNORMAL LOW (ref 1.9–3.7)
Glucose, Bld: 80 mg/dL (ref 65–99)
Potassium: 3.9 mmol/L (ref 3.5–5.3)
Sodium: 144 mmol/L (ref 135–146)
Total Bilirubin: 0.4 mg/dL (ref 0.2–1.2)
Total Protein: 5.6 g/dL — ABNORMAL LOW (ref 6.1–8.1)

## 2020-06-24 LAB — CBC WITH DIFFERENTIAL/PLATELET
Absolute Monocytes: 310 cells/uL (ref 200–950)
Basophils Absolute: 40 cells/uL (ref 0–200)
Basophils Relative: 0.8 %
Eosinophils Absolute: 80 cells/uL (ref 15–500)
Eosinophils Relative: 1.6 %
HCT: 33.2 % — ABNORMAL LOW (ref 35.0–45.0)
Hemoglobin: 11 g/dL — ABNORMAL LOW (ref 11.7–15.5)
Lymphs Abs: 1905 cells/uL (ref 850–3900)
MCH: 32.6 pg (ref 27.0–33.0)
MCHC: 33.1 g/dL (ref 32.0–36.0)
MCV: 98.5 fL (ref 80.0–100.0)
MPV: 10.5 fL (ref 7.5–12.5)
Monocytes Relative: 6.2 %
Neutro Abs: 2665 cells/uL (ref 1500–7800)
Neutrophils Relative %: 53.3 %
Platelets: 210 10*3/uL (ref 140–400)
RBC: 3.37 10*6/uL — ABNORMAL LOW (ref 3.80–5.10)
RDW: 15.2 % — ABNORMAL HIGH (ref 11.0–15.0)
Total Lymphocyte: 38.1 %
WBC: 5 10*3/uL (ref 3.8–10.8)

## 2020-06-24 LAB — LIPID PANEL
Cholesterol: 152 mg/dL (ref <200)
HDL: 61 mg/dL (ref >=50)
LDL Cholesterol (Calc): 75 mg/dL (calc)
Non-HDL Cholesterol (Calc): 91 mg/dL (calc) (ref <130)
Total CHOL/HDL Ratio: 2.5 (calc) (ref <5.0)
Triglycerides: 78 mg/dL (ref <150)

## 2020-06-24 LAB — URINALYSIS W MICROSCOPIC + REFLEX CULTURE
Bacteria, UA: NONE SEEN /HPF
Bilirubin Urine: NEGATIVE
Glucose, UA: NEGATIVE
Hgb urine dipstick: NEGATIVE
Hyaline Cast: NONE SEEN /LPF
Ketones, ur: NEGATIVE
Leukocyte Esterase: NEGATIVE
Nitrites, Initial: NEGATIVE
Protein, ur: NEGATIVE
RBC / HPF: NONE SEEN /HPF (ref 0–2)
Specific Gravity, Urine: 1.014 (ref 1.001–1.03)
Squamous Epithelial / HPF: NONE SEEN /HPF (ref <=5)
pH: 5.5 (ref 5.0–8.0)

## 2020-06-24 LAB — TSH: TSH: 2.53 mIU/L (ref 0.40–4.50)

## 2020-06-24 LAB — NO CULTURE INDICATED

## 2020-07-08 DIAGNOSIS — L814 Other melanin hyperpigmentation: Secondary | ICD-10-CM | POA: Diagnosis not present

## 2020-07-08 DIAGNOSIS — L905 Scar conditions and fibrosis of skin: Secondary | ICD-10-CM | POA: Diagnosis not present

## 2020-07-08 DIAGNOSIS — L570 Actinic keratosis: Secondary | ICD-10-CM | POA: Diagnosis not present

## 2020-07-08 DIAGNOSIS — L821 Other seborrheic keratosis: Secondary | ICD-10-CM | POA: Diagnosis not present

## 2020-07-08 DIAGNOSIS — D485 Neoplasm of uncertain behavior of skin: Secondary | ICD-10-CM | POA: Diagnosis not present

## 2020-07-08 DIAGNOSIS — D1801 Hemangioma of skin and subcutaneous tissue: Secondary | ICD-10-CM | POA: Diagnosis not present

## 2020-07-13 DIAGNOSIS — M25562 Pain in left knee: Secondary | ICD-10-CM | POA: Diagnosis not present

## 2020-07-13 DIAGNOSIS — M84361D Stress fracture, right tibia, subsequent encounter for fracture with routine healing: Secondary | ICD-10-CM | POA: Diagnosis not present

## 2020-07-13 DIAGNOSIS — M1712 Unilateral primary osteoarthritis, left knee: Secondary | ICD-10-CM | POA: Diagnosis not present

## 2020-07-15 DIAGNOSIS — M6281 Muscle weakness (generalized): Secondary | ICD-10-CM | POA: Diagnosis not present

## 2020-07-15 DIAGNOSIS — M84361D Stress fracture, right tibia, subsequent encounter for fracture with routine healing: Secondary | ICD-10-CM | POA: Diagnosis not present

## 2020-07-20 DIAGNOSIS — M84361D Stress fracture, right tibia, subsequent encounter for fracture with routine healing: Secondary | ICD-10-CM | POA: Diagnosis not present

## 2020-07-20 DIAGNOSIS — M6281 Muscle weakness (generalized): Secondary | ICD-10-CM | POA: Diagnosis not present

## 2020-07-29 DIAGNOSIS — Z961 Presence of intraocular lens: Secondary | ICD-10-CM | POA: Diagnosis not present

## 2020-07-29 DIAGNOSIS — Z79899 Other long term (current) drug therapy: Secondary | ICD-10-CM | POA: Diagnosis not present

## 2020-07-29 DIAGNOSIS — H524 Presbyopia: Secondary | ICD-10-CM | POA: Diagnosis not present

## 2020-08-04 DIAGNOSIS — M84361D Stress fracture, right tibia, subsequent encounter for fracture with routine healing: Secondary | ICD-10-CM | POA: Diagnosis not present

## 2020-08-04 DIAGNOSIS — M6281 Muscle weakness (generalized): Secondary | ICD-10-CM | POA: Diagnosis not present

## 2020-08-13 ENCOUNTER — Other Ambulatory Visit: Payer: Self-pay | Admitting: Internal Medicine

## 2020-08-13 DIAGNOSIS — G250 Essential tremor: Secondary | ICD-10-CM

## 2020-08-13 MED ORDER — DIAZEPAM 5 MG PO TABS: ORAL_TABLET | ORAL | 1 refills | Status: DC

## 2020-08-14 DIAGNOSIS — M25561 Pain in right knee: Secondary | ICD-10-CM | POA: Diagnosis not present

## 2020-08-17 DIAGNOSIS — I700 Atherosclerosis of aorta: Secondary | ICD-10-CM | POA: Insufficient documentation

## 2020-08-17 NOTE — Progress Notes (Signed)
Assessment and Plan:  Dana [name] was seen today for acute visit.  Diagnoses and all orders for this visit:  RLQ abdominal pain Right lower quadrant abdominal tenderness without rebound tenderness Mild intermittent sx, fairly bengin exam today; possible UTI with new frequency/urgency Hx of diverticulosis; encouraged bowel regimen; miralax PRN Hernia considered but not evident on exam today  She is concerned about r/o appendicitis - very mild pain, intermittent; I have fairly low suspicion today, but persistent/progressive, remains unexplained by labs consider CT Alternately may be muscular; continue tylenol PRN, consider heat Follow up pending labs Please go to the ER if you have any severe AB pain, unable to hold down food/water, blood in stool or vomit, chest pain, shortness of breath, or any worsening symptoms.  -     CBC with Differential/Platelet -     COMPLETE METABOLIC PANEL WITH GFR -     Urinalysis w microscopic + reflex cultur  Urinary urgency -     Urinalysis w microscopic + reflex cultur  Further disposition pending results of labs. Discussed med's effects and SE's.   Over 30 minutes of exam, counseling, chart review, and critical decision making was performed.   Future Appointments  Date Time Provider [place]  08/18/2020 11:00 AM Liane Comber, NP [facility] None  10/06/2020 10:00 AM Unk Pinto, MD [facility] None    ------------------------------------------------------------------------------------------------------------------   HPI 79 y.o.female remote former smoker with hx of GERD/gastric ulcer, chronic anemia, RA on methotrexate/plaquenil presents for evaluation of abdominal pain.   She reports RLQ pain that began about 1 week ago, was standing in shower and started suddenly, tenderness in R lower abdomen and has had intermittently since then.   She reports seems to be worse later in the day, typically doesn't wake up with pain, will have gradual  onset of RLQ pain after lunch. She reports tender sensation, 3-4/10 at worst, did have sensation higher in stomach 1 day, denies other radiation. Hurts worse with trying to get up out of chair. She reports intermittent sense of localized swelling on that side.   Reports BMs have been regular, normal for her, no recent constipation though has had in the past, denies nausea, good appetite, food intake doesn't aggravate sx. She reports having sense of urinary urgency only with standing, has had mild leaking as well, 1 episode of incontinence 1 month ago that was unusual. Has noted urinary frequency. Denies dysuria or urine character change. Denies fever/chills. Denies vaginal discharge  She has tried tylenol for pain which does help significantly, will take away pain.   Last colonoscopy in 09/2012 by Dr. Deatra Ina showed sigmoid diverticuloses S/p abdominal hysterectomy, has 1 ovary remaining, unsure which side, cholecystectomy in 2012  Surgical History:  She  has a past surgical history that includes Abdominal hysterectomy; Tubal ligation; Cataract extraction, bilateral; Cholecystectomy (08/31/11); and Colonoscopy. Family History:  Herfamily history includes Diabetes in her mother and sister; Heart attack (age of onset: 32) in her son; Heart disease in her father, maternal aunt, maternal uncle, and mother. Social History:   reports that she quit smoking about 35 years ago. Her smoking use included cigarettes. She has never used smokeless tobacco. She reports that she does not drink alcohol and does not use drugs.   Past Medical History:  Diagnosis Date  . Anemia   . Cataract   . Environmental allergies   . Gastric ulcer   . Heartburn   . Hiatal hernia   . Hyperlipidemia   . Hypertension   .  Hypothyroidism   . Migraines   . Prediabetes   . Rash october 2012   all over abdomen and breasts  . Rheumatoid arthritis(714.0)   . Tremor, essential    right hand     Allergies  Allergen  Reactions  . Codeine Nausea Only  . Mysoline [Primidone] Nausea Only and Other (See Comments)    Over-sedated    Current Outpatient Medications on File Prior to Visit  Medication Sig  . Acetaminophen (TYLENOL PO) Take 500 mg by mouth. Take two tablets at bedtime  . Cholecalciferol (VITAMIN D) 2000 UNITS CAPS Take by mouth. Take 5 capsules by mouth once daily  . Cyanocobalamin (VITAMIN B 12 PO) Place 1 tablet under the tongue daily.  . diazepam (VALIUM) 5 MG tablet Take 1 tablet 3 x /day for Tremor  . folic acid (FOLVITE) 811 MCG tablet Take 1 tablet daily.  . furosemide (LASIX) 40 MG tablet TAKE 1 TABLET 2  TIMES DAILY FOR FLUID  RETENTION AND ANKLE  SWELLING  . gabapentin (NEURONTIN) 300 MG capsule Take 1 capsule 3 x  /day for Neuropathy Pain  or Itching  . hydroxychloroquine (PLAQUENIL) 200 MG tablet TAKE 1 TABLET BY MOUTH  TWICE A DAY  . levothyroxine (SYNTHROID) 100 MCG tablet Take 1 tablet daily on an empty stomach with only water for 30 minutes & no Antacid meds, Calcium or Magnesium for 4 hours & avoid Biotin  . methotrexate (RHEUMATREX) 2.5 MG tablet Take 5 tablets on Tues and 5 tablets on Wed  . montelukast (SINGULAIR) 10 MG tablet Take 1 tablet (10 mg total) by mouth daily.  . ondansetron (ZOFRAN ODT) 8 MG disintegrating tablet 8mg  ODT q4 hours prn nausea  . pravastatin (PRAVACHOL) 40 MG tablet TAKE 1 TABLET BY MOUTH  DAILY  . propranolol (INDERAL) 80 MG tablet TAKE 1 TABLET BY MOUTH 3  TIMES DAILY   No current facility-administered medications on file prior to visit.    ROS: Review of Systems  Constitutional: Negative for chills, diaphoresis, fever, malaise/fatigue and weight loss.  HENT: Negative.   Eyes: Negative.   Respiratory: Negative for cough, sputum production and shortness of breath.   Cardiovascular: Negative for chest pain and leg swelling.  Gastrointestinal: Positive for abdominal pain (RLQ intermittent). Negative for blood in stool, constipation (mild,  intermittent, unchanged), diarrhea, heartburn, melena, nausea and vomiting.  Genitourinary: Positive for frequency and urgency. Negative for dysuria, flank pain and hematuria.  Musculoskeletal: Negative for back pain, falls, joint pain and myalgias.  Skin: Negative for rash.  Neurological: Negative for dizziness and headaches.  Endo/Heme/Allergies: Negative for polydipsia.    Physical Exam:  There were no vitals taken for this visit.  General Appearance: Well nourished, well dressed elderly female, in no apparent distress. Eyes: PERRLA,  conjunctiva no swelling or erythema Sinuses: No Frontal/maxillary tenderness ENT/Mouth:  No erythema, swelling, or exudate on post pharynx.  Tonsils not swollen or erythematous. Hearing normal.  Neck: Supple Respiratory: Respiratory effort normal, BS equal bilaterally without rales, rhonchi, wheezing or stridor.  Cardio: RRR with no MRGs. Brisk peripheral pulses without edema.  Abdomen: Soft, non-distended, + BS.  Mild RLQ/suprapubic tenderness, no guarding, rebound, hernias, masses. Lymphatics: Non tender without lymphadenopathy.  Musculoskeletal: No obvious deformity, slow steady gait.  Skin: Warm, dry without rashes, lesions, ecchymosis.  Neuro: Normal muscle tone,  Psych: Awake and oriented X 3, normal affect, Insight and Judgment appropriate.     Izora Ribas, NP 1:07 PM [facility]

## 2020-08-18 ENCOUNTER — Encounter: Payer: Self-pay | Admitting: Adult Health

## 2020-08-18 ENCOUNTER — Other Ambulatory Visit: Payer: Self-pay

## 2020-08-18 ENCOUNTER — Ambulatory Visit (INDEPENDENT_AMBULATORY_CARE_PROVIDER_SITE_OTHER): Payer: Medicare HMO | Admitting: Adult Health

## 2020-08-18 VITALS — BP 144/64 | HR 64 | Temp 97.0°F | Resp 16 | Wt 137.2 lb

## 2020-08-18 DIAGNOSIS — R1031 Right lower quadrant pain: Secondary | ICD-10-CM

## 2020-08-18 DIAGNOSIS — R3915 Urgency of urination: Secondary | ICD-10-CM

## 2020-08-18 DIAGNOSIS — R10813 Right lower quadrant abdominal tenderness: Secondary | ICD-10-CM | POA: Diagnosis not present

## 2020-08-18 NOTE — Patient Instructions (Signed)
  Get on miralax or other to soften bowels  Ok to take tylenol 1-2 tabs up to three times a day as needed for pain; can try applying heat  Push water intake  If labs suggest an infection will send in an antibiotic  If unexplained but persistent, we will plan to get a CT scan     Abdominal Pain, Adult Pain in the abdomen (abdominal pain) can be caused by many things. Often, abdominal pain is not serious and it gets better with no treatment or by being treated at home. However, sometimes abdominal pain is serious. Your health care provider will ask questions about your medical history and do a physical exam to try to determine the cause of your abdominal pain. Follow these instructions at home:  Medicines  Take over-the-counter and prescription medicines only as told by your health care provider.  Do not take a laxative unless told by your health care provider. General instructions  Watch your condition for any changes.  Drink enough fluid to keep your urine pale yellow.  Keep all follow-up visits as told by your health care provider. This is important. Contact a health care provider if:  Your abdominal pain changes or gets worse.  You are not hungry or you lose weight without trying.  You are constipated or have diarrhea for more than 2-3 days.  You have pain when you urinate or have a bowel movement.  Your abdominal pain wakes you up at night.  Your pain gets worse with meals, after eating, or with certain foods.  You are vomiting and cannot keep anything down.  You have a fever.  You have blood in your urine. Get help right away if:  Your pain does not go away as soon as your health care provider told you to expect.  You cannot stop vomiting.  Your pain is only in areas of the abdomen, such as the right side or the left lower portion of the abdomen. Pain on the right side could be caused by appendicitis.  You have bloody or black stools, or stools that look  like tar.  You have severe pain, cramping, or bloating in your abdomen.  You have signs of dehydration, such as: ? Dark urine, very little urine, or no urine. ? Cracked lips. ? Dry mouth. ? Sunken eyes. ? Sleepiness. ? Weakness.  You have trouble breathing or chest pain. Summary  Often, abdominal pain is not serious and it gets better with no treatment or by being treated at home. However, sometimes abdominal pain is serious.  Watch your condition for any changes.  Take over-the-counter and prescription medicines only as told by your health care provider.  Contact a health care provider if your abdominal pain changes or gets worse.  Get help right away if you have severe pain, cramping, or bloating in your abdomen. This information is not intended to replace advice given to you by your health care provider. Make sure you discuss any questions you have with your health care provider. Document Revised: 03/18/2019 Document Reviewed: 03/18/2019 Elsevier Patient Education  [place].

## 2020-08-19 DIAGNOSIS — M810 Age-related osteoporosis without current pathological fracture: Secondary | ICD-10-CM | POA: Diagnosis not present

## 2020-08-19 DIAGNOSIS — M0579 Rheumatoid arthritis with rheumatoid factor of multiple sites without organ or systems involvement: Secondary | ICD-10-CM | POA: Diagnosis not present

## 2020-08-19 DIAGNOSIS — M8000XA Age-related osteoporosis with current pathological fracture, unspecified site, initial encounter for fracture: Secondary | ICD-10-CM | POA: Diagnosis not present

## 2020-08-19 DIAGNOSIS — Z79899 Other long term (current) drug therapy: Secondary | ICD-10-CM | POA: Diagnosis not present

## 2020-08-19 LAB — URINALYSIS W MICROSCOPIC + REFLEX CULTURE
Bacteria, UA: NONE SEEN /HPF
Bilirubin Urine: NEGATIVE
Glucose, UA: NEGATIVE
Hgb urine dipstick: NEGATIVE
Hyaline Cast: NONE SEEN /LPF
Ketones, ur: NEGATIVE
Leukocyte Esterase: NEGATIVE
Nitrites, Initial: NEGATIVE
Specific Gravity, Urine: 1.023 (ref 1.001–1.03)
pH: 5.5 (ref 5.0–8.0)

## 2020-08-19 LAB — CBC WITH DIFFERENTIAL/PLATELET
Absolute Monocytes: 384 cells/uL (ref 200–950)
Basophils Absolute: 18 cells/uL (ref 0–200)
Basophils Relative: 0.3 %
Eosinophils Absolute: 72 cells/uL (ref 15–500)
Eosinophils Relative: 1.2 %
HCT: 34.2 % — ABNORMAL LOW (ref 35.0–45.0)
Hemoglobin: 11.2 g/dL — ABNORMAL LOW (ref 11.7–15.5)
Lymphs Abs: 1266 cells/uL (ref 850–3900)
MCH: 33.5 pg — ABNORMAL HIGH (ref 27.0–33.0)
MCHC: 32.7 g/dL (ref 32.0–36.0)
MCV: 102.4 fL — ABNORMAL HIGH (ref 80.0–100.0)
MPV: 9.9 fL (ref 7.5–12.5)
Monocytes Relative: 6.4 %
Neutro Abs: 4260 cells/uL (ref 1500–7800)
Neutrophils Relative %: 71 %
Platelets: 268 10*3/uL (ref 140–400)
RBC: 3.34 10*6/uL — ABNORMAL LOW (ref 3.80–5.10)
RDW: 15.6 % — ABNORMAL HIGH (ref 11.0–15.0)
Total Lymphocyte: 21.1 %
WBC: 6 10*3/uL (ref 3.8–10.8)

## 2020-08-19 LAB — COMPLETE METABOLIC PANEL WITH GFR
AG Ratio: 2.1 (calc) (ref 1.0–2.5)
ALT: 9 U/L (ref 6–29)
AST: 19 U/L (ref 10–35)
Albumin: 3.7 g/dL (ref 3.6–5.1)
Alkaline phosphatase (APISO): 74 U/L (ref 37–153)
BUN: 9 mg/dL (ref 7–25)
CO2: 35 mmol/L — ABNORMAL HIGH (ref 20–32)
Calcium: 9 mg/dL (ref 8.6–10.4)
Chloride: 104 mmol/L (ref 98–110)
Creat: 0.7 mg/dL (ref 0.60–0.88)
GFR, Est African American: 94 mL/min/{1.73_m2} (ref >=60)
GFR, Est Non African American: 81 mL/min/{1.73_m2} (ref >=60)
Globulin: 1.8 g/dL (calc) — ABNORMAL LOW (ref 1.9–3.7)
Glucose, Bld: 82 mg/dL (ref 65–99)
Potassium: 3.9 mmol/L (ref 3.5–5.3)
Sodium: 142 mmol/L (ref 135–146)
Total Bilirubin: 0.5 mg/dL (ref 0.2–1.2)
Total Protein: 5.5 g/dL — ABNORMAL LOW (ref 6.1–8.1)

## 2020-08-19 LAB — NO CULTURE INDICATED

## 2020-08-25 DIAGNOSIS — R238 Other skin changes: Secondary | ICD-10-CM | POA: Diagnosis not present

## 2020-08-25 DIAGNOSIS — L820 Inflamed seborrheic keratosis: Secondary | ICD-10-CM | POA: Diagnosis not present

## 2020-08-25 DIAGNOSIS — D225 Melanocytic nevi of trunk: Secondary | ICD-10-CM | POA: Diagnosis not present

## 2020-08-25 DIAGNOSIS — L905 Scar conditions and fibrosis of skin: Secondary | ICD-10-CM | POA: Diagnosis not present

## 2020-08-25 DIAGNOSIS — L570 Actinic keratosis: Secondary | ICD-10-CM | POA: Diagnosis not present

## 2020-08-25 DIAGNOSIS — L989 Disorder of the skin and subcutaneous tissue, unspecified: Secondary | ICD-10-CM | POA: Diagnosis not present

## 2020-08-25 DIAGNOSIS — C44729 Squamous cell carcinoma of skin of left lower limb, including hip: Secondary | ICD-10-CM | POA: Diagnosis not present

## 2020-08-25 DIAGNOSIS — D485 Neoplasm of uncertain behavior of skin: Secondary | ICD-10-CM | POA: Diagnosis not present

## 2020-08-25 DIAGNOSIS — L718 Other rosacea: Secondary | ICD-10-CM | POA: Diagnosis not present

## 2020-08-26 ENCOUNTER — Other Ambulatory Visit: Payer: Self-pay | Admitting: Adult Health

## 2020-08-26 DIAGNOSIS — R10813 Right lower quadrant abdominal tenderness: Secondary | ICD-10-CM

## 2020-08-31 ENCOUNTER — Other Ambulatory Visit: Payer: Medicare Other

## 2020-09-03 ENCOUNTER — Encounter: Payer: Self-pay | Admitting: Adult Health

## 2020-09-03 ENCOUNTER — Ambulatory Visit
Admission: RE | Admit: 2020-09-03 | Discharge: 2020-09-03 | Disposition: A | Discharge status: Home / Self Care | Payer: Medicare HMO | Source: Ambulatory Visit | Attending: Adult Health | Admitting: Adult Health

## 2020-09-03 ENCOUNTER — Other Ambulatory Visit: Payer: Self-pay

## 2020-09-03 DIAGNOSIS — Z90710 Acquired absence of both cervix and uterus: Secondary | ICD-10-CM | POA: Diagnosis not present

## 2020-09-03 DIAGNOSIS — I700 Atherosclerosis of aorta: Secondary | ICD-10-CM | POA: Diagnosis not present

## 2020-09-03 DIAGNOSIS — K5790 Diverticulosis of intestine, part unspecified, without perforation or abscess without bleeding: Secondary | ICD-10-CM | POA: Insufficient documentation

## 2020-09-03 DIAGNOSIS — R10813 Right lower quadrant abdominal tenderness: Secondary | ICD-10-CM

## 2020-09-03 DIAGNOSIS — K5730 Diverticulosis of large intestine without perforation or abscess without bleeding: Secondary | ICD-10-CM | POA: Diagnosis not present

## 2020-09-03 DIAGNOSIS — Z9049 Acquired absence of other specified parts of digestive tract: Secondary | ICD-10-CM | POA: Diagnosis not present

## 2020-09-03 HISTORY — DX: Diverticulosis of intestine, part unspecified, without perforation or abscess without bleeding: K57.90

## 2020-09-03 MED ORDER — IOPAMIDOL (ISOVUE-300) INJECTION 61%
100.0000 mL | Freq: Once | INTRAVENOUS | Status: AC | PRN
  Administered 2020-09-03: 100 mL via INTRAVENOUS

## 2020-10-06 ENCOUNTER — Encounter: Payer: Self-pay | Admitting: Internal Medicine

## 2020-10-06 ENCOUNTER — Ambulatory Visit (INDEPENDENT_AMBULATORY_CARE_PROVIDER_SITE_OTHER): Payer: Medicare HMO | Admitting: Internal Medicine

## 2020-10-06 ENCOUNTER — Other Ambulatory Visit: Payer: Self-pay

## 2020-10-06 VITALS — BP 126/62 | HR 60 | Temp 97.8°F | Resp 16 | Ht 63.0 in | Wt 135.8 lb

## 2020-10-06 DIAGNOSIS — E782 Mixed hyperlipidemia: Secondary | ICD-10-CM | POA: Diagnosis not present

## 2020-10-06 DIAGNOSIS — Z136 Encounter for screening for cardiovascular disorders: Secondary | ICD-10-CM

## 2020-10-06 DIAGNOSIS — Z0001 Encounter for general adult medical examination with abnormal findings: Secondary | ICD-10-CM

## 2020-10-06 DIAGNOSIS — Z0000 Encounter for general adult medical examination without abnormal findings: Secondary | ICD-10-CM | POA: Diagnosis not present

## 2020-10-06 DIAGNOSIS — Z79899 Other long term (current) drug therapy: Secondary | ICD-10-CM

## 2020-10-06 DIAGNOSIS — I10 Essential (primary) hypertension: Secondary | ICD-10-CM | POA: Diagnosis not present

## 2020-10-06 DIAGNOSIS — E039 Hypothyroidism, unspecified: Secondary | ICD-10-CM

## 2020-10-06 DIAGNOSIS — N1832 Chronic kidney disease, stage 3b: Secondary | ICD-10-CM

## 2020-10-06 DIAGNOSIS — Z8249 Family history of ischemic heart disease and other diseases of the circulatory system: Secondary | ICD-10-CM | POA: Diagnosis not present

## 2020-10-06 DIAGNOSIS — R7309 Other abnormal glucose: Secondary | ICD-10-CM | POA: Diagnosis not present

## 2020-10-06 DIAGNOSIS — M069 Rheumatoid arthritis, unspecified: Secondary | ICD-10-CM

## 2020-10-06 DIAGNOSIS — I700 Atherosclerosis of aorta: Secondary | ICD-10-CM

## 2020-10-06 DIAGNOSIS — K580 Irritable bowel syndrome with diarrhea: Secondary | ICD-10-CM

## 2020-10-06 DIAGNOSIS — E559 Vitamin D deficiency, unspecified: Secondary | ICD-10-CM

## 2020-10-06 DIAGNOSIS — Z23 Encounter for immunization: Secondary | ICD-10-CM | POA: Diagnosis not present

## 2020-10-06 DIAGNOSIS — Z87891 Personal history of nicotine dependence: Secondary | ICD-10-CM

## 2020-10-06 DIAGNOSIS — G250 Essential tremor: Secondary | ICD-10-CM

## 2020-10-06 NOTE — Patient Instructions (Signed)

## 2020-10-06 NOTE — Progress Notes (Signed)
Annual Screening/Preventative Visit & Comprehensive Evaluation &  Examination      This very nice 79 y.o.  WWF presents for a Screening /Preventative Visit & comprehensive evaluation and management of multiple medical co-morbidities.  Patient has been followed for HTN, HLD, Prediabetes, Hypothyroid, Rheumatoid Arthritis and Vitamin D Deficiency. Patient has Aortic Atherosclerosis by CXR in 2020 and CT scan in 2021.  She also has Hereditary Tremor stable on Propranolol & Diazepam.  Patient 's RA (2001) is felt in remission on MTX & Plaquenil followed by Dr Baxter Flattery in [place]       HTN predates circa 1998. Patient's BP has been controlled at home and patient denies any cardiac symptoms as chest pain, palpitations, shortness of breath, dizziness or ankle swelling. Today's BP is at goal -  126/62.       Patient's hyperlipidemia is controlled with diet and medications. Patient denies myalgias or other medication SE's. Last lipids were at goal:  Lab Results  Component Value Date   CHOL 152 06/23/2020   HDL 61 06/23/2020   LDLCALC 75 06/23/2020   TRIG 78 06/23/2020   CHOLHDL 2.5 06/23/2020        Patient has hx/o prediabetes predating since 2012 and patient denies reactive hypoglycemic symptoms, visual blurring, diabetic polys or paresthesias. Last A1c was Normal & at goal:  Lab Results  Component Value Date   HGBA1C 5.2 03/16/2020       Patient was initiated on replacement therapy when dx'd  Hypothyroid in 1994.      Finally, patient has history of Vitamin D Deficiency ("17" /2009) and last Vitamin D was not at goal: Lab Results  Component Value Date   VD25OH 47 03/16/2020    Current Outpatient Medications on File Prior to Visit  Medication Sig  . Acetaminophen (TYLENOL PO) 500 mg  Take two tablets at bedtime  . VITAMIN D 2000 UNITS  Take 5 capsules  once daily  . VITAMIN B 12  Place 1 tablet under the tongue daily.  . diazepam (VALIUM) 5 MG tablet Take 1 tablet 3 x /day  for Tremor  . folic acid (FOLVITE) 694 MCG tablet Take 1 tablet daily.  . furosemide (LASIX) 40 MG tablet TAKE 1 TABLET 2  TIMES DAILY   . gabapentin 300 MG capsule Take 1 capsule 3 x  /day for Neuropathy Pain    . hydroxychloroquine  200 MG tablet TAKE 1 TABLET BY MOUTH  TWICE A DAY  . levothyroxine  100 MCG tablet Take 1 tablet daily   . methotrexate 2.5 MG tablet Take 5 tablets on Tues and 5 tablets on Wed  . montelukast  10 MG tablet Take 1 tablet (10 mg total) by mouth daily.  . ondansetron  ODT 8 MG  8mg  ODT q4 hours prn nausea  . pravastatin 40 MG tablet TAKE 1 TABLET BY MOUTH  DAILY  . propranolol  80 MG tablet TAKE 1 TABLET  3  TIMES DAILY     Allergies  Allergen Reactions  . Codeine Nausea Only  . Mysoline [Primidone] Nausea Only and Other (See Comments)    Over-sedated    Past Medical History:  Diagnosis Date  . Anemia   . Cataract   . Diverticulosis 09/03/2020  . Environmental allergies   . Gastric ulcer   . Heartburn   . Hiatal hernia   . Hyperlipidemia   . Hypertension   . Hypothyroidism   . Migraines   . Prediabetes   . Rash october  2012   all over abdomen and breasts  . Rheumatoid arthritis(714.0)   . Tremor, essential    right hand   Health Maintenance  Topic Date Due  . COVID-19 Vaccine (1) Never done  . INFLUENZA VACCINE  06/21/2020  . TETANUS/TDAP  11/10/2025  . DEXA SCAN  Completed  . PNA vac Low Risk Adult  Completed   Immunization History  Administered Date(s) Administered  . DT (Pediatric) 02/05/2015  . Influenza Split 09/12/2013  . Influenza, High Dose Seasonal PF 10/28/2014, 08/18/2016, 11/22/2018, 12/09/2019  . Pneumococcal Conjugate-13 05/12/2015  . Pneumococcal-Unspecified 11/21/2006  . Td 11/22/2003  . Varicella 06/23/2011  . Zoster 06/23/2011    Last Colon - 11/12/2-013 - Dr Deatra Ina - deferred  F/U due to age.  Last MGM - 09/09/2019 - overdue  Past Surgical History:  Procedure Laterality Date  . ABDOMINAL HYSTERECTOMY     . CATARACT EXTRACTION, BILATERAL    . CHOLECYSTECTOMY  08/31/11   lap chole   . COLONOSCOPY    . TUBAL LIGATION     Family History  Problem Relation Age of Onset  . Heart disease Mother   . Diabetes Mother   . Heart disease Father   . Heart disease Maternal Aunt        x3   . Heart disease Maternal Uncle        x 4  . Diabetes Sister        borderline  . Heart attack Son 58       Sudden cardiac death  . Colon cancer Neg Hx    Social History   Tobacco Use  . Smoking status: Former Smoker    Types: Cigarettes    Quit date: 09/18/1984    Years since quitting: 36.0  . Smokeless tobacco: Never Used  . Tobacco comment: stopped around 25 years ago  Substance Use Topics  . Alcohol use: No  . Drug use: No    ROS Constitutional: Denies fever, chills, weight loss/gain, headaches, insomnia,  night sweats, and change in appetite. Does c/o fatigue. Eyes: Denies redness, blurred vision, diplopia, discharge, itchy, watery eyes.  ENT: Denies discharge, congestion, post nasal drip, epistaxis, sore throat, earache, hearing loss, dental pain, Tinnitus, Vertigo, Sinus pain, snoring.  Cardio: Denies chest pain, palpitations, irregular heartbeat, syncope, dyspnea, diaphoresis, orthopnea, PND, claudication, edema Respiratory: denies cough, dyspnea, DOE, pleurisy, hoarseness, laryngitis, wheezing.  Gastrointestinal: Denies dysphagia, heartburn, reflux, water brash, pain, cramps, nausea, vomiting, bloating, diarrhea, constipation, hematemesis, melena, hematochezia, jaundice, hemorrhoids Genitourinary: Denies dysuria, frequency, urgency, nocturia, hesitancy, discharge, hematuria, flank pain Breast: Breast lumps, nipple discharge, bleeding.  Musculoskeletal: Denies arthralgia, myalgia, stiffness, Jt. Swelling, pain, limp, and strain/sprain. Denies falls. Skin: Denies puritis, rash, hives, warts, acne, eczema, changing in skin lesion Neuro: No weakness, tremor, incoordination, spasms, paresthesia,  pain Psychiatric: Denies confusion, memory loss, sensory loss. Denies Depression. Endocrine: Denies change in weight, skin, hair change, nocturia, and paresthesia, diabetic polys, visual blurring, hyper / hypo glycemic episodes.  Heme/Lymph: No excessive bleeding, bruising, enlarged lymph nodes.  Physical Exam  BP 126/62   Pulse 60   Temp 97.8 F (36.6 C)   Resp 16   Ht 5\' 3"  (1.6 m)   Wt 135 lb 12.8 oz (61.6 kg)   SpO2 99%   BMI 24.06 kg/m   General Appearance: Well nourished, well groomed and in no apparent distress.  Eyes: PERRLA, EOMs, conjunctiva no swelling or erythema, normal fundi and vessels. Sinuses: No frontal/maxillary tenderness ENT/Mouth: EACs patent / TMs  nl. Nares clear without erythema, swelling, mucoid exudates. Oral hygiene is good. No erythema, swelling, or exudate. Tongue normal, non-obstructing. Tonsils not swollen or erythematous. Hearing normal.  Neck: Supple, thyroid not palpable. No bruits, nodes or JVD. Respiratory: Respiratory effort normal.  BS equal and clear bilateral without rales, rhonci, wheezing or stridor. Cardio: Heart sounds are normal with regular rate and rhythm and no murmurs, rubs or gallops. Peripheral pulses are normal and equal bilaterally without edema. No aortic or femoral bruits. Chest: symmetric with normal excursions and percussion. Breasts: Symmetric, without lumps, nipple discharge, retractions, or fibrocystic changes.  Abdomen: Flat, soft with bowel sounds active. Nontender, no guarding, rebound, hernias, masses, or organomegaly.  Lymphatics: Non tender without lymphadenopathy.  Genitourinary:  Musculoskeletal: Full ROM all peripheral extremities, joint stability, 5/5 strength, and normal gait. Skin: Warm and dry without rashes, lesions, cyanosis, clubbing or  ecchymosis.  Neuro: Cranial nerves intact, reflexes equal bilaterally. Normal muscle tone, no cerebellar symptoms. Sensation intact.  Pysch: Alert and oriented X 3, normal  affect, Insight and Judgment appropriate.   Assessment and Plan  1. Annual Preventative Screening Examination   2. Essential hypertension  - EKG 12-Lead - Urinalysis, Routine w reflex microscopic - Microalbumin / creatinine urine ratio - CBC with Differential/Platelet - COMPLETE METABOLIC PANEL WITH GFR - Magnesium - TSH  3. Hyperlipidemia, mixed  - EKG 12-Lead - Lipid panel - TSH  4. Abnormal glucose  - EKG 12-Lead - Hemoglobin A1c - Insulin, random  5. Vitamin D deficiency  - VITAMIN D 25 Hydroxy   6. Hypothyroidism  - TSH  7. Hereditary essential tremor   8. Rheumatoid arthritis involving multiple sites ([place])  - CBC with Differential/Platelet - COMPLETE METABOLIC PANEL WITH GFR  9. Stage 3b chronic kidney disease (HCC)  - COMPLETE METABOLIC PANEL WITH GFR  10. Screening for ischemic heart disease  - EKG 12-Lead  11. FHx: heart disease  - EKG 12-Lead  12. Former smoker  - EKG 12-Lead  13. Medication management  - Urinalysis, Routine w reflex microscopic - Microalbumin / creatinine urine ratio - CBC with Differential/Platelet - COMPLETE METABOLIC PANEL WITH GFR - Magnesium - Lipid panel - TSH - Hemoglobin A1c - Insulin, random - VITAMIN D 25 Hydroxy         Patient was counseled in prudent diet to achieve/maintain BMI less than 25 for weight control, BP monitoring, regular exercise and medications. Discussed med's effects and SE's. Screening labs and tests as requested with regular follow-up as recommended. Over 40 minutes of exam, counseling, chart review and high complex critical decision making was performed.   Kirtland Bouchard, MD

## 2020-10-07 LAB — CBC WITH DIFFERENTIAL/PLATELET
Absolute Monocytes: 676 cells/uL (ref 200–950)
Basophils Absolute: 39 cells/uL (ref 0–200)
Basophils Relative: 0.8 %
Eosinophils Absolute: 172 cells/uL (ref 15–500)
Eosinophils Relative: 3.5 %
HCT: 31.7 % — ABNORMAL LOW (ref 35.0–45.0)
Hemoglobin: 10.7 g/dL — ABNORMAL LOW (ref 11.7–15.5)
Lymphs Abs: 1230 cells/uL (ref 850–3900)
MCH: 34.4 pg — ABNORMAL HIGH (ref 27.0–33.0)
MCHC: 33.8 g/dL (ref 32.0–36.0)
MCV: 101.9 fL — ABNORMAL HIGH (ref 80.0–100.0)
MPV: 9.7 fL (ref 7.5–12.5)
Monocytes Relative: 13.8 %
Neutro Abs: 2783 cells/uL (ref 1500–7800)
Neutrophils Relative %: 56.8 %
Platelets: 251 10*3/uL (ref 140–400)
RBC: 3.11 10*6/uL — ABNORMAL LOW (ref 3.80–5.10)
RDW: 14.2 % (ref 11.0–15.0)
Total Lymphocyte: 25.1 %
WBC: 4.9 10*3/uL (ref 3.8–10.8)

## 2020-10-07 LAB — URINALYSIS, ROUTINE W REFLEX MICROSCOPIC
Bacteria, UA: NONE SEEN /HPF
Bilirubin Urine: NEGATIVE
Glucose, UA: NEGATIVE
Hgb urine dipstick: NEGATIVE
Hyaline Cast: NONE SEEN /LPF
Ketones, ur: NEGATIVE
Nitrite: NEGATIVE
RBC / HPF: NONE SEEN /HPF (ref 0–2)
Specific Gravity, Urine: 1.022 (ref 1.001–1.03)
pH: 5.5 (ref 5.0–8.0)

## 2020-10-07 LAB — LIPID PANEL
Cholesterol: 148 mg/dL (ref <200)
HDL: 61 mg/dL (ref >=50)
LDL Cholesterol (Calc): 69 mg/dL (calc)
Non-HDL Cholesterol (Calc): 87 mg/dL (calc) (ref <130)
Total CHOL/HDL Ratio: 2.4 (calc) (ref <5.0)
Triglycerides: 95 mg/dL (ref <150)

## 2020-10-07 LAB — MICROALBUMIN / CREATININE URINE RATIO
Creatinine, Urine: 141 mg/dL (ref 20–275)
Microalb Creat Ratio: 11 mcg/mg creat (ref <30)
Microalb, Ur: 1.6 mg/dL

## 2020-10-07 LAB — INSULIN, RANDOM: Insulin: 2.4 u[IU]/mL

## 2020-10-07 LAB — COMPLETE METABOLIC PANEL WITH GFR
AG Ratio: 2.1 (calc) (ref 1.0–2.5)
ALT: 10 U/L (ref 6–29)
AST: 19 U/L (ref 10–35)
Albumin: 3.9 g/dL (ref 3.6–5.1)
Alkaline phosphatase (APISO): 80 U/L (ref 37–153)
BUN: 16 mg/dL (ref 7–25)
CO2: 33 mmol/L — ABNORMAL HIGH (ref 20–32)
Calcium: 9.3 mg/dL (ref 8.6–10.4)
Chloride: 104 mmol/L (ref 98–110)
Creat: 0.79 mg/dL (ref 0.60–0.88)
GFR, Est African American: 81 mL/min/{1.73_m2} (ref >=60)
GFR, Est Non African American: 70 mL/min/{1.73_m2} (ref >=60)
Globulin: 1.9 g/dL (calc) (ref 1.9–3.7)
Glucose, Bld: 67 mg/dL (ref 65–99)
Potassium: 3.3 mmol/L — ABNORMAL LOW (ref 3.5–5.3)
Sodium: 144 mmol/L (ref 135–146)
Total Bilirubin: 0.6 mg/dL (ref 0.2–1.2)
Total Protein: 5.8 g/dL — ABNORMAL LOW (ref 6.1–8.1)

## 2020-10-07 LAB — VITAMIN D 25 HYDROXY (VIT D DEFICIENCY, FRACTURES): Vit D, 25-Hydroxy: 50 ng/mL (ref 30–100)

## 2020-10-07 LAB — TSH: TSH: 4.85 mIU/L — ABNORMAL HIGH (ref 0.40–4.50)

## 2020-10-07 LAB — HEMOGLOBIN A1C
Hgb A1c MFr Bld: 5.2 % of total Hgb (ref <5.7)
Mean Plasma Glucose: 103 (calc)
eAG (mmol/L): 5.7 (calc)

## 2020-10-07 LAB — MAGNESIUM: Magnesium: 2.2 mg/dL (ref 1.5–2.5)

## 2020-10-07 MED ORDER — DICYCLOMINE HCL 20 MG PO TABS: ORAL_TABLET | ORAL | 0 refills | Status: DC

## 2020-10-07 NOTE — Progress Notes (Signed)
========================================================== ==========================================================  -    Still mild chronic  anemia - Stable  ==========================================================  -  Potassium is low - Recommend use "No Salt " and take about 1/2 teaspoon 2 x/day   - can put on food or in water or juice   - or can send in a rx for potassium pills ==========================================================  - Total Chol = 148 -  Excellent   - Very low risk for Heart Attack  / Stroke =============================================================  - Thyroid appears a little low,   so be sure  to take on an empty stomach   with only water for 30 minutes &   no Antacid meds, Calcium or Magnesium for 4 hours &   avoid Biotin ==========================================================  -  A1c =- Normal - great - No Diabetes ==========================================================  -  Vitamin D = 50 - Low   - Vitamin D goal is between 70-100.   - Please make sure that you are taking your Vitamin D 10,000 units /day   - It is very important as a natural anti-inflammatory and helping the  immune system protect against viral infections, like the Covid-19    helping hair, skin, and nails, as well as reducing stroke and  heart attack risk.   - It helps your bones and helps with mood.  - It also decreases numerous cancer risks so please  take it as directed.   - Low Vit D is associated with a 200-300% higher risk for  CANCER   and 200-300% higher risk for HEART   ATTACK  &  STROKE.    - It is also associated with higher death rate at younger ages,   autoimmune diseases like Rheumatoid arthritis, Lupus,  Multiple Sclerosis.     - Also many other serious conditions, like depression, Alzheimer's  Dementia, infertility, muscle aches, fatigue, fibromyalgia   - just to name a  few. ==========================================================

## 2020-10-07 NOTE — Addendum Note (Signed)
Addended by: Unk Pinto on: 10/07/2020 09:51 PM   Modules accepted: Orders

## 2020-10-24 DIAGNOSIS — M25562 Pain in left knee: Secondary | ICD-10-CM | POA: Diagnosis not present

## 2020-10-24 DIAGNOSIS — M25561 Pain in right knee: Secondary | ICD-10-CM | POA: Diagnosis not present

## 2020-10-30 ENCOUNTER — Other Ambulatory Visit: Payer: Self-pay | Admitting: Internal Medicine

## 2020-10-30 DIAGNOSIS — E039 Hypothyroidism, unspecified: Secondary | ICD-10-CM

## 2020-11-01 ENCOUNTER — Other Ambulatory Visit: Payer: Self-pay | Admitting: Internal Medicine

## 2020-11-01 DIAGNOSIS — M85852 Other specified disorders of bone density and structure, left thigh: Secondary | ICD-10-CM

## 2020-11-01 DIAGNOSIS — J3089 Other allergic rhinitis: Secondary | ICD-10-CM

## 2020-11-01 MED ORDER — MONTELUKAST SODIUM 10 MG PO TABS: ORAL_TABLET | ORAL | 0 refills | Status: DC

## 2020-12-04 DIAGNOSIS — M0579 Rheumatoid arthritis with rheumatoid factor of multiple sites without organ or systems involvement: Secondary | ICD-10-CM | POA: Diagnosis not present

## 2020-12-09 ENCOUNTER — Emergency Department (HOSPITAL_BASED_OUTPATIENT_CLINIC_OR_DEPARTMENT_OTHER): Payer: Medicare HMO

## 2020-12-09 ENCOUNTER — Encounter (HOSPITAL_BASED_OUTPATIENT_CLINIC_OR_DEPARTMENT_OTHER): Payer: Self-pay | Admitting: Emergency Medicine

## 2020-12-09 ENCOUNTER — Inpatient Hospital Stay (HOSPITAL_BASED_OUTPATIENT_CLINIC_OR_DEPARTMENT_OTHER)
Admission: EM | Admit: 2020-12-09 | Discharge: 2020-12-13 | DRG: 564 | Disposition: A | Discharge status: Home Health | Payer: Medicare HMO | Attending: Internal Medicine | Admitting: Internal Medicine

## 2020-12-09 ENCOUNTER — Other Ambulatory Visit: Payer: Self-pay

## 2020-12-09 DIAGNOSIS — W19XXXA Unspecified fall, initial encounter: Secondary | ICD-10-CM

## 2020-12-09 DIAGNOSIS — Z90710 Acquired absence of both cervix and uterus: Secondary | ICD-10-CM | POA: Diagnosis not present

## 2020-12-09 DIAGNOSIS — R69 Illness, unspecified: Secondary | ICD-10-CM | POA: Diagnosis not present

## 2020-12-09 DIAGNOSIS — M25561 Pain in right knee: Secondary | ICD-10-CM | POA: Diagnosis not present

## 2020-12-09 DIAGNOSIS — R600 Localized edema: Secondary | ICD-10-CM | POA: Diagnosis not present

## 2020-12-09 DIAGNOSIS — E559 Vitamin D deficiency, unspecified: Secondary | ICD-10-CM | POA: Diagnosis present

## 2020-12-09 DIAGNOSIS — Z8249 Family history of ischemic heart disease and other diseases of the circulatory system: Secondary | ICD-10-CM | POA: Diagnosis not present

## 2020-12-09 DIAGNOSIS — T796XXA Traumatic ischemia of muscle, initial encounter: Principal | ICD-10-CM | POA: Diagnosis present

## 2020-12-09 DIAGNOSIS — I10 Essential (primary) hypertension: Secondary | ICD-10-CM | POA: Diagnosis present

## 2020-12-09 DIAGNOSIS — G319 Degenerative disease of nervous system, unspecified: Secondary | ICD-10-CM | POA: Diagnosis not present

## 2020-12-09 DIAGNOSIS — M25461 Effusion, right knee: Secondary | ICD-10-CM | POA: Diagnosis present

## 2020-12-09 DIAGNOSIS — R079 Chest pain, unspecified: Secondary | ICD-10-CM | POA: Diagnosis not present

## 2020-12-09 DIAGNOSIS — U071 COVID-19: Secondary | ICD-10-CM | POA: Diagnosis not present

## 2020-12-09 DIAGNOSIS — R531 Weakness: Secondary | ICD-10-CM

## 2020-12-09 DIAGNOSIS — Z8711 Personal history of peptic ulcer disease: Secondary | ICD-10-CM | POA: Diagnosis not present

## 2020-12-09 DIAGNOSIS — Z885 Allergy status to narcotic agent status: Secondary | ICD-10-CM | POA: Diagnosis not present

## 2020-12-09 DIAGNOSIS — M1711 Unilateral primary osteoarthritis, right knee: Secondary | ICD-10-CM | POA: Diagnosis not present

## 2020-12-09 DIAGNOSIS — K5790 Diverticulosis of intestine, part unspecified, without perforation or abscess without bleeding: Secondary | ICD-10-CM | POA: Diagnosis not present

## 2020-12-09 DIAGNOSIS — M069 Rheumatoid arthritis, unspecified: Secondary | ICD-10-CM | POA: Diagnosis not present

## 2020-12-09 DIAGNOSIS — E039 Hypothyroidism, unspecified: Secondary | ICD-10-CM | POA: Diagnosis present

## 2020-12-09 DIAGNOSIS — Z79899 Other long term (current) drug therapy: Secondary | ICD-10-CM | POA: Diagnosis not present

## 2020-12-09 DIAGNOSIS — R296 Repeated falls: Secondary | ICD-10-CM | POA: Diagnosis present

## 2020-12-09 DIAGNOSIS — E785 Hyperlipidemia, unspecified: Secondary | ICD-10-CM | POA: Diagnosis not present

## 2020-12-09 DIAGNOSIS — Z888 Allergy status to other drugs, medicaments and biological substances status: Secondary | ICD-10-CM | POA: Diagnosis not present

## 2020-12-09 DIAGNOSIS — G250 Essential tremor: Secondary | ICD-10-CM | POA: Diagnosis present

## 2020-12-09 DIAGNOSIS — G629 Polyneuropathy, unspecified: Secondary | ICD-10-CM | POA: Diagnosis present

## 2020-12-09 DIAGNOSIS — S0990XA Unspecified injury of head, initial encounter: Secondary | ICD-10-CM | POA: Diagnosis not present

## 2020-12-09 DIAGNOSIS — E876 Hypokalemia: Secondary | ICD-10-CM | POA: Diagnosis not present

## 2020-12-09 DIAGNOSIS — Z9181 History of falling: Secondary | ICD-10-CM

## 2020-12-09 DIAGNOSIS — Z87891 Personal history of nicotine dependence: Secondary | ICD-10-CM

## 2020-12-09 DIAGNOSIS — M25551 Pain in right hip: Secondary | ICD-10-CM | POA: Diagnosis not present

## 2020-12-09 DIAGNOSIS — R5381 Other malaise: Secondary | ICD-10-CM | POA: Diagnosis not present

## 2020-12-09 DIAGNOSIS — Y92009 Unspecified place in unspecified non-institutional (private) residence as the place of occurrence of the external cause: Secondary | ICD-10-CM | POA: Diagnosis not present

## 2020-12-09 DIAGNOSIS — M4312 Spondylolisthesis, cervical region: Secondary | ICD-10-CM | POA: Diagnosis not present

## 2020-12-09 DIAGNOSIS — T502X5A Adverse effect of carbonic-anhydrase inhibitors, benzothiadiazides and other diuretics, initial encounter: Secondary | ICD-10-CM | POA: Diagnosis present

## 2020-12-09 DIAGNOSIS — M6282 Rhabdomyolysis: Secondary | ICD-10-CM | POA: Diagnosis present

## 2020-12-09 DIAGNOSIS — E860 Dehydration: Secondary | ICD-10-CM | POA: Diagnosis present

## 2020-12-09 DIAGNOSIS — J3489 Other specified disorders of nose and nasal sinuses: Secondary | ICD-10-CM | POA: Diagnosis not present

## 2020-12-09 DIAGNOSIS — J341 Cyst and mucocele of nose and nasal sinus: Secondary | ICD-10-CM | POA: Diagnosis not present

## 2020-12-09 DIAGNOSIS — M25061 Hemarthrosis, right knee: Secondary | ICD-10-CM | POA: Diagnosis present

## 2020-12-09 DIAGNOSIS — M84361A Stress fracture, right tibia, initial encounter for fracture: Secondary | ICD-10-CM | POA: Diagnosis not present

## 2020-12-09 DIAGNOSIS — I700 Atherosclerosis of aorta: Secondary | ICD-10-CM | POA: Diagnosis not present

## 2020-12-09 DIAGNOSIS — S199XXA Unspecified injury of neck, initial encounter: Secondary | ICD-10-CM | POA: Diagnosis not present

## 2020-12-09 HISTORY — DX: Rhabdomyolysis: M62.82

## 2020-12-09 LAB — COMPREHENSIVE METABOLIC PANEL
ALT: 22 U/L (ref 0–44)
AST: 55 U/L — ABNORMAL HIGH (ref 15–41)
Albumin: 3.2 g/dL — ABNORMAL LOW (ref 3.5–5.0)
Alkaline Phosphatase: 70 U/L (ref 38–126)
Anion gap: 11 (ref 5–15)
BUN: 16 mg/dL (ref 8–23)
CO2: 29 mmol/L (ref 22–32)
Calcium: 8.6 mg/dL — ABNORMAL LOW (ref 8.9–10.3)
Chloride: 99 mmol/L (ref 98–111)
Creatinine, Ser: 0.71 mg/dL (ref 0.44–1.00)
GFR, Estimated: >60 mL/min (ref >60)
Glucose, Bld: 89 mg/dL (ref 70–99)
Potassium: 2.9 mmol/L — ABNORMAL LOW (ref 3.5–5.1)
Sodium: 139 mmol/L (ref 135–145)
Total Bilirubin: 0.9 mg/dL (ref 0.3–1.2)
Total Protein: 5.6 g/dL — ABNORMAL LOW (ref 6.5–8.1)

## 2020-12-09 LAB — CBC WITH DIFFERENTIAL/PLATELET
Abs Immature Granulocytes: 0.04 10*3/uL (ref 0.00–0.07)
Basophils Absolute: 0 10*3/uL (ref 0.0–0.1)
Basophils Relative: 1 %
Eosinophils Absolute: 0.1 10*3/uL (ref 0.0–0.5)
Eosinophils Relative: 2 %
HCT: 33.9 % — ABNORMAL LOW (ref 36.0–46.0)
Hemoglobin: 11 g/dL — ABNORMAL LOW (ref 12.0–15.0)
Immature Granulocytes: 1 %
Lymphocytes Relative: 24 %
Lymphs Abs: 1.4 10*3/uL (ref 0.7–4.0)
MCH: 33.8 pg (ref 26.0–34.0)
MCHC: 32.4 g/dL (ref 30.0–36.0)
MCV: 104.3 fL — ABNORMAL HIGH (ref 80.0–100.0)
Monocytes Absolute: 0.4 10*3/uL (ref 0.1–1.0)
Monocytes Relative: 7 %
Neutro Abs: 3.8 10*3/uL (ref 1.7–7.7)
Neutrophils Relative %: 65 %
Platelets: 192 10*3/uL (ref 150–400)
RBC: 3.25 MIL/uL — ABNORMAL LOW (ref 3.87–5.11)
RDW: 15.1 % (ref 11.5–15.5)
WBC: 5.7 10*3/uL (ref 4.0–10.5)
nRBC: 0 % (ref 0.0–0.2)

## 2020-12-09 LAB — RESP PANEL BY RT-PCR (FLU A&B, COVID) ARPGX2
Influenza A by PCR: NEGATIVE
Influenza B by PCR: NEGATIVE
SARS Coronavirus 2 by RT PCR: POSITIVE — AB

## 2020-12-09 LAB — CK: Total CK: 1716 U/L — ABNORMAL HIGH (ref 38–234)

## 2020-12-09 LAB — LIPASE, BLOOD: Lipase: 26 U/L (ref 11–51)

## 2020-12-09 MED ORDER — SODIUM CHLORIDE 0.9 % IV SOLN
100.0000 mg | Freq: Once | INTRAVENOUS | Status: AC
  Administered 2020-12-09: 100 mg via INTRAVENOUS

## 2020-12-09 MED ORDER — SODIUM CHLORIDE 0.9 % IV BOLUS
1000.0000 mL | Freq: Once | INTRAVENOUS | Status: AC
  Administered 2020-12-09: 1000 mL via INTRAVENOUS

## 2020-12-09 MED ORDER — SODIUM CHLORIDE 0.9 % IV SOLN
100.0000 mg | Freq: Every day | INTRAVENOUS | Status: DC
  Administered 2020-12-10: 100 mg via INTRAVENOUS
  Filled 2020-12-09: qty 100

## 2020-12-09 MED ORDER — POTASSIUM CHLORIDE 10 MEQ/100ML IV SOLN
10.0000 meq | INTRAVENOUS | Status: AC
  Administered 2020-12-09 (×3): 10 meq via INTRAVENOUS
  Filled 2020-12-09 (×3): qty 100

## 2020-12-09 MED ORDER — SODIUM CHLORIDE 0.9 % IV SOLN: Freq: Once | INTRAVENOUS | Status: AC

## 2020-12-09 NOTE — ED Notes (Signed)
Patient transported to CT 

## 2020-12-09 NOTE — ED Notes (Signed)
Attempted to call report to floor 2 additional times and was unable to speak to RN for report.

## 2020-12-09 NOTE — ED Notes (Signed)
Attempted report to floor. Was told would receive a call back.

## 2020-12-09 NOTE — ED Notes (Signed)
ED Provider at bedside. 

## 2020-12-09 NOTE — ED Notes (Signed)
Pt was placed on bedpan for urine sample. Could not go. Pt given call bell and instructed to call when she was able to go.

## 2020-12-09 NOTE — ED Provider Notes (Signed)
Dana [name]   CSN: [id number] Arrival date & time: 12/09/20  1629     History Chief Complaint  Patient presents with  . Fall  . Weakness    Covid +    Dana [name] is a 80 y.o. female.  The history is provided by the patient.  Weakness Severity:  Moderate Onset quality:  Gradual Timing:  Constant Progression:  Unchanged Chronicity:  New Context: not recent infection   Context comment:  Two falls, one last night and one this morning, body felt so weak so she fell and to weak to get up, No loc. Uses walker at times. Crawled to a phone and was on the floor for hours. Relieved by:  Nothing Worsened by:  Nothing Associated symptoms: arthralgias (right knee pain)   Associated symptoms: no abdominal pain, no chest pain, no cough, no dizziness, no dysuria, no fever, no loss of consciousness, no seizures, no shortness of breath and no vomiting        Past Medical History:  Diagnosis Date  . Anemia   . Cataract   . Diverticulosis 09/03/2020  . Environmental allergies   . Gastric ulcer   . Heartburn   . Hiatal hernia   . Hyperlipidemia   . Hypertension   . Hypothyroidism   . Migraines   . Prediabetes   . Rash october 2012   all over abdomen and breasts  . Rheumatoid arthritis(714.0)   . Tremor, essential    right hand    Patient Active Problem List   Diagnosis Date Noted  . Diverticulosis 09/03/2020  . Aortic atherosclerosis ([place]) 08/17/2020  . Osteopenia 12/05/2018  . FHx: heart disease 09/03/2018  . Former smoker 09/03/2018  . Overweight (BMI 25.0-29.9) 05/30/2018  . Generalized OA 09/02/2015  . Other long term (current) drug therapy 09/02/2015  . Vitamin D deficiency 01/06/2014  . Medication management 01/06/2014  . Hypertension   . Other abnormal glucose   . Rheumatoid arthritis ([place])   . Hereditary essential tremor   . Hypothyroidism   . Migraines   . Hyperlipidemia, mixed   . Anemia     Past  Surgical History:  Procedure Laterality Date  . ABDOMINAL HYSTERECTOMY    . CATARACT EXTRACTION, BILATERAL    . CHOLECYSTECTOMY  08/31/11   lap chole   . COLONOSCOPY    . TUBAL LIGATION       OB History   No obstetric history on file.     Family History  Problem Relation Age of Onset  . Heart disease Mother   . Diabetes Mother   . Heart disease Father   . Heart disease Maternal Aunt        x3   . Heart disease Maternal Uncle        x 4  . Diabetes Sister        borderline  . Heart attack Son 68       Sudden cardiac death  . Colon cancer Neg Hx     Social History   Tobacco Use  . Smoking status: Former Smoker    Types: Cigarettes    Quit date: 09/18/1984    Years since quitting: 36.2  . Smokeless tobacco: Never Used  . Tobacco comment: stopped around 25 years ago  Substance Use Topics  . Alcohol use: No  . Drug use: No    Home Medications Prior to Admission medications   Medication Sig Start Date End Date Taking? Authorizing Provider  Acetaminophen (TYLENOL PO) Take 500 mg by mouth. Take two tablets at bedtime   Yes [provider]  Cholecalciferol (VITAMIN D) 2000 UNITS CAPS Take by mouth. Take 5 capsules by mouth once daily   Yes [provider]  Cyanocobalamin (VITAMIN B 12 PO) Place 1 tablet under the tongue daily.   Yes [provider]  diazepam (VALIUM) 5 MG tablet Take 1 tablet 3 x /day for Tremor 08/13/20  Yes Unk Pinto, MD  dicyclomine (BENTYL) 20 MG tablet Take      1 tablet       3 x /day       before Meals        for Cramping, Bloating , Nausea or Diarrhea 10/07/20  Yes Unk Pinto, MD  folic acid (FOLVITE) 193 MCG tablet Take 1 tablet daily. 05/19/20  Yes Unk Pinto, MD  furosemide (LASIX) 40 MG tablet TAKE 1 TABLET 2  TIMES DAILY FOR FLUID  RETENTION AND ANKLE  SWELLING 05/19/20  Yes Unk Pinto, MD  gabapentin (NEURONTIN) 300 MG capsule Take 1 capsule 3 x  /day for Neuropathy Pain  or Itching 03/16/20    Unk Pinto, MD  hydroxychloroquine (PLAQUENIL) 200 MG tablet TAKE 1 TABLET BY MOUTH  TWICE A DAY 03/22/17   [provider]  levothyroxine (SYNTHROID) 100 MCG tablet Take      1 tablet       Daily       on an empty stomach with only water for 30 minutes & no Antacid meds, Calcium or Magnesium for 4 hours & avoid Biotin 10/30/20   Unk Pinto, MD  methotrexate (RHEUMATREX) 2.5 MG tablet Take 5 tablets on Tues and 5 tablets on Wed 02/10/17   [provider]  montelukast (SINGULAIR) 10 MG tablet Take     1 tablet     Daily      for Allergies 11/01/20   Unk Pinto, MD  ondansetron (ZOFRAN ODT) 8 MG disintegrating tablet 8mg  ODT q4 hours prn nausea 04/19/16   Rolene Course, PA-C  pravastatin (PRAVACHOL) 40 MG tablet TAKE 1 TABLET BY MOUTH  DAILY 02/19/19   Unk Pinto, MD  propranolol (INDERAL) 80 MG tablet TAKE 1 TABLET BY MOUTH 3  TIMES DAILY 02/19/19   Unk Pinto, MD    Allergies    Codeine and Mysoline [primidone]  Review of Systems   Review of Systems  Constitutional: Positive for activity change and fatigue. Negative for chills and fever.  HENT: Negative for ear pain and sore throat.   Eyes: Negative for pain and visual disturbance.  Respiratory: Negative for cough and shortness of breath.   Cardiovascular: Negative for chest pain and palpitations.  Gastrointestinal: Negative for abdominal pain and vomiting.  Genitourinary: Negative for dysuria and hematuria.  Musculoskeletal: Positive for arthralgias (right knee pain) and gait problem (pain in right leg). Negative for back pain.  Skin: Negative for color change, rash and wound.  Neurological: Positive for tremors and weakness. Negative for dizziness, seizures, loss of consciousness, syncope and facial asymmetry.  All other systems reviewed and are negative.   Physical Exam Updated Vital Signs BP (!) 113/54   Pulse 69   Temp 99.8 F (37.7 C) (Rectal)   Resp 19   Ht 5\' 3"  (1.6 m)   Wt 60.3  kg   SpO2 100%   BMI 23.56 kg/m   Physical Exam Vitals and nursing [name] reviewed.  Constitutional:      General: She is not in acute  distress.    Appearance: She is well-developed and well-nourished. She is not ill-appearing.  HENT:     Head: Normocephalic and atraumatic.     Mouth/Throat:     Mouth: Mucous membranes are dry.  Eyes:     Extraocular Movements: Extraocular movements intact.     Conjunctiva/sclera: Conjunctivae normal.     Pupils: Pupils are equal, round, and reactive to light.  Cardiovascular:     Rate and Rhythm: Normal rate and regular rhythm.     Pulses: Normal pulses.     Heart sounds: Normal heart sounds. No murmur heard.   Pulmonary:     Effort: Pulmonary effort is normal. No respiratory distress.     Breath sounds: Normal breath sounds.  Abdominal:     Palpations: Abdomen is soft.     Tenderness: There is no abdominal tenderness.  Musculoskeletal:        General: Tenderness (ttp to right knee) present. No edema.     Cervical back: Normal range of motion and neck supple. No tenderness.  Skin:    General: Skin is warm and dry.  Neurological:     Mental Status: She is alert and oriented to person, place, and time.     Cranial Nerves: No cranial nerve deficit.     Sensory: No sensory deficit.     Motor: No weakness.     Coordination: Coordination normal.  Psychiatric:        Mood and Affect: Mood and affect normal.     ED Results / Procedures / Treatments   Labs (all labs ordered are listed, but only abnormal results are displayed) Labs Reviewed  RESP PANEL BY RT-PCR (FLU A&B, COVID) ARPGX2 - Abnormal; Notable for the following components:      Result Value   SARS Coronavirus 2 by RT PCR POSITIVE (*)    All other components within normal limits  CBC WITH DIFFERENTIAL/PLATELET - Abnormal; Notable for the following components:   RBC 3.25 (*)    Hemoglobin 11.0 (*)    HCT 33.9 (*)    MCV 104.3 (*)    All other components within normal limits   COMPREHENSIVE METABOLIC PANEL - Abnormal; Notable for the following components:   Potassium 2.9 (*)    Calcium 8.6 (*)    Total Protein 5.6 (*)    Albumin 3.2 (*)    AST 55 (*)    All other components within normal limits  CK - Abnormal; Notable for the following components:   Total CK 1,716 (*)    All other components within normal limits  URINE CULTURE  LIPASE, BLOOD  URINALYSIS, ROUTINE W REFLEX MICROSCOPIC    EKG EKG Interpretation  Date/Time:  Wednesday December 09 2020 17:12:13 EST Ventricular Rate:  65 PR Interval:    QRS Duration: 97 QT Interval:  506 QTC Calculation: 527 R Axis:   57 Text Interpretation: Sinus rhythm Atrial premature complex Prolonged QT interval Confirmed by Lennice Sites [phone]) on 12/09/2020 6:17:59 PM   Radiology No results found.  Procedures Procedures (including critical care time)  Medications Ordered in ED Medications  sodium chloride 0.9 % bolus 1,000 mL (1,000 mLs Intravenous New Bag/Given 12/09/20 1721)    ED Course  I have reviewed the triage vital signs and the nursing notes.  Pertinent labs & imaging results that were available during my care of the patient were reviewed by me and considered in my medical decision making (see chart for details).    MDM Rules/Calculators/A&P  Kimiko Dorin is an 80 year old female with history of rheumatoid arthritis, essential tremor, high cholesterol presents to the ED with generalized weakness, fall.  Patient with overall unremarkable vitals.  Temperature 99.8.  States that she fell last night thought it was a mechanical fall.  She got up earlier this morning and felt very weak and fell to the ground.  Did not lose consciousness.  Was on the ground for multiple hours that she was too weak to get up.  She typically ambulates without any assistance but sometimes she uses a walker.  She lives by her self.  Patient overall appears chronically ill and deconditioned.   Dry mucous membranes.  Is having pain in the right knee but otherwise exam is unremarkable.  She has a slight resting tremor.  Overall suspect some failure to thrive.  We will check screening labs.  We will look for urine infection, chest infection.  We will scan her head and neck from a traumatic standpoint.  We will also get x-rays of her chest, pelvis, right knee.  Anticipate admission for hydration and physical therapy.  Patient is COVID-positive.  CK is 2000.  Otherwise lab work is unremarkable.  CT imaging shows no injury to the head or neck.  X-rays of the extremity showed no injuries.  Does have small knee effusion on the right likely from trauma.  Overall patient is deconditioned and weak likely secondary to COVID.  Needs fluid hydration, PT and OT.  Will admit to medicine.  Right now unable to test oxygenation with ambulation secondary to weakness and pain.  This chart was dictated using voice recognition software.  Despite best efforts to proofread,  errors can occur which can change the documentation meaning.    Final Clinical Impression(s) / ED Diagnoses Final diagnoses:  Weakness  COVID-19    Rx / DC Orders ED Discharge Orders    None       Lennice Sites, DO 12/09/20 1913

## 2020-12-09 NOTE — ED Triage Notes (Signed)
Per EMS:  Pt from home.  Fell x 2, last night 1830, this am at 0830.  Pt found by family lying in the floor.  Assuming she had been lying for 4 hours.  Pt injured right knee on second fall.  No deformities, some swelling.  Pt has some neuropathy in her feet and pt relates it has increased recently.  No head injury, no LOC, no back pain.  Speech is baseline for patient.  Stroke screen negative.  Pt is NOT on blood thinners.  VSS

## 2020-12-09 NOTE — Progress Notes (Signed)
Pt pulse ox switched from the finger probe to the ear probe. SpO2 90-96% on room air. No distress noted. RT will continue to monitor and be available as needed.

## 2020-12-09 NOTE — ED Notes (Signed)
Given gingerale and health choice dinner. States," I am hungry and I haven't eaten all day "

## 2020-12-09 NOTE — ED Notes (Signed)
Pt on monitor and vitals cycling 

## 2020-12-09 NOTE — ED Notes (Signed)
Elta Guadeloupe (son) called and was given update. Would like a phone call when pt arrives to transfer facility. [phone]

## 2020-12-10 ENCOUNTER — Inpatient Hospital Stay (HOSPITAL_COMMUNITY): Payer: Medicare HMO

## 2020-12-10 DIAGNOSIS — E876 Hypokalemia: Secondary | ICD-10-CM

## 2020-12-10 DIAGNOSIS — M25561 Pain in right knee: Secondary | ICD-10-CM

## 2020-12-10 DIAGNOSIS — T796XXA Traumatic ischemia of muscle, initial encounter: Secondary | ICD-10-CM | POA: Diagnosis not present

## 2020-12-10 DIAGNOSIS — W19XXXA Unspecified fall, initial encounter: Secondary | ICD-10-CM

## 2020-12-10 DIAGNOSIS — U071 COVID-19: Secondary | ICD-10-CM

## 2020-12-10 LAB — LACTATE DEHYDROGENASE: LDH: 251 U/L — ABNORMAL HIGH (ref 98–192)

## 2020-12-10 LAB — URINALYSIS, ROUTINE W REFLEX MICROSCOPIC
Bilirubin Urine: NEGATIVE
Glucose, UA: NEGATIVE mg/dL
Hgb urine dipstick: NEGATIVE
Ketones, ur: 20 mg/dL — AB
Nitrite: NEGATIVE
Protein, ur: 100 mg/dL — AB
Specific Gravity, Urine: 1.023 (ref 1.005–1.030)
WBC, UA: >50 WBC/hpf — ABNORMAL HIGH (ref 0–5)
pH: 5 (ref 5.0–8.0)

## 2020-12-10 LAB — FERRITIN: Ferritin: 104 ng/mL (ref 11–307)

## 2020-12-10 LAB — D-DIMER, QUANTITATIVE: D-Dimer, Quant: 1.78 ug/mL-FEU — ABNORMAL HIGH (ref 0.00–0.50)

## 2020-12-10 LAB — C-REACTIVE PROTEIN: CRP: 8.2 mg/dL — ABNORMAL HIGH (ref <1.0)

## 2020-12-10 LAB — FIBRINOGEN: Fibrinogen: 403 mg/dL (ref 210–475)

## 2020-12-10 LAB — MAGNESIUM: Magnesium: 2.1 mg/dL (ref 1.7–2.4)

## 2020-12-10 MED ORDER — SODIUM CHLORIDE 0.9 % IV SOLN: Freq: Once | INTRAVENOUS | Status: AC

## 2020-12-10 MED ORDER — LEVOTHYROXINE SODIUM 100 MCG PO TABS
100.0000 ug | ORAL_TABLET | Freq: Every day | ORAL | Status: DC
  Administered 2020-12-11 – 2020-12-13 (×3): 100 ug via ORAL
  Filled 2020-12-10 (×3): qty 1

## 2020-12-10 MED ORDER — SODIUM CHLORIDE 0.9 % IV SOLN: INTRAVENOUS | Status: AC

## 2020-12-10 MED ORDER — ACETAMINOPHEN 650 MG RE SUPP: 650.0000 mg | Freq: Four times a day (QID) | RECTAL | Status: DC | PRN

## 2020-12-10 MED ORDER — DICYCLOMINE HCL 20 MG PO TABS
20.0000 mg | ORAL_TABLET | Freq: Three times a day (TID) | ORAL | Status: DC
  Administered 2020-12-10 – 2020-12-13 (×9): 20 mg via ORAL
  Filled 2020-12-10 (×11): qty 1

## 2020-12-10 MED ORDER — ACETAMINOPHEN 325 MG PO TABS
650.0000 mg | ORAL_TABLET | Freq: Four times a day (QID) | ORAL | Status: DC | PRN
  Administered 2020-12-11 – 2020-12-13 (×2): 650 mg via ORAL
  Filled 2020-12-10 (×3): qty 2

## 2020-12-10 MED ORDER — SODIUM CHLORIDE 0.9 % IV SOLN: INTRAVENOUS | Status: DC

## 2020-12-10 MED ORDER — SODIUM CHLORIDE 0.9 % IV SOLN
100.0000 mg | Freq: Every day | INTRAVENOUS | Status: AC
  Administered 2020-12-11: 100 mg via INTRAVENOUS
  Filled 2020-12-10: qty 20

## 2020-12-10 MED ORDER — POTASSIUM CHLORIDE CRYS ER 20 MEQ PO TBCR
40.0000 meq | EXTENDED_RELEASE_TABLET | ORAL | Status: AC
  Administered 2020-12-10 (×2): 40 meq via ORAL
  Filled 2020-12-10 (×2): qty 2

## 2020-12-10 MED ORDER — SODIUM CHLORIDE 0.9 % IV SOLN
1.0000 g | INTRAVENOUS | Status: AC
  Administered 2020-12-10 – 2020-12-12 (×3): 1 g via INTRAVENOUS
  Filled 2020-12-10 (×3): qty 10

## 2020-12-10 NOTE — Evaluation (Signed)
Physical Therapy Evaluation Patient Details Name: Dana [name] MRN: [id number] DOB: [birth year] Today's Date: 12/10/2020   History of Present Illness  Pt is an 80 year old woman found on the floor after a fall by her family after 4 hours. + rhabdomyolosist, + COVID 9. PMH: HTN, HLD, hypothyroidism, RA, essential tremor , R knee stress fx and arthritis, neuropathy. Pt reports a 40 lb weight loss in the last year without trying.  Clinical Impression   Pt admitted with above diagnosis. Patient describes "second fall" occurred without dizziness but with legs feeling numb and "just went out." Her previous fall (when she injured her knee) she was dizzy and then fell. Orthostatic BPs negative. No symptoms during evaluation. Patient able to tolerate ambulating 50 ft at very slow pace but reporting very little pain in rt knee (2/10). Discussed safety issues with returning home alone and recommendation for SNF. Patient feels she may have someone who can come stay with her and wants to discuss with her daughter.  Pt currently with functional limitations due to the deficits listed below (see PT Problem List). Pt will benefit from skilled PT to increase their independence and safety with mobility to allow discharge to the venue listed below.       Follow Up Recommendations SNF;Supervision/Assistance - 24 hour (pt pursuing family to stay with her as she does not want SNF)    Equipment Recommendations  None recommended by PT    Recommendations for Other Services       Precautions / Restrictions Precautions Precautions: Fall      Mobility  Bed Mobility               General bed mobility comments: OOB in chair    Transfers Overall transfer level: Needs assistance Equipment used: Rolling walker (2 wheeled) Transfers: Sit to/from Stand Sit to Stand: Min assist         General transfer comment: cues for hand placement, assist to rise and steady  Ambulation/Gait Ambulation/Gait  assistance: Min guard Gait Distance (Feet): 50 Feet Assistive device: Rolling walker (2 wheeled) Gait Pattern/deviations: Step-through pattern;Decreased stride length        Stairs            Wheelchair Mobility    Modified Rankin (Stroke Patients Only)       Balance Overall balance assessment: Needs assistance   Sitting balance-Leahy Scale: Fair       Standing balance-Leahy Scale: Poor Standing balance comment: reliant on RW and external support                             Pertinent Vitals/Pain Pain Assessment: 0-10 Pain Score: 2  Pain Location: rt knee Pain Descriptors / Indicators: Aching Pain Intervention(s): Limited activity within patient's tolerance;Monitored during session    Home Living Family/patient expects to be discharged to:: Private residence Living Arrangements: Alone Available Help at Discharge: Family;Available PRN/intermittently (daughter lives next door) Type of Home: House Home Access: Stairs to enter Entrance Stairs-Rails: Right Entrance Stairs-Number of Steps: 2 Home Layout: One level Home Equipment: Walker - 2 wheels;Cane - single point      Prior Function Level of Independence: Independent with assistive device(s)         Comments: ambulates with a cane, drives     Hand Dominance   Dominant Hand: Right    Extremity/Trunk Assessment   Upper Extremity Assessment Upper Extremity Assessment: Defer to OT evaluation  Lower Extremity Assessment Lower Extremity Assessment: Generalized weakness (rt knee edematous)    Cervical / Trunk Assessment Cervical / Trunk Assessment: Normal  Communication   Communication: Expressive difficulties (tremulous)  Cognition Arousal/Alertness: Awake/alert Behavior During Therapy: WFL for tasks assessed/performed Overall Cognitive Status: Within Functional Limits for tasks assessed                                        General Comments      Exercises      Assessment/Plan    PT Assessment Patient needs continued PT services  PT Problem List Decreased strength;Decreased balance;Impaired sensation;Decreased mobility;Decreased knowledge of use of DME       PT Treatment Interventions DME instruction;Gait training;Functional mobility training;Therapeutic activities;Therapeutic exercise;Balance training;Patient/family education    PT Goals (Current goals can be found in the Care Plan section)  Acute Rehab PT Goals Patient Stated Goal: get stronger and return home PT Goal Formulation: With patient Time For Goal Achievement: 12/24/20 Potential to Achieve Goals: Good    Frequency Min 3X/week   Barriers to discharge Decreased caregiver support      Co-evaluation               AM-PAC PT "6 Clicks" Mobility  Outcome Measure Help needed turning from your back to your side while in a flat bed without using bedrails?: A Little Help needed moving from lying on your back to sitting on the side of a flat bed without using bedrails?: A Little Help needed moving to and from a bed to a chair (including a wheelchair)?: A Little Help needed standing up from a chair using your arms (e.g., wheelchair or bedside chair)?: A Little Help needed to walk in hospital room?: A Little Help needed climbing 3-5 steps with a railing? : A Lot 6 Click Score: 17    End of Session Equipment Utilized During Treatment: Gait belt Activity Tolerance: Patient tolerated treatment well Patient left: in chair;with call bell/phone within reach;with chair alarm set   PT Visit Diagnosis: History of falling (Z91.81)    Time: 7342-8768 PT Time Calculation (min) (ACUTE ONLY): 47 min   Charges:   PT Evaluation $PT Eval Moderate Complexity: 1 Mod PT Treatments $Gait Training: 8-22 mins         Arby Barrette, PT Pager [phone]   Rexanne Mano 12/10/2020, 5:20 PM

## 2020-12-10 NOTE — Progress Notes (Signed)
PROGRESS NOTE  Dana [name] [id number] DOB: 31-Oct-1941 DOA: 12/09/2020  PCP: Unk Pinto, MD  Brief History/Interval Summary: 80 y.o. female with medical history significant of hypertension, hyperlipidemia, hypothyroidism, rheumatoid arthritis, essential tremor presented to ED via EMS after she fell twice at home.  Family found the patient lying on the floor.   She has had multiple falls over the last several days.  Also some mention of lightheadedness and dizziness.  No injuries noted on imaging studies.  She was found to be positive for COVID-19.  Chest x-ray however did not show any active disease.  Reason for Visit: Frequent falls secondary to physical deconditioning  Consultants: None  Procedures: None yet  Antibiotics: Anti-infectives (From admission, onward)   Start     Dose/Rate Route Frequency Ordered Stop   12/10/20 1000  remdesivir 100 mg in sodium chloride 0.9 % 100 mL IVPB        100 mg 200 mL/hr over 30 Minutes Intravenous Daily 12/09/20 1959 12/14/20 0959   12/09/20 2030  remdesivir 100 mg in sodium chloride 0.9 % 100 mL IVPB       "Followed by" Linked Group Details   100 mg 200 mL/hr over 30 Minutes Intravenous  Once 12/09/20 1959 12/09/20 2207   12/09/20 2000  remdesivir 100 mg in sodium chloride 0.9 % 100 mL IVPB       "Followed by" Linked Group Details   100 mg 200 mL/hr over 30 Minutes Intravenous  Once 12/09/20 1959 12/09/20 2123      Subjective/Interval History: Patient noted to be lethargic but easily arousable.  She complains of pain in the right knee area.  Denies any chest pain shortness of breath.    Assessment/Plan:  Acute traumatic rhabdomyolysis CK level was elevated at 1716.  Apparently she was found on the floor by family members.  Possibly down for several hours.  Continue with IV fluids.  Recheck CK levels tomorrow.  COVID-19 infection Noted to be incidentally positive.  Patient does not have any respiratory symptoms per se.   Inflammatory markers noted to be elevated.  She is saturating normal on room air.  Give her 3-day course of Remdesivir.  Hold off on steroids.  Recurrent falls/physical deconditioning Patient reported feeling dizzy prior to fall but denied any loss of consciousness.  CT of the head was unremarkable for any acute process.  PT and OT evaluation.  Patient noted to be on Valium at home which could increase her risk of falls.  Noted to be on diuretics at home as well.  Abnormal UA/possible UTI Patient not sure if she has dysuria or increased urinary frequency.  She does have significant WBC.  We will give her a 3-day course of ceftriaxone.  Follow-up on urine culture.  Right knee pain/hemarthrosis Tricompartmental arthritis was noted on imaging studies.  Knee does appear to be more swollen compared to the left.  CT scan was ordered of the right knee.  Moderate knee effusion was noted concerning for hemarthrosis.  Discussed with orthopedics Silvestre Gunner) .  No role for arthrocentesis as it will increase the risk of infection in the joint.  Recommends compression wrap and elevation of the lower extremity.  Weight-bear as tolerated.  Hypokalemia Likely due to diuretics.  Being repleted.  Recheck tomorrow.  Check magnesium.  Essential hypertension Monitor blood pressures closely.  Hypothyroidism Continue levothyroxine.  History of rheumatoid arthritis Stable.  Essential tremor Stable.  QT prolongation Replace electrolytes.     DVT Prophylaxis: SCDs  Code Status: Full Code Family Communication: No family at bedside.  Discussed with the patient Disposition Plan: May need higher level of care  Status is: Inpatient  Remains inpatient appropriate because:Ongoing active pain requiring inpatient pain management, IV treatments appropriate due to intensity of illness or inability to take PO and Inpatient level of care appropriate due to severity of illness   Dispo: The patient is from:  Home              Anticipated d/c is to: SNF              Anticipated d/c date is: 3 days              Patient currently is not medically stable to d/c.      Medications:  Scheduled: . potassium chloride  40 mEq Oral Q4H   Continuous: . sodium chloride 150 mL/hr at 12/10/20 0958  . remdesivir 100 mg in NS 100 mL 100 mg (12/10/20 1008)   [id number] **OR** acetaminophen   Objective:  Vital Signs  Vitals:   12/09/20 2335 12/10/20 0648 12/10/20 0758 12/10/20 1008  BP: (!) 107/45 (!) 110/47 (!) 93/44 (!) 115/51  Pulse: 63 66 70 73  Resp: 16 17 16 15   Temp: 98.3 F (36.8 C) 97.9 F (36.6 C) 97.6 F (36.4 C) 97.6 F (36.4 C)  TempSrc: Oral Oral  Oral  SpO2: 100% 100% 95% 97%  Weight:      Height:        Intake/Output Summary (Last 24 hours) at 12/10/2020 1111 Last data filed at 12/10/2020 0300 Gross per 24 hour  Intake 1640 ml  Output --  Net 1640 ml   Filed Weights   12/09/20 1652  Weight: 60.3 kg    General appearance: Lethargic but easily arousable.  Essential tremors noted Resp: Clear to auscultation bilaterally.  Normal effort Cardio: S1-S2 is normal regular.  No S3-S4.  No rubs murmurs or bruit GI: Abdomen is soft.  Nontender nondistended.  Bowel sounds are present normal.  No masses organomegaly Extremities: Right knee swelling noted with restricted range of motion.  Tender.  No erythema.  No warmth. Neurologic: Essential tremors.  No focal neurological deficits.    Lab Results:  Data Reviewed: I have personally reviewed following labs and imaging studies  CBC: Recent Labs  Lab 12/09/20 1713  WBC 5.7  NEUTROABS 3.8  HGB 11.0*  HCT 33.9*  MCV 104.3*  PLT [id number]    Basic Metabolic Panel: Recent Labs  Lab 12/09/20 1713 12/10/20 0806  NA 139  --   K 2.9*  --   CL 99  --   CO2 29  --   GLUCOSE 89  --   BUN 16  --   CREATININE 0.71  --   CALCIUM 8.6*  --   MG  --  2.1    GFR: Estimated Creatinine Clearance: 45.6 mL/min (by C-G  formula based on SCr of 0.71 mg/dL).  Liver Function Tests: Recent Labs  Lab 12/09/20 1713  AST 55*  ALT 22  ALKPHOS 70  BILITOT 0.9  PROT 5.6*  ALBUMIN 3.2*    Recent Labs  Lab 12/09/20 1713  LIPASE 26    Cardiac Enzymes: Recent Labs  Lab 12/09/20 1713  CKTOTAL 1,716*    Anemia Panel: Recent Labs    12/10/20 0806  FERRITIN 104    Recent Results (from the past 240 hour(s))  Resp Panel by RT-PCR (Flu A&B, Covid) Nasopharyngeal Swab  Status: Abnormal   Collection Time: 12/09/20  5:24 PM   Specimen: Nasopharyngeal Swab; Nasopharyngeal(NP) swabs in vial transport medium  Result Value Ref Range Status   SARS Coronavirus 2 by RT PCR POSITIVE (A) NEGATIVE Final    Comment: RESULT CALLED TO, READ BACK BY AND VERIFIED WITH: MARVA SIMMS RN @1825  12/09/2020 OLSONM (NOTE) SARS-CoV-2 target nucleic acids are DETECTED.  The SARS-CoV-2 RNA is generally detectable in upper respiratory specimens during the acute phase of infection. Positive results are indicative of the presence of the identified virus, but do not rule out bacterial infection or co-infection with other pathogens not detected by the test. Clinical correlation with patient history and other diagnostic information is necessary to determine patient infection status. The expected result is Negative.  Fact Sheet for Patients: EntrepreneurPulse.com.au  Fact Sheet for Healthcare Providers: IncredibleEmployment.be  This test is not yet approved or cleared by the [place] FDA and  has been authorized for detection and/or diagnosis of SARS-CoV-2 by FDA under an Emergency Use Authorization (EUA).  This EUA will remain in effect (meaning this test c an be used) for the duration of  the COVID-19 declaration under Section 564(b)(1) of the Act, 21 U.S.C. section 360bbb-3(b)(1), unless the authorization is terminated or revoked sooner.     Influenza A by PCR NEGATIVE  NEGATIVE Final   Influenza B by PCR NEGATIVE NEGATIVE Final    Comment: (NOTE) The Xpert Xpress SARS-CoV-2/FLU/RSV plus assay is intended as an aid in the diagnosis of influenza from Nasopharyngeal swab specimens and should not be used as a sole basis for treatment. Nasal washings and aspirates are unacceptable for Xpert Xpress SARS-CoV-2/FLU/RSV testing.  Fact Sheet for Patients: EntrepreneurPulse.com.au  Fact Sheet for Healthcare Providers: IncredibleEmployment.be  This test is not yet approved or cleared by the [place] FDA and has been authorized for detection and/or diagnosis of SARS-CoV-2 by FDA under an Emergency Use Authorization (EUA). This EUA will remain in effect (meaning this test can be used) for the duration of the COVID-19 declaration under Section 564(b)(1) of the Act, 21 U.S.C. section 360bbb-3(b)(1), unless the authorization is terminated or revoked.  Performed at [facility], [address], [place], [place] [address]       Radiology Studies: DG Chest 2 View  Result Date: 12/09/2020 CLINICAL DATA:  Pain, fall EXAM: CHEST - 2 VIEW COMPARISON:  12/26/2018 FINDINGS: No focal opacity or pleural effusion. Stable cardiomediastinal silhouette with aortic atherosclerosis. No pneumothorax is seen. IMPRESSION: No active cardiopulmonary disease. Electronically Signed   By: Donavan Foil M.D.   On: 12/09/2020 19:00   DG Knee 2 Views Right  Result Date: 12/09/2020 CLINICAL DATA:  Fall with knee pain EXAM: RIGHT KNEE - 1-2 VIEW COMPARISON:  MRI 05/08/2020 FINDINGS: Linear sclerosis in the proximal tibia likely due to healing stress fracture. Mild tricompartment arthritis. Small knee effusion. IMPRESSION: Linear sclerosis in the proximal tibia likely due to healing stress fracture. Tricompartment arthritis with small knee effusion Electronically Signed   By: Donavan Foil M.D.   On: 12/09/2020 18:58   CT Head Wo  Contrast  Result Date: 12/09/2020 CLINICAL DATA:  Head trauma, minor. Neck trauma. Additional history provided: Fall. EXAM: CT HEAD WITHOUT CONTRAST CT CERVICAL SPINE WITHOUT CONTRAST TECHNIQUE: Multidetector CT imaging of the head and cervical spine was performed following the standard protocol without intravenous contrast. Multiplanar CT image reconstructions of the cervical spine were also generated. COMPARISON:  Radiographs of the cervical spine 09/02/2009. FINDINGS: CT HEAD  FINDINGS Brain: Mild cerebral atrophy. Mild ill-defined hypoattenuation within the cerebral white matter is nonspecific, but compatible with chronic small vessel ischemic disease. There is no acute intracranial hemorrhage. No demarcated cortical infarct. No extra-axial fluid collection. No evidence of intracranial mass. No midline shift. Vascular: No hyperdense vessel.  Atherosclerotic calcifications. Skull: Normal. Negative for fracture or focal lesion. Sinuses/Orbits: Visualized orbits show no acute finding. Mild bilateral ethmoid, sphenoid and maxillary sinus mucosal thickening. Small right maxillary sinus mucous retention cyst. CT CERVICAL SPINE FINDINGS Alignment: Straightening of the expected cervical lordosis. Trace C4-C5 grade 1 retrolisthesis. Skull base and vertebrae: The basion-dental and atlanto-dental intervals are maintained.No evidence of acute fracture to the cervical spine. Soft tissues and spinal canal: No prevertebral fluid or swelling. No visible canal hematoma. Disc levels: Cervical spondylosis with multilevel disc space narrowing, disc bulges and uncovertebral hypertrophy. Disc space narrowing is advanced at C4-C5 and C5-C6. Upper chest: No consolidation within the imaged lung apices. No visible pneumothorax. IMPRESSION: CT head: 1. No evidence of acute intracranial abnormality. 2. Mild cerebral atrophy and chronic small vessel ischemic disease. 3. Mild paranasal sinus mucosal thickening. Small right maxillary sinus  mucous retention cyst. CT cervical spine: 1. No evidence of acute fracture to the cervical spine 2. Minimal C4-C5 grade 1 retrolisthesis. 3. Cervical spondylosis as described. Electronically Signed   By: Kellie Simmering DO   On: 12/09/2020 18:21   CT Cervical Spine Wo Contrast  Result Date: 12/09/2020 CLINICAL DATA:  Head trauma, minor. Neck trauma. Additional history provided: Fall. EXAM: CT HEAD WITHOUT CONTRAST CT CERVICAL SPINE WITHOUT CONTRAST TECHNIQUE: Multidetector CT imaging of the head and cervical spine was performed following the standard protocol without intravenous contrast. Multiplanar CT image reconstructions of the cervical spine were also generated. COMPARISON:  Radiographs of the cervical spine 09/02/2009. FINDINGS: CT HEAD FINDINGS Brain: Mild cerebral atrophy. Mild ill-defined hypoattenuation within the cerebral white matter is nonspecific, but compatible with chronic small vessel ischemic disease. There is no acute intracranial hemorrhage. No demarcated cortical infarct. No extra-axial fluid collection. No evidence of intracranial mass. No midline shift. Vascular: No hyperdense vessel.  Atherosclerotic calcifications. Skull: Normal. Negative for fracture or focal lesion. Sinuses/Orbits: Visualized orbits show no acute finding. Mild bilateral ethmoid, sphenoid and maxillary sinus mucosal thickening. Small right maxillary sinus mucous retention cyst. CT CERVICAL SPINE FINDINGS Alignment: Straightening of the expected cervical lordosis. Trace C4-C5 grade 1 retrolisthesis. Skull base and vertebrae: The basion-dental and atlanto-dental intervals are maintained.No evidence of acute fracture to the cervical spine. Soft tissues and spinal canal: No prevertebral fluid or swelling. No visible canal hematoma. Disc levels: Cervical spondylosis with multilevel disc space narrowing, disc bulges and uncovertebral hypertrophy. Disc space narrowing is advanced at C4-C5 and C5-C6. Upper chest: No consolidation  within the imaged lung apices. No visible pneumothorax. IMPRESSION: CT head: 1. No evidence of acute intracranial abnormality. 2. Mild cerebral atrophy and chronic small vessel ischemic disease. 3. Mild paranasal sinus mucosal thickening. Small right maxillary sinus mucous retention cyst. CT cervical spine: 1. No evidence of acute fracture to the cervical spine 2. Minimal C4-C5 grade 1 retrolisthesis. 3. Cervical spondylosis as described. Electronically Signed   By: Kellie Simmering DO   On: 12/09/2020 18:21   CT KNEE RIGHT WO CONTRAST  Result Date: 12/10/2020 CLINICAL DATA:  Fall 2 days ago, right lateral knee pain. EXAM: CT OF THE right KNEE WITHOUT CONTRAST TECHNIQUE: Multidetector CT imaging of the right knee was performed according to the standard protocol. Multiplanar CT image  reconstructions were also generated. COMPARISON:  MRI 05/08/2020 and radiograph from 12/09/2020 FINDINGS: Bones/Joint/Cartilage Sclerotic band medially in the proximal tibia corresponding to previously demonstrated tibial stress fracture. Complex layering moderate-sized knee effusion favoring hemarthrosis. No definite additional fracture is identified. Osteoarthritis noted with tricompartmental spurring. Ligaments Suboptimally assessed by CT. Muscles and Tendons Low-level edema tracks along the anterior margin of the short head of the biceps femoris muscle, for example on image 52 of series 4. Soft tissues Distal SFA atherosclerotic calcification. IMPRESSION: 1. Sclerotic band medially in the proximal tibia corresponding to known chronic tibial stress fracture. 2. Complex layering moderate knee effusion compatible with hemarthrosis. This was not present the prior MRI of on 05/08/2020, and along with the lateral-sided pain without specific lateral explanation, the possibility of CT occult injury is raised (consider MRI for further characterization). 3. Edema tracking along the short head of the biceps femoris muscle could be an indicator  of muscle injury although no discontinuity is identified. 4. Atherosclerosis. 5. Mild to moderate osteoarthritis. Electronically Signed   By: Van Clines M.D.   On: 12/10/2020 10:34   DG Hip Unilat With Pelvis 2-3 Views Right  Result Date: 12/09/2020 CLINICAL DATA:  Fall with hip pain EXAM: DG HIP (WITH OR WITHOUT PELVIS) 2-3V RIGHT COMPARISON:  06/09/2010 FINDINGS: SI joints are non widened. Pubic symphysis and rami appear intact. Both femoral heads project in joint. No acute displaced fracture or malalignment is seen. IMPRESSION: No acute osseous abnormality. MRI follow-up may be pursued if continued clinical suspicion for acute osseous injury Electronically Signed   By: Donavan Foil M.D.   On: 12/09/2020 18:59       LOS: 1 day   [facility] Pager on [email or web address]  12/10/2020, 11:11 AM

## 2020-12-10 NOTE — Plan of Care (Signed)

## 2020-12-10 NOTE — Plan of Care (Signed)

## 2020-12-10 NOTE — Evaluation (Signed)
Occupational Therapy Evaluation Patient Details Name: Dana [name] MRN: [id number] DOB: [birth year] Today's Date: 12/10/2020    History of Present Illness Pt is an 80 year old woman found on the floor after a fall by her family after 4 hours. + rhabdomyolosist, + COVID 9. PMH: HTN, HLD, hypothyroidism, RA, essential tremor , R knee stress fx and arthritis, neuropathy. Pt reports a 40 lb weight loss in the last year without trying.   Clinical Impression   Pt walks with a cane and is independent in ADL and IADL. She drives. Her daughter lives next door, but is currently sick. Pt presents with generalized weakness and poor standing balance. She requires up to moderate assistance for mobility and set up to total assist for ADL. Recommending SNF for continued rehab. Will follow acutely.     Follow Up Recommendations  SNF;Supervision/Assistance - 24 hour    Equipment Recommendations  3 in 1 bedside commode    Recommendations for Other Services       Precautions / Restrictions Precautions Precautions: Fall Restrictions Weight Bearing Restrictions: No      Mobility Bed Mobility Overal bed mobility: Needs Assistance Bed Mobility: Supine to Sit     Supine to sit: Mod assist     General bed mobility comments: assist for LEs over EOB and to raise trunk, able to scoot hips to EOB with increased time, HOB up    Transfers Overall transfer level: Needs assistance Equipment used: Rolling walker (2 wheeled) Transfers: Sit to/from [facility] Sit to Stand: Mod assist Stand pivot transfers: Min assist       General transfer comment: cues for hand placement, assist to rise and steady, posterior lean initially    Balance Overall balance assessment: Needs assistance   Sitting balance-Leahy Scale: Fair       Standing balance-Leahy Scale: Poor Standing balance comment: reliant on RW and external support                           ADL either  performed or assessed with clinical judgement   ADL Overall ADL's : Needs assistance/impaired Eating/Feeding: Set up;Sitting   Grooming: Wash/dry hands;Wash/dry face;Oral care;Sitting;Set up   Upper Body Bathing: Moderate assistance;Sitting   Lower Body Bathing: Maximal assistance;Sit to/from stand   Upper Body Dressing : Minimal assistance;Sitting   Lower Body Dressing: Total assistance;Bed level   Toilet Transfer: Minimal assistance;Stand-pivot;BSC;RW   Toileting- Clothing Manipulation and Hygiene: Maximal assistance;Sit to/from stand               Vision Patient Visual Report: No change from baseline       Perception     Praxis      Pertinent Vitals/Pain Pain Assessment: No/denies pain     Hand Dominance Right   Extremity/Trunk Assessment Upper Extremity Assessment Upper Extremity Assessment: Generalized weakness   Lower Extremity Assessment Lower Extremity Assessment: Defer to PT evaluation       Communication Communication Communication: Expressive difficulties (tremulous)   Cognition Arousal/Alertness: Awake/alert Behavior During Therapy: WFL for tasks assessed/performed Overall Cognitive Status: Within Functional Limits for tasks assessed                                     General Comments       Exercises     Shoulder Instructions      Home Living Family/patient expects to be  discharged to:: Private residence Living Arrangements: Alone Available Help at Discharge: Family;Available PRN/intermittently (daughter lives next door) Type of Home: House Home Access: Stairs to enter [employer] of Steps: 2 Entrance Stairs-Rails: Right Home Layout: One level     Bathroom Shower/Tub: Teacher, early years/pre: Standard     Home Equipment: Environmental consultant - 2 wheels;Cane - single point          Prior Functioning/Environment Level of Independence: Independent with assistive device(s)        Comments:  ambulates with a cane, drives        OT Problem List: Decreased strength;Decreased activity tolerance;Impaired balance (sitting and/or standing);Decreased knowledge of use of DME or AE      OT Treatment/Interventions: Self-care/ADL training;DME and/or AE instruction;Therapeutic activities;Patient/family education;Balance training    OT Goals(Current goals can be found in the care plan section) Acute Rehab OT Goals Patient Stated Goal: get stronger and return home OT Goal Formulation: With patient Time For Goal Achievement: 12/24/20 Potential to Achieve Goals: Good ADL Goals Pt Will Perform Grooming: (P) with min guard assist;standing Pt Will Perform Upper Body Dressing: (P) with set-up;sitting Pt Will Perform Lower Body Dressing: (P) with min assist;sit to/from stand Pt Will Transfer to Toilet: (P) with min guard assist;ambulating;bedside commode Pt Will Perform Toileting - Clothing Manipulation and hygiene: (P) with min assist;sit to/from stand Additional ADL Goal #1: (P) Pt will perform bed mobility with min assist in preparation for ADL.  OT Frequency: Min 2X/week   Barriers to D/C:            Co-evaluation              AM-PAC OT "6 Clicks" Daily Activity     Outcome Measure Help from another person eating meals?: None Help from another person taking care of personal grooming?: A Little Help from another person toileting, which includes using toliet, bedpan, or urinal?: A Lot Help from another person bathing (including washing, rinsing, drying)?: A Lot Help from another person to put on and taking off regular upper body clothing?: A Little Help from another person to put on and taking off regular lower body clothing?: Total 6 Click Score: 15   End of Session Equipment Utilized During Treatment: Rolling walker;Gait belt Nurse Communication: Mobility status  Activity Tolerance: Patient tolerated treatment well Patient left: in chair;with call bell/phone within  reach;with chair alarm set  OT Visit Diagnosis: Unsteadiness on feet (R26.81);Other abnormalities of gait and mobility (R26.89);Muscle weakness (generalized) (M62.81)                Time: 9675-9163 OT Time Calculation (min): 47 min Charges:  OT General Charges $OT Visit: 1 Visit OT Evaluation $OT Eval Moderate Complexity: 1 Mod OT Treatments $Self Care/Home Management : 23-37 mins  Dana [name], OTR/L Acute Rehabilitation Services Pager: [phone] Office: [phone]  Dana [name] 12/10/2020, 11:28 AM

## 2020-12-10 NOTE — H&P (Signed)
History and Physical    Dana [name] [id number] DOB: 28-Jul-1941 DOA: 12/09/2020  PCP: Unk Pinto, MD Patient coming from: Home  Chief Complaint: Generalized weakness, fall  HPI: Dana [name] is a 80 y.o. female with medical history significant of hypertension, hyperlipidemia, hypothyroidism, rheumatoid arthritis, essential tremor presented to ED via EMS after she fell twice at home last night and this morning.  Family found the patient lying on the floor.  Patient states she fell a few days ago and then again yesterday.  She remembers feeling dizzy prior to her fall.  Denies loss of consciousness.  She is reporting 4 out of 10 intensity pain in her right knee since her fall a few days ago.  States she fractured the same knee about 6 months ago.  Patient has no other complaints.  Denies head injury, neck pain, back pain, or pain anywhere else from the falls.  Denies fevers, chills, fatigue, body aches, cough, shortness of breath, chest pain, nausea, vomiting, abdominal pain, diarrhea, or dysuria.  Reports numbness in her feet secondary to neuropathy for which she takes gabapentin.  ED Course: Vital signs stable on arrival.  WBC 5.7, hemoglobin 11.0 (at baseline), platelet count 192K.  Sodium 139, potassium 2.9, chloride 99, bicarb 29, BUN 16, creatinine 0.7, glucose 89.  CK 1716.  SARS-CoV-2 PCR test positive.  Influenza panel negative.  UA and urine culture pending.  Chest x-ray showing no active cardiopulmonary disease.  X-ray of right hip/pelvis negative for acute osseous abnormality.  X-ray of right knee showing linear sclerosis in the proximal tibia likely due to healing stress fracture.  Tricompartmental arthritis with small knee effusion.  CT head negative for acute intracranial abnormality.  CT C-spine negative for acute fracture. Patient was given IV potassium 10 mEq x 3, remdesivir, and 1 L normal saline bolus.  Review of Systems:  All systems reviewed and apart from  history of presenting illness, are negative.  Past Medical History:  Diagnosis Date  . Anemia   . Cataract   . Diverticulosis 09/03/2020  . Environmental allergies   . Gastric ulcer   . Heartburn   . Hiatal hernia   . Hyperlipidemia   . Hypertension   . Hypothyroidism   . Migraines   . Prediabetes   . Rash october 2012   all over abdomen and breasts  . Rheumatoid arthritis(714.0)   . Tremor, essential    right hand    Past Surgical History:  Procedure Laterality Date  . ABDOMINAL HYSTERECTOMY    . CATARACT EXTRACTION, BILATERAL    . CHOLECYSTECTOMY  08/31/11   lap chole   . COLONOSCOPY    . TUBAL LIGATION       reports that she quit smoking about 36 years ago. Her smoking use included cigarettes. She has never used smokeless tobacco. She reports that she does not drink alcohol and does not use drugs.  Allergies  Allergen Reactions  . Codeine Nausea Only  . Mysoline [Primidone] Nausea Only and Other (See Comments)    Over-sedated    Family History  Problem Relation Age of Onset  . Heart disease Mother   . Diabetes Mother   . Heart disease Father   . Heart disease Maternal Aunt        x3   . Heart disease Maternal Uncle        x 4  . Diabetes Sister        borderline  . Heart attack Son 58  Sudden cardiac death  . Colon cancer Neg Hx     Prior to Admission medications   Medication Sig Start Date End Date Taking? Authorizing Provider  Acetaminophen (TYLENOL PO) Take 500 mg by mouth. Take two tablets at bedtime   Yes [provider]  Cholecalciferol (VITAMIN D) 2000 UNITS CAPS Take by mouth. Take 5 capsules by mouth once daily   Yes [provider]  Cyanocobalamin (VITAMIN B 12 PO) Place 1 tablet under the tongue daily.   Yes [provider]  diazepam (VALIUM) 5 MG tablet Take 1 tablet 3 x /day for Tremor 08/13/20  Yes Unk Pinto, MD  dicyclomine (BENTYL) 20 MG tablet Take      1 tablet       3 x /day       before Meals         for Cramping, Bloating , Nausea or Diarrhea 10/07/20  Yes Unk Pinto, MD  folic acid (FOLVITE) [id number] MCG tablet Take 1 tablet daily. 05/19/20  Yes Unk Pinto, MD  furosemide (LASIX) 40 MG tablet TAKE 1 TABLET 2  TIMES DAILY FOR FLUID  RETENTION AND ANKLE  SWELLING 05/19/20  Yes Unk Pinto, MD  gabapentin (NEURONTIN) 300 MG capsule Take 1 capsule 3 x  /day for Neuropathy Pain  or Itching 03/16/20   Unk Pinto, MD  hydroxychloroquine (PLAQUENIL) 200 MG tablet TAKE 1 TABLET BY MOUTH  TWICE A DAY 03/22/17   [provider]  levothyroxine (SYNTHROID) 100 MCG tablet Take      1 tablet       Daily       on an empty stomach with only water for 30 minutes & no Antacid meds, Calcium or Magnesium for 4 hours & avoid Biotin 10/30/20   Unk Pinto, MD  methotrexate (RHEUMATREX) 2.5 MG tablet Take 5 tablets on Tues and 5 tablets on Wed 02/10/17   [provider]  montelukast (SINGULAIR) 10 MG tablet Take     1 tablet     Daily      for Allergies 11/01/20   Unk Pinto, MD  ondansetron (ZOFRAN ODT) 8 MG disintegrating tablet 8mg  ODT q4 hours prn nausea 04/19/16   Rolene Course, PA-C  pravastatin (PRAVACHOL) 40 MG tablet TAKE 1 TABLET BY MOUTH  DAILY 02/19/19   Unk Pinto, MD  propranolol (INDERAL) 80 MG tablet TAKE 1 TABLET BY MOUTH 3  TIMES DAILY 02/19/19   Unk Pinto, MD    Physical Exam: Vitals:   12/09/20 2000 12/09/20 2230 12/09/20 2335 12/10/20 0648  BP: (!) 118/54 (!) 114/52 (!) 107/45 (!) 110/47  Pulse: 72  63 66  Resp: 19 16 16 17   Temp:  99.2 F (37.3 C) 98.3 F (36.8 C) 97.9 F (36.6 C)  TempSrc:   Oral Oral  SpO2: 100% 99% 100% 100%  Weight:      Height:        Physical Exam Constitutional:      General: She is not in acute distress. HENT:     Head: Normocephalic and atraumatic.     Mouth/Throat:     Comments: Very dry mucous membranes Eyes:     Extraocular Movements: Extraocular movements intact.     Conjunctiva/sclera:  Conjunctivae normal.  Cardiovascular:     Rate and Rhythm: Normal rate and regular rhythm.     Pulses: Normal pulses.  Pulmonary:     Effort: Pulmonary effort is normal. No respiratory distress.     Breath sounds: Normal breath  sounds. No wheezing or rales.  Abdominal:     General: Bowel sounds are normal. There is no distension.     Palpations: Abdomen is soft.     Tenderness: There is no abdominal tenderness.  Musculoskeletal:     Cervical back: Normal range of motion and neck supple.     Comments: Right knee appears swollen with very limited range of motion.  Skin:    General: Skin is warm and dry.  Neurological:     General: No focal deficit present.     Mental Status: She is alert and oriented to person, place, and time.     Labs on Admission: I have personally reviewed following labs and imaging studies  CBC: Recent Labs  Lab 12/09/20 1713  WBC 5.7  NEUTROABS 3.8  HGB 11.0*  HCT 33.9*  MCV 104.3*  PLT [id number]   Basic Metabolic Panel: Recent Labs  Lab 12/09/20 1713  NA 139  K 2.9*  CL 99  CO2 29  GLUCOSE 89  BUN 16  CREATININE 0.71  CALCIUM 8.6*   GFR: Estimated Creatinine Clearance: 45.6 mL/min (by C-G formula based on SCr of 0.71 mg/dL). Liver Function Tests: Recent Labs  Lab 12/09/20 1713  AST 55*  ALT 22  ALKPHOS 70  BILITOT 0.9  PROT 5.6*  ALBUMIN 3.2*   Recent Labs  Lab 12/09/20 1713  LIPASE 26   No results for input(s): AMMONIA in the last 168 hours. Coagulation Profile: No results for input(s): INR, PROTIME in the last 168 hours. Cardiac Enzymes: Recent Labs  Lab 12/09/20 1713  CKTOTAL 1,716*   BNP (last 3 results) No results for input(s): PROBNP in the last 8760 hours. HbA1C: No results for input(s): HGBA1C in the last 72 hours. CBG: No results for input(s): GLUCAP in the last 168 hours. Lipid Profile: No results for input(s): CHOL, HDL, LDLCALC, TRIG, CHOLHDL, LDLDIRECT in the last 72 hours. Thyroid Function Tests: No  results for input(s): TSH, T4TOTAL, FREET4, T3FREE, THYROIDAB in the last 72 hours. Anemia Panel: No results for input(s): VITAMINB12, FOLATE, FERRITIN, TIBC, IRON, RETICCTPCT in the last 72 hours. Urine analysis:    Component Value Date/Time   COLORURINE AMBER (A) 12/10/2020 0525   APPEARANCEUR CLOUDY (A) 12/10/2020 0525   LABSPEC 1.023 12/10/2020 0525   PHURINE 5.0 12/10/2020 0525   GLUCOSEU NEGATIVE 12/10/2020 0525   HGBUR NEGATIVE 12/10/2020 0525   BILIRUBINUR NEGATIVE 12/10/2020 0525   KETONESUR 20 (A) 12/10/2020 0525   PROTEINUR 100 (A) 12/10/2020 0525   NITRITE NEGATIVE 12/10/2020 0525   LEUKOCYTESUR MODERATE (A) 12/10/2020 0525    Radiological Exams on Admission: DG Chest 2 View  Result Date: 12/09/2020 CLINICAL DATA:  Pain, fall EXAM: CHEST - 2 VIEW COMPARISON:  12/26/2018 FINDINGS: No focal opacity or pleural effusion. Stable cardiomediastinal silhouette with aortic atherosclerosis. No pneumothorax is seen. IMPRESSION: No active cardiopulmonary disease. Electronically Signed   By: Donavan Foil M.D.   On: 12/09/2020 19:00   DG Knee 2 Views Right  Result Date: 12/09/2020 CLINICAL DATA:  Fall with knee pain EXAM: RIGHT KNEE - 1-2 VIEW COMPARISON:  MRI 05/08/2020 FINDINGS: Linear sclerosis in the proximal tibia likely due to healing stress fracture. Mild tricompartment arthritis. Small knee effusion. IMPRESSION: Linear sclerosis in the proximal tibia likely due to healing stress fracture. Tricompartment arthritis with small knee effusion Electronically Signed   By: Donavan Foil M.D.   On: 12/09/2020 18:58   CT Head Wo Contrast  Result Date: 12/09/2020 CLINICAL DATA:  Head trauma, minor. Neck trauma. Additional history provided: Fall. EXAM: CT HEAD WITHOUT CONTRAST CT CERVICAL SPINE WITHOUT CONTRAST TECHNIQUE: Multidetector CT imaging of the head and cervical spine was performed following the standard protocol without intravenous contrast. Multiplanar CT image reconstructions of  the cervical spine were also generated. COMPARISON:  Radiographs of the cervical spine 09/02/2009. FINDINGS: CT HEAD FINDINGS Brain: Mild cerebral atrophy. Mild ill-defined hypoattenuation within the cerebral white matter is nonspecific, but compatible with chronic small vessel ischemic disease. There is no acute intracranial hemorrhage. No demarcated cortical infarct. No extra-axial fluid collection. No evidence of intracranial mass. No midline shift. Vascular: No hyperdense vessel.  Atherosclerotic calcifications. Skull: Normal. Negative for fracture or focal lesion. Sinuses/Orbits: Visualized orbits show no acute finding. Mild bilateral ethmoid, sphenoid and maxillary sinus mucosal thickening. Small right maxillary sinus mucous retention cyst. CT CERVICAL SPINE FINDINGS Alignment: Straightening of the expected cervical lordosis. Trace C4-C5 grade 1 retrolisthesis. Skull base and vertebrae: The basion-dental and atlanto-dental intervals are maintained.No evidence of acute fracture to the cervical spine. Soft tissues and spinal canal: No prevertebral fluid or swelling. No visible canal hematoma. Disc levels: Cervical spondylosis with multilevel disc space narrowing, disc bulges and uncovertebral hypertrophy. Disc space narrowing is advanced at C4-C5 and C5-C6. Upper chest: No consolidation within the imaged lung apices. No visible pneumothorax. IMPRESSION: CT head: 1. No evidence of acute intracranial abnormality. 2. Mild cerebral atrophy and chronic small vessel ischemic disease. 3. Mild paranasal sinus mucosal thickening. Small right maxillary sinus mucous retention cyst. CT cervical spine: 1. No evidence of acute fracture to the cervical spine 2. Minimal C4-C5 grade 1 retrolisthesis. 3. Cervical spondylosis as described. Electronically Signed   By: Kellie Simmering DO   On: 12/09/2020 18:21   CT Cervical Spine Wo Contrast  Result Date: 12/09/2020 CLINICAL DATA:  Head trauma, minor. Neck trauma. Additional  history provided: Fall. EXAM: CT HEAD WITHOUT CONTRAST CT CERVICAL SPINE WITHOUT CONTRAST TECHNIQUE: Multidetector CT imaging of the head and cervical spine was performed following the standard protocol without intravenous contrast. Multiplanar CT image reconstructions of the cervical spine were also generated. COMPARISON:  Radiographs of the cervical spine 09/02/2009. FINDINGS: CT HEAD FINDINGS Brain: Mild cerebral atrophy. Mild ill-defined hypoattenuation within the cerebral white matter is nonspecific, but compatible with chronic small vessel ischemic disease. There is no acute intracranial hemorrhage. No demarcated cortical infarct. No extra-axial fluid collection. No evidence of intracranial mass. No midline shift. Vascular: No hyperdense vessel.  Atherosclerotic calcifications. Skull: Normal. Negative for fracture or focal lesion. Sinuses/Orbits: Visualized orbits show no acute finding. Mild bilateral ethmoid, sphenoid and maxillary sinus mucosal thickening. Small right maxillary sinus mucous retention cyst. CT CERVICAL SPINE FINDINGS Alignment: Straightening of the expected cervical lordosis. Trace C4-C5 grade 1 retrolisthesis. Skull base and vertebrae: The basion-dental and atlanto-dental intervals are maintained.No evidence of acute fracture to the cervical spine. Soft tissues and spinal canal: No prevertebral fluid or swelling. No visible canal hematoma. Disc levels: Cervical spondylosis with multilevel disc space narrowing, disc bulges and uncovertebral hypertrophy. Disc space narrowing is advanced at C4-C5 and C5-C6. Upper chest: No consolidation within the imaged lung apices. No visible pneumothorax. IMPRESSION: CT head: 1. No evidence of acute intracranial abnormality. 2. Mild cerebral atrophy and chronic small vessel ischemic disease. 3. Mild paranasal sinus mucosal thickening. Small right maxillary sinus mucous retention cyst. CT cervical spine: 1. No evidence of acute fracture to the cervical spine  2. Minimal C4-C5 grade 1 retrolisthesis. 3. Cervical spondylosis as described. Electronically  Signed   By: Kellie Simmering DO   On: 12/09/2020 18:21   DG Hip Unilat With Pelvis 2-3 Views Right  Result Date: 12/09/2020 CLINICAL DATA:  Fall with hip pain EXAM: DG HIP (WITH OR WITHOUT PELVIS) 2-3V RIGHT COMPARISON:  06/09/2010 FINDINGS: SI joints are non widened. Pubic symphysis and rami appear intact. Both femoral heads project in joint. No acute displaced fracture or malalignment is seen. IMPRESSION: No acute osseous abnormality. MRI follow-up may be pursued if continued clinical suspicion for acute osseous injury Electronically Signed   By: Donavan Foil M.D.   On: 12/09/2020 18:59    EKG: Independently reviewed.  Sinus rhythm, QTC 527.  Assessment/Plan Principal Problem:   Rhabdomyolysis Active Problems:   COVID-19 virus infection   Fall   Right knee pain   Hypokalemia   Acute traumatic rhabdomyolysis: Patient has had multiple falls and was found on the floor by her family, possibly down for several hours.  CK elevated at 1716.  No evidence of AKI on labs. -Continue IV fluid hydration and trend CK  COVID-19 infection: SARS-CoV-2 PCR test positive.  Patient is not hypoxic.  Chest x-ray not suggestive of pneumonia. -Continue remdesivir.  No indication for steroids at this time.  Continue isolation precautions.  Check inflammatory markers.  Continuous pulse ox, supplemental oxygen as needed to keep oxygen saturation above 92%.  Recurrent falls: Patient reports feeling dizzy prior to her most recent fall but denies loss of consciousness.  Head CT negative for acute intracranial abnormality.  Deconditioning from acute viral illness and peripheral neuropathy likely contributing.  Dehydration from home diuretic use could also be contributing.  In addition, takes Valium at home which can also increase risk of falls. -PT/OT eval, fall precautions, check orthostatics  Right knee pain:  X-ray showing  linear sclerosis in the proximal tibia likely due to healing stress fracture.  Tricompartmental arthritis with small knee effusion.  It seems patient has had multiple falls at home and also reports injuring the same knee 6 months ago due to a fall.  Knee appears swollen with very limited range of motion. -CT of right knee ordered for further evaluation.  Pain management.  Hypokalemia: Potassium 2.9.  Not endorsing any vomiting or diarrhea.  Not on a diuretic.  Hypokalemia likely due to poor oral intake. -Replete potassium.  Check magnesium level and replete if low.  Continue to monitor electrolytes.  QT prolongation on EKG -Cardiac monitoring.  Keep potassium above 4 and magnesium above 2.  Avoid QT prolonging drugs if possible.  Hypertension -Hold antihypertensives at this time, checking orthostatics  Hyperlipidemia -Hold statin at this time.  Hypothyroidism -Resume Synthroid after pharmacy med rec is done.  Rheumatoid arthritis -Resume home meds after pharmacy med rec is done  DVT prophylaxis: SCDs at this time Code Status: Patient wishes to be full code. Family Communication: No family available at this time. Disposition Plan: Status is: Inpatient  Remains inpatient appropriate because:IV treatments appropriate due to intensity of illness or inability to take PO, Inpatient level of care appropriate due to severity of illness and History of recurrent falls and lives alone, will need placement to SNF   Dispo: The patient is from: Home              Anticipated d/c is to: SNF              Anticipated d/c date is: 2 days              Patient  currently is not medically stable to d/c.  The medical decision making on this patient was of high complexity and the patient is at high risk for clinical deterioration, therefore this is a level 3 visit.  Shela Leff MD [facility]  If 7PM-7AM, please contact night-coverage [email or web address]  12/10/2020, 7:32 AM

## 2020-12-10 NOTE — Progress Notes (Addendum)
01/20 0340  [place], APP paged for medication and diet orders.

## 2020-12-11 DIAGNOSIS — M25561 Pain in right knee: Secondary | ICD-10-CM

## 2020-12-11 DIAGNOSIS — T796XXA Traumatic ischemia of muscle, initial encounter: Secondary | ICD-10-CM | POA: Diagnosis not present

## 2020-12-11 DIAGNOSIS — U071 COVID-19: Secondary | ICD-10-CM

## 2020-12-11 LAB — BASIC METABOLIC PANEL WITH GFR
Anion gap: 7 (ref 5–15)
BUN: 12 mg/dL (ref 8–23)
CO2: 23 mmol/L (ref 22–32)
Calcium: 7.9 mg/dL — ABNORMAL LOW (ref 8.9–10.3)
Chloride: 110 mmol/L (ref 98–111)
Creatinine, Ser: 0.56 mg/dL (ref 0.44–1.00)
GFR, Estimated: >60 mL/min (ref >60)
Glucose, Bld: 88 mg/dL (ref 70–99)
Potassium: 4.1 mmol/L (ref 3.5–5.1)
Sodium: 140 mmol/L (ref 135–145)

## 2020-12-11 LAB — URINE CULTURE

## 2020-12-11 LAB — CBC
HCT: 27.8 % — ABNORMAL LOW (ref 36.0–46.0)
Hemoglobin: 9.1 g/dL — ABNORMAL LOW (ref 12.0–15.0)
MCH: 34 pg (ref 26.0–34.0)
MCHC: 32.7 g/dL (ref 30.0–36.0)
MCV: 103.7 fL — ABNORMAL HIGH (ref 80.0–100.0)
Platelets: 158 10*3/uL (ref 150–400)
RBC: 2.68 MIL/uL — ABNORMAL LOW (ref 3.87–5.11)
RDW: 15 % (ref 11.5–15.5)
WBC: 3.1 10*3/uL — ABNORMAL LOW (ref 4.0–10.5)
nRBC: 0 % (ref 0.0–0.2)

## 2020-12-11 LAB — CK: Total CK: 1056 U/L — ABNORMAL HIGH (ref 38–234)

## 2020-12-11 MED ORDER — SODIUM CHLORIDE 0.9 % IV SOLN: INTRAVENOUS | Status: DC

## 2020-12-11 MED ORDER — HYDROXYCHLOROQUINE SULFATE 200 MG PO TABS: 200.0000 mg | ORAL_TABLET | Freq: Two times a day (BID) | ORAL | Status: DC

## 2020-12-11 MED ORDER — LOPERAMIDE HCL 2 MG PO CAPS: 4.0000 mg | ORAL_CAPSULE | Freq: Three times a day (TID) | ORAL | Status: DC | PRN

## 2020-12-11 MED ORDER — HYDROXYCHLOROQUINE SULFATE 200 MG PO TABS
200.0000 mg | ORAL_TABLET | Freq: Two times a day (BID) | ORAL | Status: AC
  Administered 2020-12-11 (×2): 200 mg via ORAL
  Filled 2020-12-11 (×2): qty 1

## 2020-12-11 NOTE — TOC Initial Note (Signed)
Transition of Care [facility]) - Initial/Assessment Note    Patient Details  Name: Dana [name] MRN: [id number] Date of Birth: 1941/09/06  [facility]) CM/SW Contact:    Dana Mons, RN Phone Number: 12/11/2020, 3:17 PM  Clinical Narrative:                 Admitted with Rhabdomylosis, positive COVID. Hx of multi. falls,hypertension, hyperlipidemia, hypothyroidism, rheumatoid arthritis, essential tremor. Resides alone. Dana [name] ( daughter ) lives next door. PTA independent with ADL's , no DME usage.  PCP: Dana [name]  NCM spoke with pt and daughter Dana [name] by phone regarding TOC needs. Shared PT eval/ recommendation: SNF;Supervision/Assistance - 24 hour (pt pursuing family to stay with her as she does not want SNF. Both confirmed d/c plan will be home. Agreeable to [facility] services. Pt without preference for [facility] agency...referral made with [facility] and accepted pending MD orders and completed F2F.  TOC team will continue to monitor and assist with TOC needs....   Expected Discharge Plan: [place] (declined SNFplacement, lives alone , daughter Dana [name] lives next door) Barriers to Discharge: Continued Medical Work up   Patient Goals and CMS Choice     Choice offered to / list presented to : Adult Children  Expected Discharge Plan and Services Expected Discharge Plan: [place] (declined SNFplacement, lives alone , daughter Dana [name] lives next door)   Discharge Planning Services: CM Consult   Living arrangements for the past 2 months: Single Family Home                                      Prior Living Arrangements/Services Living arrangements for the past 2 months: Single Family Home Lives with:: Self Patient language and need for interpreter reviewed:: Yes Do you feel safe going back to the place where you live?: Yes      Need for Family Participation in Patient Care: Yes (Comment) Care giver support system  in place?: Yes (comment) Current home services: DME (cane) Criminal Activity/Legal Involvement Pertinent to Current Situation/Hospitalization: No - Comment as needed  Activities of Daily Living Home Assistive Devices/Equipment: Walker (specify type),Cane (specify quad or straight) ADL Screening (condition at time of admission) Patient's cognitive ability adequate to safely complete daily activities?: Yes Is the patient deaf or have difficulty hearing?: No Does the patient have difficulty seeing, even when wearing glasses/contacts?: No Does the patient have difficulty concentrating, remembering, or making decisions?: No Patient able to express need for assistance with ADLs?: Yes Does the patient have difficulty dressing or bathing?: No Independently performs ADLs?: Yes (appropriate for developmental age) Does the patient have difficulty walking or climbing stairs?: Yes Weakness of Legs: Right Weakness of Arms/Hands: None  Permission Sought/Granted   Permission granted to share information with : Yes, Verbal Permission Granted  Share Information with NAME: Dana [name] (Daughter) [phone]           Emotional Assessment Appearance:: Appears stated age Attitude/Demeanor/Rapport: Gracious Affect (typically observed): Accepting Orientation: : Oriented to Self,Oriented to Place,Oriented to  Time,Oriented to Situation Alcohol / Substance Use: Illicit Drugs Psych Involvement: No (comment)  Admission diagnosis:  Rhabdomyolysis [M62.82] Weakness [R53.1] COVID-19 [U07.1] Patient Active Problem List   Diagnosis Date Noted   COVID-19 virus infection 12/10/2020   Fall 12/10/2020   Right knee pain 12/10/2020   Hypokalemia 12/10/2020   Rhabdomyolysis 12/09/2020  Diverticulosis 09/03/2020   Aortic atherosclerosis ([place]) 08/17/2020   Osteopenia 12/05/2018   FHx: heart disease 09/03/2018   Former smoker 09/03/2018   Overweight (BMI 25.0-29.9) 05/30/2018   Generalized  OA 09/02/2015   Other long term (current) drug therapy 09/02/2015   Vitamin D deficiency 01/06/2014   Medication management 01/06/2014   Hypertension    Other abnormal glucose    Rheumatoid arthritis ([place])    Hereditary essential tremor    Hypothyroidism    Migraines    Hyperlipidemia, mixed    Anemia    PCP:  Dana Pinto, MD Pharmacy:   [facility], [address] [place] [address] Phone: [phone] Fax: [place], [address], [address] [place], [place] [address] Phone: [phone] Fax: [facility] [place], [place] [address] Phone: [phone] Fax: [phone]     Social Determinants of Health (SDOH) Interventions    Readmission Risk Interventions No flowsheet data found.

## 2020-12-11 NOTE — Progress Notes (Signed)
Physical Therapy Treatment Patient Details Name: Dana [name] MRN: [id number] DOB: 06-13-1941 Today's Date: 12/11/2020    History of Present Illness Pt is an 80 year old woman found on the floor after a fall by her family after 4 hours. + rhabdomyolosist, + COVID 9. PMH: HTN, HLD, hypothyroidism, RA, essential tremor , R knee stress fx and arthritis, neuropathy. Pt reports a 40 lb weight loss in the last year without trying.    PT Comments    Pt up in chair upon arrival, agreeable to therapy session and with good participation and tolerance for mobility. Pt performed gait trial 13ft in room using RW needing up to minA as she has difficulty managing RW at times, especially during turns. Pt SpO2 WNL on RA during session. Pt min guard to minA for sit<>stand from chair/toilet heights and needs cues for hand placement with poor carryover. Pt reporting feeling "foggy" the past few days. Pt with overall decreased safety awareness and will benefit from strict 24/7 supervision/assist upon discharge. Pt continues to benefit from PT services to progress toward functional mobility goals.    Follow Up Recommendations  SNF;Supervision/Assistance - 24 hour (pt plans to have daughter stay with her as she does not want SNF)     Equipment Recommendations  None recommended by PT    Recommendations for Other Services       Precautions / Restrictions Precautions Precautions: Fall Restrictions Weight Bearing Restrictions: No    Mobility  Bed Mobility               General bed mobility comments: OOB in chair on arrival  Transfers Overall transfer level: Needs assistance Equipment used: Rolling walker (2 wheeled) Transfers: Sit to/from Stand Sit to Stand: Min assist         General transfer comment: cues for hand placement each attempt, assist to rise and steady from toilet and chair heights to RW  Ambulation/Gait Ambulation/Gait assistance: Min assist Gait Distance (Feet): 75  Feet Assistive device: Rolling walker (2 wheeled) Gait Pattern/deviations: Step-through pattern;Decreased stride length Gait velocity: grossly <0.4 m/s   General Gait Details: pt needs manual assist to manage RW frequently, especially around obstacles around room; has trouble keeping body within RW at times; cues for upright posture needed   Stairs             Wheelchair Mobility    Modified Rankin (Stroke Patients Only)       Balance Overall balance assessment: Needs assistance   Sitting balance-Leahy Scale: Fair       Standing balance-Leahy Scale: Poor Standing balance comment: reliant on RW and external support, able to stand at sink with forearms resting on counter                            Cognition Arousal/Alertness: Awake/alert Behavior During Therapy: WFL for tasks assessed/performed Overall Cognitive Status: Within Functional Limits for tasks assessed                                        Exercises General Exercises - Lower Extremity Ankle Circles/Pumps: AROM;Strengthening;Both;10 reps;Seated Long Arc Quad: AROM;Strengthening;Both;10 reps;Seated Hip Flexion/Marching: AROM;Strengthening;Both;10 reps;Seated    General Comments General comments (skin integrity, edema, etc.): reviewed RLE elevation in chair/bed postures, use of call bell, HEP      Pertinent Vitals/Pain Pain Assessment: Faces Faces Pain Scale:  Hurts a little bit Pain Location: rt knee, increased with LAQ exercise Pain Descriptors / Indicators: Aching;Sore Pain Intervention(s): Monitored during session;Repositioned (ace wrap to R knee by RN)    Home Living                      Prior Function            PT Goals (current goals can now be found in the care plan section) Acute Rehab PT Goals Patient Stated Goal: get stronger and return home PT Goal Formulation: With patient Time For Goal Achievement: 12/24/20 Potential to Achieve Goals:  Good Progress towards PT goals: Progressing toward goals    Frequency    Min 3X/week      PT Plan Current plan remains appropriate    Co-evaluation              AM-PAC PT "6 Clicks" Mobility   Outcome Measure  Help needed turning from your back to your side while in a flat bed without using bedrails?: A Little Help needed moving from lying on your back to sitting on the side of a flat bed without using bedrails?: A Little Help needed moving to and from a bed to a chair (including a wheelchair)?: A Little Help needed standing up from a chair using your arms (e.g., wheelchair or bedside chair)?: A Little Help needed to walk in hospital room?: A Little Help needed climbing 3-5 steps with a railing? : A Lot 6 Click Score: 17    End of Session Equipment Utilized During Treatment: Gait belt Activity Tolerance: Patient tolerated treatment well Patient left: in chair;with call bell/phone within reach;with chair alarm set Nurse Communication: Mobility status PT Visit Diagnosis: History of falling (Z91.81)     Time: 1610-9604 PT Time Calculation (min) (ACUTE ONLY): 33 min  Charges:  $Gait Training: 8-22 mins $Therapeutic Activity: 8-22 mins                     Tiquan Bouch P., PTA Acute Rehabilitation Services Pager: [phone] Office: [phone]   Angus Palms 12/11/2020, 3:39 PM

## 2020-12-11 NOTE — Progress Notes (Signed)
PROGRESS NOTE  Dana [name] [id number] DOB: [birth year] DOA: 12/09/2020  PCP: Unk Pinto, MD  Brief History/Interval Summary: 80 y.o. female with medical history significant of hypertension, hyperlipidemia, hypothyroidism, rheumatoid arthritis, essential tremor presented to ED via EMS after she fell twice at home.  Family found the patient lying on the floor.   She has had multiple falls over the last several days.  Also some mention of lightheadedness and dizziness.  No injuries noted on imaging studies.  She was found to be positive for COVID-19.  Chest x-ray however did not show any active disease.  Reason for Visit: Frequent falls secondary to physical deconditioning  Consultants: None  Procedures: None yet  Antibiotics: Anti-infectives (From admission, onward)   Start     Dose/Rate Route Frequency Ordered Stop   12/11/20 1000  remdesivir 100 mg in sodium chloride 0.9 % 100 mL IVPB        100 mg 200 mL/hr over 30 Minutes Intravenous Daily 12/10/20 1129 12/11/20 1033   12/10/20 1215  cefTRIAXone (ROCEPHIN) 1 g in sodium chloride 0.9 % 100 mL IVPB        1 g 200 mL/hr over 30 Minutes Intravenous Every 24 hours 12/10/20 1129 12/13/20 1214   12/10/20 1000  remdesivir 100 mg in sodium chloride 0.9 % 100 mL IVPB  Status:  Discontinued        100 mg 200 mL/hr over 30 Minutes Intravenous Daily 12/09/20 1959 12/10/20 1129   12/09/20 2030  remdesivir 100 mg in sodium chloride 0.9 % 100 mL IVPB       "Followed by" Linked Group Details   100 mg 200 mL/hr over 30 Minutes Intravenous  Once 12/09/20 1959 12/09/20 2207   12/09/20 2000  remdesivir 100 mg in sodium chloride 0.9 % 100 mL IVPB       "Followed by" Linked Group Details   100 mg 200 mL/hr over 30 Minutes Intravenous  Once 12/09/20 1959 12/09/20 2123      Subjective/Interval History: Patient noted to be much more awake and alert this morning.  Complaining of hoarseness in her voice.  Denies any shortness of breath  or chest pain this morning.  Pain in the right knee is stable    Assessment/Plan:  Acute traumatic rhabdomyolysis CK level was elevated at 1716.  Apparently she was found on the floor by family members.  Possibly down for several hours.   Patient given IV fluids.  CK level improved to 1056 this morning.   Monitor urine output.  COVID-19 infection Noted to be incidentally positive.  Patient did not have any respiratory symptoms per se.  Inflammatory markers noted to be elevated.   She was given 3-day course of Remdesivir.  Holding off on steroids.  She continues to saturate normally on room air.    Recurrent falls/physical deconditioning Patient reported feeling dizzy prior to fall but denied any loss of consciousness.  CT of the head was unremarkable for any acute process.  PT and OT evaluation.  Patient noted to be on Valium at home which could increase her risk of falls.  Noted to be on diuretics at home as well.  Abnormal UA/possible UTI Patient not sure if she has dysuria or increased urinary frequency.  She does have significant WBC in her urine..  Urine cultures with multiple species.  We will give her a 3-day course of ceftriaxone.   Right knee pain/hemarthrosis Tricompartmental arthritis was noted on imaging studies.  Knee does appear to be  more swollen compared to the left.  CT scan was ordered of the right knee.  Moderate knee effusion was noted concerning for hemarthrosis.  Discussed with orthopedics Silvestre Gunner) .  No role for arthrocentesis as it will increase the risk of infection in the joint.  Recommends compression wrap and elevation of the lower extremity.  Weight-bear as tolerated.  Hypokalemia Likely due to diuretics.  Repleted.  Potassium normal this morning.  Magnesium was 2.1.    Essential hypertension Blood pressure is reasonably well controlled.  Hypothyroidism Continue levothyroxine.  History of rheumatoid arthritis Stable.  Macrocytic anemia Drop in  hemoglobin is likely dilutional.  No evidence for overt bleeding.  TSH was 4.85 in November.  We will check a B12 and folate levels.  Essential tremor Stable.  QT prolongation Replace electrolytes.     DVT Prophylaxis: SCDs Code Status: Full Code Family Communication: Discussed with patient.  Will update daughter later today. Disposition Plan: SNF recommended by PT and OT.  Status is: Inpatient  Remains inpatient appropriate because:Ongoing active pain requiring inpatient pain management, IV treatments appropriate due to intensity of illness or inability to take PO and Inpatient level of care appropriate due to severity of illness   Dispo: The patient is from: Home              Anticipated d/c is to: SNF              Anticipated d/c date is: 3 days              Patient currently is not medically stable to d/c.      Medications:  Scheduled: . dicyclomine  20 mg Oral TID AC  . levothyroxine  100 mcg Oral Q0600   Continuous: . cefTRIAXone (ROCEPHIN)  IV 200 mL/hr at 12/11/20 1202   XTK:[id number] **OR** acetaminophen   Objective:  Vital Signs  Vitals:   12/10/20 1500 12/10/20 1900 12/11/20 0500 12/11/20 0809  BP: 125/63 129/63 125/67 (!) 120/57  Pulse: 91 76 80 89  Resp: 16 19 17 18   Temp: 98.2 F (36.8 C) 98.2 F (36.8 C) 98 F (36.7 C) 98.2 F (36.8 C)  TempSrc: Oral Oral Oral Oral  SpO2: 100% 96% 98% 100%  Weight:      Height:        Intake/Output Summary (Last 24 hours) at 12/11/2020 1224 Last data filed at 12/11/2020 1202 Gross per 24 hour  Intake 3103.45 ml  Output 200 ml  Net 2903.45 ml   Filed Weights   12/09/20 1652  Weight: 60.3 kg    General appearance: Much more awake and alert this morning.  Essential tremors noted. Resp: Clear to auscultation bilaterally.  Normal effort Cardio: S1-S2 is normal regular.  No S3-S4.  No rubs murmurs or bruit GI: Abdomen is soft.  Nontender nondistended.  Bowel sounds are present normal.  No masses  organomegaly Extremities: Right leg covered in a wrap. Neurologic: Essential tremors.  No focal neurological deficits.     Lab Results:  Data Reviewed: I have personally reviewed following labs and imaging studies  CBC: Recent Labs  Lab 12/09/20 1713 12/11/20 0244  WBC 5.7 3.1*  NEUTROABS 3.8  --   HGB 11.0* 9.1*  HCT 33.9* 27.8*  MCV 104.3* 103.7*  PLT 192 268    Basic Metabolic Panel: Recent Labs  Lab 12/09/20 1713 12/10/20 0806 12/11/20 0244  NA 139  --  140  K 2.9*  --  4.1  CL 99  --  110  CO2 29  --  23  GLUCOSE 89  --  88  BUN 16  --  12  CREATININE 0.71  --  0.56  CALCIUM 8.6*  --  7.9*  MG  --  2.1  --     GFR: Estimated Creatinine Clearance: 45.6 mL/min (by C-G formula based on SCr of 0.56 mg/dL).  Liver Function Tests: Recent Labs  Lab 12/09/20 1713  AST 55*  ALT 22  ALKPHOS 70  BILITOT 0.9  PROT 5.6*  ALBUMIN 3.2*    Recent Labs  Lab 12/09/20 1713  LIPASE 26    Cardiac Enzymes: Recent Labs  Lab 12/09/20 1713 12/11/20 0244  CKTOTAL 1,716* 1,056*    Anemia Panel: Recent Labs    12/10/20 0806  FERRITIN 104    Recent Results (from the past 240 hour(s))  Resp Panel by RT-PCR (Flu A&B, Covid) Nasopharyngeal Swab     Status: Abnormal   Collection Time: 12/09/20  5:24 PM   Specimen: Nasopharyngeal Swab; Nasopharyngeal(NP) swabs in vial transport medium  Result Value Ref Range Status   SARS Coronavirus 2 by RT PCR POSITIVE (A) NEGATIVE Final    Comment: RESULT CALLED TO, READ BACK BY AND VERIFIED WITH: MARVA SIMMS RN @1825  12/09/2020 OLSONM (NOTE) SARS-CoV-2 target nucleic acids are DETECTED.  The SARS-CoV-2 RNA is generally detectable in upper respiratory specimens during the acute phase of infection. Positive results are indicative of the presence of the identified virus, but do not rule out bacterial infection or co-infection with other pathogens not detected by the test. Clinical correlation with patient history  and other diagnostic information is necessary to determine patient infection status. The expected result is Negative.  Fact Sheet for Patients: EntrepreneurPulse.com.au  Fact Sheet for Healthcare Providers: IncredibleEmployment.be  This test is not yet approved or cleared by the [place] FDA and  has been authorized for detection and/or diagnosis of SARS-CoV-2 by FDA under an Emergency Use Authorization (EUA).  This EUA will remain in effect (meaning this test c an be used) for the duration of  the COVID-19 declaration under Section 564(b)(1) of the Act, 21 U.S.C. section 360bbb-3(b)(1), unless the authorization is terminated or revoked sooner.     Influenza A by PCR NEGATIVE NEGATIVE Final   Influenza B by PCR NEGATIVE NEGATIVE Final    Comment: (NOTE) The Xpert Xpress SARS-CoV-2/FLU/RSV plus assay is intended as an aid in the diagnosis of influenza from Nasopharyngeal swab specimens and should not be used as a sole basis for treatment. Nasal washings and aspirates are unacceptable for Xpert Xpress SARS-CoV-2/FLU/RSV testing.  Fact Sheet for Patients: EntrepreneurPulse.com.au  Fact Sheet for Healthcare Providers: IncredibleEmployment.be  This test is not yet approved or cleared by the [place] FDA and has been authorized for detection and/or diagnosis of SARS-CoV-2 by FDA under an Emergency Use Authorization (EUA). This EUA will remain in effect (meaning this test can be used) for the duration of the COVID-19 declaration under Section 564(b)(1) of the Act, 21 U.S.C. section 360bbb-3(b)(1), unless the authorization is terminated or revoked.  Performed at [facility], [address] [place], [place] [address]   Urine culture     Status: Abnormal   Collection Time: 12/09/20 11:24 PM   Specimen: Urine, Random  Result Value Ref Range Status   Specimen Description URINE, RANDOM   Final   Special Requests   Final    NONE Performed at [facility], [place] [address], [place], [place]  [address]    Culture MULTIPLE SPECIES PRESENT, SUGGEST RECOLLECTION (A)  Final   Report Status 12/11/2020 FINAL  Final      Radiology Studies: DG Chest 2 View  Result Date: 12/09/2020 CLINICAL DATA:  Pain, fall EXAM: CHEST - 2 VIEW COMPARISON:  12/26/2018 FINDINGS: No focal opacity or pleural effusion. Stable cardiomediastinal silhouette with aortic atherosclerosis. No pneumothorax is seen. IMPRESSION: No active cardiopulmonary disease. Electronically Signed   By: Donavan Foil M.D.   On: 12/09/2020 19:00   DG Knee 2 Views Right  Result Date: 12/09/2020 CLINICAL DATA:  Fall with knee pain EXAM: RIGHT KNEE - 1-2 VIEW COMPARISON:  MRI 05/08/2020 FINDINGS: Linear sclerosis in the proximal tibia likely due to healing stress fracture. Mild tricompartment arthritis. Small knee effusion. IMPRESSION: Linear sclerosis in the proximal tibia likely due to healing stress fracture. Tricompartment arthritis with small knee effusion Electronically Signed   By: Donavan Foil M.D.   On: 12/09/2020 18:58   CT Head Wo Contrast  Result Date: 12/09/2020 CLINICAL DATA:  Head trauma, minor. Neck trauma. Additional history provided: Fall. EXAM: CT HEAD WITHOUT CONTRAST CT CERVICAL SPINE WITHOUT CONTRAST TECHNIQUE: Multidetector CT imaging of the head and cervical spine was performed following the standard protocol without intravenous contrast. Multiplanar CT image reconstructions of the cervical spine were also generated. COMPARISON:  Radiographs of the cervical spine 09/02/2009. FINDINGS: CT HEAD FINDINGS Brain: Mild cerebral atrophy. Mild ill-defined hypoattenuation within the cerebral white matter is nonspecific, but compatible with chronic small vessel ischemic disease. There is no acute intracranial hemorrhage. No demarcated cortical infarct. No extra-axial fluid collection. No evidence of intracranial mass.  No midline shift. Vascular: No hyperdense vessel.  Atherosclerotic calcifications. Skull: Normal. Negative for fracture or focal lesion. Sinuses/Orbits: Visualized orbits show no acute finding. Mild bilateral ethmoid, sphenoid and maxillary sinus mucosal thickening. Small right maxillary sinus mucous retention cyst. CT CERVICAL SPINE FINDINGS Alignment: Straightening of the expected cervical lordosis. Trace C4-C5 grade 1 retrolisthesis. Skull base and vertebrae: The basion-dental and atlanto-dental intervals are maintained.No evidence of acute fracture to the cervical spine. Soft tissues and spinal canal: No prevertebral fluid or swelling. No visible canal hematoma. Disc levels: Cervical spondylosis with multilevel disc space narrowing, disc bulges and uncovertebral hypertrophy. Disc space narrowing is advanced at C4-C5 and C5-C6. Upper chest: No consolidation within the imaged lung apices. No visible pneumothorax. IMPRESSION: CT head: 1. No evidence of acute intracranial abnormality. 2. Mild cerebral atrophy and chronic small vessel ischemic disease. 3. Mild paranasal sinus mucosal thickening. Small right maxillary sinus mucous retention cyst. CT cervical spine: 1. No evidence of acute fracture to the cervical spine 2. Minimal C4-C5 grade 1 retrolisthesis. 3. Cervical spondylosis as described. Electronically Signed   By: Kellie Simmering DO   On: 12/09/2020 18:21   CT Cervical Spine Wo Contrast  Result Date: 12/09/2020 CLINICAL DATA:  Head trauma, minor. Neck trauma. Additional history provided: Fall. EXAM: CT HEAD WITHOUT CONTRAST CT CERVICAL SPINE WITHOUT CONTRAST TECHNIQUE: Multidetector CT imaging of the head and cervical spine was performed following the standard protocol without intravenous contrast. Multiplanar CT image reconstructions of the cervical spine were also generated. COMPARISON:  Radiographs of the cervical spine 09/02/2009. FINDINGS: CT HEAD FINDINGS Brain: Mild cerebral atrophy. Mild ill-defined  hypoattenuation within the cerebral white matter is nonspecific, but compatible with chronic small vessel ischemic disease. There is no acute intracranial hemorrhage. No demarcated cortical infarct. No extra-axial fluid collection. No evidence of intracranial mass. No midline shift. Vascular: No hyperdense  vessel.  Atherosclerotic calcifications. Skull: Normal. Negative for fracture or focal lesion. Sinuses/Orbits: Visualized orbits show no acute finding. Mild bilateral ethmoid, sphenoid and maxillary sinus mucosal thickening. Small right maxillary sinus mucous retention cyst. CT CERVICAL SPINE FINDINGS Alignment: Straightening of the expected cervical lordosis. Trace C4-C5 grade 1 retrolisthesis. Skull base and vertebrae: The basion-dental and atlanto-dental intervals are maintained.No evidence of acute fracture to the cervical spine. Soft tissues and spinal canal: No prevertebral fluid or swelling. No visible canal hematoma. Disc levels: Cervical spondylosis with multilevel disc space narrowing, disc bulges and uncovertebral hypertrophy. Disc space narrowing is advanced at C4-C5 and C5-C6. Upper chest: No consolidation within the imaged lung apices. No visible pneumothorax. IMPRESSION: CT head: 1. No evidence of acute intracranial abnormality. 2. Mild cerebral atrophy and chronic small vessel ischemic disease. 3. Mild paranasal sinus mucosal thickening. Small right maxillary sinus mucous retention cyst. CT cervical spine: 1. No evidence of acute fracture to the cervical spine 2. Minimal C4-C5 grade 1 retrolisthesis. 3. Cervical spondylosis as described. Electronically Signed   By: Kellie Simmering DO   On: 12/09/2020 18:21   CT KNEE RIGHT WO CONTRAST  Result Date: 12/10/2020 CLINICAL DATA:  Fall 2 days ago, right lateral knee pain. EXAM: CT OF THE right KNEE WITHOUT CONTRAST TECHNIQUE: Multidetector CT imaging of the right knee was performed according to the standard protocol. Multiplanar CT image reconstructions  were also generated. COMPARISON:  MRI 05/08/2020 and radiograph from 12/09/2020 FINDINGS: Bones/Joint/Cartilage Sclerotic band medially in the proximal tibia corresponding to previously demonstrated tibial stress fracture. Complex layering moderate-sized knee effusion favoring hemarthrosis. No definite additional fracture is identified. Osteoarthritis noted with tricompartmental spurring. Ligaments Suboptimally assessed by CT. Muscles and Tendons Low-level edema tracks along the anterior margin of the short head of the biceps femoris muscle, for example on image 52 of series 4. Soft tissues Distal SFA atherosclerotic calcification. IMPRESSION: 1. Sclerotic band medially in the proximal tibia corresponding to known chronic tibial stress fracture. 2. Complex layering moderate knee effusion compatible with hemarthrosis. This was not present the prior MRI of on 05/08/2020, and along with the lateral-sided pain without specific lateral explanation, the possibility of CT occult injury is raised (consider MRI for further characterization). 3. Edema tracking along the short head of the biceps femoris muscle could be an indicator of muscle injury although no discontinuity is identified. 4. Atherosclerosis. 5. Mild to moderate osteoarthritis. Electronically Signed   By: Van Clines M.D.   On: 12/10/2020 10:34   DG Hip Unilat With Pelvis 2-3 Views Right  Result Date: 12/09/2020 CLINICAL DATA:  Fall with hip pain EXAM: DG HIP (WITH OR WITHOUT PELVIS) 2-3V RIGHT COMPARISON:  06/09/2010 FINDINGS: SI joints are non widened. Pubic symphysis and rami appear intact. Both femoral heads project in joint. No acute displaced fracture or malalignment is seen. IMPRESSION: No acute osseous abnormality. MRI follow-up may be pursued if continued clinical suspicion for acute osseous injury Electronically Signed   By: Donavan Foil M.D.   On: 12/09/2020 18:59       LOS: 2 days   [facility] Pager on  [email or web address]  12/11/2020, 12:24 PM

## 2020-12-11 NOTE — Social Work (Signed)
SW spoke to pt and pt's dtr re PT recommendation for SNF. Pt and dtr refuse SNF at this time and plan for pt to return home with [facility]. Pt's dtr lives next door to pt but plans to stay with her at dc. CM Levada Dy made aware of [place] needs. SW available if pt becomes agreeable to SNF.   Wandra Feinstein, MSW, LCSW [phone] (coverage)

## 2020-12-11 NOTE — Plan of Care (Signed)
?  Problem: Clinical Measurements: ?Goal: Ability to maintain clinical measurements within normal limits will improve ?Outcome: Progressing ?Goal: Will remain free from infection ?Outcome: Progressing ?Goal: Diagnostic test results will improve ?Outcome: Progressing ?  ?

## 2020-12-11 NOTE — Plan of Care (Signed)

## 2020-12-12 DIAGNOSIS — M25561 Pain in right knee: Secondary | ICD-10-CM | POA: Diagnosis not present

## 2020-12-12 DIAGNOSIS — T796XXA Traumatic ischemia of muscle, initial encounter: Secondary | ICD-10-CM | POA: Diagnosis not present

## 2020-12-12 DIAGNOSIS — U071 COVID-19: Secondary | ICD-10-CM | POA: Diagnosis not present

## 2020-12-12 LAB — CBC
HCT: 28.5 % — ABNORMAL LOW (ref 36.0–46.0)
Hemoglobin: 9 g/dL — ABNORMAL LOW (ref 12.0–15.0)
MCH: 33.2 pg (ref 26.0–34.0)
MCHC: 31.6 g/dL (ref 30.0–36.0)
MCV: 105.2 fL — ABNORMAL HIGH (ref 80.0–100.0)
Platelets: 159 10*3/uL (ref 150–400)
RBC: 2.71 MIL/uL — ABNORMAL LOW (ref 3.87–5.11)
RDW: 15.1 % (ref 11.5–15.5)
WBC: 3.9 10*3/uL — ABNORMAL LOW (ref 4.0–10.5)
nRBC: 0 % (ref 0.0–0.2)

## 2020-12-12 LAB — FERRITIN: Ferritin: 101 ng/mL (ref 11–307)

## 2020-12-12 LAB — COMPREHENSIVE METABOLIC PANEL
ALT: 28 U/L (ref 0–44)
AST: 57 U/L — ABNORMAL HIGH (ref 15–41)
Albumin: 2.4 g/dL — ABNORMAL LOW (ref 3.5–5.0)
Alkaline Phosphatase: 54 U/L (ref 38–126)
Anion gap: 9 (ref 5–15)
BUN: 10 mg/dL (ref 8–23)
CO2: 21 mmol/L — ABNORMAL LOW (ref 22–32)
Calcium: 7.9 mg/dL — ABNORMAL LOW (ref 8.9–10.3)
Chloride: 110 mmol/L (ref 98–111)
Creatinine, Ser: 0.59 mg/dL (ref 0.44–1.00)
GFR, Estimated: >60 mL/min (ref >60)
Glucose, Bld: 97 mg/dL (ref 70–99)
Potassium: 3.7 mmol/L (ref 3.5–5.1)
Sodium: 140 mmol/L (ref 135–145)
Total Bilirubin: 0.5 mg/dL (ref 0.3–1.2)
Total Protein: 4.2 g/dL — ABNORMAL LOW (ref 6.5–8.1)

## 2020-12-12 LAB — RETICULOCYTES
Immature Retic Fract: 6.6 % (ref 2.3–15.9)
RBC.: 2.7 MIL/uL — ABNORMAL LOW (ref 3.87–5.11)
Retic Count, Absolute: 17.3 10*3/uL — ABNORMAL LOW (ref 19.0–186.0)
Retic Ct Pct: 0.6 % (ref 0.4–3.1)

## 2020-12-12 LAB — FOLATE: Folate: 18.6 ng/mL (ref >5.9)

## 2020-12-12 LAB — IRON AND TIBC
Iron: 38 ug/dL (ref 28–170)
Saturation Ratios: 19 % (ref 10.4–31.8)
TIBC: 197 ug/dL — ABNORMAL LOW (ref 250–450)
UIBC: 159 ug/dL

## 2020-12-12 LAB — CK: Total CK: 706 U/L — ABNORMAL HIGH (ref 38–234)

## 2020-12-12 LAB — VITAMIN B12: Vitamin B-12: 933 pg/mL — ABNORMAL HIGH (ref 180–914)

## 2020-12-12 NOTE — Progress Notes (Signed)
PROGRESS NOTE  Dana [name] UEA:[id number] DOB: 28-Dec-1940 DOA: 12/09/2020  PCP: Lucky Cowboy, MD  Brief History/Interval Summary: 80 y.o. female with medical history significant of hypertension, hyperlipidemia, hypothyroidism, rheumatoid arthritis, essential tremor presented to ED via EMS after she fell twice at home.  Family found the patient lying on the floor.   She has had multiple falls over the last several days.  Also some mention of lightheadedness and dizziness.  No injuries noted on imaging studies.  She was found to be positive for COVID-19.  Chest x-ray however did not show any active disease.  Reason for Visit: Frequent falls secondary to physical deconditioning  Consultants: None  Procedures: None yet  Antibiotics: Anti-infectives (From admission, onward)   Start     Dose/Rate Route Frequency Ordered Stop   12/14/20 1000  hydroxychloroquine (PLAQUENIL) tablet 200 mg        200 mg Oral 2 times daily 12/11/20 1812     12/11/20 1400  hydroxychloroquine (PLAQUENIL) tablet 200 mg        200 mg Oral 2 times daily 12/11/20 1307 12/11/20 2248   12/11/20 1000  remdesivir 100 mg in sodium chloride 0.9 % 100 mL IVPB        100 mg 200 mL/hr over 30 Minutes Intravenous Daily 12/10/20 1129 12/11/20 1033   12/10/20 1215  cefTRIAXone (ROCEPHIN) 1 g in sodium chloride 0.9 % 100 mL IVPB        1 g 200 mL/hr over 30 Minutes Intravenous Every 24 hours 12/10/20 1129 12/12/20 1246   12/10/20 1000  remdesivir 100 mg in sodium chloride 0.9 % 100 mL IVPB  Status:  Discontinued        100 mg 200 mL/hr over 30 Minutes Intravenous Daily 12/09/20 1959 12/10/20 1129   12/09/20 2030  remdesivir 100 mg in sodium chloride 0.9 % 100 mL IVPB       "Followed by" Linked Group Details   100 mg 200 mL/hr over 30 Minutes Intravenous  Once 12/09/20 1959 12/09/20 2207   12/09/20 2000  remdesivir 100 mg in sodium chloride 0.9 % 100 mL IVPB       "Followed by" Linked Group Details   100 mg 200  mL/hr over 30 Minutes Intravenous  Once 12/09/20 1959 12/09/20 2123      Subjective/Interval History: States that she is feeling better. She has regained her voice. Denies any shortness of breath or chest pain. Had to wake up 3 times a night to urinate. Denies any dysuria.    Assessment/Plan:  Acute traumatic rhabdomyolysis CK level was elevated at 1716.  Apparently she was found on the floor by family members.  Possibly down for several hours. Given IV fluids. CK level has improved to 706 this morning. Will cut back on IV fluids.  COVID-19 infection Noted to be incidentally positive.  Patient did not have any respiratory symptoms per se.  Inflammatory markers noted to be elevated.   She was given 3-day course of Remdesivir. Does not have any oxygen requirements. No respiratory symptoms per se.  Recurrent falls/physical deconditioning Patient reported feeling dizzy prior to fall but denied any loss of consciousness.  CT of the head was unremarkable for any acute process.  Patient noted to be on Valium at home which could increase her risk of falls.  Noted to be on diuretics at home as well. Seen by PT and OT who recommended SNF but patient and daughter leaning towards going home with home health.  Abnormal  UA/possible UTI Patient not sure if she has dysuria or increased urinary frequency.  She does have significant WBC in her urine..  Urine cultures with multiple species. Treated with 3 days of ceftriaxone.  Right knee pain/hemarthrosis Tricompartmental arthritis was noted on imaging studies.  Knee was more swollen compared to the left.  CT scan was ordered of the right knee.  Moderate knee effusion was noted concerning for hemarthrosis.  Discussed with orthopedics Charma Igo) .  No role for arthrocentesis as it will increase the risk of infection in the joint.  Recommends compression wrap and elevation of the lower extremity.  Weight-bear as tolerated.  Hypokalemia Likely due to  diuretics.  Repleted.   Essential hypertension Blood pressure is reasonably well controlled.  Hypothyroidism Continue levothyroxine.  History of rheumatoid arthritis Stable.  Macrocytic anemia Drop in hemoglobin is likely dilutional.  No evidence for overt bleeding. Hemoglobin is stable this morning. TSH was 4.85 in November. Anemia panel reviewed. No evidence for any clear-cut deficiencies.  Essential tremor Stable.  QT prolongation Electrolytes repleted. EKG was repeated and shows improvement in QT interval.   DVT Prophylaxis: SCDs Code Status: Full Code Family Communication: Daughter being updated daily Disposition Plan: SNF recommended by PT and OT. Patient wants to go home with home health.  Status is: Inpatient  Remains inpatient appropriate because:Ongoing active pain requiring inpatient pain management, IV treatments appropriate due to intensity of illness or inability to take PO and Inpatient level of care appropriate due to severity of illness   Dispo: The patient is from: Home              Anticipated d/c is to: SNF              Anticipated d/c date is: 3 days              Patient currently is not medically stable to d/c.      Medications:  Scheduled: . dicyclomine  20 mg Oral TID AC  . [START ON 12/14/2020] hydroxychloroquine  200 mg Oral BID  . levothyroxine  100 mcg Oral Q0600   Continuous: . sodium chloride 50 mL/hr at 12/12/20 0731   QVZ:DGLOVFIEPPIRJ **OR** acetaminophen, loperamide   Objective:  Vital Signs  Vitals:   12/11/20 1632 12/11/20 2100 12/12/20 0600 12/12/20 0844  BP: 139/74 (!) 135/50 (!) 123/58 (!) 125/58  Pulse: 99 98 74 89  Resp: 16 20 18 16   Temp: 97.9 F (36.6 C) 98.8 F (37.1 C) 98.3 F (36.8 C) 98.4 F (36.9 C)  TempSrc: Oral Oral Oral Oral  SpO2: 99% 98% 98% 97%  Weight:      Height:        Intake/Output Summary (Last 24 hours) at 12/12/2020 1251 Last data filed at 12/12/2020 0731 Gross per 24 hour  Intake  1023.43 ml  Output --  Net 1023.43 ml   Filed Weights   12/09/20 1652  Weight: 60.3 kg    General appearance: Awake alert.  In no distress Resp: Clear to auscultation bilaterally.  Normal effort Cardio: S1-S2 is normal regular.  No S3-S4.  No rubs murmurs or bruit GI: Abdomen is soft.  Nontender nondistended.  Bowel sounds are present normal.  No masses organomegaly Essential tremors noted. No focal neurological deficits.     Lab Results:  Data Reviewed: I have personally reviewed following labs and imaging studies  CBC: Recent Labs  Lab 12/09/20 1713 12/11/20 0244 12/12/20 0246  WBC 5.7 3.1* 3.9*  NEUTROABS 3.8  --   --  HGB 11.0* 9.1* 9.0*  HCT 33.9* 27.8* 28.5*  MCV 104.3* 103.7* 105.2*  PLT 192 158 159    Basic Metabolic Panel: Recent Labs  Lab 12/09/20 1713 12/10/20 0806 12/11/20 0244 12/12/20 0246  NA 139  --  140 140  K 2.9*  --  4.1 3.7  CL 99  --  110 110  CO2 29  --  23 21*  GLUCOSE 89  --  88 97  BUN 16  --  12 10  CREATININE 0.71  --  0.56 0.59  CALCIUM 8.6*  --  7.9* 7.9*  MG  --  2.1  --   --     GFR: Estimated Creatinine Clearance: 45.6 mL/min (by C-G formula based on SCr of 0.59 mg/dL).  Liver Function Tests: Recent Labs  Lab 12/09/20 1713 12/12/20 0246  AST 55* 57*  ALT 22 28  ALKPHOS 70 54  BILITOT 0.9 0.5  PROT 5.6* 4.2*  ALBUMIN 3.2* 2.4*    Recent Labs  Lab 12/09/20 1713  LIPASE 26    Cardiac Enzymes: Recent Labs  Lab 12/09/20 1713 12/11/20 0244 12/12/20 0246  CKTOTAL 1,716* 1,056* 706*    Anemia Panel: Recent Labs    12/10/20 0806 12/12/20 0246  VITAMINB12  --  933*  FOLATE  --  18.6  FERRITIN 104 101  TIBC  --  197*  IRON  --  38  RETICCTPCT  --  0.6    Recent Results (from the past 240 hour(s))  Resp Panel by RT-PCR (Flu A&B, Covid) Nasopharyngeal Swab     Status: Abnormal   Collection Time: 12/09/20  5:24 PM   Specimen: Nasopharyngeal Swab; Nasopharyngeal(NP) swabs in vial transport medium   Result Value Ref Range Status   SARS Coronavirus 2 by RT PCR POSITIVE (A) NEGATIVE Final    Comment: RESULT CALLED TO, READ BACK BY AND VERIFIED WITH: MARVA SIMMS RN @1825  12/09/2020 OLSONM (NOTE) SARS-CoV-2 target nucleic acids are DETECTED.  The SARS-CoV-2 RNA is generally detectable in upper respiratory specimens during the acute phase of infection. Positive results are indicative of the presence of the identified virus, but do not rule out bacterial infection or co-infection with other pathogens not detected by the test. Clinical correlation with patient history and other diagnostic information is necessary to determine patient infection status. The expected result is Negative.  Fact Sheet for Patients: BloggerCourse.com  Fact Sheet for Healthcare Providers: SeriousBroker.it  This test is not yet approved or cleared by the [place] FDA and  has been authorized for detection and/or diagnosis of SARS-CoV-2 by FDA under an Emergency Use Authorization (EUA).  This EUA will remain in effect (meaning this test c an be used) for the duration of  the COVID-19 declaration under Section 564(b)(1) of the Act, 21 U.S.C. section 360bbb-3(b)(1), unless the authorization is terminated or revoked sooner.     Influenza A by PCR NEGATIVE NEGATIVE Final   Influenza B by PCR NEGATIVE NEGATIVE Final    Comment: (NOTE) The Xpert Xpress SARS-CoV-2/FLU/RSV plus assay is intended as an aid in the diagnosis of influenza from Nasopharyngeal swab specimens and should not be used as a sole basis for treatment. Nasal washings and aspirates are unacceptable for Xpert Xpress SARS-CoV-2/FLU/RSV testing.  Fact Sheet for Patients: BloggerCourse.com  Fact Sheet for Healthcare Providers: SeriousBroker.it  This test is not yet approved or cleared by the [place] FDA and has been authorized for  detection and/or diagnosis of SARS-CoV-2 by FDA under an Emergency Use  Authorization (EUA). This EUA will remain in effect (meaning this test can be used) for the duration of the COVID-19 declaration under Section 564(b)(1) of the Act, 21 U.S.C. section 360bbb-3(b)(1), unless the authorization is terminated or revoked.  Performed at [facility], [address] [place], [place] [address]   Urine culture     Status: Abnormal   Collection Time: 12/09/20 11:24 PM   Specimen: Urine, Random  Result Value Ref Range Status   Specimen Description URINE, RANDOM  Final   Special Requests   Final    NONE Performed at [facility], [address], [place], [place] [address]    Culture MULTIPLE SPECIES PRESENT, SUGGEST RECOLLECTION (A)  Final   Report Status 12/11/2020 FINAL  Final      Radiology Studies: No results found.     LOS: 3 days   Timtohy Broski [employer] on [email or web address]  12/12/2020, 12:51 PM

## 2020-12-12 NOTE — Plan of Care (Signed)
  Problem: Clinical Measurements: Goal: Will remain free from infection Outcome: Progressing Goal: Diagnostic test results will improve Outcome: Progressing Goal: Cardiovascular complication will be avoided Outcome: Progressing   

## 2020-12-12 NOTE — Plan of Care (Signed)

## 2020-12-13 DIAGNOSIS — T796XXA Traumatic ischemia of muscle, initial encounter: Secondary | ICD-10-CM | POA: Diagnosis not present

## 2020-12-13 LAB — COMPREHENSIVE METABOLIC PANEL
ALT: 28 U/L (ref 0–44)
AST: 48 U/L — ABNORMAL HIGH (ref 15–41)
Albumin: 2.4 g/dL — ABNORMAL LOW (ref 3.5–5.0)
Alkaline Phosphatase: 55 U/L (ref 38–126)
Anion gap: 8 (ref 5–15)
BUN: 11 mg/dL (ref 8–23)
CO2: 23 mmol/L (ref 22–32)
Calcium: 8.3 mg/dL — ABNORMAL LOW (ref 8.9–10.3)
Chloride: 111 mmol/L (ref 98–111)
Creatinine, Ser: 0.62 mg/dL (ref 0.44–1.00)
GFR, Estimated: >60 mL/min (ref >60)
Glucose, Bld: 102 mg/dL — ABNORMAL HIGH (ref 70–99)
Potassium: 3.8 mmol/L (ref 3.5–5.1)
Sodium: 142 mmol/L (ref 135–145)
Total Bilirubin: 0.7 mg/dL (ref 0.3–1.2)
Total Protein: 4.5 g/dL — ABNORMAL LOW (ref 6.5–8.1)

## 2020-12-13 LAB — CBC
HCT: 27.4 % — ABNORMAL LOW (ref 36.0–46.0)
Hemoglobin: 9 g/dL — ABNORMAL LOW (ref 12.0–15.0)
MCH: 33.8 pg (ref 26.0–34.0)
MCHC: 32.8 g/dL (ref 30.0–36.0)
MCV: 103 fL — ABNORMAL HIGH (ref 80.0–100.0)
Platelets: 159 10*3/uL (ref 150–400)
RBC: 2.66 MIL/uL — ABNORMAL LOW (ref 3.87–5.11)
RDW: 15.1 % (ref 11.5–15.5)
WBC: 3 10*3/uL — ABNORMAL LOW (ref 4.0–10.5)
nRBC: 0 % (ref 0.0–0.2)

## 2020-12-13 MED ORDER — POLYETHYLENE GLYCOL 3350 17 G PO PACK
17.0000 g | PACK | Freq: Every day | ORAL | Status: DC
  Filled 2020-12-13: qty 1

## 2020-12-13 MED ORDER — POLYETHYLENE GLYCOL 3350 17 G PO PACK: 17.0000 g | PACK | Freq: Every day | ORAL | 0 refills | Status: AC

## 2020-12-13 NOTE — Discharge Instructions (Signed)

## 2020-12-13 NOTE — Plan of Care (Signed)

## 2020-12-13 NOTE — Discharge Summary (Signed)
[facility]  Physician Discharge Summary   Patient ID: Dana [name] MRN: [id number] DOB/AGE: [birth year] 80 y.o.  Admit date: 12/09/2020 Discharge date: 12/13/2020  PCP: Unk Pinto, MD  DISCHARGE DIAGNOSES:  Acute traumatic rhabdomyolysis, resolved COVID-19 infection Physical deconditioning Urinary tract infection Right knee hemarthrosis secondary to fall Essential hypertension Rheumatoid arthritis Hypothyroidism Macrocytic anemia History of essential tremors  RECOMMENDATIONS FOR OUTPATIENT FOLLOW UP: 1. Follow-up with primary care provider and orthopedics 2. Home health    Home Health: PT OT Equipment/Devices: Rolling walker  CODE STATUS: Full code  DISCHARGE CONDITION: fair  Diet recommendation: As before  INITIAL HISTORY: 80 y.o.femalewith medical history significant ofhypertension, hyperlipidemia, hypothyroidism, rheumatoid arthritis, essential tremor presented to ED via EMS after she fell twice at home. Family found the patient lying on the floor.  She has had multiple falls over the last several days.  Also some mention of lightheadedness and dizziness.  No injuries noted on imaging studies.  She was found to be positive for COVID-19.  Chest x-ray however did not show any active disease.   HOSPITAL COURSE:   Acute traumatic rhabdomyolysis CK level was elevated at 1716.  Apparently she was found on the floor by family members.  Possibly down for several hours. Given IV fluids. CK level improved to 706.  COVID-19 infection Noted to be incidentally positive.  Patient did not have any respiratory symptoms per se.  Inflammatory markers noted to be elevated.   She was given 3-day course of Remdesivir. Does not have any oxygen requirements. No respiratory symptoms per se.  Recurrent falls/physical deconditioning Patient reported feeling dizzy prior to fall but denied any loss of consciousness.  CT of the head was unremarkable for any  acute process.  Patient noted to be on Valium at home which could increase her risk of falls.  Noted to be on diuretics at home as well. Seen by PT and OT who recommended SNF but patient and daughter wanted to go home with home health.  Abnormal UA/possible UTI Patient not sure if she has dysuria or increased urinary frequency.  She does have significant WBC in her urine..  Urine cultures with multiple species. Treated with 3 days of ceftriaxone.  Right knee pain/hemarthrosis Tricompartmental arthritis was noted on imaging studies.  Knee was more swollen compared to the left.  CT scan was ordered of the right knee.  Moderate knee effusion was noted concerning for hemarthrosis.  Discussed with orthopedics Silvestre Gunner) .  No role for arthrocentesis as it will increase the risk of infection in the joint.  Recommends compression wrap and elevation of the lower extremity.  Weight-bear as tolerated. Patient to follow-up with her primary orthopedist, Dr. Rhona Raider.  Hypokalemia Likely due to diuretics.  Repleted.   Essential hypertension Blood pressure is reasonably well controlled.  Hypothyroidism Continue levothyroxine.  History of rheumatoid arthritis Stable.  Macrocytic anemia Drop in hemoglobin is likely dilutional.  No evidence for overt bleeding.  Hemoglobin has been stable. TSH was 4.85 in November. Anemia panel reviewed. No evidence for any clear-cut deficiencies.  Essential tremor Stable.  QT prolongation Electrolytes repleted. EKG was repeated and shows improvement in QT interval.  Overall stable.  Okay for discharge home today   PERTINENT LABS:  The results of significant diagnostics from this hospitalization (including imaging, microbiology, ancillary and laboratory) are listed below for reference.    Microbiology: Recent Results (from the past 240 hour(s))  Resp Panel by RT-PCR (Flu A&B, Covid) Nasopharyngeal Swab  Status: Abnormal   Collection Time:  12/09/20  5:24 PM   Specimen: Nasopharyngeal Swab; Nasopharyngeal(NP) swabs in vial transport medium  Result Value Ref Range Status   SARS Coronavirus 2 by RT PCR POSITIVE (A) NEGATIVE Final    Comment: RESULT CALLED TO, READ BACK BY AND VERIFIED WITH: MARVA SIMMS RN @1825  12/09/2020 OLSONM (NOTE) SARS-CoV-2 target nucleic acids are DETECTED.  The SARS-CoV-2 RNA is generally detectable in upper respiratory specimens during the acute phase of infection. Positive results are indicative of the presence of the identified virus, but do not rule out bacterial infection or co-infection with other pathogens not detected by the test. Clinical correlation with patient history and other diagnostic information is necessary to determine patient infection status. The expected result is Negative.  Fact Sheet for Patients: EntrepreneurPulse.com.au  Fact Sheet for Healthcare Providers: IncredibleEmployment.be  This test is not yet approved or cleared by the [place] FDA and  has been authorized for detection and/or diagnosis of SARS-CoV-2 by FDA under an Emergency Use Authorization (EUA).  This EUA will remain in effect (meaning this test c an be used) for the duration of  the COVID-19 declaration under Section 564(b)(1) of the Act, 21 U.S.C. section 360bbb-3(b)(1), unless the authorization is terminated or revoked sooner.     Influenza A by PCR NEGATIVE NEGATIVE Final   Influenza B by PCR NEGATIVE NEGATIVE Final    Comment: (NOTE) The Xpert Xpress SARS-CoV-2/FLU/RSV plus assay is intended as an aid in the diagnosis of influenza from Nasopharyngeal swab specimens and should not be used as a sole basis for treatment. Nasal washings and aspirates are unacceptable for Xpert Xpress SARS-CoV-2/FLU/RSV testing.  Fact Sheet for Patients: EntrepreneurPulse.com.au  Fact Sheet for Healthcare  Providers: IncredibleEmployment.be  This test is not yet approved or cleared by the [place] FDA and has been authorized for detection and/or diagnosis of SARS-CoV-2 by FDA under an Emergency Use Authorization (EUA). This EUA will remain in effect (meaning this test can be used) for the duration of the COVID-19 declaration under Section 564(b)(1) of the Act, 21 U.S.C. section 360bbb-3(b)(1), unless the authorization is terminated or revoked.  Performed at [facility], [address] [place], [place] [address]   Urine culture     Status: Abnormal   Collection Time: 12/09/20 11:24 PM   Specimen: Urine, Random  Result Value Ref Range Status   Specimen Description URINE, RANDOM  Final   Special Requests   Final    NONE Performed at [facility], [place] [address] [place], [place] [address]    Culture MULTIPLE SPECIES PRESENT, SUGGEST RECOLLECTION (A)  Final   Report Status 12/11/2020 FINAL  Final     Labs:  COVID-19 Labs  Recent Labs    12/12/20 0246  FERRITIN 101    Lab Results  Component Value Date   SARSCOV2NAA POSITIVE (A) 12/09/2020      Basic Metabolic Panel: Recent Labs  Lab 12/09/20 1713 12/10/20 0806 12/11/20 0244 12/12/20 0246 12/13/20 0251  NA 139  --  140 140 142  K 2.9*  --  4.1 3.7 3.8  CL 99  --  110 110 111  CO2 29  --  23 21* 23  GLUCOSE 89  --  88 97 102*  BUN 16  --  12 10 11   CREATININE 0.71  --  0.56 0.59 0.62  CALCIUM 8.6*  --  7.9* 7.9* 8.3*  MG  --  2.1  --   --   --  Liver Function Tests: Recent Labs  Lab 12/09/20 1713 12/12/20 0246 12/13/20 0251  AST 55* 57* 48*  ALT 22 28 28   ALKPHOS 70 54 55  BILITOT 0.9 0.5 0.7  PROT 5.6* 4.2* 4.5*  ALBUMIN 3.2* 2.4* 2.4*   Recent Labs  Lab 12/09/20 1713  LIPASE 26  CBC: Recent Labs  Lab 12/09/20 1713 12/11/20 0244 12/12/20 0246 12/13/20 0251  WBC 5.7 3.1* 3.9* 3.0*  NEUTROABS 3.8  --   --   --   HGB 11.0* 9.1* 9.0* 9.0*  HCT  33.9* 27.8* 28.5* 27.4*  MCV 104.3* 103.7* 105.2* 103.0*  PLT 192 158 159 159   Cardiac Enzymes: Recent Labs  Lab 12/09/20 1713 12/11/20 0244 12/12/20 0246  CKTOTAL 1,716* 1,056* 706*     IMAGING STUDIES DG Chest 2 View  Result Date: 12/09/2020 CLINICAL DATA:  Pain, fall EXAM: CHEST - 2 VIEW COMPARISON:  12/26/2018 FINDINGS: No focal opacity or pleural effusion. Stable cardiomediastinal silhouette with aortic atherosclerosis. No pneumothorax is seen. IMPRESSION: No active cardiopulmonary disease. Electronically Signed   By: Donavan Foil M.D.   On: 12/09/2020 19:00   DG Knee 2 Views Right  Result Date: 12/09/2020 CLINICAL DATA:  Fall with knee pain EXAM: RIGHT KNEE - 1-2 VIEW COMPARISON:  MRI 05/08/2020 FINDINGS: Linear sclerosis in the proximal tibia likely due to healing stress fracture. Mild tricompartment arthritis. Small knee effusion. IMPRESSION: Linear sclerosis in the proximal tibia likely due to healing stress fracture. Tricompartment arthritis with small knee effusion Electronically Signed   By: Donavan Foil M.D.   On: 12/09/2020 18:58   CT Head Wo Contrast  Result Date: 12/09/2020 CLINICAL DATA:  Head trauma, minor. Neck trauma. Additional history provided: Fall. EXAM: CT HEAD WITHOUT CONTRAST CT CERVICAL SPINE WITHOUT CONTRAST TECHNIQUE: Multidetector CT imaging of the head and cervical spine was performed following the standard protocol without intravenous contrast. Multiplanar CT image reconstructions of the cervical spine were also generated. COMPARISON:  Radiographs of the cervical spine 09/02/2009. FINDINGS: CT HEAD FINDINGS Brain: Mild cerebral atrophy. Mild ill-defined hypoattenuation within the cerebral white matter is nonspecific, but compatible with chronic small vessel ischemic disease. There is no acute intracranial hemorrhage. No demarcated cortical infarct. No extra-axial fluid collection. No evidence of intracranial mass. No midline shift. Vascular: No hyperdense  vessel.  Atherosclerotic calcifications. Skull: Normal. Negative for fracture or focal lesion. Sinuses/Orbits: Visualized orbits show no acute finding. Mild bilateral ethmoid, sphenoid and maxillary sinus mucosal thickening. Small right maxillary sinus mucous retention cyst. CT CERVICAL SPINE FINDINGS Alignment: Straightening of the expected cervical lordosis. Trace C4-C5 grade 1 retrolisthesis. Skull base and vertebrae: The basion-dental and atlanto-dental intervals are maintained.No evidence of acute fracture to the cervical spine. Soft tissues and spinal canal: No prevertebral fluid or swelling. No visible canal hematoma. Disc levels: Cervical spondylosis with multilevel disc space narrowing, disc bulges and uncovertebral hypertrophy. Disc space narrowing is advanced at C4-C5 and C5-C6. Upper chest: No consolidation within the imaged lung apices. No visible pneumothorax. IMPRESSION: CT head: 1. No evidence of acute intracranial abnormality. 2. Mild cerebral atrophy and chronic small vessel ischemic disease. 3. Mild paranasal sinus mucosal thickening. Small right maxillary sinus mucous retention cyst. CT cervical spine: 1. No evidence of acute fracture to the cervical spine 2. Minimal C4-C5 grade 1 retrolisthesis. 3. Cervical spondylosis as described. Electronically Signed   By: Kellie Simmering DO   On: 12/09/2020 18:21   CT Cervical Spine Wo Contrast  Result Date: 12/09/2020 CLINICAL DATA:  Head  trauma, minor. Neck trauma. Additional history provided: Fall. EXAM: CT HEAD WITHOUT CONTRAST CT CERVICAL SPINE WITHOUT CONTRAST TECHNIQUE: Multidetector CT imaging of the head and cervical spine was performed following the standard protocol without intravenous contrast. Multiplanar CT image reconstructions of the cervical spine were also generated. COMPARISON:  Radiographs of the cervical spine 09/02/2009. FINDINGS: CT HEAD FINDINGS Brain: Mild cerebral atrophy. Mild ill-defined hypoattenuation within the cerebral white  matter is nonspecific, but compatible with chronic small vessel ischemic disease. There is no acute intracranial hemorrhage. No demarcated cortical infarct. No extra-axial fluid collection. No evidence of intracranial mass. No midline shift. Vascular: No hyperdense vessel.  Atherosclerotic calcifications. Skull: Normal. Negative for fracture or focal lesion. Sinuses/Orbits: Visualized orbits show no acute finding. Mild bilateral ethmoid, sphenoid and maxillary sinus mucosal thickening. Small right maxillary sinus mucous retention cyst. CT CERVICAL SPINE FINDINGS Alignment: Straightening of the expected cervical lordosis. Trace C4-C5 grade 1 retrolisthesis. Skull base and vertebrae: The basion-dental and atlanto-dental intervals are maintained.No evidence of acute fracture to the cervical spine. Soft tissues and spinal canal: No prevertebral fluid or swelling. No visible canal hematoma. Disc levels: Cervical spondylosis with multilevel disc space narrowing, disc bulges and uncovertebral hypertrophy. Disc space narrowing is advanced at C4-C5 and C5-C6. Upper chest: No consolidation within the imaged lung apices. No visible pneumothorax. IMPRESSION: CT head: 1. No evidence of acute intracranial abnormality. 2. Mild cerebral atrophy and chronic small vessel ischemic disease. 3. Mild paranasal sinus mucosal thickening. Small right maxillary sinus mucous retention cyst. CT cervical spine: 1. No evidence of acute fracture to the cervical spine 2. Minimal C4-C5 grade 1 retrolisthesis. 3. Cervical spondylosis as described. Electronically Signed   By: Kellie Simmering DO   On: 12/09/2020 18:21   CT KNEE RIGHT WO CONTRAST  Result Date: 12/10/2020 CLINICAL DATA:  Fall 2 days ago, right lateral knee pain. EXAM: CT OF THE right KNEE WITHOUT CONTRAST TECHNIQUE: Multidetector CT imaging of the right knee was performed according to the standard protocol. Multiplanar CT image reconstructions were also generated. COMPARISON:  MRI  05/08/2020 and radiograph from 12/09/2020 FINDINGS: Bones/Joint/Cartilage Sclerotic band medially in the proximal tibia corresponding to previously demonstrated tibial stress fracture. Complex layering moderate-sized knee effusion favoring hemarthrosis. No definite additional fracture is identified. Osteoarthritis noted with tricompartmental spurring. Ligaments Suboptimally assessed by CT. Muscles and Tendons Low-level edema tracks along the anterior margin of the short head of the biceps femoris muscle, for example on image 52 of series 4. Soft tissues Distal SFA atherosclerotic calcification. IMPRESSION: 1. Sclerotic band medially in the proximal tibia corresponding to known chronic tibial stress fracture. 2. Complex layering moderate knee effusion compatible with hemarthrosis. This was not present the prior MRI of on 05/08/2020, and along with the lateral-sided pain without specific lateral explanation, the possibility of CT occult injury is raised (consider MRI for further characterization). 3. Edema tracking along the short head of the biceps femoris muscle could be an indicator of muscle injury although no discontinuity is identified. 4. Atherosclerosis. 5. Mild to moderate osteoarthritis. Electronically Signed   By: Van Clines M.D.   On: 12/10/2020 10:34   DG Hip Unilat With Pelvis 2-3 Views Right  Result Date: 12/09/2020 CLINICAL DATA:  Fall with hip pain EXAM: DG HIP (WITH OR WITHOUT PELVIS) 2-3V RIGHT COMPARISON:  06/09/2010 FINDINGS: SI joints are non widened. Pubic symphysis and rami appear intact. Both femoral heads project in joint. No acute displaced fracture or malalignment is seen. IMPRESSION: No acute osseous abnormality. MRI follow-up  may be pursued if continued clinical suspicion for acute osseous injury Electronically Signed   By: Donavan Foil M.D.   On: 12/09/2020 18:59    DISCHARGE EXAMINATION: Vitals:   12/12/20 1325 12/12/20 2012 12/13/20 0547 12/13/20 0803  BP: 118/86  111/60 (!) 110/50 129/64  Pulse: 91 70 75 83  Resp: 18 16 18 17   Temp: 98.5 F (36.9 C) 98.5 F (36.9 C) 98.6 F (37 C) 98.1 F (36.7 C)  TempSrc: Oral Oral Axillary Oral  SpO2: 99% 100% 97% 99%  Weight:      Height:       General appearance: Awake alert.  In no distress Resp: Clear to auscultation bilaterally.  Normal effort Cardio: S1-S2 is normal regular.  No S3-S4.  No rubs murmurs or bruit GI: Abdomen is soft.  Nontender nondistended.  Bowel sounds are present normal.  No masses organomegaly    DISPOSITION: Home with home health  Discharge Instructions    Call MD for:  difficulty breathing, headache or visual disturbances   Complete by: As directed    Call MD for:  extreme fatigue   Complete by: As directed    Call MD for:  persistant dizziness or light-headedness   Complete by: As directed    Call MD for:  persistant nausea and vomiting   Complete by: As directed    Call MD for:  severe uncontrolled pain   Complete by: As directed    Call MD for:  temperature >100.4   Complete by: As directed    Diet - low sodium heart healthy   Complete by: As directed    Discharge instructions   Complete by: As directed    You will need to stay isolated of 1/26. Please be sure to follow-up with your orthopedic surgeon, Dr. Rhona Raider for issues with your right knee. Also follow-up with your primary care provider in 1 to 2 weeks.  You were cared for by a hospitalist during your hospital stay. If you have any questions about your discharge medications or the care you received while you were in the hospital after you are discharged, you can call the unit and asked to speak with the hospitalist on call if the hospitalist that took care of you is not available. Once you are discharged, your primary care physician will handle any further medical issues. Please note that NO REFILLS for any discharge medications will be authorized once you are discharged, as it is imperative that you return  to your primary care physician (or establish a relationship with a primary care physician if you do not have one) for your aftercare needs so that they can reassess your need for medications and monitor your lab values. If you do not have a primary care physician, you can call [phone] for a physician referral.   Increase activity slowly   Complete by: As directed          Allergies as of 12/13/2020      Reactions   Codeine Nausea Only   Mysoline [primidone] Nausea Only, Other (See Comments)   Over-sedated      Medication List    STOP taking these medications   diazepam 5 MG tablet Commonly known as: VALIUM     TAKE these medications   acetaminophen 500 MG tablet Commonly known as: TYLENOL Take 1,000 mg by mouth at bedtime.   dicyclomine 20 MG tablet Commonly known as: BENTYL Take      1 tablet  3 x /day       before Meals        for Cramping, Bloating , Nausea or Diarrhea What changed:   how much to take  how to take this  when to take this  reasons to take this  additional instructions   folic acid 400 MCG tablet Commonly known as: FOLVITE Take 1 tablet daily. What changed:   how much to take  how to take this  when to take this  additional instructions   furosemide 40 MG tablet Commonly known as: LASIX TAKE 1 TABLET 2  TIMES DAILY FOR FLUID  RETENTION AND ANKLE  SWELLING What changed:   how much to take  how to take this  when to take this  additional instructions   hydroxychloroquine 200 MG tablet Commonly known as: PLAQUENIL Take 200 mg by mouth See admin instructions. Take one tablet (200 mcg) by mouth twice daily Monday thru Friday (skip Saturday and Sunday)   levothyroxine 100 MCG tablet Commonly known as: SYNTHROID Take      1 tablet       Daily       on an empty stomach with only water for 30 minutes & no Antacid meds, Calcium or Magnesium for 4 hours & avoid Biotin What changed:   how much to take  how to take this  when  to take this  additional instructions   methotrexate 2.5 MG tablet Commonly known as: RHEUMATREX Take 12.5 mg by mouth See admin instructions. Take 5 tablets (12.5 mg) by mouth twice weekly - Sunday and Wednesday   montelukast 10 MG tablet Commonly known as: SINGULAIR Take     1 tablet     Daily      for Allergies What changed:   how much to take  how to take this  when to take this  additional instructions   multivitamin with minerals Tabs tablet Take 1 tablet by mouth every evening.   ondansetron 8 MG disintegrating tablet Commonly known as: Zofran ODT 8mg ODT q4 hours prn nausea What changed:   how much to take  how to take this  when to take this  reasons to take this  additional instructions   polyethylene glycol 17 g packet Commonly known as: MIRALAX / GLYCOLAX Take 17 g by mouth daily. Start taking on: December 14, 2020   propranolol 80 MG tablet Commonly known as: INDERAL TAKE 1 TABLET BY MOUTH 3  TIMES DAILY What changed:   when to take this  additional instructions   VITAMIN B 12 PO Place 1 tablet under the tongue every evening.   Vitamin D 125 MCG (5000 UT) Caps Take 10,000 Units by mouth every evening.            Durable Medical Equipment  (From admission, onward)         Start     Ordered   12/13/20 1117  For home use only DME 3 n 1  Once        01 /23/22 1116            Follow-up Information    Care, [facility] Follow up.   Specialty: Home Health Services Why: home health services arraged with [place] information: [address] [place] [address] [phone]        Unk Pinto, MD. Schedule an appointment as soon as possible for a visit in 1 week(s).   Specialty: Internal Medicine Contact  information: [address] [place] [address] [phone]        Melrose Nakayama, MD. Schedule an appointment as soon as possible for a visit in 1 week(s).    Specialty: Orthopedic Surgery Why: for knee swelling Contact information: [place] [address] [phone]               TOTAL DISCHARGE TIME: 57 minutes  [facility] Pager on [email or web address]  12/13/2020, 1:38 PM

## 2020-12-13 NOTE — TOC Transition Note (Addendum)
Transition of Care [facility]) - CM/SW Discharge Note   Patient Details  Name: Ryver Zadrozny MRN: [id number] Date of Birth: 31-May-1941  Transition of Care [facility]) CM/SW Contact:  Konrad Penta, RN Phone Number: [phone] 12/13/2020, 12:23 PM   Clinical Narrative:    Spoke with Mrs. Monforte to confirm transitions plans are to return home with home health. Daughter to assist at home. Son to pick her up from hospital today. Mrs. Ventura reports she has a walker at home. Floor nurse states she will provide DME 3in1.  Confirmed with Tommi Rumps with [facility] that patient will transition to home today 12/13/20. Home health previously arranged by weekday RNCM.   No further needs assessed.     Final next level of care: [place] Barriers to Discharge: No Barriers Identified   Patient Goals and CMS Choice Patient states their goals for this hospitalization and ongoing recovery are:: return home   Choice offered to / list presented to : Adult Children  Discharge Placement                       Discharge Plan and Services   Discharge Planning Services: CM Consult            DME Arranged: 3-N-1 (nursing floor will provide per nursing)         HH Arranged: PT,OT [place]: [place] Date [facility] Agency Contacted: 12/13/20 Time [place]: 1215 Representative spoke with at [place]: [place] ([place]) Interventions     Readmission Risk Interventions No flowsheet data found.

## 2020-12-13 NOTE — Progress Notes (Signed)
Provided discharge education/instructions, all questions and concerns addressed, Pt not in distress. BSC delivered to room, discharged home accompanied by his son.

## 2020-12-14 ENCOUNTER — Other Ambulatory Visit: Payer: Self-pay

## 2020-12-14 NOTE — Patient Outreach (Signed)
Dana [name]) Care Management  12/14/2020  Dana [name] 05-09-1941 [id number]   Referral Date: 12/14/20 Referral Source: [employer] Report Date of Discharge: 12/13/20 [facility]:  [facility] Insurance: [name] [place] [facility]   Referral received.  No outreach warranted at this time.  Transition of Care calls being completed via EMMI. RN CM will outreach patient for any red flags received.    Plan: RN CM will close case.    Jone Baseman, RN, MSN [name] Care Management Care Management Coordinator Direct Line [phone] Toll Free: [phone]  Fax: [phone]

## 2020-12-15 ENCOUNTER — Telehealth: Payer: Self-pay | Admitting: *Deleted

## 2020-12-15 NOTE — Telephone Encounter (Signed)
I left a message for the patient to return my call.

## 2020-12-16 ENCOUNTER — Telehealth: Payer: Self-pay | Admitting: *Deleted

## 2020-12-16 NOTE — Telephone Encounter (Signed)
I left a message for the patient to return my call.

## 2020-12-17 ENCOUNTER — Telehealth: Payer: Self-pay | Admitting: *Deleted

## 2020-12-17 DIAGNOSIS — M4312 Spondylolisthesis, cervical region: Secondary | ICD-10-CM | POA: Diagnosis not present

## 2020-12-17 DIAGNOSIS — M1711 Unilateral primary osteoarthritis, right knee: Secondary | ICD-10-CM | POA: Diagnosis not present

## 2020-12-17 DIAGNOSIS — U071 COVID-19: Secondary | ICD-10-CM | POA: Diagnosis not present

## 2020-12-17 DIAGNOSIS — M069 Rheumatoid arthritis, unspecified: Secondary | ICD-10-CM | POA: Diagnosis not present

## 2020-12-17 DIAGNOSIS — T796XXD Traumatic ischemia of muscle, subsequent encounter: Secondary | ICD-10-CM | POA: Diagnosis not present

## 2020-12-17 DIAGNOSIS — I10 Essential (primary) hypertension: Secondary | ICD-10-CM | POA: Diagnosis not present

## 2020-12-17 DIAGNOSIS — M4802 Spinal stenosis, cervical region: Secondary | ICD-10-CM | POA: Diagnosis not present

## 2020-12-17 DIAGNOSIS — M5021 Other cervical disc displacement,  high cervical region: Secondary | ICD-10-CM | POA: Diagnosis not present

## 2020-12-17 DIAGNOSIS — M47812 Spondylosis without myelopathy or radiculopathy, cervical region: Secondary | ICD-10-CM | POA: Diagnosis not present

## 2020-12-17 NOTE — Telephone Encounter (Signed)
I left a message for the patient to return my call.

## 2020-12-18 DIAGNOSIS — M47812 Spondylosis without myelopathy or radiculopathy, cervical region: Secondary | ICD-10-CM | POA: Diagnosis not present

## 2020-12-18 DIAGNOSIS — T796XXD Traumatic ischemia of muscle, subsequent encounter: Secondary | ICD-10-CM | POA: Diagnosis not present

## 2020-12-18 DIAGNOSIS — M4802 Spinal stenosis, cervical region: Secondary | ICD-10-CM | POA: Diagnosis not present

## 2020-12-18 DIAGNOSIS — U071 COVID-19: Secondary | ICD-10-CM | POA: Diagnosis not present

## 2020-12-18 DIAGNOSIS — M1711 Unilateral primary osteoarthritis, right knee: Secondary | ICD-10-CM | POA: Diagnosis not present

## 2020-12-18 DIAGNOSIS — I10 Essential (primary) hypertension: Secondary | ICD-10-CM | POA: Diagnosis not present

## 2020-12-18 DIAGNOSIS — M4312 Spondylolisthesis, cervical region: Secondary | ICD-10-CM | POA: Diagnosis not present

## 2020-12-18 DIAGNOSIS — M069 Rheumatoid arthritis, unspecified: Secondary | ICD-10-CM | POA: Diagnosis not present

## 2020-12-18 DIAGNOSIS — M5021 Other cervical disc displacement,  high cervical region: Secondary | ICD-10-CM | POA: Diagnosis not present

## 2020-12-21 ENCOUNTER — Telehealth: Payer: Self-pay | Admitting: *Deleted

## 2020-12-21 DIAGNOSIS — M1711 Unilateral primary osteoarthritis, right knee: Secondary | ICD-10-CM | POA: Diagnosis not present

## 2020-12-21 DIAGNOSIS — T796XXD Traumatic ischemia of muscle, subsequent encounter: Secondary | ICD-10-CM | POA: Diagnosis not present

## 2020-12-21 DIAGNOSIS — I10 Essential (primary) hypertension: Secondary | ICD-10-CM | POA: Diagnosis not present

## 2020-12-21 DIAGNOSIS — M4802 Spinal stenosis, cervical region: Secondary | ICD-10-CM | POA: Diagnosis not present

## 2020-12-21 DIAGNOSIS — M069 Rheumatoid arthritis, unspecified: Secondary | ICD-10-CM | POA: Diagnosis not present

## 2020-12-21 DIAGNOSIS — U071 COVID-19: Secondary | ICD-10-CM | POA: Diagnosis not present

## 2020-12-21 DIAGNOSIS — M5021 Other cervical disc displacement,  high cervical region: Secondary | ICD-10-CM | POA: Diagnosis not present

## 2020-12-21 DIAGNOSIS — M4312 Spondylolisthesis, cervical region: Secondary | ICD-10-CM | POA: Diagnosis not present

## 2020-12-21 DIAGNOSIS — M47812 Spondylosis without myelopathy or radiculopathy, cervical region: Secondary | ICD-10-CM | POA: Diagnosis not present

## 2020-12-21 NOTE — Telephone Encounter (Signed)
I left a message for the patient to return my call. Patient has a follow up appointment 12/22/2020.

## 2020-12-22 ENCOUNTER — Other Ambulatory Visit: Payer: Self-pay

## 2020-12-22 ENCOUNTER — Ambulatory Visit (INDEPENDENT_AMBULATORY_CARE_PROVIDER_SITE_OTHER): Payer: Medicare HMO | Admitting: Internal Medicine

## 2020-12-22 VITALS — BP 118/60 | HR 56 | Temp 96.9°F | Resp 16 | Ht 63.0 in | Wt 131.0 lb

## 2020-12-22 DIAGNOSIS — M069 Rheumatoid arthritis, unspecified: Secondary | ICD-10-CM | POA: Diagnosis not present

## 2020-12-22 DIAGNOSIS — N390 Urinary tract infection, site not specified: Secondary | ICD-10-CM

## 2020-12-22 DIAGNOSIS — Z79899 Other long term (current) drug therapy: Secondary | ICD-10-CM

## 2020-12-22 DIAGNOSIS — T796XXD Traumatic ischemia of muscle, subsequent encounter: Secondary | ICD-10-CM

## 2020-12-22 DIAGNOSIS — G250 Essential tremor: Secondary | ICD-10-CM

## 2020-12-22 DIAGNOSIS — E039 Hypothyroidism, unspecified: Secondary | ICD-10-CM | POA: Diagnosis not present

## 2020-12-22 DIAGNOSIS — M25061 Hemarthrosis, right knee: Secondary | ICD-10-CM | POA: Diagnosis not present

## 2020-12-22 DIAGNOSIS — R5381 Other malaise: Secondary | ICD-10-CM

## 2020-12-22 DIAGNOSIS — I10 Essential (primary) hypertension: Secondary | ICD-10-CM

## 2020-12-22 DIAGNOSIS — D539 Nutritional anemia, unspecified: Secondary | ICD-10-CM | POA: Diagnosis not present

## 2020-12-22 DIAGNOSIS — U071 COVID-19: Secondary | ICD-10-CM

## 2020-12-22 NOTE — Progress Notes (Signed)
Dana [name]     This very nice 80 y.o.female was admitted to the hospital on 12/09/2020 and patient was discharged from the hospital on 12/13/2020. The patient now presents for 9 day follow-up  for transition from recent hospitalization.  The day after discharge  our clinical staff contacted the patient to assure stability and schedule a follow up appointment. The discharge summary, medications and diagnostic test results were reviewed before meeting with the patient. The patient was admitted for:   Traumatic rhabdomyolysis  COVID-19 virus infection  Physical deconditioning Urinary tract infection  Hemarthrosis of right knee  Essential hypertension Rheumatoid arthritis involving multiple sites Hypothyroidism Macrocytic anemia  Tremor, essential                  The patient is a very nice 80 y.o.  WWF with HTN, HLD, Prediabetes, Hypothyroid, Rheumatoid Arthritis, Hereditary Tremor  and Vitamin D Deficiency  Who was discovered on the floor by her family & brought to the ER. She tested (+) for Covid. Patient reported dizziness , but denied LOC. Her CPK was elevated 1716. Swas found to have a UTI. Patient was hydrated by IVF and treated for UTI. She had an apparent Rt knee contusion with negative Xray & suspected hemarthrosis. As condition improved, activity was increased.       Hospitalization discharge instructions and medications are reconciled with the patient.       Patient is also followed with Hypertension, Hyperlipidemia, Pre-Diabetes and Vitamin D Deficiency.      Patient is treated for HTN & BP has been controlled at home. Today's BP is at goal - 118/60. Patient has had no complaints of any cardiac type chest pain, palpitations, dyspnea/orthopnea/PND, dizziness, claudication, or dependent edema.     Hyperlipidemia is controlled with diet & meds. Patient denies myalgias or other med SE's. Last Lipids were at goal:  Lab Results  Component Value Date   CHOL 148  10/06/2020   HDL 61 10/06/2020   LDLCALC 69 10/06/2020   TRIG 95 10/06/2020   CHOLHDL 2.4 10/06/2020      Also, the patient has history of PreDiabetes and has had no symptoms of reactive hypoglycemia, diabetic polys, paresthesias or visual blurring.  Last A1c was at goal:  Lab Results  Component Value Date   HGBA1C 5.2 10/06/2020      Further, the patient also has history of Vitamin D Deficiency and supplements vitamin D without any suspected side-effects. Last vitamin D was  Sl low (goal 70-100):  Lab Results  Component Value Date   VD25OH 50 10/06/2020   Current Outpatient Medications on File Prior to Visit  Medication Sig  . acetaminophen 500 MG tablet Take 1,000 mg  at bedtime.  Marland Kitchen VITAMIN D  (5000 U Take 10,000 Units  every evening.  Marland Kitchen VITAMIN B 12  Place 1 tablet under the tongue every evening.  . dicyclomine 20 MG tablet Take 1 tablet 3 x /day before Meals    . folic acid  993 MCG tablet Take 1 tablet daily  . furosemide  40 MG tablet TAKE 1 TABLET 2  TIMES DAILY  . hydroxychloroquine  200 MG tablet  Take one tablet twice daily Monday thru Friday   . levothyroxine 100 MCG tablet Take 1 tablet Mon-Fri and 1/2 tablet on Sat & Sun  . methotrexate 2.5 MG  Take 5 tablets twice weekly - Sun and Wed  . montelukast  10 MG tablet Take 1 tablet Daily  .  Multiple Vitamin  Take 1 tablet by mouth every evening.  . Ondansetron ODT 8 MG  8mg  ODT q4 hours prn nausea   . polyethylene glycol  17 g packet Take 17 g  daily.  . propranolol 80 MG tablet Take 2  times daily For tremors     Allergies  Allergen Reactions  . Codeine Nausea Only  . Mysoline [Primidone] Nausea Only and Other (See Comments)    Over-sedated   PMHx:   Past Medical History:  Diagnosis Date  . Anemia   . Cataract   . Diverticulosis 09/03/2020  . Environmental allergies   . Gastric ulcer   . Heartburn   . Hiatal hernia   . Hyperlipidemia   . Hypertension   . Hypothyroidism   . Migraines   . Prediabetes    . Rash october 2012   all over abdomen and breasts  . Rheumatoid arthritis(714.0)   . Tremor, essential    right hand   Immunization History  Administered Date(s) Administered  . DT (Pediatric) 02/05/2015  . Influenza Split 09/12/2013  . Influenza, High Dose Seasonal PF 10/28/2014, 08/18/2016, 11/22/2018, 12/09/2019, 10/06/2020  . Pneumococcal Conjugate-13 05/12/2015  . Pneumococcal-Unspecified 11/21/2006  . Td 11/22/2003  . Varicella 06/23/2011  . Zoster 06/23/2011   Past Surgical History:  Procedure Laterality Date  . ABDOMINAL HYSTERECTOMY    . CATARACT EXTRACTION, BILATERAL    . CHOLECYSTECTOMY  08/31/11   lap chole   . COLONOSCOPY    . TUBAL LIGATION     FHx:    Reviewed / unchanged  SHx:    Reviewed / unchanged  Systems Review:  Constitutional: Denies fever, chills, wt changes, headaches, insomnia, fatigue, night sweats, change in appetite. Eyes: Denies redness, blurred vision, diplopia, discharge, itchy, watery eyes.  ENT: Denies discharge, congestion, post nasal drip, epistaxis, sore throat, earache, hearing loss, dental pain, tinnitus, vertigo, sinus pain, snoring.  CV: Denies chest pain, palpitations, irregular heartbeat, syncope, dyspnea, diaphoresis, orthopnea, PND, claudication or edema. Respiratory: denies cough, dyspnea, DOE, pleurisy, hoarseness, laryngitis, wheezing.  Gastrointestinal: Denies dysphagia, odynophagia, heartburn, reflux, water brash, abdominal pain or cramps, nausea, vomiting, bloating, diarrhea, constipation, hematemesis, melena, hematochezia  or hemorrhoids. Genitourinary: Denies dysuria, frequency, urgency, nocturia, hesitancy, discharge, hematuria or flank pain. Musculoskeletal: Denies arthralgias, myalgias, stiffness, jt. swelling, pain, limping or strain/sprain.  Skin: Denies pruritus, rash, hives, warts, acne, eczema or change in skin lesion(s). Neuro: No weakness, tremor, incoordination, spasms, paresthesia or pain. Psychiatric: Denies  confusion, memory loss or sensory loss. Endo: Denies change in weight, skin or hair change.  Heme/Lymph: No excessive bleeding, bruising or enlarged lymph nodes.  Physical Exam  BP 118/60   Pulse (!) 56   Temp (!) 96.9 F (36.1 C)   Resp 16   Ht 5\' 3"  (1.6 m)   Wt 131 lb (59.4 kg)   SpO2 97%   BMI 23.21 kg/m   Appears well nourished, well groomed  and in no distress.  Eyes: PERRLA, EOMs, conjunctiva no swelling or erythema. Sinuses: No frontal/maxillary tenderness ENT/Mouth: EAC's clear, TM's nl w/o erythema, bulging. Nares clear w/o erythema, swelling, exudates. Oropharynx clear without erythema or exudates. Oral hygiene is good. Tongue normal, non obstructing. Hearing intact.  Neck: Supple. Thyroid nl. Car 2+/2+ without bruits, nodes or JVD. Chest: Respirations nl with BS clear & equal w/o rales, rhonchi, wheezing or stridor.  Cor: Heart sounds normal w/ regular rate and rhythm without sig. murmurs, gallops, clicks or rubs. Peripheral pulses normal and equal  without  edema.  Abdomen: Soft & bowel sounds normal. Non-tender w/o guarding, rebound, hernias, masses or organomegaly.  Lymphatics: Unremarkable.  Musculoskeletal: Full ROM all peripheral extremities, joint stability, 5/5 strength and normal gait.  Skin: Warm, dry without exposed rashes, lesions or ecchymosis apparent.  Neuro: Cranial nerves intact, reflexes equal bilaterally. Sensory-motor testing grossly intact. Tendon reflexes grossly intact.  Pysch: Alert & oriented x 3.  Insight and judgement nl & appropriate. No ideations.  Assessment and Plan:  1. Traumatic rhabdomyolysis, subsequent encounter  - CK  2. COVID-19 virus infection   3. Physical deconditioning   4. Urinary tract infection without hematuria  - Urinalysis, Routine w reflex microscopic - Urine Culture  5. Hemarthrosis of right knee  - CBC with Differential/Platelet  6. Essential hypertension  - Continue medication, monitor blood pressure  at home.  - Continue DASH diet. Reminder to go to the ER if any CP,  SOB, nausea, dizziness, severe HA, changes vision/speech.  - COMPLETE METABOLIC PANEL WITH GFR  7. Rheumatoid arthritis  ([place])   8. Hypothyroidism   9. Macrocytic anemia  - CBC with Differential/Platelet  10. Tremor, essential   11. Medication management  - CBC with Differential/Platelet - COMPLETE METABOLIC PANEL WITH GFR - CK      Discussed  regular exercise, BP monitoring, weight control to achieve/maintain BMI less than 25 and discussed meds and SE's. Recommended labs to assess and monitor clinical status with further disposition pending results of labs. Over 30 minutes of exam, counseling, chart review was performed.

## 2020-12-23 ENCOUNTER — Telehealth: Payer: Self-pay | Admitting: *Deleted

## 2020-12-23 DIAGNOSIS — N390 Urinary tract infection, site not specified: Secondary | ICD-10-CM | POA: Diagnosis not present

## 2020-12-23 DIAGNOSIS — I10 Essential (primary) hypertension: Secondary | ICD-10-CM | POA: Diagnosis not present

## 2020-12-23 DIAGNOSIS — M4312 Spondylolisthesis, cervical region: Secondary | ICD-10-CM | POA: Diagnosis not present

## 2020-12-23 DIAGNOSIS — Z79899 Other long term (current) drug therapy: Secondary | ICD-10-CM | POA: Diagnosis not present

## 2020-12-23 DIAGNOSIS — U071 COVID-19: Secondary | ICD-10-CM | POA: Diagnosis not present

## 2020-12-23 DIAGNOSIS — M1711 Unilateral primary osteoarthritis, right knee: Secondary | ICD-10-CM | POA: Diagnosis not present

## 2020-12-23 DIAGNOSIS — M4802 Spinal stenosis, cervical region: Secondary | ICD-10-CM | POA: Diagnosis not present

## 2020-12-23 DIAGNOSIS — D539 Nutritional anemia, unspecified: Secondary | ICD-10-CM | POA: Diagnosis not present

## 2020-12-23 DIAGNOSIS — T796XXD Traumatic ischemia of muscle, subsequent encounter: Secondary | ICD-10-CM | POA: Diagnosis not present

## 2020-12-23 DIAGNOSIS — M069 Rheumatoid arthritis, unspecified: Secondary | ICD-10-CM | POA: Diagnosis not present

## 2020-12-23 DIAGNOSIS — M5021 Other cervical disc displacement,  high cervical region: Secondary | ICD-10-CM | POA: Diagnosis not present

## 2020-12-23 DIAGNOSIS — M25061 Hemarthrosis, right knee: Secondary | ICD-10-CM | POA: Diagnosis not present

## 2020-12-23 DIAGNOSIS — M47812 Spondylosis without myelopathy or radiculopathy, cervical region: Secondary | ICD-10-CM | POA: Diagnosis not present

## 2020-12-23 LAB — CBC WITH DIFFERENTIAL/PLATELET
Absolute Monocytes: 280 cells/uL (ref 200–950)
Basophils Absolute: 50 cells/uL (ref 0–200)
Basophils Relative: 1 %
Eosinophils Absolute: 70 cells/uL (ref 15–500)
Eosinophils Relative: 1.4 %
HCT: 31.7 % — ABNORMAL LOW (ref 35.0–45.0)
Hemoglobin: 10.5 g/dL — ABNORMAL LOW (ref 11.7–15.5)
Lymphs Abs: 1830 cells/uL (ref 850–3900)
MCH: 33.1 pg — ABNORMAL HIGH (ref 27.0–33.0)
MCHC: 33.1 g/dL (ref 32.0–36.0)
MCV: 100 fL (ref 80.0–100.0)
MPV: 9.9 fL (ref 7.5–12.5)
Monocytes Relative: 5.6 %
Neutro Abs: 2770 cells/uL (ref 1500–7800)
Neutrophils Relative %: 55.4 %
Platelets: 332 10*3/uL (ref 140–400)
RBC: 3.17 10*6/uL — ABNORMAL LOW (ref 3.80–5.10)
RDW: 14.2 % (ref 11.0–15.0)
Total Lymphocyte: 36.6 %
WBC: 5 10*3/uL (ref 3.8–10.8)

## 2020-12-23 LAB — COMPLETE METABOLIC PANEL WITH GFR
AG Ratio: 1.7 (calc) (ref 1.0–2.5)
ALT: 12 U/L (ref 6–29)
AST: 25 U/L (ref 10–35)
Albumin: 3.3 g/dL — ABNORMAL LOW (ref 3.6–5.1)
Alkaline phosphatase (APISO): 84 U/L (ref 37–153)
BUN/Creatinine Ratio: 20 (calc) (ref 6–22)
BUN: 11 mg/dL (ref 7–25)
CO2: 31 mmol/L (ref 20–32)
Calcium: 8.5 mg/dL — ABNORMAL LOW (ref 8.6–10.4)
Chloride: 106 mmol/L (ref 98–110)
Creat: 0.54 mg/dL — ABNORMAL LOW (ref 0.60–0.88)
GFR, Est African American: 103 mL/min/{1.73_m2} (ref >=60)
GFR, Est Non African American: 88 mL/min/{1.73_m2} (ref >=60)
Globulin: 2 g/dL (calc) (ref 1.9–3.7)
Glucose, Bld: 78 mg/dL (ref 65–99)
Potassium: 4 mmol/L (ref 3.5–5.3)
Sodium: 143 mmol/L (ref 135–146)
Total Bilirubin: 0.5 mg/dL (ref 0.2–1.2)
Total Protein: 5.3 g/dL — ABNORMAL LOW (ref 6.1–8.1)

## 2020-12-23 LAB — CK: Total CK: 56 U/L (ref 29–143)

## 2020-12-23 NOTE — Telephone Encounter (Signed)
Returned call to patient regarding concerns of [facility], Herbert Deaner. Patient has not had BM in 2 days and was advised to try Mirlax 1 dose every 30 to 60 minute until she has a bowel movement. PT reported she has blood tinged sputum in the morning. Patient states she has sinus drainage at night and small amount of blood tinged sputum in the mornings only. PT also request a 2 wheel walker for the patient. An order was placed with [employer] for the walker,  due to unsteady gait. Dr Melford Aase is aware of all.

## 2020-12-25 ENCOUNTER — Other Ambulatory Visit: Payer: Self-pay | Admitting: Internal Medicine

## 2020-12-25 DIAGNOSIS — M5021 Other cervical disc displacement,  high cervical region: Secondary | ICD-10-CM | POA: Diagnosis not present

## 2020-12-25 DIAGNOSIS — U071 COVID-19: Secondary | ICD-10-CM | POA: Diagnosis not present

## 2020-12-25 DIAGNOSIS — I10 Essential (primary) hypertension: Secondary | ICD-10-CM | POA: Diagnosis not present

## 2020-12-25 DIAGNOSIS — M069 Rheumatoid arthritis, unspecified: Secondary | ICD-10-CM | POA: Diagnosis not present

## 2020-12-25 DIAGNOSIS — M4312 Spondylolisthesis, cervical region: Secondary | ICD-10-CM | POA: Diagnosis not present

## 2020-12-25 DIAGNOSIS — M47812 Spondylosis without myelopathy or radiculopathy, cervical region: Secondary | ICD-10-CM | POA: Diagnosis not present

## 2020-12-25 DIAGNOSIS — M6281 Muscle weakness (generalized): Secondary | ICD-10-CM | POA: Diagnosis not present

## 2020-12-25 DIAGNOSIS — M1711 Unilateral primary osteoarthritis, right knee: Secondary | ICD-10-CM | POA: Diagnosis not present

## 2020-12-25 DIAGNOSIS — N390 Urinary tract infection, site not specified: Secondary | ICD-10-CM

## 2020-12-25 DIAGNOSIS — Z9181 History of falling: Secondary | ICD-10-CM | POA: Diagnosis not present

## 2020-12-25 DIAGNOSIS — T796XXD Traumatic ischemia of muscle, subsequent encounter: Secondary | ICD-10-CM | POA: Diagnosis not present

## 2020-12-25 DIAGNOSIS — M4802 Spinal stenosis, cervical region: Secondary | ICD-10-CM | POA: Diagnosis not present

## 2020-12-25 LAB — URINALYSIS, ROUTINE W REFLEX MICROSCOPIC
Bilirubin Urine: NEGATIVE
Glucose, UA: NEGATIVE
Hgb urine dipstick: NEGATIVE
Hyaline Cast: NONE SEEN /LPF
Nitrite: NEGATIVE
Specific Gravity, Urine: 1.025 (ref 1.001–1.03)
pH: 5.5 (ref 5.0–8.0)

## 2020-12-25 LAB — URINE CULTURE
MICRO NUMBER:: 11487388
SPECIMEN QUALITY:: ADEQUATE

## 2020-12-25 MED ORDER — AMOXICILLIN 250 MG PO CAPS: ORAL_CAPSULE | ORAL | 0 refills | Status: DC

## 2020-12-25 NOTE — Progress Notes (Signed)
========================================================== ==========================================================  -    U/C finally returned & grew an infection. Rx for Amoxil sent to drug store.   - schedule a NV in 1 month to recheck urine  ========================================================== ==========================================================  -  CBC shows chronic anemia - & it is stable ========================================================== ==========================================================  -  CK muscle enzymes are back to Normal  ========================================================== ==========================================================  -  All Else  Kidney functions  - Electrolytes - Liver - all  Normal / OK  ===========================================================

## 2020-12-26 ENCOUNTER — Encounter: Payer: Self-pay | Admitting: Internal Medicine

## 2020-12-28 DIAGNOSIS — M5021 Other cervical disc displacement,  high cervical region: Secondary | ICD-10-CM | POA: Diagnosis not present

## 2020-12-28 DIAGNOSIS — U071 COVID-19: Secondary | ICD-10-CM | POA: Diagnosis not present

## 2020-12-28 DIAGNOSIS — M4312 Spondylolisthesis, cervical region: Secondary | ICD-10-CM | POA: Diagnosis not present

## 2020-12-28 DIAGNOSIS — M1711 Unilateral primary osteoarthritis, right knee: Secondary | ICD-10-CM | POA: Diagnosis not present

## 2020-12-28 DIAGNOSIS — M4802 Spinal stenosis, cervical region: Secondary | ICD-10-CM | POA: Diagnosis not present

## 2020-12-28 DIAGNOSIS — I10 Essential (primary) hypertension: Secondary | ICD-10-CM | POA: Diagnosis not present

## 2020-12-28 DIAGNOSIS — T796XXD Traumatic ischemia of muscle, subsequent encounter: Secondary | ICD-10-CM | POA: Diagnosis not present

## 2020-12-28 DIAGNOSIS — M069 Rheumatoid arthritis, unspecified: Secondary | ICD-10-CM | POA: Diagnosis not present

## 2020-12-28 DIAGNOSIS — M47812 Spondylosis without myelopathy or radiculopathy, cervical region: Secondary | ICD-10-CM | POA: Diagnosis not present

## 2020-12-29 ENCOUNTER — Telehealth: Payer: Self-pay | Admitting: *Deleted

## 2020-12-29 ENCOUNTER — Ambulatory Visit: Payer: Medicare HMO | Admitting: Internal Medicine

## 2020-12-29 NOTE — Telephone Encounter (Signed)
Left message to inform Dana [name] from [facility], itis OK for compression hose 30/20 for the patient and take Tylenol 1000 mg 4 times daily for pain, per Dr Melford Aase.

## 2020-12-30 DIAGNOSIS — M47812 Spondylosis without myelopathy or radiculopathy, cervical region: Secondary | ICD-10-CM | POA: Diagnosis not present

## 2020-12-30 DIAGNOSIS — I10 Essential (primary) hypertension: Secondary | ICD-10-CM | POA: Diagnosis not present

## 2020-12-30 DIAGNOSIS — T796XXD Traumatic ischemia of muscle, subsequent encounter: Secondary | ICD-10-CM | POA: Diagnosis not present

## 2020-12-30 DIAGNOSIS — M1711 Unilateral primary osteoarthritis, right knee: Secondary | ICD-10-CM | POA: Diagnosis not present

## 2020-12-30 DIAGNOSIS — M069 Rheumatoid arthritis, unspecified: Secondary | ICD-10-CM | POA: Diagnosis not present

## 2020-12-30 DIAGNOSIS — M4312 Spondylolisthesis, cervical region: Secondary | ICD-10-CM | POA: Diagnosis not present

## 2020-12-30 DIAGNOSIS — U071 COVID-19: Secondary | ICD-10-CM | POA: Diagnosis not present

## 2020-12-30 DIAGNOSIS — M4802 Spinal stenosis, cervical region: Secondary | ICD-10-CM | POA: Diagnosis not present

## 2020-12-30 DIAGNOSIS — M5021 Other cervical disc displacement,  high cervical region: Secondary | ICD-10-CM | POA: Diagnosis not present

## 2020-12-31 DIAGNOSIS — M4802 Spinal stenosis, cervical region: Secondary | ICD-10-CM | POA: Diagnosis not present

## 2020-12-31 DIAGNOSIS — M069 Rheumatoid arthritis, unspecified: Secondary | ICD-10-CM | POA: Diagnosis not present

## 2020-12-31 DIAGNOSIS — I10 Essential (primary) hypertension: Secondary | ICD-10-CM | POA: Diagnosis not present

## 2020-12-31 DIAGNOSIS — M4312 Spondylolisthesis, cervical region: Secondary | ICD-10-CM | POA: Diagnosis not present

## 2020-12-31 DIAGNOSIS — T796XXD Traumatic ischemia of muscle, subsequent encounter: Secondary | ICD-10-CM | POA: Diagnosis not present

## 2020-12-31 DIAGNOSIS — M47812 Spondylosis without myelopathy or radiculopathy, cervical region: Secondary | ICD-10-CM | POA: Diagnosis not present

## 2020-12-31 DIAGNOSIS — M1711 Unilateral primary osteoarthritis, right knee: Secondary | ICD-10-CM | POA: Diagnosis not present

## 2020-12-31 DIAGNOSIS — M5021 Other cervical disc displacement,  high cervical region: Secondary | ICD-10-CM | POA: Diagnosis not present

## 2020-12-31 DIAGNOSIS — U071 COVID-19: Secondary | ICD-10-CM | POA: Diagnosis not present

## 2021-01-01 DIAGNOSIS — M4802 Spinal stenosis, cervical region: Secondary | ICD-10-CM | POA: Diagnosis not present

## 2021-01-01 DIAGNOSIS — T796XXD Traumatic ischemia of muscle, subsequent encounter: Secondary | ICD-10-CM | POA: Diagnosis not present

## 2021-01-01 DIAGNOSIS — M5021 Other cervical disc displacement,  high cervical region: Secondary | ICD-10-CM | POA: Diagnosis not present

## 2021-01-01 DIAGNOSIS — M4312 Spondylolisthesis, cervical region: Secondary | ICD-10-CM | POA: Diagnosis not present

## 2021-01-01 DIAGNOSIS — M069 Rheumatoid arthritis, unspecified: Secondary | ICD-10-CM | POA: Diagnosis not present

## 2021-01-01 DIAGNOSIS — M1711 Unilateral primary osteoarthritis, right knee: Secondary | ICD-10-CM | POA: Diagnosis not present

## 2021-01-01 DIAGNOSIS — U071 COVID-19: Secondary | ICD-10-CM | POA: Diagnosis not present

## 2021-01-01 DIAGNOSIS — I10 Essential (primary) hypertension: Secondary | ICD-10-CM | POA: Diagnosis not present

## 2021-01-01 DIAGNOSIS — M47812 Spondylosis without myelopathy or radiculopathy, cervical region: Secondary | ICD-10-CM | POA: Diagnosis not present

## 2021-01-04 DIAGNOSIS — M4312 Spondylolisthesis, cervical region: Secondary | ICD-10-CM | POA: Diagnosis not present

## 2021-01-04 DIAGNOSIS — M47812 Spondylosis without myelopathy or radiculopathy, cervical region: Secondary | ICD-10-CM | POA: Diagnosis not present

## 2021-01-04 DIAGNOSIS — I10 Essential (primary) hypertension: Secondary | ICD-10-CM | POA: Diagnosis not present

## 2021-01-04 DIAGNOSIS — U071 COVID-19: Secondary | ICD-10-CM | POA: Diagnosis not present

## 2021-01-04 DIAGNOSIS — T796XXD Traumatic ischemia of muscle, subsequent encounter: Secondary | ICD-10-CM | POA: Diagnosis not present

## 2021-01-04 DIAGNOSIS — M4802 Spinal stenosis, cervical region: Secondary | ICD-10-CM | POA: Diagnosis not present

## 2021-01-04 DIAGNOSIS — M1711 Unilateral primary osteoarthritis, right knee: Secondary | ICD-10-CM | POA: Diagnosis not present

## 2021-01-04 DIAGNOSIS — M25561 Pain in right knee: Secondary | ICD-10-CM | POA: Diagnosis not present

## 2021-01-04 DIAGNOSIS — M069 Rheumatoid arthritis, unspecified: Secondary | ICD-10-CM | POA: Diagnosis not present

## 2021-01-04 DIAGNOSIS — M5021 Other cervical disc displacement,  high cervical region: Secondary | ICD-10-CM | POA: Diagnosis not present

## 2021-01-05 DIAGNOSIS — M069 Rheumatoid arthritis, unspecified: Secondary | ICD-10-CM | POA: Diagnosis not present

## 2021-01-05 DIAGNOSIS — U071 COVID-19: Secondary | ICD-10-CM | POA: Diagnosis not present

## 2021-01-05 DIAGNOSIS — T796XXD Traumatic ischemia of muscle, subsequent encounter: Secondary | ICD-10-CM | POA: Diagnosis not present

## 2021-01-05 DIAGNOSIS — M1711 Unilateral primary osteoarthritis, right knee: Secondary | ICD-10-CM | POA: Diagnosis not present

## 2021-01-05 DIAGNOSIS — I10 Essential (primary) hypertension: Secondary | ICD-10-CM | POA: Diagnosis not present

## 2021-01-05 DIAGNOSIS — M4312 Spondylolisthesis, cervical region: Secondary | ICD-10-CM | POA: Diagnosis not present

## 2021-01-05 DIAGNOSIS — M47812 Spondylosis without myelopathy or radiculopathy, cervical region: Secondary | ICD-10-CM | POA: Diagnosis not present

## 2021-01-05 DIAGNOSIS — M4802 Spinal stenosis, cervical region: Secondary | ICD-10-CM | POA: Diagnosis not present

## 2021-01-05 DIAGNOSIS — M5021 Other cervical disc displacement,  high cervical region: Secondary | ICD-10-CM | POA: Diagnosis not present

## 2021-01-07 DIAGNOSIS — M4312 Spondylolisthesis, cervical region: Secondary | ICD-10-CM | POA: Diagnosis not present

## 2021-01-07 DIAGNOSIS — I10 Essential (primary) hypertension: Secondary | ICD-10-CM | POA: Diagnosis not present

## 2021-01-07 DIAGNOSIS — T796XXD Traumatic ischemia of muscle, subsequent encounter: Secondary | ICD-10-CM | POA: Diagnosis not present

## 2021-01-07 DIAGNOSIS — M5021 Other cervical disc displacement,  high cervical region: Secondary | ICD-10-CM | POA: Diagnosis not present

## 2021-01-07 DIAGNOSIS — U071 COVID-19: Secondary | ICD-10-CM | POA: Diagnosis not present

## 2021-01-07 DIAGNOSIS — M1711 Unilateral primary osteoarthritis, right knee: Secondary | ICD-10-CM | POA: Diagnosis not present

## 2021-01-07 DIAGNOSIS — M47812 Spondylosis without myelopathy or radiculopathy, cervical region: Secondary | ICD-10-CM | POA: Diagnosis not present

## 2021-01-07 DIAGNOSIS — M069 Rheumatoid arthritis, unspecified: Secondary | ICD-10-CM | POA: Diagnosis not present

## 2021-01-07 DIAGNOSIS — M4802 Spinal stenosis, cervical region: Secondary | ICD-10-CM | POA: Diagnosis not present

## 2021-01-08 ENCOUNTER — Encounter: Payer: Self-pay | Admitting: Adult Health

## 2021-01-08 NOTE — Progress Notes (Deleted)
MEDICARE ANNUAL WELLNESS VISIT AND FOLLOW UP  Assessment:   Dana [name] was seen today for follow-up and medicare wellness.  Diagnoses and all orders for this visit:  Encounter for Medicare annual wellness exam Yearly  Atherosclerosis of aorta Control blood pressure, cholesterol, glucose, increase exercise.   Essential hypertension Doing well continue Current regiment Will continue to monitor Low sodium diet  Hyperlipidemia, mixed Taking Praavachol 40mg  Discussed dietary and exercise modifications Check lipid panel   Hypothyroidism, unspecified type continue medications the same pending lab results reminded to take on an empty stomach 30-42mins before food.  check TSH level  Tremor, essential Taking Inderol and valium for this *** Doing well Managing, no complications at this time.  Other abnormal glucose; hx of prediabetes Discussed dietary and exercise modifications Will monitor A1c annually or q69m if elevated  Osteopenia of left hip Continue Calcium and Vit D supplimentation DEXA ***  Vitamin D deficiency Continue supplimentation  Non-seasonal allergic rhinitis, unspecified trigger Taking singular Doing well at this time  Medication management CBC, CMP/GFR, mangesium   Anemia Normocytic; recent normal deficiency checks; monitor  Former smoker (*** pack year history, quit 1985) Recent clear CXR 11/2020; denies concerning sx  Over 40 minutes of exam, counseling, chart review and critical decision making was performed Future Appointments  Date Time Provider [place]  01/11/2021  3:00 PM Liane Comber, NP [facility] None  04/15/2021 11:30 AM Unk Pinto, MD [facility] None  10/21/2021 10:00 AM Unk Pinto, MD [facility] None  01/11/2022  3:00 PM Liane Comber, NP [facility] None     Plan:   During the course of the visit the patient was educated and counseled about appropriate screening and preventive services including:     Pneumococcal vaccine   Prevnar 13  Influenza vaccine  Td vaccine  Screening electrocardiogram  Bone densitometry screening  Colorectal cancer screening  Diabetes screening  Glaucoma screening  Nutrition counseling   Advanced directives: requested   Subjective:  Dana [name] is a 80 y.o. female who presents for Medicare Annual Wellness Visit and 3 month follow up for HTN, HLD, Hypothyroidism, Pre-Diabetes, Vitamin D Defciency, RA, and hereditary essential tremor.  She was recently hospitalized in Jan 2022 after she fell at home and was found several hours later by family; had rhabdo with CPK ^^ 1716 which has since resolved following IVF; incidentally + covid 19 without clear sx; questionable UTI that was treated. Had R knee contusion with suspected hemarthrosis, was recommended conservative interventions only and to follow up with Dr. Norm Salt ***  *** UTI follow up?   *** review and resolve dx ***  Some concern with valium contributing to falls ***  She is following with [facility] for cryo for precancerous lesions.   Patient diagnosed with RA in 2001 and stable on MTX and plaquenil and follows with [facility].  She has an essential tremor and taking propanolol for this 80mg  TID, also valium PRN.   BMI is There is no height or weight on file to calculate BMI., she has not been working on diet and exercise, not exercising much due to covid 19 and cold weather.  Wt Readings from Last 3 Encounters:  12/22/20 131 lb (59.4 kg)  12/09/20 133 lb (60.3 kg)  10/06/20 135 lb 12.8 oz (61.6 kg)   She has had elevated blood pressure since 1998. Her blood pressure has been controlled at home, today their BP is   She does not workout. She denies chest pain, shortness of breath, dizziness.   ***  former smoker *** quit 1985, *** pack year hx, CXR 11/2020 showed no lung abnormalities She has aortic atherosclerosis per CT 08/2020.   She is on cholesterol medication  (pravastatin 40 mg daily) and denies myalgias. Her cholesterol is at goal. The cholesterol last visit was:   Lab Results  Component Value Date   CHOL 148 10/06/2020   HDL 61 10/06/2020   LDLCALC 69 10/06/2020   TRIG 95 10/06/2020   CHOLHDL 2.4 10/06/2020   She has had brief intermittent hx prediabetes since 2012 (5.8% in 2015). She has been working on diet and exercise for glucose management, and denies hyperglycemia, hypoglycemia , increased appetite, nausea, paresthesia of the feet, polydipsia and polyuria. Last A1C in the office was:  Lab Results  Component Value Date   HGBA1C 5.2 10/06/2020   She is on thyroid medication. Her medication was not changed last visit.  *** Lab Results  Component Value Date   TSH 4.85 (H) 10/06/2020   Last GFR: Lab Results  Component Value Date   GFRNONAA 88 12/22/2020   Patient is on Vitamin D supplement.   Lab Results  Component Value Date   VD25OH 50 10/06/2020      She has intermitent normocytic anemia; she is on M25 and folic acid supplements;  CBC Latest Ref Rng & Units 12/22/2020 12/13/2020 12/12/2020  WBC 3.8 - 10.8 Thousand/uL 5.0 3.0(L) 3.9(L)  Hemoglobin 11.7 - 15.5 g/dL 10.5(L) 9.0(L) 9.0(L)  Hematocrit 35.0 - 45.0 % 31.7(L) 27.4(L) 28.5(L)  Platelets 140 - 400 Thousand/uL 332 159 159   Lab Results  Component Value Date   VITAMINB12 933 (H) 12/12/2020   Lab Results  Component Value Date   IRON 38 12/12/2020   TIBC 197 (L) 12/12/2020   FERRITIN 101 12/12/2020   Lab Results  Component Value Date   FOLATE 18.6 12/12/2020   Lab Results  Component Value Date   RETICCTPCT 0.6 12/12/2020      Medication Review: Current Outpatient Medications on File Prior to Visit  Medication Sig Dispense Refill  . acetaminophen (TYLENOL) 500 MG tablet Take 1,000 mg by mouth at bedtime.    Marland Kitchen amoxicillin (AMOXIL) 250 MG capsule Take 1 capsule 3 x /day with meals for infection 30 capsule 0  . Cholecalciferol (VITAMIN D) 125 MCG (5000 UT)  CAPS Take 10,000 Units by mouth every evening.    . Cyanocobalamin (VITAMIN B 12 PO) Place 1 tablet under the tongue every evening.    . dicyclomine (BENTYL) 20 MG tablet Take      1 tablet       3 x /day       before Meals        for Cramping, Bloating , Nausea or Diarrhea (Patient taking differently: Take 20 mg by mouth 3 (three) times daily as needed (cramping, bloating, nausea or diarrhea).) 003 tablet 0  . folic acid (FOLVITE) 704 MCG tablet Take 1 tablet daily. (Patient taking differently: Take 800 mcg by mouth daily.) 90 tablet 1  . furosemide (LASIX) 40 MG tablet TAKE 1 TABLET 2  TIMES DAILY FOR FLUID  RETENTION AND ANKLE  SWELLING (Patient taking differently: Take 40 mg by mouth 2 (two) times daily. FOR FLUID  RETENTION AND ANKLE SWELLING) 180 tablet 3  . hydroxychloroquine (PLAQUENIL) 200 MG tablet Take 200 mg by mouth See admin instructions. Take one tablet (200 mcg) by mouth twice daily Monday thru Friday (skip Saturday and Sunday)    . levothyroxine (SYNTHROID) 100 MCG tablet Take  1 tablet       Daily       on an empty stomach with only water for 30 minutes & no Antacid meds, Calcium or Magnesium for 4 hours & avoid Biotin (Patient taking differently: Take 50-100 mcg by mouth See admin instructions. Take 1 tablet (100 mcg) by mouth daily on Monday thru Friday mornings; take 1/2 tablet (50 mcg) on Saturday and Sunday mornings. Take on an empty stomach with only water for 30 minutes & no Antacid meds, Calcium or Magnesium for 4 hours & avoid Biotin) 90 tablet 0  . methotrexate (RHEUMATREX) 2.5 MG tablet Take 12.5 mg by mouth See admin instructions. Take 5 tablets (12.5 mg) by mouth twice weekly - Sunday and Wednesday    . montelukast (SINGULAIR) 10 MG tablet Take     1 tablet     Daily      for Allergies (Patient taking differently: Take 10 mg by mouth daily.) 90 tablet 0  . Multiple Vitamin (MULTIVITAMIN WITH MINERALS) TABS tablet Take 1 tablet by mouth every evening.    . ondansetron  (ZOFRAN ODT) 8 MG disintegrating tablet 8mg  ODT q4 hours prn nausea (Patient taking differently: Take 8 mg by mouth every 4 (four) hours as needed for nausea or vomiting.) 30 tablet 3  . polyethylene glycol (MIRALAX / GLYCOLAX) 17 g packet Take 17 g by mouth daily. 14 each 0  . propranolol (INDERAL) 80 MG tablet TAKE 1 TABLET BY MOUTH 3  TIMES DAILY (Patient taking differently: Take 80 mg by mouth 2 (two) times daily. For tremors) 270 tablet 3   No current facility-administered medications on file prior to visit.    Allergies  Allergen Reactions  . Codeine Nausea Only  . Mysoline [Primidone] Nausea Only and Other (See Comments)    Over-sedated    Current Problems (verified) Patient Active Problem List   Diagnosis Date Noted  . COVID-19 virus infection 12/09/2020 12/10/2020  . Right knee pain 12/10/2020  . Hypokalemia 12/10/2020  . Rhabdomyolysis, traumatic   12/09/2020 12/09/2020  . Diverticulosis 09/03/2020  . Aortic atherosclerosis ([place]) by CT scan 08/2020 08/17/2020  . Osteopenia 12/05/2018  . FHx: heart disease 09/03/2018  . Former smoker 09/03/2018  . Overweight (BMI 25.0-29.9) 05/30/2018  . Generalized OA 09/02/2015  . Vitamin D deficiency 01/06/2014  . Hypertension   . Other abnormal glucose   . Rheumatoid arthritis ([place])   . Hereditary essential tremor   . Hypothyroidism   . Migraines   . Hyperlipidemia, mixed   . Anemia     Screening Tests Immunization History  Administered Date(s) Administered  . DT (Pediatric) 02/05/2015  . Influenza Split 09/12/2013  . Influenza, High Dose Seasonal PF 10/28/2014, 08/18/2016, 11/22/2018, 12/09/2019, 10/06/2020  . Pneumococcal Conjugate-13 05/12/2015  . Pneumococcal-Unspecified 11/21/2006  . Td 11/22/2003  . Varicella 06/23/2011  . Zoster 06/23/2011   ***  Preventative care: Last colonoscopy: 2013, normal except mild diverticulosis of sigmoid; DONE Last mammogram: 08/2019 Last pap smear/pelvic exam: Hysterectomy    DEXA: 08/2018 L fem neck T-1.9  Prior vaccinations: TD or Tdap: 2016  Influenza: 11/2018 due TODAY  Pneumococcal: 2008 Prevnar13: 2016 Shingles/Zostavax: 2017  Names of Other Physician/Practitioners you currently use: 1. [facility] here for primary care 2. Eye Exam, Dr. Ellie Lunch every six months, last 06/2019 3. Dentist, Due  Patient Care Team: Unk Pinto, MD as PCP - General (Internal Medicine) Roetta Sessions, MD as Referring Physician (Internal Medicine)  SURGICAL HISTORY She  has a past surgical history that includes Abdominal hysterectomy; Tubal ligation; Cataract extraction, bilateral; Cholecystectomy (08/31/11); and Colonoscopy. FAMILY HISTORY Her family history includes Diabetes in her mother and sister; Heart attack (age of onset: 31) in her son; Heart disease in her father, maternal aunt, maternal uncle, and mother. SOCIAL HISTORY She  reports that she quit smoking about 36 years ago. Her smoking use included cigarettes. She has never used smokeless tobacco. She reports that she does not drink alcohol and does not use drugs.   MEDICARE WELLNESS OBJECTIVES: Physical activity:   Cardiac risk factors:   Depression/mood screen:   Depression screen [facility] 2/9 12/26/2020  Decreased Interest 0  Down, Depressed, Hopeless 0  PHQ - 2 Score 0    ADLs:  In your present state of health, do you have any difficulty performing the following activities: 12/10/2020 10/06/2020  Hearing? N N  Vision? N N  Difficulty concentrating or making decisions? N N  Walking or climbing stairs? Y N  Dressing or bathing? N N  Doing errands, shopping? N N  Some recent data might be hidden     Cognitive Testing  Alert? Yes  Normal Appearance?Yes  Oriented to person? Yes  Place? Yes   Time? Yes  Recall of three objects?  Yes  Can perform simple calculations? Yes  Displays appropriate judgment?Yes  Can read the correct time from a watch  face?Yes  EOL planning:    Review of Systems  Constitutional: Negative for chills, fever, malaise/fatigue and weight loss.  HENT: Negative for congestion, ear pain, hearing loss, sore throat and tinnitus.   Eyes: Negative for blurred vision and double vision.  Respiratory: Negative for cough, hemoptysis, sputum production, shortness of breath and wheezing.   Cardiovascular: Negative for chest pain, palpitations, orthopnea, claudication, leg swelling and PND.  Gastrointestinal: Negative for abdominal pain, blood in stool, constipation, diarrhea, heartburn, melena, nausea and vomiting.  Genitourinary: Negative for dysuria, flank pain, frequency, hematuria and urgency.  Musculoskeletal: Positive for joint pain (intermittent foot pain, "burning" ). Negative for back pain, falls, myalgias and neck pain.  Skin: Negative for rash.  Neurological: Positive for tremors. Negative for dizziness, tingling, sensory change, speech change, focal weakness, seizures, loss of consciousness, weakness and headaches.  Endo/Heme/Allergies: Negative for environmental allergies and polydipsia. Does not bruise/bleed easily.  Psychiatric/Behavioral: Negative for depression, hallucinations, memory loss, substance abuse and suicidal ideas. The patient is not nervous/anxious and does not have insomnia.      Objective:     There were no vitals filed for this visit. There is no height or weight on file to calculate BMI.  General appearance: alert, no distress, WD/WN, female HEENT: normocephalic, sclerae anicteric, TMs pearly, nares patent, no discharge or erythema, pharynx normal Oral cavity: MMM, no lesions Neck: supple, no lymphadenopathy, no thyromegaly, no masses Heart: RRR, normal S1, S2, no murmurs Lungs: CTA bilaterally, no wheezes, rhonchi, or rales Abdomen: +bs, soft, non tender, non distended, no masses, no hepatomegaly, no splenomegaly Musculoskeletal: nontender, no swelling, no obvious  deformity Extremities: no edema, no cyanosis, no clubbing Pulses: 2+ symmetric, upper and lower extremities, normal cap refill Neurological: alert, oriented x 3, CN2-12 intact, strength normal upper extremities and lower extremities, sensation normal throughout, DTRs 2+ throughout, no cerebellar signs, gait slow steady, subtle head and vocal tremor Psychiatric: normal affect, behavior normal, pleasant   Medicare Attestation I have personally reviewed: The patient's medical and social history Their use of alcohol, tobacco or illicit drugs Their current medications and supplements  The patient's functional ability including ADLs,fall risks, home safety risks, cognitive, and hearing and visual impairment Diet and physical activities Evidence for depression or mood disorders  The patient's weight, height, BMI, and visual acuity have been recorded in the chart.  I have made referrals, counseling, and provided education to the patient based on review of the above and I have provided the patient with a written personalized care plan for preventive services.     Izora Ribas, NP   01/08/2021

## 2021-01-11 ENCOUNTER — Other Ambulatory Visit: Payer: Self-pay

## 2021-01-11 ENCOUNTER — Encounter: Payer: Self-pay | Admitting: Adult Health

## 2021-01-11 ENCOUNTER — Ambulatory Visit: Payer: Medicare HMO | Admitting: Adult Health

## 2021-01-11 VITALS — BP 119/53 | HR 65 | Temp 97.5°F | Wt 123.6 lb

## 2021-01-11 DIAGNOSIS — J069 Acute upper respiratory infection, unspecified: Secondary | ICD-10-CM | POA: Diagnosis not present

## 2021-01-11 DIAGNOSIS — Z87891 Personal history of nicotine dependence: Secondary | ICD-10-CM

## 2021-01-11 DIAGNOSIS — K5790 Diverticulosis of intestine, part unspecified, without perforation or abscess without bleeding: Secondary | ICD-10-CM

## 2021-01-11 DIAGNOSIS — M159 Polyosteoarthritis, unspecified: Secondary | ICD-10-CM

## 2021-01-11 DIAGNOSIS — Z0000 Encounter for general adult medical examination without abnormal findings: Secondary | ICD-10-CM

## 2021-01-11 DIAGNOSIS — I10 Essential (primary) hypertension: Secondary | ICD-10-CM

## 2021-01-11 DIAGNOSIS — E876 Hypokalemia: Secondary | ICD-10-CM

## 2021-01-11 DIAGNOSIS — E039 Hypothyroidism, unspecified: Secondary | ICD-10-CM

## 2021-01-11 DIAGNOSIS — G43809 Other migraine, not intractable, without status migrainosus: Secondary | ICD-10-CM

## 2021-01-11 DIAGNOSIS — D649 Anemia, unspecified: Secondary | ICD-10-CM

## 2021-01-11 DIAGNOSIS — R7309 Other abnormal glucose: Secondary | ICD-10-CM

## 2021-01-11 DIAGNOSIS — E663 Overweight: Secondary | ICD-10-CM

## 2021-01-11 DIAGNOSIS — M85852 Other specified disorders of bone density and structure, left thigh: Secondary | ICD-10-CM

## 2021-01-11 DIAGNOSIS — G250 Essential tremor: Secondary | ICD-10-CM

## 2021-01-11 DIAGNOSIS — M069 Rheumatoid arthritis, unspecified: Secondary | ICD-10-CM

## 2021-01-11 DIAGNOSIS — E782 Mixed hyperlipidemia: Secondary | ICD-10-CM

## 2021-01-11 DIAGNOSIS — I700 Atherosclerosis of aorta: Secondary | ICD-10-CM

## 2021-01-11 DIAGNOSIS — E559 Vitamin D deficiency, unspecified: Secondary | ICD-10-CM

## 2021-01-11 NOTE — Progress Notes (Signed)
Virtual Visit via Telephone Note  I connected with Dana [name] on 01/11/21 at  3:00 PM EST by telephone and verified that I am speaking with the correct person using two identifiers.  Location: Patient: home Provider: [place] office   I discussed the limitations, risks, security and privacy concerns of performing an evaluation and management service by telephone and the availability of in person appointments. I also discussed with the patient that there may be a patient responsible charge related to this service. The patient expressed understanding and agreed to proceed.   History of Present Illness:  BP (!) 119/53   Pulse 65   Temp (!) 97.5 F (36.4 C)   Wt 123 lb 9.6 oz (56.1 kg)   BMI 21.89 kg/m   80 y.o. female with hx of seasonal allergies ([place]), htn, recent admission 12/09/2020 following syncope with covid 19 requested evaluation due to new URI/sinus sx.   She was recently hospitalized in Jan 2022 after she fell at home and was found several hours later by family; had rhabdo with CPK ^^ 1716 which has since resolved following IVF; incidentally + covid 19 without clear sx; questionable UTI that was treated. Had R knee contusion with suspected hemarthrosis, was recommended conservative interventions only and to follow up with Dr. Norm Salt, saw PA last week, got steroid injection which has improved sx. Working with PT in home. She is also completing amoxicillin 250 mg TID x 10 day course for UTI noted on UA 12/23/2020. Denies UTI sx.   She reports she woke up yesterday with nasal congestion and sinus pressure, nasal drainage, mild cough yesterday, not productive. She denies any cough today. Sx seem improved with tylenol cold/flu today. Denies HA, sore throat, fever/chills. She also reports some sneezing. Denies watery/itchy eyes.   This is typical season for allergies, admits hasn't started antihistamine. She is on singulair.   Allergies:  Allergies  Allergen Reactions  .  Codeine Nausea Only  . Mysoline [Primidone] Nausea Only and Other (See Comments)    Over-sedated   Medical History:  has Rheumatoid arthritis ([place]); Hereditary essential tremor; Hypothyroidism; Migraines; Hyperlipidemia, mixed; Anemia; Hypertension; Other abnormal glucose; Vitamin D deficiency; Generalized OA; Overweight (BMI 25.0-29.9); FHx: heart disease; Former smoker (quit 1985); Osteopenia; Aortic atherosclerosis ([place]) by CT scan 08/2020; Diverticulosis; COVID-19 virus infection 12/09/2020; Right knee pain; and Hypokalemia on their problem list. Surgical History:  She  has a past surgical history that includes Abdominal hysterectomy; Tubal ligation; Cataract extraction, bilateral; Cholecystectomy (08/31/11); and Colonoscopy. Family History:  Herfamily history includes Diabetes in her mother and sister; Heart attack (age of onset: 69) in her son; Heart disease in her father, maternal aunt, maternal uncle, and mother. Social History:   reports that she quit smoking about 36 years ago. Her smoking use included cigarettes. She has never used smokeless tobacco. She reports that she does not drink alcohol and does not use drugs.    Observations/Objective:  General : Well sounding patient in no apparent distress HEENT: no hoarseness, no cough for duration of visit. Vocal tremor consistent with baseline.  Lungs: speaks in complete sentences, no audible wheezing, no apparent distress Neurological: alert, oriented x 3 Psychiatric: pleasant, judgement appropriate   Assessment and Plan:  Dana [name] was seen today for acute visit.  Diagnoses and all orders for this visit:  URI with cough and congestion Mild sx; day 2, improving with OTC medication Recent covid 19, fully recovered, no testing needed Discussed the importance of avoiding unnecessary antibiotic therapy -  she is currently completing amoxicillin course Most likely viral vs allergies, she is in agreement Suggested symptomatic OTC  remedies. Nasal saline spray for congestion. Nasal steroids, allergy pill Declines steroid taper or prescription cough med Follow up as needed.    Follow Up Instructions:    I discussed the assessment and treatment plan with the patient. The patient was provided an opportunity to ask questions and all were answered. The patient agreed with the plan and demonstrated an understanding of the instructions.   The patient was advised to call back or seek an in-person evaluation if the symptoms worsen or if the condition fails to improve as anticipated.  I provided 15 minutes of non-face-to-face time during this encounter.   Izora Ribas, NP   Future Appointments  Date Time Provider [place]  04/15/2021 11:30 AM Unk Pinto, MD [facility] None  10/21/2021 10:00 AM Unk Pinto, MD [facility] None  01/11/2022  3:00 PM Liane Comber, NP [facility] None

## 2021-01-12 DIAGNOSIS — M069 Rheumatoid arthritis, unspecified: Secondary | ICD-10-CM | POA: Diagnosis not present

## 2021-01-12 DIAGNOSIS — M1711 Unilateral primary osteoarthritis, right knee: Secondary | ICD-10-CM | POA: Diagnosis not present

## 2021-01-12 DIAGNOSIS — U071 COVID-19: Secondary | ICD-10-CM | POA: Diagnosis not present

## 2021-01-12 DIAGNOSIS — M5021 Other cervical disc displacement,  high cervical region: Secondary | ICD-10-CM | POA: Diagnosis not present

## 2021-01-12 DIAGNOSIS — I10 Essential (primary) hypertension: Secondary | ICD-10-CM | POA: Diagnosis not present

## 2021-01-12 DIAGNOSIS — M47812 Spondylosis without myelopathy or radiculopathy, cervical region: Secondary | ICD-10-CM | POA: Diagnosis not present

## 2021-01-12 DIAGNOSIS — M4312 Spondylolisthesis, cervical region: Secondary | ICD-10-CM | POA: Diagnosis not present

## 2021-01-12 DIAGNOSIS — T796XXD Traumatic ischemia of muscle, subsequent encounter: Secondary | ICD-10-CM | POA: Diagnosis not present

## 2021-01-12 DIAGNOSIS — M4802 Spinal stenosis, cervical region: Secondary | ICD-10-CM | POA: Diagnosis not present

## 2021-01-13 ENCOUNTER — Other Ambulatory Visit: Payer: Self-pay | Admitting: Internal Medicine

## 2021-01-13 DIAGNOSIS — J3089 Other allergic rhinitis: Secondary | ICD-10-CM

## 2021-01-14 DIAGNOSIS — M069 Rheumatoid arthritis, unspecified: Secondary | ICD-10-CM | POA: Diagnosis not present

## 2021-01-14 DIAGNOSIS — M5021 Other cervical disc displacement,  high cervical region: Secondary | ICD-10-CM | POA: Diagnosis not present

## 2021-01-14 DIAGNOSIS — M4802 Spinal stenosis, cervical region: Secondary | ICD-10-CM | POA: Diagnosis not present

## 2021-01-14 DIAGNOSIS — M1711 Unilateral primary osteoarthritis, right knee: Secondary | ICD-10-CM | POA: Diagnosis not present

## 2021-01-14 DIAGNOSIS — T796XXD Traumatic ischemia of muscle, subsequent encounter: Secondary | ICD-10-CM | POA: Diagnosis not present

## 2021-01-14 DIAGNOSIS — U071 COVID-19: Secondary | ICD-10-CM | POA: Diagnosis not present

## 2021-01-14 DIAGNOSIS — M4312 Spondylolisthesis, cervical region: Secondary | ICD-10-CM | POA: Diagnosis not present

## 2021-01-14 DIAGNOSIS — I10 Essential (primary) hypertension: Secondary | ICD-10-CM | POA: Diagnosis not present

## 2021-01-14 DIAGNOSIS — M47812 Spondylosis without myelopathy or radiculopathy, cervical region: Secondary | ICD-10-CM | POA: Diagnosis not present

## 2021-01-15 ENCOUNTER — Telehealth: Payer: Self-pay

## 2021-01-15 ENCOUNTER — Other Ambulatory Visit: Payer: Self-pay | Admitting: Adult Health

## 2021-01-15 MED ORDER — PREDNISONE 20 MG PO TABS: ORAL_TABLET | ORAL | 0 refills | Status: DC

## 2021-01-15 NOTE — Telephone Encounter (Signed)
Requesting a refill on the Amoxicillin. States that [facility] told her she could refill it. Please advise.

## 2021-01-18 DIAGNOSIS — M1711 Unilateral primary osteoarthritis, right knee: Secondary | ICD-10-CM | POA: Diagnosis not present

## 2021-01-18 DIAGNOSIS — M4802 Spinal stenosis, cervical region: Secondary | ICD-10-CM | POA: Diagnosis not present

## 2021-01-18 DIAGNOSIS — T796XXD Traumatic ischemia of muscle, subsequent encounter: Secondary | ICD-10-CM | POA: Diagnosis not present

## 2021-01-18 DIAGNOSIS — M5021 Other cervical disc displacement,  high cervical region: Secondary | ICD-10-CM | POA: Diagnosis not present

## 2021-01-18 DIAGNOSIS — U071 COVID-19: Secondary | ICD-10-CM | POA: Diagnosis not present

## 2021-01-18 DIAGNOSIS — I10 Essential (primary) hypertension: Secondary | ICD-10-CM | POA: Diagnosis not present

## 2021-01-18 DIAGNOSIS — M47812 Spondylosis without myelopathy or radiculopathy, cervical region: Secondary | ICD-10-CM | POA: Diagnosis not present

## 2021-01-18 DIAGNOSIS — M069 Rheumatoid arthritis, unspecified: Secondary | ICD-10-CM | POA: Diagnosis not present

## 2021-01-18 DIAGNOSIS — M4312 Spondylolisthesis, cervical region: Secondary | ICD-10-CM | POA: Diagnosis not present

## 2021-01-21 DIAGNOSIS — M47812 Spondylosis without myelopathy or radiculopathy, cervical region: Secondary | ICD-10-CM | POA: Diagnosis not present

## 2021-01-21 DIAGNOSIS — M4312 Spondylolisthesis, cervical region: Secondary | ICD-10-CM | POA: Diagnosis not present

## 2021-01-21 DIAGNOSIS — M5021 Other cervical disc displacement,  high cervical region: Secondary | ICD-10-CM | POA: Diagnosis not present

## 2021-01-21 DIAGNOSIS — M069 Rheumatoid arthritis, unspecified: Secondary | ICD-10-CM | POA: Diagnosis not present

## 2021-01-21 DIAGNOSIS — I10 Essential (primary) hypertension: Secondary | ICD-10-CM | POA: Diagnosis not present

## 2021-01-21 DIAGNOSIS — M4802 Spinal stenosis, cervical region: Secondary | ICD-10-CM | POA: Diagnosis not present

## 2021-01-21 DIAGNOSIS — M1711 Unilateral primary osteoarthritis, right knee: Secondary | ICD-10-CM | POA: Diagnosis not present

## 2021-01-21 DIAGNOSIS — U071 COVID-19: Secondary | ICD-10-CM | POA: Diagnosis not present

## 2021-01-21 DIAGNOSIS — T796XXD Traumatic ischemia of muscle, subsequent encounter: Secondary | ICD-10-CM | POA: Diagnosis not present

## 2021-01-26 ENCOUNTER — Telehealth: Payer: Self-pay

## 2021-01-26 DIAGNOSIS — M1711 Unilateral primary osteoarthritis, right knee: Secondary | ICD-10-CM | POA: Diagnosis not present

## 2021-01-26 DIAGNOSIS — M069 Rheumatoid arthritis, unspecified: Secondary | ICD-10-CM | POA: Diagnosis not present

## 2021-01-26 DIAGNOSIS — M47812 Spondylosis without myelopathy or radiculopathy, cervical region: Secondary | ICD-10-CM | POA: Diagnosis not present

## 2021-01-26 DIAGNOSIS — M4802 Spinal stenosis, cervical region: Secondary | ICD-10-CM | POA: Diagnosis not present

## 2021-01-26 DIAGNOSIS — T796XXD Traumatic ischemia of muscle, subsequent encounter: Secondary | ICD-10-CM | POA: Diagnosis not present

## 2021-01-26 DIAGNOSIS — U071 COVID-19: Secondary | ICD-10-CM | POA: Diagnosis not present

## 2021-01-26 DIAGNOSIS — I10 Essential (primary) hypertension: Secondary | ICD-10-CM | POA: Diagnosis not present

## 2021-01-26 DIAGNOSIS — M4312 Spondylolisthesis, cervical region: Secondary | ICD-10-CM | POA: Diagnosis not present

## 2021-01-26 DIAGNOSIS — M5021 Other cervical disc displacement,  high cervical region: Secondary | ICD-10-CM | POA: Diagnosis not present

## 2021-01-26 NOTE — Telephone Encounter (Signed)
Dana [name] from [facility] called to let us know that the patient is having bilateral lower extremity pain with edema. Working on getting compression stockings. Currently taking Lasix 40mg  once daily, occasionally BID. Patient wants to know if she is allowed to drive.

## 2021-01-27 NOTE — Telephone Encounter (Signed)
Patient aware.

## 2021-01-28 ENCOUNTER — Other Ambulatory Visit: Payer: Self-pay | Admitting: Internal Medicine

## 2021-01-28 DIAGNOSIS — K580 Irritable bowel syndrome with diarrhea: Secondary | ICD-10-CM

## 2021-01-28 MED ORDER — DICYCLOMINE HCL 20 MG PO TABS: ORAL_TABLET | ORAL | 0 refills | Status: DC

## 2021-02-03 DIAGNOSIS — M1711 Unilateral primary osteoarthritis, right knee: Secondary | ICD-10-CM | POA: Diagnosis not present

## 2021-02-03 DIAGNOSIS — M4312 Spondylolisthesis, cervical region: Secondary | ICD-10-CM | POA: Diagnosis not present

## 2021-02-03 DIAGNOSIS — M47812 Spondylosis without myelopathy or radiculopathy, cervical region: Secondary | ICD-10-CM | POA: Diagnosis not present

## 2021-02-03 DIAGNOSIS — U071 COVID-19: Secondary | ICD-10-CM | POA: Diagnosis not present

## 2021-02-03 DIAGNOSIS — I10 Essential (primary) hypertension: Secondary | ICD-10-CM | POA: Diagnosis not present

## 2021-02-03 DIAGNOSIS — M069 Rheumatoid arthritis, unspecified: Secondary | ICD-10-CM | POA: Diagnosis not present

## 2021-02-03 DIAGNOSIS — T796XXD Traumatic ischemia of muscle, subsequent encounter: Secondary | ICD-10-CM | POA: Diagnosis not present

## 2021-02-03 DIAGNOSIS — M5021 Other cervical disc displacement,  high cervical region: Secondary | ICD-10-CM | POA: Diagnosis not present

## 2021-02-03 DIAGNOSIS — M4802 Spinal stenosis, cervical region: Secondary | ICD-10-CM | POA: Diagnosis not present

## 2021-02-04 ENCOUNTER — Telehealth: Payer: Self-pay | Admitting: *Deleted

## 2021-02-04 NOTE — Telephone Encounter (Signed)
Left message to inform Dana [name], physical therapist, it is OK to extend PT 1 time a week for 8 weeks, per Dr Melford Aase.

## 2021-02-11 DIAGNOSIS — M1711 Unilateral primary osteoarthritis, right knee: Secondary | ICD-10-CM | POA: Diagnosis not present

## 2021-02-11 DIAGNOSIS — U071 COVID-19: Secondary | ICD-10-CM | POA: Diagnosis not present

## 2021-02-11 DIAGNOSIS — M4802 Spinal stenosis, cervical region: Secondary | ICD-10-CM | POA: Diagnosis not present

## 2021-02-11 DIAGNOSIS — M4312 Spondylolisthesis, cervical region: Secondary | ICD-10-CM | POA: Diagnosis not present

## 2021-02-11 DIAGNOSIS — M069 Rheumatoid arthritis, unspecified: Secondary | ICD-10-CM | POA: Diagnosis not present

## 2021-02-11 DIAGNOSIS — I10 Essential (primary) hypertension: Secondary | ICD-10-CM | POA: Diagnosis not present

## 2021-02-11 DIAGNOSIS — M5021 Other cervical disc displacement,  high cervical region: Secondary | ICD-10-CM | POA: Diagnosis not present

## 2021-02-11 DIAGNOSIS — T796XXD Traumatic ischemia of muscle, subsequent encounter: Secondary | ICD-10-CM | POA: Diagnosis not present

## 2021-02-11 DIAGNOSIS — M47812 Spondylosis without myelopathy or radiculopathy, cervical region: Secondary | ICD-10-CM | POA: Diagnosis not present

## 2021-02-17 DIAGNOSIS — M069 Rheumatoid arthritis, unspecified: Secondary | ICD-10-CM | POA: Diagnosis not present

## 2021-02-17 DIAGNOSIS — M4802 Spinal stenosis, cervical region: Secondary | ICD-10-CM | POA: Diagnosis not present

## 2021-02-17 DIAGNOSIS — M47812 Spondylosis without myelopathy or radiculopathy, cervical region: Secondary | ICD-10-CM | POA: Diagnosis not present

## 2021-02-17 DIAGNOSIS — M1711 Unilateral primary osteoarthritis, right knee: Secondary | ICD-10-CM | POA: Diagnosis not present

## 2021-02-17 DIAGNOSIS — T796XXD Traumatic ischemia of muscle, subsequent encounter: Secondary | ICD-10-CM | POA: Diagnosis not present

## 2021-02-17 DIAGNOSIS — I10 Essential (primary) hypertension: Secondary | ICD-10-CM | POA: Diagnosis not present

## 2021-02-17 DIAGNOSIS — M4312 Spondylolisthesis, cervical region: Secondary | ICD-10-CM | POA: Diagnosis not present

## 2021-02-17 DIAGNOSIS — R1312 Dysphagia, oropharyngeal phase: Secondary | ICD-10-CM | POA: Diagnosis not present

## 2021-02-17 DIAGNOSIS — M5021 Other cervical disc displacement,  high cervical region: Secondary | ICD-10-CM | POA: Diagnosis not present

## 2021-02-24 DIAGNOSIS — I10 Essential (primary) hypertension: Secondary | ICD-10-CM | POA: Diagnosis not present

## 2021-02-24 DIAGNOSIS — M1711 Unilateral primary osteoarthritis, right knee: Secondary | ICD-10-CM | POA: Diagnosis not present

## 2021-02-24 DIAGNOSIS — M47812 Spondylosis without myelopathy or radiculopathy, cervical region: Secondary | ICD-10-CM | POA: Diagnosis not present

## 2021-02-24 DIAGNOSIS — M4802 Spinal stenosis, cervical region: Secondary | ICD-10-CM | POA: Diagnosis not present

## 2021-02-24 DIAGNOSIS — M069 Rheumatoid arthritis, unspecified: Secondary | ICD-10-CM | POA: Diagnosis not present

## 2021-02-24 DIAGNOSIS — T796XXD Traumatic ischemia of muscle, subsequent encounter: Secondary | ICD-10-CM | POA: Diagnosis not present

## 2021-02-24 DIAGNOSIS — M4312 Spondylolisthesis, cervical region: Secondary | ICD-10-CM | POA: Diagnosis not present

## 2021-02-24 DIAGNOSIS — R1312 Dysphagia, oropharyngeal phase: Secondary | ICD-10-CM | POA: Diagnosis not present

## 2021-02-24 DIAGNOSIS — M5021 Other cervical disc displacement,  high cervical region: Secondary | ICD-10-CM | POA: Diagnosis not present

## 2021-03-03 ENCOUNTER — Telehealth: Payer: Self-pay | Admitting: *Deleted

## 2021-03-03 NOTE — Telephone Encounter (Signed)
Called patient after being informed by Herbert Deaner, PT with [facility], that the patient has bedbugs in her home. Dana [name] states her son is helping and has called pest control and her home will be treated. They are considering either heat treatment or chemical treatment. Clair Gulling also reported the patient is still having diarrhea, 4 to 5 bowel movements a day. Patient is taking Dicyclomine 20 mg tablets, which is helping her diarrhea.

## 2021-03-23 ENCOUNTER — Telehealth: Payer: Self-pay | Admitting: *Deleted

## 2021-03-23 DIAGNOSIS — R1312 Dysphagia, oropharyngeal phase: Secondary | ICD-10-CM | POA: Diagnosis not present

## 2021-03-23 DIAGNOSIS — M4802 Spinal stenosis, cervical region: Secondary | ICD-10-CM | POA: Diagnosis not present

## 2021-03-23 DIAGNOSIS — I10 Essential (primary) hypertension: Secondary | ICD-10-CM | POA: Diagnosis not present

## 2021-03-23 DIAGNOSIS — M1711 Unilateral primary osteoarthritis, right knee: Secondary | ICD-10-CM | POA: Diagnosis not present

## 2021-03-23 DIAGNOSIS — M4312 Spondylolisthesis, cervical region: Secondary | ICD-10-CM | POA: Diagnosis not present

## 2021-03-23 DIAGNOSIS — M069 Rheumatoid arthritis, unspecified: Secondary | ICD-10-CM | POA: Diagnosis not present

## 2021-03-23 DIAGNOSIS — T796XXD Traumatic ischemia of muscle, subsequent encounter: Secondary | ICD-10-CM | POA: Diagnosis not present

## 2021-03-23 DIAGNOSIS — M5021 Other cervical disc displacement,  high cervical region: Secondary | ICD-10-CM | POA: Diagnosis not present

## 2021-03-23 DIAGNOSIS — M47812 Spondylosis without myelopathy or radiculopathy, cervical region: Secondary | ICD-10-CM | POA: Diagnosis not present

## 2021-03-23 NOTE — Telephone Encounter (Signed)
Left a message for the patient regarding constipation and diarrhea, reported by Clair Gulling, physical therapist, with [facility].

## 2021-03-24 ENCOUNTER — Telehealth: Payer: Self-pay | Admitting: *Deleted

## 2021-03-24 NOTE — Telephone Encounter (Signed)
Called patient in regard to constipation and loose stool problem Per Dr Melford Aase, patient needs to increase the fiber in her diet(suggested [employer]) and then she can take a stool softener, after the loose stools subside.

## 2021-04-02 DIAGNOSIS — M4802 Spinal stenosis, cervical region: Secondary | ICD-10-CM | POA: Diagnosis not present

## 2021-04-02 DIAGNOSIS — R1312 Dysphagia, oropharyngeal phase: Secondary | ICD-10-CM | POA: Diagnosis not present

## 2021-04-02 DIAGNOSIS — M5021 Other cervical disc displacement,  high cervical region: Secondary | ICD-10-CM | POA: Diagnosis not present

## 2021-04-02 DIAGNOSIS — I10 Essential (primary) hypertension: Secondary | ICD-10-CM | POA: Diagnosis not present

## 2021-04-02 DIAGNOSIS — T796XXD Traumatic ischemia of muscle, subsequent encounter: Secondary | ICD-10-CM | POA: Diagnosis not present

## 2021-04-02 DIAGNOSIS — M069 Rheumatoid arthritis, unspecified: Secondary | ICD-10-CM | POA: Diagnosis not present

## 2021-04-02 DIAGNOSIS — M4312 Spondylolisthesis, cervical region: Secondary | ICD-10-CM | POA: Diagnosis not present

## 2021-04-02 DIAGNOSIS — M1711 Unilateral primary osteoarthritis, right knee: Secondary | ICD-10-CM | POA: Diagnosis not present

## 2021-04-02 DIAGNOSIS — M47812 Spondylosis without myelopathy or radiculopathy, cervical region: Secondary | ICD-10-CM | POA: Diagnosis not present

## 2021-04-06 DIAGNOSIS — M0579 Rheumatoid arthritis with rheumatoid factor of multiple sites without organ or systems involvement: Secondary | ICD-10-CM | POA: Diagnosis not present

## 2021-04-09 DIAGNOSIS — M069 Rheumatoid arthritis, unspecified: Secondary | ICD-10-CM | POA: Diagnosis not present

## 2021-04-09 DIAGNOSIS — R1312 Dysphagia, oropharyngeal phase: Secondary | ICD-10-CM | POA: Diagnosis not present

## 2021-04-09 DIAGNOSIS — M5021 Other cervical disc displacement,  high cervical region: Secondary | ICD-10-CM | POA: Diagnosis not present

## 2021-04-09 DIAGNOSIS — T796XXD Traumatic ischemia of muscle, subsequent encounter: Secondary | ICD-10-CM | POA: Diagnosis not present

## 2021-04-09 DIAGNOSIS — M4802 Spinal stenosis, cervical region: Secondary | ICD-10-CM | POA: Diagnosis not present

## 2021-04-09 DIAGNOSIS — I10 Essential (primary) hypertension: Secondary | ICD-10-CM | POA: Diagnosis not present

## 2021-04-09 DIAGNOSIS — M4312 Spondylolisthesis, cervical region: Secondary | ICD-10-CM | POA: Diagnosis not present

## 2021-04-09 DIAGNOSIS — M47812 Spondylosis without myelopathy or radiculopathy, cervical region: Secondary | ICD-10-CM | POA: Diagnosis not present

## 2021-04-09 DIAGNOSIS — M1711 Unilateral primary osteoarthritis, right knee: Secondary | ICD-10-CM | POA: Diagnosis not present

## 2021-04-13 NOTE — Progress Notes (Deleted)
MEDICARE ANNUAL WELLNESS VISIT AND FOLLOW UP  Assessment:   Dana [name] was seen today for follow-up and medicare wellness.  Diagnoses and all orders for this visit:  Encounter for Medicare annual wellness exam Yearly  Atherosclerosis of aorta [facility]) Per CT 2021 Control blood pressure, cholesterol, glucose, increase exercise.   Essential hypertension Doing well continue Current regimen Will continue to monitor Low sodium diet  Hyperlipidemia, mixed Off of pravastatin following CK elevation *** Discussed dietary and exercise modifications Check lipid panel   Hypothyroidism, unspecified type continue medications the same pending lab results reminded to take on an empty stomach 30-32mins before food.  check TSH level  Tremor, essential Taking INderol and valium for this Doing well Managing, no complications at this time.  Other abnormal glucose; hx of prediabetes Discussed dietary and exercise modifications Will monitor A1c annually or q71m if elevated  Osteopenia of left hip Continue Calcium and Vit D supplimentation DEXA ***  Vitamin D deficiency Continue supplimentation  Non-seasonal allergic rhinitis, unspecified trigger Taking singular Doing well at this time  Medication management CBC, CMP/GFR, mangesium   Anemia Normocytic; monitor   Over 40 minutes of exam, counseling, chart review and critical decision making was performed Future Appointments  Date Time Provider [place]  04/14/2021 11:00 AM Liane Comber, NP [facility] None  10/21/2021 10:00 AM Unk Pinto, MD [facility] None  04/14/2022 11:00 AM Liane Comber, NP [facility] None     Plan:   During the course of the visit the patient was educated and counseled about appropriate screening and preventive services including:    Pneumococcal vaccine   Prevnar 13  Influenza vaccine  Td vaccine  Screening electrocardiogram  Bone densitometry screening  Colorectal cancer  screening  Diabetes screening  Glaucoma screening  Nutrition counseling   Advanced directives: requested   Subjective:  Dana [name] is a 80 y.o. female who presents for Medicare Annual Wellness Visit and 3 month follow up for HTN, HLD, Hypothyroidism, Pre-Diabetes, Vitamin D Defciency, RA, and hereditary essential tremor.  She was hospitalized in Jan 2022 after she fell at home and was found several hours later by family; had rhabdo with CPK ^^ 1716 which has since resolved following IVF; incidentally + covid 19 without clear sx; questionable UTI that was treated. Had R knee contusion with suspected hemarthrosis, was recommended conservative interventions only and to follow up with Dr. Norm Salt. Did complete PT in home for recovery ***  Lab Results  Component Value Date   CKTOTAL 56 12/22/2020     She is following with [facility] for cryo for precancerous lesions.   Patient diagnosed with RA in 2001 and stable on MTX and plaquenil and follows with [facility]. She has an essential tremor and taking propanolol for this 80mg  TID, also valium PRN.   BMI is There is no height or weight on file to calculate BMI., she has not been working on diet and exercise, not exercising much due to covid 19 and cold weather.  Wt Readings from Last 3 Encounters:  01/11/21 123 lb 9.6 oz (56.1 kg)  12/22/20 131 lb (59.4 kg)  12/09/20 133 lb (60.3 kg)   She has had elevated blood pressure since 1998. Her blood pressure has been controlled at home, today their BP is   She does not workout. She denies chest pain, shortness of breath, dizziness.   She has aortic atherosclerosis per CT 08/2020.   She is on cholesterol medication (pravastatin 40 mg daily ***) and denies myalgias. Her  cholesterol is at goal. The cholesterol last visit was:   Lab Results  Component Value Date   CHOL 148 10/06/2020   HDL 61 10/06/2020   LDLCALC 69 10/06/2020   TRIG 95 10/06/2020   CHOLHDL 2.4 10/06/2020    She has had brief intermittent hx prediabetes since 2012 (5.8% in 2015). She has been working on diet and exercise for glucose management, and denies hyperglycemia, hypoglycemia , increased appetite, nausea, paresthesia of the feet, polydipsia and polyuria. Last A1C in the office was:  Lab Results  Component Value Date   HGBA1C 5.2 10/06/2020   She is on thyroid medication. Her medication was not changed last visit.  *** Lab Results  Component Value Date   TSH 4.85 (H) 10/06/2020   Last GFR: Lab Results  Component Value Date   GFRNONAA 88 12/22/2020   Patient is on Vitamin D supplement.   Lab Results  Component Value Date   VD25OH 50 10/06/2020     She has chronic normocytic anemia; she is on P53 and folic acid supplements;  Lab Results  Component Value Date   WBC 5.0 12/22/2020   HGB 10.5 (L) 12/22/2020   HCT 31.7 (L) 12/22/2020   MCV 100.0 12/22/2020   PLT 332 12/22/2020     Medication Review: Current Outpatient Medications on File Prior to Visit  Medication Sig Dispense Refill  . acetaminophen (TYLENOL) 500 MG tablet Take 1,000 mg by mouth at bedtime.    Marland Kitchen amoxicillin (AMOXIL) 250 MG capsule Take 1 capsule 3 x /day with meals for infection 30 capsule 0  . Cholecalciferol (VITAMIN D) 125 MCG (5000 UT) CAPS Take 10,000 Units by mouth every evening.    . Cyanocobalamin (VITAMIN B 12 PO) Place 1 tablet under the tongue every evening.    . dicyclomine (BENTYL) 20 MG tablet Take  1 tablet  3 x /day  before Meals  for Cramping, Bloating , Nausea or Diarrhea 614 tablet 0  . folic acid (FOLVITE) 431 MCG tablet Take 1 tablet daily. (Patient taking differently: Take 800 mcg by mouth daily.) 90 tablet 1  . furosemide (LASIX) 40 MG tablet TAKE 1 TABLET 2  TIMES DAILY FOR FLUID  RETENTION AND ANKLE  SWELLING (Patient taking differently: Take 40 mg by mouth 2 (two) times daily. FOR FLUID  RETENTION AND ANKLE SWELLING) 180 tablet 3  . hydroxychloroquine (PLAQUENIL) 200 MG tablet Take  200 mg by mouth See admin instructions. Take one tablet (200 mcg) by mouth twice daily Monday thru Friday (skip Saturday and Sunday)    . levothyroxine (SYNTHROID) 100 MCG tablet Take      1 tablet       Daily       on an empty stomach with only water for 30 minutes & no Antacid meds, Calcium or Magnesium for 4 hours & avoid Biotin (Patient taking differently: Take 50-100 mcg by mouth See admin instructions. Take 1 tablet (100 mcg) by mouth daily on Monday thru Friday mornings; take 1/2 tablet (50 mcg) on Saturday and Sunday mornings. Take on an empty stomach with only water for 30 minutes & no Antacid meds, Calcium or Magnesium for 4 hours & avoid Biotin) 90 tablet 0  . methotrexate (RHEUMATREX) 2.5 MG tablet Take 12.5 mg by mouth See admin instructions. Take 5 tablets (12.5 mg) by mouth twice weekly - Sunday and Wednesday    . montelukast (SINGULAIR) 10 MG tablet TAKE 1 TABLET EVERY DAY 90 tablet 3  . Multiple Vitamin (MULTIVITAMIN  WITH MINERALS) TABS tablet Take 1 tablet by mouth every evening.    . ondansetron (ZOFRAN ODT) 8 MG disintegrating tablet 8mg  ODT q4 hours prn nausea (Patient taking differently: Take 8 mg by mouth every 4 (four) hours as needed for nausea or vomiting.) 30 tablet 3  . polyethylene glycol (MIRALAX / GLYCOLAX) 17 g packet Take 17 g by mouth daily. 14 each 0  . predniSONE (DELTASONE) 20 MG tablet 2 tablets daily for 3 days, 1 tablet daily for 4 days. 10 tablet 0  . propranolol (INDERAL) 80 MG tablet TAKE 1 TABLET BY MOUTH 3  TIMES DAILY (Patient taking differently: Take 80 mg by mouth 2 (two) times daily. For tremors) 270 tablet 3   No current facility-administered medications on file prior to visit.    Allergies  Allergen Reactions  . Codeine Nausea Only  . Mysoline [Primidone] Nausea Only and Other (See Comments)    Over-sedated    Current Problems (verified) Patient Active Problem List   Diagnosis Date Noted  . COVID-19 virus infection 12/09/2020 12/10/2020  .  Right knee pain 12/10/2020  . Diverticulosis 09/03/2020  . Aortic atherosclerosis ([place]) by CT scan 08/2020 08/17/2020  . Osteopenia 12/05/2018  . FHx: heart disease 09/03/2018  . Former smoker (quit 1985) 09/03/2018  . Overweight (BMI 25.0-29.9) 05/30/2018  . Generalized OA 09/02/2015  . Vitamin D deficiency 01/06/2014  . Hypertension   . Other abnormal glucose   . Rheumatoid arthritis ([place])   . Hereditary essential tremor   . Hypothyroidism   . Migraines   . Hyperlipidemia, mixed   . Chronic anemia     Screening Tests Immunization History  Administered Date(s) Administered  . DT (Pediatric) 02/05/2015  . Influenza Split 09/12/2013  . Influenza, High Dose Seasonal PF 10/28/2014, 08/18/2016, 11/22/2018, 12/09/2019, 10/06/2020  . Pneumococcal Conjugate-13 05/12/2015  . Pneumococcal-Unspecified 11/21/2006  . Td 11/22/2003  . Varicella 06/23/2011  . Zoster 06/23/2011   ***  Preventative care: Last colonoscopy: 2013, normal except mild diverticulosis of sigmoid; DONE Last mammogram: 08/2019 *** Last pap smear/pelvic exam: Hysterectomy   DEXA: 08/2018 L fem neck T-1.9  Prior vaccinations: TD or Tdap: 2016  Influenza: 09/2020 Pneumococcal: 2008 Prevnar13: 2016 Shingles/Zostavax: 2017 *** Covid 19: ***  Names of Other Physician/Practitioners you currently use: 1. [facility] here for primary care 2. Eye Exam, Dr. Ellie Lunch every six months, last 06/2019 3. Dentist, Due  Patient Care Team: Unk Pinto, MD as PCP - General (Internal Medicine) Roetta Sessions, MD as Referring Physician (Internal Medicine)  SURGICAL HISTORY She  has a past surgical history that includes Abdominal hysterectomy; Tubal ligation; Cataract extraction, bilateral; Cholecystectomy (08/31/11); and Colonoscopy. FAMILY HISTORY Her family history includes Diabetes in her mother and sister; Heart attack (age of onset: 49) in her son; Heart disease in her  father, maternal aunt, maternal uncle, and mother. SOCIAL HISTORY She  reports that she quit smoking about 36 years ago. Her smoking use included cigarettes. She has never used smokeless tobacco. She reports that she does not drink alcohol and does not use drugs.   MEDICARE WELLNESS OBJECTIVES: Physical activity:   Cardiac risk factors:   Depression/mood screen:   Depression screen [facility] 2/9 12/26/2020  Decreased Interest 0  Down, Depressed, Hopeless 0  PHQ - 2 Score 0    ADLs:  In your present state of health, do you have any difficulty performing the following activities: 12/10/2020 10/06/2020  Hearing? N N  Vision? N N  Difficulty  concentrating or making decisions? N N  Walking or climbing stairs? Y N  Dressing or bathing? N N  Doing errands, shopping? N N  Some recent data might be hidden     Cognitive Testing  Alert? Yes  Normal Appearance?Yes  Oriented to person? Yes  Place? Yes   Time? Yes  Recall of three objects?  Yes  Can perform simple calculations? Yes  Displays appropriate judgment?Yes  Can read the correct time from a watch face?Yes  EOL planning:    Review of Systems  Constitutional: Negative for malaise/fatigue and weight loss.  HENT: Negative for hearing loss and tinnitus.   Eyes: Negative for blurred vision and double vision.  Respiratory: Negative for cough, sputum production, shortness of breath and wheezing.   Cardiovascular: Negative for chest pain, palpitations, orthopnea, claudication, leg swelling and PND.  Gastrointestinal: Negative for abdominal pain, blood in stool, constipation, diarrhea, heartburn, melena, nausea and vomiting.  Genitourinary: Negative.   Musculoskeletal: Negative for falls, joint pain and myalgias.  Skin: Negative for rash.  Neurological: Positive for tremors (stable). Negative for dizziness, tingling, sensory change, weakness and headaches.  Endo/Heme/Allergies: Negative for polydipsia.  Psychiatric/Behavioral: Negative.   Negative for depression, memory loss, substance abuse and suicidal ideas. The patient is not nervous/anxious and does not have insomnia.   All other systems reviewed and are negative.    Objective:     There were no vitals filed for this visit. There is no height or weight on file to calculate BMI.  General appearance: alert, no distress, WD/WN, female HEENT: normocephalic, sclerae anicteric, TMs pearly, nares patent, no discharge or erythema, pharynx normal Oral cavity: MMM, no lesions Neck: supple, no lymphadenopathy, no thyromegaly, no masses Heart: RRR, normal S1, S2, no murmurs Lungs: CTA bilaterally, no wheezes, rhonchi, or rales Abdomen: +bs, soft, non tender, non distended, no masses, no hepatomegaly, no splenomegaly Musculoskeletal: nontender, no swelling, no obvious deformity Extremities: no edema, no cyanosis, no clubbing Pulses: 2+ symmetric, upper and lower extremities, normal cap refill Neurological: alert, oriented x 3, CN2-12 intact, strength normal upper extremities and lower extremities, sensation normal throughout, DTRs 2+ throughout, no cerebellar signs, gait slow steady, subtle head and vocal tremor Psychiatric: normal affect, behavior normal, pleasant   Medicare Attestation I have personally reviewed: The patient's medical and social history Their use of alcohol, tobacco or illicit drugs Their current medications and supplements The patient's functional ability including ADLs,fall risks, home safety risks, cognitive, and hearing and visual impairment Diet and physical activities Evidence for depression or mood disorders  The patient's weight, height, BMI, and visual acuity have been recorded in the chart.  I have made referrals, counseling, and provided education to the patient based on review of the above and I have provided the patient with a written personalized care plan for preventive services.     Dana Ribas, NP   04/13/2021

## 2021-04-14 ENCOUNTER — Ambulatory Visit: Payer: Medicare HMO | Admitting: Adult Health

## 2021-04-14 DIAGNOSIS — M1711 Unilateral primary osteoarthritis, right knee: Secondary | ICD-10-CM | POA: Diagnosis not present

## 2021-04-14 DIAGNOSIS — I10 Essential (primary) hypertension: Secondary | ICD-10-CM

## 2021-04-14 DIAGNOSIS — D649 Anemia, unspecified: Secondary | ICD-10-CM

## 2021-04-14 DIAGNOSIS — G250 Essential tremor: Secondary | ICD-10-CM

## 2021-04-14 DIAGNOSIS — G43809 Other migraine, not intractable, without status migrainosus: Secondary | ICD-10-CM

## 2021-04-14 DIAGNOSIS — M47812 Spondylosis without myelopathy or radiculopathy, cervical region: Secondary | ICD-10-CM | POA: Diagnosis not present

## 2021-04-14 DIAGNOSIS — E663 Overweight: Secondary | ICD-10-CM

## 2021-04-14 DIAGNOSIS — M85852 Other specified disorders of bone density and structure, left thigh: Secondary | ICD-10-CM

## 2021-04-14 DIAGNOSIS — Z87891 Personal history of nicotine dependence: Secondary | ICD-10-CM

## 2021-04-14 DIAGNOSIS — E559 Vitamin D deficiency, unspecified: Secondary | ICD-10-CM

## 2021-04-14 DIAGNOSIS — Z0000 Encounter for general adult medical examination without abnormal findings: Secondary | ICD-10-CM

## 2021-04-14 DIAGNOSIS — I700 Atherosclerosis of aorta: Secondary | ICD-10-CM

## 2021-04-14 DIAGNOSIS — M4802 Spinal stenosis, cervical region: Secondary | ICD-10-CM | POA: Diagnosis not present

## 2021-04-14 DIAGNOSIS — M069 Rheumatoid arthritis, unspecified: Secondary | ICD-10-CM

## 2021-04-14 DIAGNOSIS — E039 Hypothyroidism, unspecified: Secondary | ICD-10-CM

## 2021-04-14 DIAGNOSIS — T796XXD Traumatic ischemia of muscle, subsequent encounter: Secondary | ICD-10-CM | POA: Diagnosis not present

## 2021-04-14 DIAGNOSIS — E782 Mixed hyperlipidemia: Secondary | ICD-10-CM

## 2021-04-14 DIAGNOSIS — M4312 Spondylolisthesis, cervical region: Secondary | ICD-10-CM | POA: Diagnosis not present

## 2021-04-14 DIAGNOSIS — M5021 Other cervical disc displacement,  high cervical region: Secondary | ICD-10-CM | POA: Diagnosis not present

## 2021-04-14 DIAGNOSIS — R7309 Other abnormal glucose: Secondary | ICD-10-CM

## 2021-04-14 DIAGNOSIS — K5790 Diverticulosis of intestine, part unspecified, without perforation or abscess without bleeding: Secondary | ICD-10-CM

## 2021-04-14 DIAGNOSIS — R1312 Dysphagia, oropharyngeal phase: Secondary | ICD-10-CM | POA: Diagnosis not present

## 2021-04-15 ENCOUNTER — Ambulatory Visit: Payer: Medicare HMO | Admitting: Internal Medicine

## 2021-04-15 ENCOUNTER — Telehealth: Payer: Self-pay

## 2021-04-15 NOTE — Telephone Encounter (Signed)
Patient states that she woke up and her ear was "closed up". It hasn't gotten better throughout the day. No appointments available and she missed her's yesterday, requesting something be called in.

## 2021-04-29 DIAGNOSIS — L570 Actinic keratosis: Secondary | ICD-10-CM | POA: Diagnosis not present

## 2021-04-29 DIAGNOSIS — L218 Other seborrheic dermatitis: Secondary | ICD-10-CM | POA: Diagnosis not present

## 2021-04-29 DIAGNOSIS — K130 Diseases of lips: Secondary | ICD-10-CM | POA: Diagnosis not present

## 2021-04-29 DIAGNOSIS — L578 Other skin changes due to chronic exposure to nonionizing radiation: Secondary | ICD-10-CM | POA: Diagnosis not present

## 2021-04-29 DIAGNOSIS — D0472 Carcinoma in situ of skin of left lower limb, including hip: Secondary | ICD-10-CM | POA: Diagnosis not present

## 2021-05-06 ENCOUNTER — Ambulatory Visit (INDEPENDENT_AMBULATORY_CARE_PROVIDER_SITE_OTHER): Payer: Medicare HMO | Admitting: Adult Health

## 2021-05-06 ENCOUNTER — Encounter: Payer: Self-pay | Admitting: Adult Health

## 2021-05-06 ENCOUNTER — Other Ambulatory Visit: Payer: Self-pay

## 2021-05-06 VITALS — BP 110/64 | HR 60 | Temp 97.2°F | Wt 124.0 lb

## 2021-05-06 DIAGNOSIS — K5790 Diverticulosis of intestine, part unspecified, without perforation or abscess without bleeding: Secondary | ICD-10-CM

## 2021-05-06 DIAGNOSIS — Z0001 Encounter for general adult medical examination with abnormal findings: Secondary | ICD-10-CM

## 2021-05-06 DIAGNOSIS — M159 Polyosteoarthritis, unspecified: Secondary | ICD-10-CM

## 2021-05-06 DIAGNOSIS — R6889 Other general symptoms and signs: Secondary | ICD-10-CM

## 2021-05-06 DIAGNOSIS — E559 Vitamin D deficiency, unspecified: Secondary | ICD-10-CM | POA: Diagnosis not present

## 2021-05-06 DIAGNOSIS — M069 Rheumatoid arthritis, unspecified: Secondary | ICD-10-CM | POA: Diagnosis not present

## 2021-05-06 DIAGNOSIS — E039 Hypothyroidism, unspecified: Secondary | ICD-10-CM | POA: Diagnosis not present

## 2021-05-06 DIAGNOSIS — G43809 Other migraine, not intractable, without status migrainosus: Secondary | ICD-10-CM | POA: Diagnosis not present

## 2021-05-06 DIAGNOSIS — M85852 Other specified disorders of bone density and structure, left thigh: Secondary | ICD-10-CM

## 2021-05-06 DIAGNOSIS — G250 Essential tremor: Secondary | ICD-10-CM | POA: Diagnosis not present

## 2021-05-06 DIAGNOSIS — Z6821 Body mass index (BMI) 21.0-21.9, adult: Secondary | ICD-10-CM

## 2021-05-06 DIAGNOSIS — I10 Essential (primary) hypertension: Secondary | ICD-10-CM | POA: Diagnosis not present

## 2021-05-06 DIAGNOSIS — Z85828 Personal history of other malignant neoplasm of skin: Secondary | ICD-10-CM | POA: Diagnosis not present

## 2021-05-06 DIAGNOSIS — I700 Atherosclerosis of aorta: Secondary | ICD-10-CM | POA: Diagnosis not present

## 2021-05-06 DIAGNOSIS — Z1231 Encounter for screening mammogram for malignant neoplasm of breast: Secondary | ICD-10-CM

## 2021-05-06 DIAGNOSIS — Z87891 Personal history of nicotine dependence: Secondary | ICD-10-CM

## 2021-05-06 DIAGNOSIS — E782 Mixed hyperlipidemia: Secondary | ICD-10-CM

## 2021-05-06 DIAGNOSIS — Z0000 Encounter for general adult medical examination without abnormal findings: Secondary | ICD-10-CM

## 2021-05-06 DIAGNOSIS — R7309 Other abnormal glucose: Secondary | ICD-10-CM

## 2021-05-06 NOTE — Patient Instructions (Signed)
    HOW TO SCHEDULE A MAMMOGRAM  [facility]  7 a.m.-6:30 p.m., Monday 7 a.m.-5 p.m., Tuesday-Friday Schedule an appointment by calling [phone].

## 2021-05-06 NOTE — Progress Notes (Signed)
MEDICARE ANNUAL WELLNESS VISIT AND FOLLOW UP  Assessment:   Dana [name] was seen today for follow-up and medicare wellness.  Diagnoses and all orders for this visit:  Encounter for Medicare annual wellness exam Yearly Schedule mmg, DEXA  Atherosclerosis of aorta ([place]) Per CT 2021 Control blood pressure, cholesterol, glucose, increase exercise.   Essential hypertension Doing well continue Current regimen Will continue to monitor Low sodium diet  Hyperlipidemia, mixed Off of pravastatin in the past year with controlled values Discussed dietary and exercise modifications Check lipid panel   Hypothyroidism, unspecified type continue medications the same pending lab results reminded to take on an empty stomach 30-82mins before food.  check TSH level  Tremor, essential Taking Inderol and valium for this Doing well Managing, no complications at this time.  Other abnormal glucose; hx of prediabetes Discussed dietary and exercise modifications Will monitor A1c annually or q70m if elevated  Osteopenia of left hip Continue Calcium and Vit D supplimentation DEXA - ordered to schedule with mmg at breast center  Vitamin D deficiency Continue supplimentation  Non-seasonal allergic rhinitis, unspecified trigger Taking singular Doing well at this time  Medication management CBC, CMP/GFR, mangesium   Rheumatoid Arthritis ([place]) [place] rheum is managing; doing well on current meds  Hiscory of SCC/BCC Dr. Ledell Peoples office follows   Anemia Normocytic; normal iron/B12, monitor   Over 40 minutes of exam, counseling, chart review and critical decision making was performed Future Appointments  Date Time Provider Ong  10/21/2021 10:00 AM Unk Pinto, MD [facility] None  04/14/2022 11:00 AM Liane Comber, NP [facility] None  05/09/2022  2:30 PM Liane Comber, NP [facility] None     Plan:   During the course of the visit the patient was educated and  counseled about appropriate screening and preventive services including:   Pneumococcal vaccine  Prevnar 13 Influenza vaccine Td vaccine Screening electrocardiogram Bone densitometry screening Colorectal cancer screening Diabetes screening Glaucoma screening Nutrition counseling  Advanced directives: requested   Subjective:  Dana [name] is a 80 y.o. female who presents for Medicare Annual Wellness Visit and 3 month follow up for HTN, HLD, Hypothyroidism, Pre-Diabetes, Vitamin D Defciency, RA, and hereditary essential tremor.  She was hospitalized in Jan 2022 after she fell at home and was found several hours later by family; had rhabdo with CPK ^^ 1716 which has since resolved following IVF; incidentally + covid 19 without clear sx; questionable UTI that was treated. Had R knee contusion with suspected hemarthrosis, was recommended conservative interventions only and to follow up with Dr. Norm Salt. Did complete PT in home for recovery, reports doing well since, continues with home balance exercises. Uses cane and denies falls since. Daughter checks on her regularly.   Lab Results  Component Value Date   CKTOTAL 56 12/22/2020     She is following with Dr. Allyson Sabal for cryo for precancerous lesions.   Patient diagnosed with RA in 2001 and stable on MTX with folica acid and plaquenil and follows with [facility].  She has an essential tremor and taking propanolol for this 80mg  TID, also valium PRN.   BMI is Body mass index is 21.97 kg/m., she has not been working on diet and exercise, she does PT/strengthening exercises regularly since fall.  Wt Readings from Last 3 Encounters:  05/06/21 124 lb (56.2 kg)  01/11/21 123 lb 9.6 oz (56.1 kg)  12/22/20 131 lb (59.4 kg)   She has had elevated blood pressure since 1998. Her blood pressure has been controlled at  home, today their BP is BP: 110/64 She does not workout. She denies chest pain, shortness of breath, dizziness.   She has  aortic atherosclerosis per CT 08/2020.   She is not on cholesterol medication (she stopped taking pravastatin 40 mg daily 1 year ago) and denies myalgias. Her cholesterol is at goal. The cholesterol last visit was:   Lab Results  Component Value Date   CHOL 148 10/06/2020   HDL 61 10/06/2020   LDLCALC 69 10/06/2020   TRIG 95 10/06/2020   CHOLHDL 2.4 10/06/2020   She has had brief intermittent hx prediabetes since 2012 (5.8% in 2015). She has been working on diet and exercise for glucose management, and denies hyperglycemia, hypoglycemia , increased appetite, nausea, paresthesia of the feet, polydipsia and polyuria. Last A1C in the office was:  Lab Results  Component Value Date   HGBA1C 5.2 10/06/2020   She is on thyroid medication. Her medication was changed last visit.  She is taking 100 mcg 1 tab daily except 1/2 tab on M, F.  Lab Results  Component Value Date   TSH 4.85 (H) 10/06/2020   Last GFR: Lab Results  Component Value Date   GFRNONAA 88 12/22/2020   Patient is on Vitamin D supplement.   Lab Results  Component Value Date   VD25OH 50 10/06/2020     She has chronic normocytic anemia; she is on D63 and folic acid supplements;  Lab Results  Component Value Date   WBC 5.0 12/22/2020   HGB 10.5 (L) 12/22/2020   HCT 31.7 (L) 12/22/2020   MCV 100.0 12/22/2020   PLT 332 12/22/2020      Medication Review: Current Outpatient Medications on File Prior to Visit  Medication Sig Dispense Refill   acetaminophen (TYLENOL) 500 MG tablet Take 1,000 mg by mouth at bedtime.     Cholecalciferol (VITAMIN D) 125 MCG (5000 UT) CAPS Take 10,000 Units by mouth every evening.     Cyanocobalamin (VITAMIN B 12 PO) Place 1 tablet under the tongue every evening.     dicyclomine (BENTYL) 20 MG tablet Take  1 tablet  3 x /day  before Meals  for Cramping, Bloating , Nausea or Diarrhea 875 tablet 0   folic acid (FOLVITE) 643 MCG tablet Take 1 tablet daily. (Patient taking differently: Take 800  mcg by mouth daily.) 90 tablet 1   furosemide (LASIX) 40 MG tablet TAKE 1 TABLET 2  TIMES DAILY FOR FLUID  RETENTION AND ANKLE  SWELLING (Patient taking differently: Take 40 mg by mouth 2 (two) times daily. FOR FLUID  RETENTION AND ANKLE SWELLING) 180 tablet 3   hydroxychloroquine (PLAQUENIL) 200 MG tablet Take 200 mg by mouth See admin instructions. Take one tablet (200 mcg) by mouth twice daily Monday thru Friday (skip Saturday and Sunday)     levothyroxine (SYNTHROID) 100 MCG tablet Take      1 tablet       Daily       on an empty stomach with only water for 30 minutes & no Antacid meds, Calcium or Magnesium for 4 hours & avoid Biotin (Patient taking differently: Take 50-100 mcg by mouth See admin instructions. Take 1 tablet (100 mcg) by mouth daily on Monday thru Friday mornings; take 1/2 tablet (50 mcg) on Saturday and Sunday mornings. Take on an empty stomach with only water for 30 minutes & no Antacid meds, Calcium or Magnesium for 4 hours & avoid Biotin) 90 tablet 0   methotrexate (RHEUMATREX) 2.5 MG  tablet Take 12.5 mg by mouth See admin instructions. Take 5 tablets (12.5 mg) by mouth twice weekly - Sunday and Wednesday     montelukast (SINGULAIR) 10 MG tablet TAKE 1 TABLET EVERY DAY 90 tablet 3   Multiple Vitamin (MULTIVITAMIN WITH MINERALS) TABS tablet Take 1 tablet by mouth every evening.     ondansetron (ZOFRAN ODT) 8 MG disintegrating tablet 8mg  ODT q4 hours prn nausea (Patient taking differently: Take 8 mg by mouth every 4 (four) hours as needed for nausea or vomiting.) 30 tablet 3   polyethylene glycol (MIRALAX / GLYCOLAX) 17 g packet Take 17 g by mouth daily. 14 each 0   propranolol (INDERAL) 80 MG tablet TAKE 1 TABLET BY MOUTH 3  TIMES DAILY (Patient taking differently: Take 80 mg by mouth 2 (two) times daily. For tremors) 270 tablet 3   amoxicillin (AMOXIL) 250 MG capsule Take 1 capsule 3 x /day with meals for infection 30 capsule 0   predniSONE (DELTASONE) 20 MG tablet 2 tablets daily  for 3 days, 1 tablet daily for 4 days. 10 tablet 0   No current facility-administered medications on file prior to visit.    Allergies  Allergen Reactions   Codeine Nausea Only   Mysoline [Primidone] Nausea Only and Other (See Comments)    Over-sedated   Sulfa Antibiotics Itching    Current Problems (verified) Patient Active Problem List   Diagnosis Date Noted   History of basal cell carcinoma (BCC) 05/06/2021   History of SCC (squamous cell carcinoma) of skin 05/06/2021   COVID-19 virus infection 12/09/2020 12/10/2020   Right knee pain 12/10/2020   Diverticulosis 09/03/2020   Aortic atherosclerosis ([place]) by CT scan 08/2020 08/17/2020   Osteopenia 12/05/2018   FHx: heart disease 09/03/2018   Former smoker (quit 1985) 09/03/2018   Overweight (BMI 25.0-29.9) 05/30/2018   Generalized OA 09/02/2015   Vitamin D deficiency 01/06/2014   Hypertension    Other abnormal glucose    Rheumatoid arthritis ([place])    Hereditary essential tremor    Hypothyroidism    Migraines    Hyperlipidemia, mixed    Chronic anemia     Screening Tests Immunization History  Administered Date(s) Administered   DT (Pediatric) 02/05/2015   Influenza Split 09/12/2013   Influenza, High Dose Seasonal PF 10/28/2014, 08/18/2016, 11/22/2018, 12/09/2019, 10/06/2020   Pneumococcal Conjugate-13 05/12/2015   Pneumococcal-Unspecified 11/21/2006   Td 11/22/2003   Varicella 06/23/2011   Zoster, Live 06/23/2011     Preventative care: Last colonoscopy: 2013, normal except mild diverticulosis of sigmoid; DONE Last mammogram: 08/2019 - ordered to schedule at breast center Last pap smear/pelvic exam: Hysterectomy   DEXA: 08/2018 L fem neck T-1.9 - ordered to schedule   Prior vaccinations: TD or Tdap: 2016  Influenza: 09/2020 Pneumococcal: 2008 Prevnar13: 2016 Shingles/Zostavax: 2017 declines shingrix  Covid 19: declines   Names of Other Physician/Practitioners you currently use: 1. [facility] here for primary care 2. Eye Exam, Dr. Ellie Lunch every six months, has upcoming 06/2021 3. Dentist, several years ago, overdue, reminded to scheduel  Patient Care Team: Unk Pinto, MD as PCP - General (Internal Medicine) Roetta Sessions, MD as Referring Physician (Internal Medicine)  SURGICAL HISTORY She  has a past surgical history that includes Abdominal hysterectomy; Tubal ligation; Cataract extraction, bilateral; Cholecystectomy (08/31/11); and Colonoscopy. FAMILY HISTORY Her family history includes Diabetes in her mother and sister; Heart attack (age of onset: 26) in her son; Heart disease in her father, maternal aunt,  maternal uncle, and mother. SOCIAL HISTORY She  reports that she quit smoking about 36 years ago. Her smoking use included cigarettes. She has never used smokeless tobacco. She reports that she does not drink alcohol and does not use drugs.   MEDICARE WELLNESS OBJECTIVES: Physical activity: Current Exercise Habits: Home exercise routine, Type of exercise: strength training/weights;Other - see comments;stretching (balance exercises), Time (Minutes): 30, Frequency (Times/Week): 7, Weekly Exercise (Minutes/Week): 210, Intensity: Mild, Exercise limited by: orthopedic condition(s) Cardiac risk factors: Cardiac Risk Factors include: advanced age (>58men, >59 women);dyslipidemia;hypertension;smoking/ tobacco exposure Depression/mood screen:   Depression screen [facility] 2/9 05/06/2021  Decreased Interest 0  Down, Depressed, Hopeless 1  PHQ - 2 Score 1    ADLs:  In your present state of health, do you have any difficulty performing the following activities: 05/06/2021 12/10/2020  Hearing? N N  Vision? N N  Difficulty concentrating or making decisions? N N  Walking or climbing stairs? N Y  Comment uses cane, manages in home -  Dressing or bathing? N N  Doing errands, shopping? N N  Some recent data might be hidden     Cognitive  Testing  Alert? Yes  Normal Appearance?Yes  Oriented to person? Yes  Place? Yes   Time? Yes  Recall of three objects?  Yes  Can perform simple calculations? Yes  Displays appropriate judgment?Yes  Can read the correct time from a watch face?Yes  EOL planning: Does Patient Have a Medical Advance Directive?: No Would patient like information on creating a medical advance directive?: Yes (MAU/Ambulatory/Procedural Areas - Information given)  Review of Systems  Constitutional:  Negative for malaise/fatigue and weight loss.  HENT:  Negative for hearing loss and tinnitus.   Eyes:  Negative for blurred vision and double vision.  Respiratory:  Negative for cough, sputum production, shortness of breath and wheezing.   Cardiovascular:  Negative for chest pain, palpitations, orthopnea, claudication, leg swelling and PND.  Gastrointestinal:  Negative for abdominal pain, blood in stool, constipation, diarrhea, heartburn, melena, nausea and vomiting.  Genitourinary: Negative.   Musculoskeletal:  Negative for falls, joint pain and myalgias.  Skin:  Negative for rash.  Neurological:  Positive for tremors (stable). Negative for dizziness, tingling, sensory change, weakness and headaches.  Endo/Heme/Allergies:  Negative for polydipsia.  Psychiatric/Behavioral: Negative.  Negative for depression, memory loss, substance abuse and suicidal ideas. The patient is not nervous/anxious and does not have insomnia.   All other systems reviewed and are negative.   Objective:     Today's Vitals   05/06/21 1442  BP: 110/64  Pulse: 60  Temp: (!) 97.2 F (36.2 C)  SpO2: 94%  Weight: 124 lb (56.2 kg)   Body mass index is 21.97 kg/m.  General appearance: alert, no distress, WD/WN, female HEENT: normocephalic, sclerae anicteric, TMs pearly, nares patent, no discharge or erythema, pharynx normal Oral cavity: MMM, no lesions Neck: supple, no lymphadenopathy, no thyromegaly, no masses Heart: RRR, normal S1,  S2, no murmurs Lungs: CTA bilaterally, no wheezes, rhonchi, or rales Abdomen: +bs, soft, non tender, non distended, no masses, no hepatomegaly, no splenomegaly Musculoskeletal: nontender, no swelling, no obvious deformity Extremities: no edema, no cyanosis, no clubbing Pulses: 2+ symmetric, upper and lower extremities, normal cap refill Neurological: alert, oriented x 3, CN2-12 intact, strength normal upper extremities and lower extremities, sensation normal throughout, DTRs 2+ throughout, no cerebellar signs, gait slow steady, subtle head and vocal tremor Psychiatric: normal affect, behavior normal, pleasant   Medicare Attestation I have personally reviewed: The  patient's medical and social history Their use of alcohol, tobacco or illicit drugs Their current medications and supplements The patient's functional ability including ADLs,fall risks, home safety risks, cognitive, and hearing and visual impairment Diet and physical activities Evidence for depression or mood disorders  The patient's weight, height, BMI, and visual acuity have been recorded in the chart.  I have made referrals, counseling, and provided education to the patient based on review of the above and I have provided the patient with a written personalized care plan for preventive services.     Dana Ribas, NP   05/06/2021

## 2021-05-07 LAB — TSH: TSH: 2.53 mIU/L (ref 0.40–4.50)

## 2021-05-07 LAB — COMPLETE METABOLIC PANEL WITH GFR
AG Ratio: 1.8 (calc) (ref 1.0–2.5)
ALT: 13 U/L (ref 6–29)
AST: 23 U/L (ref 10–35)
Albumin: 3.8 g/dL (ref 3.6–5.1)
Alkaline phosphatase (APISO): 80 U/L (ref 37–153)
BUN: 11 mg/dL (ref 7–25)
CO2: 32 mmol/L (ref 20–32)
Calcium: 9.2 mg/dL (ref 8.6–10.4)
Chloride: 104 mmol/L (ref 98–110)
Creat: 0.76 mg/dL (ref 0.60–0.88)
GFR, Est African American: 85 mL/min/{1.73_m2} (ref >=60)
GFR, Est Non African American: 73 mL/min/{1.73_m2} (ref >=60)
Globulin: 2.1 g/dL (calc) (ref 1.9–3.7)
Glucose, Bld: 85 mg/dL (ref 65–99)
Potassium: 4.4 mmol/L (ref 3.5–5.3)
Sodium: 142 mmol/L (ref 135–146)
Total Bilirubin: 0.5 mg/dL (ref 0.2–1.2)
Total Protein: 5.9 g/dL — ABNORMAL LOW (ref 6.1–8.1)

## 2021-05-07 LAB — LIPID PANEL
Cholesterol: 139 mg/dL (ref <200)
HDL: 55 mg/dL (ref >=50)
LDL Cholesterol (Calc): 67 mg/dL (calc)
Non-HDL Cholesterol (Calc): 84 mg/dL (calc) (ref <130)
Total CHOL/HDL Ratio: 2.5 (calc) (ref <5.0)
Triglycerides: 91 mg/dL (ref <150)

## 2021-05-07 LAB — CBC WITH DIFFERENTIAL/PLATELET
Absolute Monocytes: 233 cells/uL (ref 200–950)
Basophils Absolute: 41 cells/uL (ref 0–200)
Basophils Relative: 1.1 %
Eosinophils Absolute: 52 cells/uL (ref 15–500)
Eosinophils Relative: 1.4 %
HCT: 33 % — ABNORMAL LOW (ref 35.0–45.0)
Hemoglobin: 10.4 g/dL — ABNORMAL LOW (ref 11.7–15.5)
Lymphs Abs: 1717 cells/uL (ref 850–3900)
MCH: 30.4 pg (ref 27.0–33.0)
MCHC: 31.5 g/dL — ABNORMAL LOW (ref 32.0–36.0)
MCV: 96.5 fL (ref 80.0–100.0)
MPV: 9.8 fL (ref 7.5–12.5)
Monocytes Relative: 6.3 %
Neutro Abs: 1658 cells/uL (ref 1500–7800)
Neutrophils Relative %: 44.8 %
Platelets: 272 10*3/uL (ref 140–400)
RBC: 3.42 10*6/uL — ABNORMAL LOW (ref 3.80–5.10)
RDW: 15.8 % — ABNORMAL HIGH (ref 11.0–15.0)
Total Lymphocyte: 46.4 %
WBC: 3.7 10*3/uL — ABNORMAL LOW (ref 3.8–10.8)

## 2021-05-07 LAB — MAGNESIUM: Magnesium: 2.3 mg/dL (ref 1.5–2.5)

## 2021-05-07 LAB — VITAMIN D 25 HYDROXY (VIT D DEFICIENCY, FRACTURES): Vit D, 25-Hydroxy: 52 ng/mL (ref 30–100)

## 2021-06-16 NOTE — Progress Notes (Signed)
Assessment and Plan:  Dana [name] was seen today for acute visit.  Diagnoses and all orders for this visit:  Tinea cruris -     nystatin (MYCOSTATIN/NYSTOP) powder; Apply daily after a shower as needed -     clotrimazole-betamethasone (LOTRISONE) cream; Apply to affected area 2 times daily       - Patient is to apply Nystatin powder at bedtime and sleep with bottoms off and keep area clean and dry.  She will apply Lotrisone 1-2 times a day as needed.    Inguinal hernia of right side without obstruction or gangrene        - Monitor for symptoms, if increases in size or becomes painful she is to let us know and we can refer to surgeon for evaluation.  If pain drastically increases suddenly she is to go to the ER.      Further disposition pending results of labs. Discussed med's effects and SE's.   Over 30 minutes of exam, counseling, chart review, and critical decision making was performed.   Future Appointments  Date Time Provider [place]  10/21/2021 10:00 AM Unk Pinto, MD [facility] None  05/09/2022  2:30 PM Liane Comber, NP [facility] None    ------------------------------------------------------------------------------------------------------------------   HPI BP (!) 100/54   Pulse 60   Temp 97.7 F (36.5 C)   Wt 124 lb 12.8 oz (56.6 kg)   SpO2 97%   BMI 22.11 kg/m  80 y.o.female presents for tenderness of buttocks x 2 weeks.  Describes a burning /tender sensation between buttocks that is more irritated with BM and occasionally urination  Does report a history of anal fissure as well as hemorrhoid external.  She does deal with constipation and had an episode last week and then noticed some bright red spots on toilet paper. Denies abdominal pain , itching and discharge.  Appetite is good but states she has lost approximately 20 pounds in the past 2 years.   Pt states over the past year she has noticed a small lump in RLQ of abdomen.  She states it is intermittently  painful and describes as an ache .  Does notice it with bearing down or can occur spontaneously.   MI is Body mass index is 22.11 kg/m., she has been working on diet and exercise. Wt Readings from Last 3 Encounters:  06/17/21 124 lb 12.8 oz (56.6 kg)  05/06/21 124 lb (56.2 kg)  01/11/21 123 lb 9.6 oz (56.1 kg)     Past Medical History:  Diagnosis Date   Anemia    Cataract    Diverticulosis 09/03/2020   Environmental allergies    Gastric ulcer    Heartburn    Hiatal hernia    Hyperlipidemia    Hypertension    Hypothyroidism    Migraines    Prediabetes    Rash october 2012   all over abdomen and breasts   Rhabdomyolysis, traumatic   12/09/2020 12/09/2020   Rheumatoid arthritis(714.0)    Tremor, essential    right hand     Allergies  Allergen Reactions   Codeine Nausea Only   Mysoline [Primidone] Nausea Only and Other (See Comments)    Over-sedated   Sulfa Antibiotics Itching    Current Outpatient Medications on File Prior to Visit  Medication Sig   acetaminophen (TYLENOL) 500 MG tablet Take 1,000 mg by mouth at bedtime.   Cholecalciferol (VITAMIN D) 125 MCG (5000 UT) CAPS Take 10,000 Units by mouth every evening.   Cyanocobalamin (VITAMIN B 12  PO) Place 1 tablet under the tongue every evening.   dicyclomine (BENTYL) 20 MG tablet Take  1 tablet  3 x /day  before Meals  for Cramping, Bloating , Nausea or Diarrhea   folic acid (FOLVITE) [id number] MCG tablet Take 1 tablet daily. (Patient taking differently: Take 800 mcg by mouth daily.)   furosemide (LASIX) 40 MG tablet TAKE 1 TABLET 2  TIMES DAILY FOR FLUID  RETENTION AND ANKLE  SWELLING (Patient taking differently: Take 40 mg by mouth 2 (two) times daily. FOR FLUID  RETENTION AND ANKLE SWELLING)   hydrocortisone 2.5 % ointment Apply topically 2 (two) times daily.   hydroxychloroquine (PLAQUENIL) 200 MG tablet Take 200 mg by mouth See admin instructions. Take one tablet (200 mcg) by mouth twice daily Monday thru Friday (skip  Saturday and Sunday)   levothyroxine (SYNTHROID) 100 MCG tablet Take      1 tablet       Daily       on an empty stomach with only water for 30 minutes & no Antacid meds, Calcium or Magnesium for 4 hours & avoid Biotin (Patient taking differently: Take 50-100 mcg by mouth See admin instructions. Take 1 tablet (100 mcg) by mouth daily on Monday thru Friday mornings; take 1/2 tablet (50 mcg) on Saturday and Sunday mornings. Take on an empty stomach with only water for 30 minutes & no Antacid meds, Calcium or Magnesium for 4 hours & avoid Biotin)   methotrexate (RHEUMATREX) 2.5 MG tablet Take 12.5 mg by mouth See admin instructions. Take 5 tablets (12.5 mg) by mouth twice weekly - Sunday and Wednesday   montelukast (SINGULAIR) 10 MG tablet TAKE 1 TABLET EVERY DAY   Multiple Vitamin (MULTIVITAMIN WITH MINERALS) TABS tablet Take 1 tablet by mouth every evening.   mupirocin ointment (BACTROBAN) 2 % 1 application 2 (two) times daily.   ondansetron (ZOFRAN ODT) 8 MG disintegrating tablet '8mg'$  ODT q4 hours prn nausea (Patient taking differently: Take 8 mg by mouth every 4 (four) hours as needed for nausea or vomiting.)   polyethylene glycol (MIRALAX / GLYCOLAX) 17 g packet Take 17 g by mouth daily.   propranolol (INDERAL) 80 MG tablet TAKE 1 TABLET BY MOUTH 3  TIMES DAILY (Patient taking differently: Take 80 mg by mouth 2 (two) times daily. For tremors)   No current facility-administered medications on file prior to visit.    ROS: all negative except above.   Physical Exam:  BP (!) 100/54   Pulse 60   Temp 97.7 F (36.5 C)   Wt 124 lb 12.8 oz (56.6 kg)   SpO2 97%   BMI 22.11 kg/m   General Appearance: Well nourished, in no apparent distress. Eyes: PERRLA, EOMs, conjunctiva no swelling or erythema Sinuses: No Frontal/maxillary tenderness ENT/Mouth: Ext aud canals clear, TMs without erythema, bulging. No erythema, swelling, or exudate on post pharynx.  Tonsils not swollen or erythematous. Hearing  normal.  Neck: Supple, thyroid normal.  Respiratory: Respiratory effort normal, BS equal bilaterally without rales, rhonchi, wheezing or stridor.  Cardio: RRR with no MRGs. Brisk peripheral pulses without edema.  Abdomen: Soft, + BS.  Right inguinal hernia noticed, slightly tender to palpation.  Lymphatics: Non tender without lymphadenopathy.  Musculoskeletal: Full ROM, 5/5 strength, normal gait.  Skin: Erythema without lesion noticed between buttocks.  Neuro: Cranial nerves intact. Normal muscle tone, no cerebellar symptoms. Sensation intact.  Psych: Awake and oriented X 3, normal affect, Insight and Judgment appropriate.     Dana [name] W  Dana Fazzini, NP 11:57 AM  [facility]

## 2021-06-17 ENCOUNTER — Encounter: Payer: Self-pay | Admitting: Nurse Practitioner

## 2021-06-17 ENCOUNTER — Other Ambulatory Visit: Payer: Self-pay

## 2021-06-17 ENCOUNTER — Ambulatory Visit (INDEPENDENT_AMBULATORY_CARE_PROVIDER_SITE_OTHER): Payer: Medicare HMO | Admitting: Nurse Practitioner

## 2021-06-17 VITALS — BP 100/54 | HR 60 | Temp 97.7°F | Wt 124.8 lb

## 2021-06-17 DIAGNOSIS — K4090 Unilateral inguinal hernia, without obstruction or gangrene, not specified as recurrent: Secondary | ICD-10-CM | POA: Diagnosis not present

## 2021-06-17 DIAGNOSIS — B356 Tinea cruris: Secondary | ICD-10-CM

## 2021-06-17 MED ORDER — NYSTATIN 100000 UNIT/GM EX POWD: CUTANEOUS | 2 refills | Status: DC

## 2021-06-17 MED ORDER — CLOTRIMAZOLE-BETAMETHASONE 1-0.05 % EX CREA: TOPICAL_CREAM | CUTANEOUS | 1 refills | Status: DC

## 2021-06-17 NOTE — Patient Instructions (Addendum)
Use Nystatin powder before bed, sleep with bottoms off.  Use Lotrisone 1-2 times a day for 5 days.     Skin Yeast Infection  A skin yeast infection is a condition in which there is an overgrowth of yeast (candida) that normally lives on the skin. This condition usually occurs in areas ofthe skin that are constantly warm and moist, such as the armpits or the groin. What are the causes? This condition is caused by a change in the normal balance of the yeast andbacteria that live on the skin. What increases the risk? You are more likely to develop this condition if you: Are obese. Are pregnant. Take birth control pills. Have diabetes. Take antibiotic medicines. Take steroid medicines. Are malnourished. Have a weak body defense system (immune system). Are 82 years of age or older. Wear tight clothing. What are the signs or symptoms? The most common symptom of this condition is itchiness in the affected area. Other symptoms include: Red, swollen area of the skin. Bumps on the skin. How is this diagnosed? This condition is diagnosed with a medical history and physical exam. Your health care provider may check for yeast by taking light scrapings of the skin to be viewed under a microscope. How is this treated? This condition is treated with medicine. Medicines may be prescribed or be available over the counter. The medicines may be: Taken by mouth (orally). Applied as a cream or powder to your skin. Follow these instructions at home:  Take or apply over-the-counter and prescription medicines only as told by your health care provider. Maintain a healthy weight. If you need help losing weight, talk with your health care provider. Keep your skin clean and dry. If you have diabetes, keep your blood sugar under control. Keep all follow-up visits as told by your health care provider. This is important. Contact a health care provider if: Your symptoms go away and then return. Your  symptoms do not get better with treatment. Your symptoms get worse. Your rash spreads. You have a fever or chills. You have new symptoms. You have new warmth or redness of your skin. Summary A skin yeast infection is a condition in which there is an overgrowth of yeast (candida) that normally lives on the skin. This condition is caused by a change in the normal balance of the yeast and bacteria that live on the skin. Take or apply over-the-counter and prescription medicines only as told by your health care provider. Keep your skin clean and dry. Contact a health care provider if your symptoms do not get better with treatment. This information is not intended to replace advice given to you by your health care provider. Make sure you discuss any questions you have with your healthcare provider. Document Revised: 03/27/2018 Document Reviewed: 03/27/2018 Elsevier Patient Education  [employer].

## 2021-06-23 DIAGNOSIS — L570 Actinic keratosis: Secondary | ICD-10-CM | POA: Diagnosis not present

## 2021-06-24 ENCOUNTER — Other Ambulatory Visit: Payer: Self-pay | Admitting: Internal Medicine

## 2021-06-24 DIAGNOSIS — G250 Essential tremor: Secondary | ICD-10-CM

## 2021-06-24 DIAGNOSIS — E039 Hypothyroidism, unspecified: Secondary | ICD-10-CM

## 2021-06-24 DIAGNOSIS — R609 Edema, unspecified: Secondary | ICD-10-CM

## 2021-06-26 ENCOUNTER — Encounter: Payer: Self-pay | Admitting: *Deleted

## 2021-06-26 ENCOUNTER — Ambulatory Visit (INDEPENDENT_AMBULATORY_CARE_PROVIDER_SITE_OTHER): Payer: Medicare HMO

## 2021-06-26 ENCOUNTER — Ambulatory Visit
Admission: EM | Admit: 2021-06-26 | Discharge: 2021-06-26 | Disposition: A | Discharge status: Home / Self Care | Payer: Medicare HMO | Attending: Urgent Care | Admitting: Urgent Care

## 2021-06-26 ENCOUNTER — Other Ambulatory Visit: Payer: Self-pay

## 2021-06-26 DIAGNOSIS — W19XXXA Unspecified fall, initial encounter: Secondary | ICD-10-CM | POA: Diagnosis not present

## 2021-06-26 DIAGNOSIS — M79671 Pain in right foot: Secondary | ICD-10-CM

## 2021-06-26 DIAGNOSIS — W06XXXA Fall from bed, initial encounter: Secondary | ICD-10-CM | POA: Diagnosis not present

## 2021-06-26 DIAGNOSIS — S0081XA Abrasion of other part of head, initial encounter: Secondary | ICD-10-CM | POA: Diagnosis not present

## 2021-06-26 DIAGNOSIS — Z23 Encounter for immunization: Secondary | ICD-10-CM

## 2021-06-26 DIAGNOSIS — S51819A Laceration without foreign body of unspecified forearm, initial encounter: Secondary | ICD-10-CM

## 2021-06-26 MED ORDER — TETANUS-DIPHTH-ACELL PERTUSSIS 5-2.5-18.5 LF-MCG/0.5 IM SUSY
0.5000 mL | PREFILLED_SYRINGE | Freq: Once | INTRAMUSCULAR | Status: AC
Start: 2021-06-26 — End: 2021-06-26
  Administered 2021-06-26: 0.5 mL via INTRAMUSCULAR

## 2021-06-26 MED ORDER — ACETAMINOPHEN 325 MG PO TABS: 650.0000 mg | ORAL_TABLET | Freq: Four times a day (QID) | ORAL | 0 refills | Status: AC | PRN

## 2021-06-26 NOTE — ED Triage Notes (Signed)
Pt reports she was reaching for alarm clock this AM and was reaching for it when she fell. Pt reports she woke up on the floor . Py hit forehead and now has pain to Lt knee and rt foot. Pt do not take blood thinners.

## 2021-06-26 NOTE — ED Provider Notes (Signed)
[place]   MRN: [id number] DOB: 08-08-1941  Subjective:   Dana [name] is a 80 y.o. female presenting for suffering an accidental fall from her bed this morning.  Patient states that she has difficulty ambulating, has to use a cane.  She was trying to reach for her clock and was trying to stand out of her bed.  She has a bookshelf very close by, bathroom door straight in front of her as well.  Unfortunately she lost her balance and made impact against a bookshelf with her right forearm, twisted her foot against the floor and scraped her head against the bathroom door.  Denies loss of consciousness, confusion, dizziness, vision changes.  No changes in her weakness but she has significant difficulty bearing weight on the right foot.  She applied alcohol to the wounds to clean them, applied Band-Aids and dressings to her forearm wounds and her forehead scrape.  No current facility-administered medications for this encounter.  Current Outpatient Medications:    Cholecalciferol (VITAMIN D) 125 MCG (5000 UT) CAPS, Take 10,000 Units by mouth every evening., Disp: , Rfl:    Cyanocobalamin (VITAMIN B 12 PO), Place 1 tablet under the tongue every evening., Disp: , Rfl:    dicyclomine (BENTYL) 20 MG tablet, Take  1 tablet  3 x /day  before Meals  for Cramping, Bloating , Nausea or Diarrhea, Disp: 270 tablet, Rfl: 0   furosemide (LASIX) 40 MG tablet, TAKE 1 TABLET 2  TIMES DAILY FOR FLUID  RETENTION AND ANKLE  SWELLING, Disp: 180 tablet, Rfl: 3   hydroxychloroquine (PLAQUENIL) 200 MG tablet, Take 200 mg by mouth See admin instructions. Take one tablet (200 mcg) by mouth twice daily Monday thru Friday (skip Saturday and Sunday), Disp: , Rfl:    levothyroxine (SYNTHROID) 100 MCG tablet, Take  1 tablet  Daily  on an empty stomach with only water for 30 minutes & no Antacid meds, Calcium or Magnesium for 4 hours & avoid Biotin, Disp: 90 tablet, Rfl: 3   methotrexate (RHEUMATREX) 2.5 MG  tablet, Take 12.5 mg by mouth See admin instructions. Take 5 tablets (12.5 mg) by mouth twice weekly - Sunday and Wednesday, Disp: , Rfl:    Multiple Vitamin (MULTIVITAMIN WITH MINERALS) TABS tablet, Take 1 tablet by mouth every evening., Disp: , Rfl:    polyethylene glycol (MIRALAX / GLYCOLAX) 17 g packet, Take 17 g by mouth daily., Disp: 14 each, Rfl: 0   acetaminophen (TYLENOL) 500 MG tablet, Take 1,000 mg by mouth at bedtime., Disp: , Rfl:    clotrimazole-betamethasone (LOTRISONE) cream, Apply to affected area 2 times daily, Disp: 15 g, Rfl: 1   diazepam (VALIUM) 5 MG tablet, TAKE 1 TABLET THREE TIMES DAILY FOR TREMOR, Disp: 270 tablet, Rfl: 1   folic acid (FOLVITE) Q000111Q MCG tablet, Take 1 tablet daily. (Patient taking differently: Take 800 mcg by mouth daily.), Disp: 90 tablet, Rfl: 1   hydrocortisone 2.5 % ointment, Apply topically 2 (two) times daily., Disp: , Rfl:    montelukast (SINGULAIR) 10 MG tablet, TAKE 1 TABLET EVERY DAY, Disp: 90 tablet, Rfl: 3   mupirocin ointment (BACTROBAN) 2 %, 1 application 2 (two) times daily., Disp: , Rfl:    nystatin (MYCOSTATIN/NYSTOP) powder, Apply daily after a shower as needed, Disp: 60 g, Rfl: 2   ondansetron (ZOFRAN ODT) 8 MG disintegrating tablet, '8mg'$  ODT q4 hours prn nausea (Patient taking differently: Take 8 mg by mouth every 4 (four) hours as needed for nausea or  vomiting.), Disp: 30 tablet, Rfl: 3   propranolol (INDERAL) 80 MG tablet, TAKE 1 TABLET BY MOUTH 3  TIMES DAILY (Patient taking differently: Take 80 mg by mouth 2 (two) times daily. For tremors), Disp: 270 tablet, Rfl: 3   Allergies  Allergen Reactions   Codeine Nausea Only   Mysoline [Primidone] Nausea Only and Other (See Comments)    Over-sedated   Sulfa Antibiotics Itching    Past Medical History:  Diagnosis Date   Anemia    Cataract    Diverticulosis 09/03/2020   Environmental allergies    Gastric ulcer    Heartburn    Hiatal hernia    Hyperlipidemia    Hypertension     Hypothyroidism    Migraines    Prediabetes    Rash october 2012   all over abdomen and breasts   Rhabdomyolysis, traumatic   12/09/2020 12/09/2020   Rheumatoid arthritis(714.0)    Tremor, essential    right hand     Past Surgical History:  Procedure Laterality Date   ABDOMINAL HYSTERECTOMY     CATARACT EXTRACTION, BILATERAL     CHOLECYSTECTOMY  08/31/11   lap chole    COLONOSCOPY     TUBAL LIGATION      Family History  Problem Relation Age of Onset   Heart disease Mother    Diabetes Mother    Heart disease Father    Heart disease Maternal Aunt        x3    Heart disease Maternal Uncle        x 4   Diabetes Sister        borderline   Heart attack Son 37       Sudden cardiac death   Colon cancer Neg Hx     Social History   Tobacco Use   Smoking status: Former    Types: Cigarettes    Quit date: 09/18/1984    Years since quitting: 36.7   Smokeless tobacco: Never   Tobacco comments:    stopped around 25 years ago  Substance Use Topics   Alcohol use: No   Drug use: No    ROS   Objective:   Vitals: BP (!) 109/51   Pulse (!) 58   Temp 98.1 F (36.7 C)   Resp 20   SpO2 96%   Physical Exam Constitutional:      General: She is not in acute distress.    Appearance: Normal appearance. She is well-developed. She is not ill-appearing, toxic-appearing or diaphoretic.  HENT:     Head: Normocephalic and atraumatic.      Right Ear: Tympanic membrane, ear canal and external ear normal. No drainage or tenderness. No middle ear effusion. There is no impacted cerumen. Tympanic membrane is not erythematous.     Left Ear: Tympanic membrane, ear canal and external ear normal. No drainage or tenderness.  No middle ear effusion. There is no impacted cerumen. Tympanic membrane is not erythematous.     Nose: Nose normal. No congestion or rhinorrhea.     Mouth/Throat:     Mouth: Mucous membranes are moist. No oral lesions.     Pharynx: Oropharynx is clear. No pharyngeal  swelling, oropharyngeal exudate, posterior oropharyngeal erythema or uvula swelling.     Tonsils: No tonsillar exudate or tonsillar abscesses.  Eyes:     General: No scleral icterus.       Right eye: No discharge.        Left eye: No discharge.  Extraocular Movements: Extraocular movements intact.     Right eye: Normal extraocular motion.     Left eye: Normal extraocular motion.     Conjunctiva/sclera: Conjunctivae normal.     Pupils: Pupils are equal, round, and reactive to light.  Cardiovascular:     Rate and Rhythm: Normal rate and regular rhythm.     Pulses: Normal pulses.     Heart sounds: Normal heart sounds. No murmur heard.   No friction rub. No gallop.  Pulmonary:     Effort: Pulmonary effort is normal. No respiratory distress.     Breath sounds: Normal breath sounds. No stridor. No wheezing, rhonchi or rales.  Musculoskeletal:     Right shoulder: No swelling, deformity, effusion, laceration, tenderness, bony tenderness or crepitus. Normal range of motion. Normal strength.     Left shoulder: No swelling, deformity, effusion, laceration, tenderness, bony tenderness or crepitus. Normal range of motion. Normal strength.     Right upper arm: No swelling, edema, deformity, lacerations, tenderness or bony tenderness.     Left upper arm: No swelling, edema, deformity, lacerations, tenderness or bony tenderness.     Right elbow: No swelling, deformity, effusion or lacerations. Normal range of motion. No tenderness.     Left elbow: No swelling, deformity, effusion or lacerations. Normal range of motion. No tenderness.     Right forearm: Laceration (Avulsion skin tears as outlined) and tenderness (Mild over the wounds) present. No swelling, edema, deformity or bony tenderness.     Left forearm: No swelling, edema, deformity, lacerations, tenderness or bony tenderness.     Right wrist: No swelling, deformity, effusion, lacerations, tenderness, bony tenderness, snuff box tenderness or  crepitus. Normal range of motion.     Left wrist: No swelling, deformity, effusion, lacerations, tenderness, bony tenderness, snuff box tenderness or crepitus. Normal range of motion.     Right hand: No swelling, deformity, lacerations, tenderness or bony tenderness. Normal range of motion. Normal strength. Normal sensation. Normal capillary refill.     Left hand: No swelling, deformity, lacerations, tenderness or bony tenderness. Normal range of motion. Normal strength. Normal sensation. Normal capillary refill.     Cervical back: Normal range of motion and neck supple.     Right knee: No swelling, deformity, effusion, erythema, ecchymosis, lacerations, bony tenderness or crepitus. Normal range of motion. No tenderness. Normal alignment.     Left knee: No swelling, deformity, effusion, erythema, ecchymosis, lacerations, bony tenderness or crepitus. Normal range of motion. No tenderness. Normal alignment.     Right ankle: No swelling, deformity, ecchymosis or lacerations. No tenderness. Normal range of motion.     Left ankle: No swelling, deformity, ecchymosis or lacerations. No tenderness. Normal range of motion.     Right foot: Decreased range of motion. Tenderness present. No laceration.  Lymphadenopathy:     Cervical: No cervical adenopathy.  Skin:    General: Skin is warm and dry.     Findings: No rash.  Neurological:     General: No focal deficit present.     Mental Status: She is alert and oriented to person, place, and time.     Cranial Nerves: No cranial nerve deficit.     Motor: Weakness (but not new) present.  Psychiatric:        Mood and Affect: Mood normal.        Behavior: Behavior normal.        Thought Content: Thought content normal.        Judgment: Judgment normal.  Unfortunately epic was going very slow and therefore will document.  The patient had tenderness about the dorsal medial aspect of the right foot without ecchymosis, swelling.  The lacerations of the forearm  were very superficial, skin tears.  The cumulative length was 3 cm.  Dermabond was applied to the wounds and secured with pressure dressing after the Dermabond dried.  DG Foot Complete Right  Result Date: 06/26/2021 CLINICAL DATA:  Right foot pain after a fall. EXAM: RIGHT FOOT COMPLETE - 3+ VIEW COMPARISON:  Right foot radiographs dated 03/10/2015. FINDINGS: There is no evidence of fracture or dislocation. There is mild osteoarthritis involving the first metatarsal-phalangeal joint and first interphalangeal joint. There is diffuse osseous demineralization. A plantar calcaneal enthesophyte is noted. Soft tissues are unremarkable. IMPRESSION: No acute osseous injury. Electronically Signed   By: Zerita Boers M.D.   On: 06/26/2021 13:49     Assessment and Plan :   PDMP not reviewed this encounter.  1. Skin tear of forearm without complication, initial encounter   2. Facial abrasion, initial encounter   3. Right foot pain   4. Accidental fall from bed, initial encounter     Recommended conservative management, Tylenol for pain.  Patient does not have any signs of an acute intracranial injury.  Therefore deferred an ER visit for CT imaging.  Maintain strict ER precautions.  Lacerations were successfully repaired. Counseled patient on potential for adverse effects with medications prescribed/recommended today, ER and return-to-clinic precautions discussed, patient verbalized understanding.    Jaynee Eagles, PA-C 06/26/21 1505

## 2021-07-20 ENCOUNTER — Other Ambulatory Visit: Payer: Self-pay | Admitting: Internal Medicine

## 2021-07-20 DIAGNOSIS — Z79899 Other long term (current) drug therapy: Secondary | ICD-10-CM | POA: Diagnosis not present

## 2021-07-20 DIAGNOSIS — G250 Essential tremor: Secondary | ICD-10-CM

## 2021-07-20 DIAGNOSIS — Z961 Presence of intraocular lens: Secondary | ICD-10-CM | POA: Diagnosis not present

## 2021-07-20 DIAGNOSIS — H524 Presbyopia: Secondary | ICD-10-CM | POA: Diagnosis not present

## 2021-07-20 MED ORDER — PROPRANOLOL HCL 80 MG PO TABS: ORAL_TABLET | ORAL | 3 refills | Status: DC

## 2021-08-12 DIAGNOSIS — L821 Other seborrheic keratosis: Secondary | ICD-10-CM | POA: Diagnosis not present

## 2021-08-12 DIAGNOSIS — L570 Actinic keratosis: Secondary | ICD-10-CM | POA: Diagnosis not present

## 2021-08-12 DIAGNOSIS — L814 Other melanin hyperpigmentation: Secondary | ICD-10-CM | POA: Diagnosis not present

## 2021-08-12 DIAGNOSIS — Z86007 Personal history of in-situ neoplasm of skin: Secondary | ICD-10-CM | POA: Diagnosis not present

## 2021-08-12 DIAGNOSIS — T148XXA Other injury of unspecified body region, initial encounter: Secondary | ICD-10-CM | POA: Diagnosis not present

## 2021-08-12 DIAGNOSIS — Z08 Encounter for follow-up examination after completed treatment for malignant neoplasm: Secondary | ICD-10-CM | POA: Diagnosis not present

## 2021-08-12 DIAGNOSIS — D225 Melanocytic nevi of trunk: Secondary | ICD-10-CM | POA: Diagnosis not present

## 2021-09-06 ENCOUNTER — Other Ambulatory Visit: Payer: Self-pay | Admitting: Nurse Practitioner

## 2021-09-06 ENCOUNTER — Telehealth: Payer: Self-pay

## 2021-09-06 DIAGNOSIS — K4090 Unilateral inguinal hernia, without obstruction or gangrene, not specified as recurrent: Secondary | ICD-10-CM

## 2021-09-06 NOTE — Telephone Encounter (Signed)
I have placed the referral please advise if pain becomes worse go to ER

## 2021-09-06 NOTE — Telephone Encounter (Signed)
Patient is requesting a referral to see a general surgeon for hernia. States that it is causing a lot of pain.

## 2021-09-22 ENCOUNTER — Ambulatory Visit (INDEPENDENT_AMBULATORY_CARE_PROVIDER_SITE_OTHER): Payer: Medicare HMO

## 2021-09-22 ENCOUNTER — Ambulatory Visit
Admission: RE | Admit: 2021-09-22 | Discharge: 2021-09-22 | Disposition: A | Discharge status: Home / Self Care | Payer: Medicare HMO | Source: Ambulatory Visit | Attending: Physician Assistant | Admitting: Physician Assistant

## 2021-09-22 ENCOUNTER — Other Ambulatory Visit: Payer: Self-pay

## 2021-09-22 VITALS — BP 122/70 | HR 58 | Temp 97.9°F | Resp 18

## 2021-09-22 DIAGNOSIS — M7989 Other specified soft tissue disorders: Secondary | ICD-10-CM | POA: Diagnosis not present

## 2021-09-22 DIAGNOSIS — M25572 Pain in left ankle and joints of left foot: Secondary | ICD-10-CM

## 2021-09-22 NOTE — ED Triage Notes (Signed)
Pt c/o left ankle and foot edema and pain states it feels like something is "jabbing" and points to medial left ankle region. States she talked with pcp about this and felt they were not concerned.

## 2021-09-22 NOTE — ED Provider Notes (Signed)
[facility]    CSN: [id number] Arrival date & time: 09/22/21  1046      History   Chief Complaint Chief Complaint  Patient presents with   left foot edema/pain    HPI Dana [name] is a 80 y.o. female.   Patient here today for evaluation of left medial ankle tenderness and pain that has been ongoing for the last week or so.  She did have a fall recently where she fell onto her right knee, is not sure if she hurt her ankle at that time but pain has gradually worsened since then.  She reports that movement of her ankle and foot makes pain worse as does walking and weightbearing.  She describes the pain as a jabbing pain that is gradually worsening.  She has had some numbness to her foot but this is normal for her at baseline.  No worsening numbness noted.  The history is provided by the patient.   Past Medical History:  Diagnosis Date   Anemia    Cataract    Diverticulosis 09/03/2020   Environmental allergies    Gastric ulcer    Heartburn    Hiatal hernia    Hyperlipidemia    Hypertension    Hypothyroidism    Migraines    Prediabetes    Rash october 2012   all over abdomen and breasts   Rhabdomyolysis, traumatic   12/09/2020 12/09/2020   Rheumatoid arthritis(714.0)    Tremor, essential    right hand    Patient Active Problem List   Diagnosis Date Noted   History of basal cell carcinoma (BCC) 05/06/2021   History of SCC (squamous cell carcinoma) of skin 05/06/2021   COVID-19 virus infection 12/09/2020 12/10/2020   Right knee pain 12/10/2020   Diverticulosis 09/03/2020   Aortic atherosclerosis ([place]) by CT scan 08/2020 08/17/2020   Osteopenia 12/05/2018   FHx: heart disease 09/03/2018   Former smoker (quit 1985) 09/03/2018   BMI 21.0-21.9, adult 05/30/2018   Generalized OA 09/02/2015   Vitamin D deficiency 01/06/2014   Hypertension    Other abnormal glucose    Rheumatoid arthritis ([place])    Hereditary essential tremor    Hypothyroidism     Migraines    Hyperlipidemia, mixed    Chronic anemia     Past Surgical History:  Procedure Laterality Date   ABDOMINAL HYSTERECTOMY     CATARACT EXTRACTION, BILATERAL     CHOLECYSTECTOMY  08/31/11   lap chole    COLONOSCOPY     TUBAL LIGATION      OB History   No obstetric history on file.      Home Medications    Prior to Admission medications   Medication Sig Start Date End Date Taking? Authorizing Provider  acetaminophen (TYLENOL) 325 MG tablet Take 2 tablets (650 mg total) by mouth every 6 (six) hours as needed. 06/26/21   Jaynee Eagles, PA-C  Cholecalciferol (VITAMIN D) 125 MCG (5000 UT) CAPS Take 10,000 Units by mouth every evening.    [provider]  clotrimazole-betamethasone (LOTRISONE) cream Apply to affected area 2 times daily 06/17/21 06/17/22  Magda Bernheim, NP  Cyanocobalamin (VITAMIN B 12 PO) Place 1 tablet under the tongue every evening.    [provider]  diazepam (VALIUM) 5 MG tablet TAKE 1 TABLET THREE TIMES DAILY FOR TREMOR 06/24/21   Unk Pinto, MD  dicyclomine (BENTYL) 20 MG tablet Take  1 tablet  3 x /day  before Meals  for Cramping, Bloating ,  Nausea or Diarrhea 01/28/21   Unk Pinto, MD  folic acid (FOLVITE) 268 MCG tablet Take 1 tablet daily. Patient taking differently: Take 800 mcg by mouth daily. 05/19/20   Unk Pinto, MD  furosemide (LASIX) 40 MG tablet TAKE 1 TABLET 2  TIMES DAILY FOR FLUID  RETENTION AND ANKLE  SWELLING 06/24/21   Unk Pinto, MD  hydrocortisone 2.5 % ointment Apply topically 2 (two) times daily.    [provider]  hydroxychloroquine (PLAQUENIL) 200 MG tablet Take 200 mg by mouth See admin instructions. Take one tablet (200 mcg) by mouth twice daily Monday thru Friday (skip Saturday and Sunday) 03/22/17   [provider]  levothyroxine (SYNTHROID) 100 MCG tablet Take  1 tablet  Daily  on an empty stomach with only water for 30 minutes & no Antacid meds, Calcium or Magnesium for 4 hours &  avoid Biotin 06/24/21   Unk Pinto, MD  methotrexate (RHEUMATREX) 2.5 MG tablet Take 12.5 mg by mouth See admin instructions. Take 5 tablets (12.5 mg) by mouth twice weekly - Sunday and Wednesday 02/10/17   [provider]  montelukast (SINGULAIR) 10 MG tablet TAKE 1 TABLET EVERY DAY 01/13/21   Liane Comber, NP  Multiple Vitamin (MULTIVITAMIN WITH MINERALS) TABS tablet Take 1 tablet by mouth every evening.    [provider]  mupirocin ointment (BACTROBAN) 2 % 1 application 2 (two) times daily.    [provider]  nystatin (MYCOSTATIN/NYSTOP) powder Apply daily after a shower as needed 06/17/21   Magda Bernheim, NP  ondansetron (ZOFRAN ODT) 8 MG disintegrating tablet 8mg  ODT q4 hours prn nausea Patient taking differently: Take 8 mg by mouth every 4 (four) hours as needed for nausea or vomiting. 04/19/16   Rolene Course, PA-C  polyethylene glycol (MIRALAX / GLYCOLAX) 17 g packet Take 17 g by mouth daily. 12/14/20   Bonnielee Haff, MD  propranolol (INDERAL) 80 MG tablet Take  1 tablet  2 x /day  for BP & Tremors 07/20/21   Unk Pinto, MD    Family History Family History  Problem Relation Age of Onset   Heart disease Mother    Diabetes Mother    Heart disease Father    Heart disease Maternal Aunt        x3    Heart disease Maternal Uncle        x 4   Diabetes Sister        borderline   Heart attack Son 35       Sudden cardiac death   Colon cancer Neg Hx     Social History Social History   Tobacco Use   Smoking status: Former    Types: Cigarettes    Quit date: 09/18/1984    Years since quitting: 37.0   Smokeless tobacco: Never   Tobacco comments:    stopped around 25 years ago  Substance Use Topics   Alcohol use: No   Drug use: No     Allergies   Codeine, Mysoline [primidone], and Sulfa antibiotics   Review of Systems Review of Systems  Constitutional:  Negative for chills and fever.  Eyes:  Negative for discharge and redness.   Gastrointestinal:  Negative for nausea and vomiting.  Musculoskeletal:  Positive for arthralgias and joint swelling.  Neurological:  Positive for numbness.    Physical Exam Triage Vital Signs ED Triage Vitals [09/22/21 1117]  Enc Vitals Group     BP 122/70     Pulse Rate (!) 58  Resp 18     Temp 97.9 F (36.6 C)     Temp Source Oral     SpO2 98 %     Weight      Height      Head Circumference      Peak Flow      Pain Score 7     Pain Loc      Pain Edu?      Excl. in [place]?    No data found.  Updated Vital Signs BP 122/70 (BP Location: Left Arm)   Pulse (!) 58   Temp 97.9 F (36.6 C) (Oral)   Resp 18   SpO2 98%     Physical Exam Vitals and nursing note reviewed.  Constitutional:      General: She is not in acute distress.    Appearance: Normal appearance. She is not ill-appearing.  HENT:     Head: Normocephalic and atraumatic.  Eyes:     Conjunctiva/sclera: Conjunctivae normal.  Cardiovascular:     Rate and Rhythm: Normal rate.     Pulses: Normal pulses.  Musculoskeletal:     Comments: Diffuse edema noted to left lower leg (but similar to that noted to right lower leg) Mild TTP noted diffusely to left medial malleolus  Skin:    General: Skin is warm and dry.  Neurological:     Mental Status: She is alert.     UC Treatments / Results  Labs (all labs ordered are listed, but only abnormal results are displayed) Labs Reviewed - No data to display  EKG   Radiology DG Ankle Complete Left  Result Date: 09/22/2021 CLINICAL DATA:  Left ankle edema EXAM: LEFT ANKLE COMPLETE - 3+ VIEW COMPARISON:  05/13/2020 FINDINGS: Lower extremity, calf and foot edema noted. Bones are osteopenic. No acute osseous finding, displaced fracture, or malalignment. No other joint abnormality. Plantar calcaneal spur noted. Degenerative changes of the first MTP joint noted. No large radiopaque foreign body. IMPRESSION: Diffuse soft tissue swelling. Osteopenia. Degenerative changes  as above without acute osseous finding. Electronically Signed   By: Jerilynn Mages.  Shick M.D.   On: 09/22/2021 12:05    Procedures Procedures (including critical care time)  Medications Ordered in UC Medications - No data to display  Initial Impression / Assessment and Plan / UC Course  I have reviewed the triage vital signs and the nursing notes.  Pertinent labs & imaging results that were available during my care of the patient were reviewed by me and considered in my medical decision making (see chart for details).   Xray ordered without fracture. Recommended follow up with PCP regarding pain. Discussed that we could not rule out DVT in the office but that her symptoms certainly do not fit typical presentation of DVT. Recommended evaluation in the ED with any worsening and discussed possible need for ortho follow up. Patient expresses understanding and encouraged follow up with any further concerns.   Final Clinical Impressions(s) / UC Diagnoses   Final diagnoses:  Acute left ankle pain   Discharge Instructions   None    ED Prescriptions   None    PDMP not reviewed this encounter.   Francene Finders, PA-C 09/22/21 1219

## 2021-10-01 DIAGNOSIS — M25572 Pain in left ankle and joints of left foot: Secondary | ICD-10-CM | POA: Diagnosis not present

## 2021-10-18 DIAGNOSIS — M25572 Pain in left ankle and joints of left foot: Secondary | ICD-10-CM | POA: Diagnosis not present

## 2021-10-19 ENCOUNTER — Other Ambulatory Visit: Payer: Self-pay

## 2021-10-19 ENCOUNTER — Ambulatory Visit (HOSPITAL_COMMUNITY)
Admission: RE | Admit: 2021-10-19 | Discharge: 2021-10-19 | Disposition: A | Discharge status: Home / Self Care | Payer: Medicare HMO | Source: Ambulatory Visit | Attending: Orthopaedic Surgery | Admitting: Orthopaedic Surgery

## 2021-10-19 ENCOUNTER — Other Ambulatory Visit (HOSPITAL_COMMUNITY): Payer: Self-pay | Admitting: Orthopaedic Surgery

## 2021-10-19 DIAGNOSIS — M7989 Other specified soft tissue disorders: Secondary | ICD-10-CM | POA: Insufficient documentation

## 2021-10-21 ENCOUNTER — Encounter: Payer: Medicare HMO | Admitting: Internal Medicine

## 2021-10-25 DIAGNOSIS — M76822 Posterior tibial tendinitis, left leg: Secondary | ICD-10-CM | POA: Diagnosis not present

## 2021-10-25 DIAGNOSIS — M25572 Pain in left ankle and joints of left foot: Secondary | ICD-10-CM | POA: Diagnosis not present

## 2021-11-03 ENCOUNTER — Ambulatory Visit (INDEPENDENT_AMBULATORY_CARE_PROVIDER_SITE_OTHER): Payer: Medicare HMO | Admitting: Adult Health

## 2021-11-03 ENCOUNTER — Encounter: Payer: Self-pay | Admitting: Adult Health

## 2021-11-03 ENCOUNTER — Other Ambulatory Visit: Payer: Self-pay

## 2021-11-03 VITALS — BP 136/62 | HR 53 | Temp 97.7°F | Wt 123.0 lb

## 2021-11-03 DIAGNOSIS — E782 Mixed hyperlipidemia: Secondary | ICD-10-CM

## 2021-11-03 DIAGNOSIS — Z23 Encounter for immunization: Secondary | ICD-10-CM

## 2021-11-03 DIAGNOSIS — I10 Essential (primary) hypertension: Secondary | ICD-10-CM

## 2021-11-03 DIAGNOSIS — R7309 Other abnormal glucose: Secondary | ICD-10-CM

## 2021-11-03 DIAGNOSIS — I700 Atherosclerosis of aorta: Secondary | ICD-10-CM | POA: Diagnosis not present

## 2021-11-03 DIAGNOSIS — E039 Hypothyroidism, unspecified: Secondary | ICD-10-CM | POA: Diagnosis not present

## 2021-11-03 DIAGNOSIS — M069 Rheumatoid arthritis, unspecified: Secondary | ICD-10-CM | POA: Diagnosis not present

## 2021-11-03 DIAGNOSIS — Z6821 Body mass index (BMI) 21.0-21.9, adult: Secondary | ICD-10-CM

## 2021-11-03 DIAGNOSIS — D649 Anemia, unspecified: Secondary | ICD-10-CM | POA: Diagnosis not present

## 2021-11-03 DIAGNOSIS — E559 Vitamin D deficiency, unspecified: Secondary | ICD-10-CM

## 2021-11-03 NOTE — Progress Notes (Signed)
3 MONTH FOLLOW UP  Assessment:   Shianne was seen today for follow-up   Diagnoses and all orders for this visit:  Atherosclerosis of aorta ([place]) Per CT 2021 Control blood pressure, cholesterol, glucose, increase exercise.   Essential hypertension Doing well continue Current regimen Will continue to monitor DASH diet  Hyperlipidemia, mixed Off of pravastatin in the past year with controlled values  Discussed dietary and exercise modifications Check lipid panel   Hypothyroidism, unspecified type continue medications the same pending lab results reminded to take on an empty stomach 30-12mins before food.  check TSH level  Tremor, essential Taking Inderol and valium for this Doing well Managing, no complications at this time.  Other abnormal glucose; hx of prediabetes Discussed dietary and exercise modifications Will monitor A1c annually or q49m if elevated  Vitamin D deficiency Continue supplimentation  Non-seasonal allergic rhinitis, unspecified trigger Taking singular Doing well at this time  Medication management CBC, CMP/GFR, mangesium   Rheumatoid Arthritis ([place]) [place] rheum is managing; doing well on current meds  Anemia Normocytic; normal iron/B12, monitor  Lower extremity edema Dependent, R > L but stable/persistent 1 year, improves in AM Most likely venous stasis; she is fairly sedentary No CHF sx Did not see benefit with increasing lasix to BID, lifestyle limits this ongoing, advised second dose PRN only if getting worse Discussed compression hose application in AM Elevate foot to heart level while sitting Get up and walk 5 min every hour, do calf pump exercises every hour  Given phone number and reminder to schedule overdue mammogram and bone density   Over 30 minutes of exam, counseling, chart review and critical decision making was performed Future Appointments  Date Time Provider [place]  04/05/2022 11:00 AM Unk Pinto,  MD [facility] None  05/09/2022  2:30 PM Liane Comber, NP [facility] None    Subjective:  Sangeeta Youse is a 80 y.o. female who presents for 3 month follow up for HTN, HLD, Hypothyroidism, Pre-Diabetes, Vitamin D Defciency, RA, and hereditary essential tremor.  She was hospitalized in Jan 2022 after she fell at home and was found several hours later by family; had rhabdo with CPK ^^ 1716 which has since resolved following IVF; incidentally + covid 19 without clear sx; questionable UTI that was treated. Had R knee contusion with suspected hemarthrosis, was recommended conservative interventions only and to follow up with Dr. Norm Salt. Did complete PT in home for recovery, reports doing well since, continues with home balance exercises. Uses cane and denies falls since. Daughter checks on her regularly.   Patient diagnosed with RA in 2001 and stable on MTX with folic acid and plaquenil and follows with [facility].  She has an essential tremor and taking propanolol for this 80mg  TID, also valium PRN.   BMI is Body mass index is 21.79 kg/m., she has not been working on diet and exercise, she does PT/strengthening exercises regularly since fall.  Wt Readings from Last 3 Encounters:  11/03/21 123 lb (55.8 kg)  06/17/21 124 lb 12.8 oz (56.6 kg)  05/06/21 124 lb (56.2 kg)   She has had elevated blood pressure since 1998. Her blood pressure has been controlled at home, today their BP is BP: 136/62 She does not workout. She denies chest pain, shortness of breath, dizziness.   She has aortic atherosclerosis per CT 08/2020.   She is not on cholesterol medication (she stopped taking pravastatin 40 mg daily 1 year ago) and denies myalgias. Her cholesterol is at goal. The  cholesterol last visit was:   Lab Results  Component Value Date   CHOL 139 05/06/2021   HDL 55 05/06/2021   LDLCALC 67 05/06/2021   TRIG 91 05/06/2021   CHOLHDL 2.5 05/06/2021   She has had brief intermittent hx prediabetes  since 2012 (5.8% in 2015). She has been working on diet and exercise for glucose management, and denies hyperglycemia, hypoglycemia , increased appetite, nausea, paresthesia of the feet, polydipsia and polyuria. Last A1C in the office was:  Lab Results  Component Value Date   HGBA1C 5.2 10/06/2020   She is on thyroid medication. Her medication was not changed last visit.  She is taking 100 mcg 1 tab daily except 1/2 tab on M, F.  Lab Results  Component Value Date   TSH 2.53 05/06/2021   Last GFR: Lab Results  Component Value Date   GFRNONAA 73 05/06/2021   Patient is on Vitamin D supplement.   Lab Results  Component Value Date   VD25OH 52 05/06/2021     She has chronic normocytic anemia; she is on C14 and folic acid supplements;  CBC Latest Ref Rng & Units 05/06/2021 12/22/2020 12/13/2020  WBC 3.8 - 10.8 Thousand/uL 3.7(L) 5.0 3.0(L)  Hemoglobin 11.7 - 15.5 g/dL 10.4(L) 10.5(L) 9.0(L)  Hematocrit 35.0 - 45.0 % 33.0(L) 31.7(L) 27.4(L)  Platelets 140 - 400 Thousand/uL 272 332 159      Medication Review: Current Outpatient Medications on File Prior to Visit  Medication Sig Dispense Refill   acetaminophen (TYLENOL) 325 MG tablet Take 2 tablets (650 mg total) by mouth every 6 (six) hours as needed. 30 tablet 0   Cholecalciferol (VITAMIN D) 125 MCG (5000 UT) CAPS Take 10,000 Units by mouth every evening.     clotrimazole-betamethasone (LOTRISONE) cream Apply to affected area 2 times daily 15 g 1   Cyanocobalamin (VITAMIN B 12 PO) Place 1 tablet under the tongue every evening.     diazepam (VALIUM) 5 MG tablet TAKE 1 TABLET THREE TIMES DAILY FOR TREMOR 270 tablet 1   dicyclomine (BENTYL) 20 MG tablet Take  1 tablet  3 x /day  before Meals  for Cramping, Bloating , Nausea or Diarrhea 481 tablet 0   folic acid (FOLVITE) 856 MCG tablet Take 1 tablet daily. (Patient taking differently: Take 800 mcg by mouth daily.) 90 tablet 1   furosemide (LASIX) 40 MG tablet TAKE 1 TABLET 2  TIMES DAILY  FOR FLUID  RETENTION AND ANKLE  SWELLING 180 tablet 3   hydrocortisone 2.5 % ointment Apply topically 2 (two) times daily.     hydroxychloroquine (PLAQUENIL) 200 MG tablet Take 200 mg by mouth See admin instructions. Take one tablet (200 mcg) by mouth twice daily Monday thru Friday (skip Saturday and Sunday)     levothyroxine (SYNTHROID) 100 MCG tablet Take  1 tablet  Daily  on an empty stomach with only water for 30 minutes & no Antacid meds, Calcium or Magnesium for 4 hours & avoid Biotin 90 tablet 3   methotrexate (RHEUMATREX) 2.5 MG tablet Take 12.5 mg by mouth See admin instructions. Take 5 tablets (12.5 mg) by mouth twice weekly - Sunday and Wednesday     montelukast (SINGULAIR) 10 MG tablet TAKE 1 TABLET EVERY DAY 90 tablet 3   Multiple Vitamin (MULTIVITAMIN WITH MINERALS) TABS tablet Take 1 tablet by mouth every evening.     mupirocin ointment (BACTROBAN) 2 % 1 application 2 (two) times daily.     nystatin (MYCOSTATIN/NYSTOP) powder Apply daily  after a shower as needed 60 g 2   ondansetron (ZOFRAN ODT) 8 MG disintegrating tablet 8mg  ODT q4 hours prn nausea (Patient taking differently: Take 8 mg by mouth every 4 (four) hours as needed for nausea or vomiting.) 30 tablet 3   polyethylene glycol (MIRALAX / GLYCOLAX) 17 g packet Take 17 g by mouth daily. 14 each 0   propranolol (INDERAL) 80 MG tablet Take  1 tablet  2 x /day  for BP & Tremors 180 tablet 3   No current facility-administered medications on file prior to visit.    Allergies  Allergen Reactions   Codeine Nausea Only   Mysoline [Primidone] Nausea Only and Other (See Comments)    Over-sedated   Sulfa Antibiotics Itching    Current Problems (verified) Patient Active Problem List   Diagnosis Date Noted   History of basal cell carcinoma (BCC) 05/06/2021   History of SCC (squamous cell carcinoma) of skin 05/06/2021   Right knee pain 12/10/2020   Diverticulosis 09/03/2020   Aortic atherosclerosis ([place]) by CT scan 08/2020  08/17/2020   Osteopenia 12/05/2018   FHx: heart disease 09/03/2018   Former smoker (quit 1985) 09/03/2018   BMI 21.0-21.9, adult 05/30/2018   Generalized OA 09/02/2015   Vitamin D deficiency 01/06/2014   Hypertension    Other abnormal glucose    Rheumatoid arthritis (HCC)    Hereditary essential tremor    Hypothyroidism    Migraines    Hyperlipidemia, mixed    Chronic anemia      SURGICAL HISTORY She  has a past surgical history that includes Abdominal hysterectomy; Tubal ligation; Cataract extraction, bilateral; Cholecystectomy (08/31/11); and Colonoscopy. FAMILY HISTORY Her family history includes Diabetes in her mother and sister; Heart attack (age of onset: 88) in her son; Heart disease in her father, maternal aunt, maternal uncle, and mother. SOCIAL HISTORY She  reports that she quit smoking about 37 years ago. Her smoking use included cigarettes. She has never used smokeless tobacco. She reports that she does not drink alcohol and does not use drugs.  Review of Systems  Constitutional:  Negative for malaise/fatigue and weight loss.  HENT:  Negative for hearing loss and tinnitus.   Eyes:  Negative for blurred vision and double vision.  Respiratory:  Negative for cough, sputum production, shortness of breath and wheezing.   Cardiovascular:  Positive for leg swelling (left ankle, x 1 year, improves in AM, dependent). Negative for chest pain, palpitations, orthopnea, claudication and PND.  Gastrointestinal:  Negative for abdominal pain, blood in stool, constipation, diarrhea, heartburn, melena, nausea and vomiting.  Genitourinary: Negative.   Musculoskeletal:  Negative for falls, joint pain and myalgias.  Skin:  Negative for rash.  Neurological:  Positive for tremors (stable). Negative for dizziness, tingling, sensory change, weakness and headaches.  Endo/Heme/Allergies:  Negative for polydipsia.  Psychiatric/Behavioral: Negative.  Negative for depression, memory loss,  substance abuse and suicidal ideas. The patient is not nervous/anxious and does not have insomnia.   All other systems reviewed and are negative.   Objective:     Today's Vitals   11/03/21 1603 11/03/21 1621  BP: 136/62   Pulse: (!) 103 (!) 53  Temp: 97.7 F (36.5 C)   SpO2: 99%   Weight: 123 lb (55.8 kg)    Body mass index is 21.79 kg/m.  General appearance: alert, no distress, WD/WN, female HEENT: normocephalic, sclerae anicteric, TMs pearly, nares patent, no discharge or erythema, pharynx normal Oral cavity: MMM, no lesions Neck: supple, no lymphadenopathy,  no thyromegaly, no masses Heart: RRR, normal S1, S2, no murmurs Lungs: CTA bilaterally, no wheezes, rhonchi, or rales Abdomen: +bs, soft, non tender, non distended, no masses, no hepatomegaly, no splenomegaly. She has subtle bulge to R groin, non-tender (pending gen surgery evaluation).  Musculoskeletal: nontender, no swelling, no obvious deformity Extremities:  no cyanosis, no clubbing; L ankle with subtle pitting edema limited to ankle, R ankle with 1-2 + pitting edema pedal, ankle and to mid lower leg Pulses: 2+ symmetric, upper and lower extremities, normal cap refill Neurological: alert, oriented x 3, CN2-12 intact, strength normal upper extremities and lower extremities, sensation normal throughout, DTRs 2+ throughout, no cerebellar signs, gait slow steady, subtle head and vocal tremor Psychiatric: normal affect, behavior normal, pleasant    Izora Ribas, NP   11/03/2021

## 2021-11-03 NOTE — Patient Instructions (Addendum)
° ° °  HOW TO SCHEDULE A MAMMOGRAM  [facility]  7 a.m.-6:30 p.m., Monday 7 a.m.-5 p.m., Tuesday-Friday Schedule an appointment by calling [phone].    Peripheral Edema Peripheral edema is swelling that is caused by a buildup of fluid. Peripheral edema most often affects the lower legs, ankles, and feet. It can also develop in the arms, hands, and face. The area of the body that has peripheral edema will look swollen. It may also feel heavy or warm. Your clothes may start to feel tight. Pressing on the area may make a temporary dent in your skin. You may not be able to move your swollen arm or leg as much as usual. There are many causes of peripheral edema. It can happen because of a complication of other conditions such as congestive heart failure, kidney disease, or a problem with your blood circulation. It also can be a side effect of certain medicines or because of an infection. It often happens to women during pregnancy. Sometimes, the cause is not known. Follow these instructions at home: Managing pain, stiffness, and swelling  Raise (elevate) your legs while you are sitting or lying down. Move around often to prevent stiffness and to lessen swelling. Do not sit or stand for long periods of time. Wear support stockings as told by your health care provider. Medicines Take over-the-counter and prescription medicines only as told by your health care provider. Your health care provider may prescribe medicine to help your body get rid of excess water (diuretic). General instructions Pay attention to any changes in your symptoms. Follow instructions from your health care provider about limiting salt (sodium) in your diet. Sometimes, eating less salt may reduce swelling. Moisturize skin daily to help prevent skin from cracking and draining. Keep all follow-up visits as told by your health care provider. This is important. Contact a health care provider if  you have: A fever. Edema that starts suddenly or is getting worse, especially if you are pregnant or have a medical condition. Swelling in only one leg. Increased swelling, redness, or pain in one or both of your legs. Drainage or sores at the area where you have edema. Get help right away if you: Develop shortness of breath, especially when you are lying down. Have pain in your chest or abdomen. Feel weak. Feel faint. Summary Peripheral edema is swelling that is caused by a buildup of fluid. Peripheral edema most often affects the lower legs, ankles, and feet. Move around often to prevent stiffness and to lessen swelling. Do not sit or stand for long periods of time. Pay attention to any changes in your symptoms. Contact a health care provider if you have edema that starts suddenly or is getting worse, especially if you are pregnant or have a medical condition. Get help right away if you develop shortness of breath, especially when lying down. This information is not intended to replace advice given to you by your health care provider. Make sure you discuss any questions you have with your health care provider. Document Revised: 04/08/2021 Document Reviewed: 08/01/2018 Elsevier Patient Education  2022 [employer].

## 2021-11-03 NOTE — Addendum Note (Signed)
Addended by: Chancy Hurter on: 11/03/2021 05:08 PM   Modules accepted: Orders

## 2021-11-04 ENCOUNTER — Encounter: Payer: Self-pay | Admitting: Internal Medicine

## 2021-11-04 LAB — COMPLETE METABOLIC PANEL WITH GFR
AG Ratio: 1.7 (calc) (ref 1.0–2.5)
ALT: 15 U/L (ref 6–29)
AST: 22 U/L (ref 10–35)
Albumin: 3.8 g/dL (ref 3.6–5.1)
Alkaline phosphatase (APISO): 85 U/L (ref 37–153)
BUN: 17 mg/dL (ref 7–25)
CO2: 35 mmol/L — ABNORMAL HIGH (ref 20–32)
Calcium: 9.2 mg/dL (ref 8.6–10.4)
Chloride: 102 mmol/L (ref 98–110)
Creat: 0.74 mg/dL (ref 0.60–0.95)
Globulin: 2.3 g/dL (calc) (ref 1.9–3.7)
Glucose, Bld: 82 mg/dL (ref 65–99)
Potassium: 4.5 mmol/L (ref 3.5–5.3)
Sodium: 142 mmol/L (ref 135–146)
Total Bilirubin: 0.4 mg/dL (ref 0.2–1.2)
Total Protein: 6.1 g/dL (ref 6.1–8.1)
eGFR: 81 mL/min/{1.73_m2} (ref >=60)

## 2021-11-04 LAB — LIPID PANEL
Cholesterol: 177 mg/dL (ref <200)
HDL: 65 mg/dL (ref >=50)
LDL Cholesterol (Calc): 93 mg/dL (calc)
Non-HDL Cholesterol (Calc): 112 mg/dL (calc) (ref <130)
Total CHOL/HDL Ratio: 2.7 (calc) (ref <5.0)
Triglycerides: 99 mg/dL (ref <150)

## 2021-11-04 LAB — CBC WITH DIFFERENTIAL/PLATELET
Absolute Monocytes: 634 cells/uL (ref 200–950)
Basophils Absolute: 42 cells/uL (ref 0–200)
Basophils Relative: 0.8 %
Eosinophils Absolute: 62 cells/uL (ref 15–500)
Eosinophils Relative: 1.2 %
HCT: 34.3 % — ABNORMAL LOW (ref 35.0–45.0)
Hemoglobin: 11.1 g/dL — ABNORMAL LOW (ref 11.7–15.5)
Lymphs Abs: 1950 cells/uL (ref 850–3900)
MCH: 31.3 pg (ref 27.0–33.0)
MCHC: 32.4 g/dL (ref 32.0–36.0)
MCV: 96.6 fL (ref 80.0–100.0)
MPV: 10 fL (ref 7.5–12.5)
Monocytes Relative: 12.2 %
Neutro Abs: 2512 cells/uL (ref 1500–7800)
Neutrophils Relative %: 48.3 %
Platelets: 248 10*3/uL (ref 140–400)
RBC: 3.55 10*6/uL — ABNORMAL LOW (ref 3.80–5.10)
RDW: 13.8 % (ref 11.0–15.0)
Total Lymphocyte: 37.5 %
WBC: 5.2 10*3/uL (ref 3.8–10.8)

## 2021-11-04 LAB — TSH: TSH: 3.13 mIU/L (ref 0.40–4.50)

## 2021-11-04 LAB — MAGNESIUM: Magnesium: 2.4 mg/dL (ref 1.5–2.5)

## 2021-12-09 DIAGNOSIS — M0579 Rheumatoid arthritis with rheumatoid factor of multiple sites without organ or systems involvement: Secondary | ICD-10-CM | POA: Diagnosis not present

## 2021-12-16 DIAGNOSIS — K4020 Bilateral inguinal hernia, without obstruction or gangrene, not specified as recurrent: Secondary | ICD-10-CM | POA: Diagnosis not present

## 2022-01-10 DIAGNOSIS — M8000XA Age-related osteoporosis with current pathological fracture, unspecified site, initial encounter for fracture: Secondary | ICD-10-CM | POA: Diagnosis not present

## 2022-01-10 DIAGNOSIS — M0579 Rheumatoid arthritis with rheumatoid factor of multiple sites without organ or systems involvement: Secondary | ICD-10-CM | POA: Diagnosis not present

## 2022-01-11 ENCOUNTER — Ambulatory Visit: Payer: Medicare HMO | Admitting: Adult Health

## 2022-01-12 ENCOUNTER — Telehealth: Payer: Self-pay | Admitting: Internal Medicine

## 2022-01-12 NOTE — Progress Notes (Signed)
°  Chronic Care Management   Note  01/12/2022 Name: Dana [name] MRN: [id number] DOB: 05/03/1941  Dana [name] is a 81 y.o. year old female who is a primary care patient of Unk Pinto, MD. I reached out to Irven Easterly by phone today in response to a referral sent by Dana [name]'s PCP, Unk Pinto, MD.   [name] was given information about Chronic Care Management services today including:  CCM service includes personalized support from designated clinical staff supervised by her physician, including individualized plan of care and coordination with other care providers 24/7 contact phone numbers for assistance for urgent and routine care needs. Service will only be billed when office clinical staff spend 20 minutes or more in a month to coordinate care. Only one practitioner may furnish and bill the service in a calendar month. The patient may stop CCM services at any time (effective at the end of the month) by phone call to the office staff.   Patient agreed to services and verbal consent obtained.   Follow up plan: PT IS SCHEDULED FOR INITIAL TELEVISIT 01/17/2022@2PM    Tatjana Dellinger [employer]

## 2022-01-12 NOTE — Chronic Care Management (AMB) (Signed)
°  Chronic Care Management   Outreach Note  01/12/2022 Name: Bonna Steury MRN: [id number] DOB: Dec 18, 1940  Referred by: Unk Pinto, MD Reason for referral : No chief complaint on file.   An unsuccessful telephone outreach was attempted today. The patient was referred to the pharmacist for assistance with care management and care coordination.   Follow Up Plan:   Tatjana Dellinger [employer] Scheduler

## 2022-01-14 ENCOUNTER — Telehealth: Payer: Self-pay

## 2022-01-14 NOTE — Telephone Encounter (Signed)
LM-01/14/22-Called patient to confirm visit w/ CPP. Unable to reach. LVM.  Total time spent: 2 Minutes Vanetta Shawl, [facility]

## 2022-01-17 ENCOUNTER — Telehealth: Payer: Medicare HMO | Admitting: Pharmacist

## 2022-01-18 DIAGNOSIS — E039 Hypothyroidism, unspecified: Secondary | ICD-10-CM | POA: Diagnosis not present

## 2022-01-18 DIAGNOSIS — I10 Essential (primary) hypertension: Secondary | ICD-10-CM | POA: Diagnosis not present

## 2022-01-18 DIAGNOSIS — M85852 Other specified disorders of bone density and structure, left thigh: Secondary | ICD-10-CM | POA: Diagnosis not present

## 2022-01-18 DIAGNOSIS — E782 Mixed hyperlipidemia: Secondary | ICD-10-CM | POA: Diagnosis not present

## 2022-01-24 NOTE — Telephone Encounter (Signed)
01/24/22-Called patient and confirmed upcoming visit w/ pharmacisit and went over precall questions.  ? ?Total time spent: 15 Minutes ?Vanetta Shawl, [facility] ?

## 2022-01-26 ENCOUNTER — Ambulatory Visit: Payer: Medicare HMO | Admitting: Pharmacist

## 2022-01-26 ENCOUNTER — Other Ambulatory Visit: Payer: Self-pay

## 2022-01-26 DIAGNOSIS — R7309 Other abnormal glucose: Secondary | ICD-10-CM

## 2022-01-26 DIAGNOSIS — M85852 Other specified disorders of bone density and structure, left thigh: Secondary | ICD-10-CM

## 2022-01-26 DIAGNOSIS — E039 Hypothyroidism, unspecified: Secondary | ICD-10-CM

## 2022-01-26 DIAGNOSIS — I10 Essential (primary) hypertension: Secondary | ICD-10-CM

## 2022-01-26 DIAGNOSIS — E782 Mixed hyperlipidemia: Secondary | ICD-10-CM

## 2022-01-28 NOTE — Progress Notes (Signed)
Pharmacist Visit   MAUDELLE, CAPOTE [id number] 80 years, Female  DOB: 10/27/1941  M: [id number] Care Team: Elder Cyphers  __________________________________________________ Clinical Summary Patient Risk: High Next CCM Follow Up: 05/10/2022 Next AWV: 07/12/2022 Next PCP Visit: 04/05/2022 Summary for PCP: Dana [name] is an 81 year old female who presents for CCM initial visit. She is doing well other than having some sinus issues this season. Discussed osteopenia with patient, she was initiated on Fosamax 70mg  by rheumatologist but has not started yet. Provided patient education on how to administer in light of levothyroxine. Pt voiced understanding. Pt aware of DEXA and Mammogram and reached out to schedule on same day. Pt waiting to hear back from clinic. Attestation Statement:: CCM Services:  This encounter meets complex CCM services and moderate to high medical decision making.  Prior to outreach and patient consent for Chronic Care Management, I referred this patient for services after reviewing the nominated patient list or from a personal encounter with the patient.  I have personally reviewed this encounter including the documentation in this note and have collaborated with the care management provider regarding care management and care coordination activities to include development and update of the comprehensive care plan I am certifying that I agree with the content of this note and encounter as supervising physician.   Chronic Conditions Patient's Chronic Conditions: Hypertension (HTN), Hypothyroidism, Hyperlipidemia/Dyslipidemia (HLD), Osteopenia or Osteoporosis, Osteoarthritis, Anemia, Diabetes (DM), Anemia, Migraines, Vitamin D deficiency, Prediabetes, Aortic Atherosclerosis,  RA, Heart Disease, Diverticulosis  Disease Assessments Current BP: 141/87 Current HR: 58 taken on: 01/10/2022 Previous BP: 136/62 Previous HR: 103 taken on: 11/03/2021 Weight:  123 BMI: 21.79 Last GFR: 81 taken on: 11/03/2021 Why did the patient present?: CCM initial visit Marital status?: Widowed Retired? Previous work?: Retired at 79. 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 What does the patient do during the day?: Not much due to restrictions in movement with arthritis. housework, cook, wash dishes. Pt doesn't usually go outside due to tremor when walking. Goes to doctors appointments, grocery store, active member at [employer]. Who does the patient spend their time with and what do they do?: Live by herself. She transports  herself anywhere local in [place] but will not drive outside of [place]. For her Rheumatologist visits in [place] her daughter drives her there. Anything local in [place]. Daughter lives 2 doors down. Daughter has 3 cats which patient is allergic to so they don't hang out much and checks in on patient 4 times a week. Calls patient if she cannot come over. Patient talks to friends on the phone. Her son works 60 hours a week, and sees him and his wife occasionally Lifestyle habits such as diet and exercise?: Diet:  Breakfast at 10 am:  eggs and toast, grits, oatmeal, cereal Dinner @ 5:30pm: veggies, meat (not every day), chicken Snacks: ensure, sandwich or oatmeal cookie Water: 3 16 ounce bottles Caffeine: black tea   Exercise: Not much, pt will do what she can tolerate  Pt has difficulty swallowing. she sometimes puts her medicines in applesauce to eat it Alcohol, tobacco, and illicit drug usage?: No What is the patient's sleep pattern?: No sleep issues, Other (with free form text) Details: Patient didnt sleep too well last night due to sinus problem. How many hours per night does patient typically sleep?: 8-9 hours Family, occupational, and living circumstances relevant to overall health?: Her family history includes  Diabetes in her mother and sister; Heart attack (age of  onset: 22) in her son; Heart disease in her father, maternal aunt, maternal uncle, and mother.  Had 5 children Name and location of Current pharmacy: [facility]ly [facility] - [id number] - [place], [place] - [address] Current Rx insurance plan: [employer] Any additional demeanor/mood notes?: Britteny Godfrey is an 81 year old female who presents for CCM initial visit. She is doing well other than having some sinus issues this season.  Hypertension (HTN) Assess this condition today?: Yes Is patient able to obtain BP reading today?: No Goal: <140/90 mmHG Hypertension Stage: Stage 2 (SBP >140 or DBP > 90) Is Patient checking BP at home?: Yes Pt. home BP readings are ranging: <130/60s How often does patient miss taking their blood pressure medications?: Yes, ocassionally. its been a month since forgot. uses a pill box Has patient experienced hypotension, dizziness, falls or bradycardia?: Yes We discussed: Reducing the amount of salt intake to 1500mg /per day., DASH diet:  following a diet emphasizing fruits and vegetables and low-fat dairy products along with whole grains, fish, poultry, and nuts. Reducing red meats and sugars., Other (provide details below) Details: Increase activity. Discussed exercises patient can do with groinial hernias. Assessment:: Uncontrolled Drug: furosemide (LASIX) 40 MG tablet TAKE 1 TABLET 2 TIMES DAILY FOR FLUID RETENTION AND ANKLE SWELLING Assessment: Appropriate, Query Effectiveness Drug: propranolol (INDERAL) 80 MG tablet Take 1 tablet 2 x /day for BP & Tremors Assessment: Appropriate, Query Effectiveness Additional Info: Pt unable to obtain BP reading due to battery died will get this week HC Follow up: Please call patient in 2 weeks and record her home BP readings for the last 2 weeks. Pharmacist Follow up: Home and office BP readings  Hyperlipidemia/Dyslipidemia (HLD) Last Lipid panel on: 11/03/2021 TC (Goal<200): 177 LDL: 93 HDL  (Goal>40): 65 TG (Goal<150): 99 ASCVD 10-year risk?is:: N/A due to Age > 79 Assess this condition today?: Yes LDL Goal: <70 We discussed: How a diet high in plant sterols (fruits/vegetables/nuts/whole grains/legumes) may reduce your cholesterol., Encouraged increasing fiber to a daily intake of 10-25g/day Assessment:: Uncontrolled Drug: None Additional Info: LDL increased from 67 to 93, continue to monitor and lifestyle recommendations Pharmacist Follow up: LFT and lipid panel  Diabetes (DM) Current A1C: 5.2 taken on: 10/06/2021 Previous A1C: 5.2 taken on: 03/15/2020 Type: Pre-Diabetes/Impaired Fasting Glucose Assess this condition today?: Yes Goal A1C: < 6.5 % Type: Pre-Diabetes/Impaired Fasting Glucose We discussed: Low carbohydrate eating plan with an emphasis on whole grains, legumes, nuts, fruits, and vegetables and minimal refined and processed foods. Assessment:: Controlled Drug: None Pharmacist Follow up: A1c and FBG  Osteopenia or Osteoporosis Current T-score: DualFemur Neck Left  -1.9     AP Spine  L1-L3  -0.7    DualFemur   -1.1    taken on: 09/04/2018 Previous T-score: Unknown Current Vitamin D 25-OH: 52 taken on: 05/05/2021 Previous Vitamin D 25-OH: 50 taken on: 10/06/2020 Assess this condition today?: Yes Patient has: Osteopenia In the past 12 months, have you fallen?: Yes What were the circumstances?: 4x in past year. has a walker and cane How many times have you fallen?: 4 times Are there any stairs in or around the home?: Yes Are there any stairs with handrails?: Yes Is the home free of loose throw rugs in walkways, pet beds, electrical cords, etc.?: No Is there adequate lighting in your home to reduce the risk of falls?: Yes FRAX score - Hip (if not  on treatment): Hip Fracture: 5.9% FRAX score - Major Fracture (if not on treatment): Major Osteoporotic Fracture: 19.3% Dietary calcium intake: Vegetables, high milk intake and cereal Risk Factors: Low  BMI Bisphosphonate start: HAs not started yet, will take tomorrow We discussed: Fall prevention, Dietary calcium intake, Proper administration of bisphosphonate, Weight bearing exercises (walking, light weights, resistance training) Patient has tried and failed: Bone density ordered? Did patient complete? Assessment:: Controlled Drug: Cholecalciferol (VITAMIN D) 125 MCG (5000 UT) CAPS Take 10,000 Units by mouth every evening Assessment: Appropriate, Effective, Safe, Accessible Drug: alendronate (FOSAMAX) 70 MG tablet Take 70 mg by mouth once a week. Take with a full glass of water on an empty stomach Assessment: Appropriate, Effective, Safe, Accessible Pharmacist Follow up: Pt to schedule DEXA scan,  Hypothyroidism Current TSH: 3.13 taken on: 11/03/2021 Previous TSH: 2.53 taken on: 05/05/2021 Current T4: Unknown Current T3: Unknown Assess this condition today?: Yes We discussed: Monitoring for signs / symptoms of hypothyroidism (weight gain, fatigue, hair loss, cold intolerance), Proper administration of levothyroxine (empty stomach, 30 min before breakfast and with no other meds / vitamins) Assessment:: Controlled Drug: levothyroxine (SYNTHROID) 100 MCG tablet Take 1 tablet Daily on an empty stomach with only water for 30 minutes & no Antacid meds, Calcium or Magnesium for 4 hours & avoid Biotin Assessment: Appropriate, Effective, Safe, Accessible Pharmacist Follow up: TSH  Exercise, Diet and Non-Drug Coordination Needs Additional exercise counseling points. We discussed: decreasing sedentary behavior, incorporating flexibility, balance, and strength training exercises Additional diet counseling points. We discussed: key components of a low-carb eating plan, key components of the DASH diet Discussed Non-Drug Care Coordination Needs: Yes Does Patient have Medication financial barriers?: No  Accountable Health Communities Health-Related Social Needs Screening Tool -  SDOH   ([employer]) What is your living situation today? (ref #1): I have a steady place to live Think about the place you live. Do you have problems with any of the following? (ref #2): None of the above Within the past 12 months, you worried that your food would run out before you got money to buy more (ref #3): Never true Within the past 12 months, the food you bought just didn't last and you didn't have money to get more (ref #4): Never true In the past 12 months, has lack of reliable transportation kept you from medical appointments, meetings, work or from getting things needed for daily living? (ref #5): Yes In the past 12 months, has the electric, gas, oil, or water company threatened to shut off services in your home? (ref #6): No How often does anyone, including family and friends, physically hurt you? (ref #7): Never (1) How often does anyone, including family and friends, insult or talk down to you? (ref #8): Never (1) How often does anyone, including friends and family, threaten you with harm? (ref #9): Never (1) How often does anyone, including family and friends, scream or curse at you? (ref #10): Never (1)  Engagement Notes Charlett Nose on 01/26/2022 04:40 PM CPP Chart Review: 21 min CPP Office Visit: 96 min CPP Office Visit Documentation: 36 min CPP Coordination of Care: [facility] Care Plan Completion: 17 Minutes CPP Care Plan Review: 12 min  HC F/u: Please call patient in 2 weeks and record her home BP readings for the last 2 weeks. Care Gaps:  Need Breast Cancer Screening and DEXA scan - tried to schedule on same day, they have not gotten back to patient, will call again AWV Not performed - Scheduled for  07/12/22 Shingrix - Pt will get at [facility] Pneumonia -  Pt not ready to take at this time COVID- first dose - Pt not ready to take a this time               Dortha Kern. Abran Richard, PharmD  Clinical Pharmacist   [email or web address]  [phone]

## 2022-02-07 ENCOUNTER — Other Ambulatory Visit: Payer: Self-pay

## 2022-02-07 ENCOUNTER — Encounter: Payer: Self-pay | Admitting: Emergency Medicine

## 2022-02-07 ENCOUNTER — Emergency Department (HOSPITAL_COMMUNITY)
Admission: EM | Admit: 2022-02-07 | Discharge: 2022-02-07 | Disposition: A | Discharge status: Home / Self Care | Payer: Medicare HMO | Attending: Emergency Medicine | Admitting: Emergency Medicine

## 2022-02-07 ENCOUNTER — Emergency Department (HOSPITAL_BASED_OUTPATIENT_CLINIC_OR_DEPARTMENT_OTHER): Payer: Medicare HMO

## 2022-02-07 ENCOUNTER — Emergency Department (HOSPITAL_COMMUNITY): Payer: Medicare HMO

## 2022-02-07 ENCOUNTER — Ambulatory Visit
Admission: EM | Admit: 2022-02-07 | Discharge: 2022-02-07 | Disposition: A | Discharge status: Home / Self Care | Payer: Medicare HMO

## 2022-02-07 ENCOUNTER — Encounter (HOSPITAL_COMMUNITY): Payer: Self-pay

## 2022-02-07 DIAGNOSIS — L539 Erythematous condition, unspecified: Secondary | ICD-10-CM | POA: Insufficient documentation

## 2022-02-07 DIAGNOSIS — M1712 Unilateral primary osteoarthritis, left knee: Secondary | ICD-10-CM | POA: Diagnosis not present

## 2022-02-07 DIAGNOSIS — E039 Hypothyroidism, unspecified: Secondary | ICD-10-CM | POA: Insufficient documentation

## 2022-02-07 DIAGNOSIS — Z79899 Other long term (current) drug therapy: Secondary | ICD-10-CM | POA: Diagnosis not present

## 2022-02-07 DIAGNOSIS — I6782 Cerebral ischemia: Secondary | ICD-10-CM | POA: Diagnosis not present

## 2022-02-07 DIAGNOSIS — S0083XA Contusion of other part of head, initial encounter: Secondary | ICD-10-CM | POA: Diagnosis not present

## 2022-02-07 DIAGNOSIS — W19XXXA Unspecified fall, initial encounter: Secondary | ICD-10-CM

## 2022-02-07 DIAGNOSIS — I10 Essential (primary) hypertension: Secondary | ICD-10-CM | POA: Diagnosis not present

## 2022-02-07 DIAGNOSIS — W01198A Fall on same level from slipping, tripping and stumbling with subsequent striking against other object, initial encounter: Secondary | ICD-10-CM | POA: Insufficient documentation

## 2022-02-07 DIAGNOSIS — G319 Degenerative disease of nervous system, unspecified: Secondary | ICD-10-CM | POA: Diagnosis not present

## 2022-02-07 DIAGNOSIS — M79672 Pain in left foot: Secondary | ICD-10-CM

## 2022-02-07 DIAGNOSIS — M79605 Pain in left leg: Secondary | ICD-10-CM

## 2022-02-07 DIAGNOSIS — S0990XA Unspecified injury of head, initial encounter: Secondary | ICD-10-CM

## 2022-02-07 DIAGNOSIS — Z7982 Long term (current) use of aspirin: Secondary | ICD-10-CM | POA: Insufficient documentation

## 2022-02-07 DIAGNOSIS — M7989 Other specified soft tissue disorders: Secondary | ICD-10-CM | POA: Diagnosis not present

## 2022-02-07 DIAGNOSIS — Y92009 Unspecified place in unspecified non-institutional (private) residence as the place of occurrence of the external cause: Secondary | ICD-10-CM | POA: Insufficient documentation

## 2022-02-07 NOTE — ED Provider Notes (Signed)
?[facility] ? ? ? ?CSN: [id number] ?Arrival date & time: 02/07/22  0915 ? ? ?  ? ?History   ?Chief Complaint ?Chief Complaint  ?Patient presents with  ? Fall  ? Head Injury  ? ? ?HPI ?Dana [name] is a 81 y.o. female.  ? ?Patient presents for further evaluation after a fall that occurred yesterday with associated head injury and left foot pain.  Patient reports that she was trying to fix her shower curtain when her foot got stuck between the toilet and the wall and she fell hitting her head on the tub.  She is also having left foot pain that started after the fall.  Denies losing consciousness.  Patient fell face forward and hit the top of head/forehead on bathtub.  Patient takes 81 mg aspirin but no other blood thinning medication.  Foot pain occurs across the toes.  Patient reports that she has broken this foot before and has been having intermittent pain ever since that occurred.  Denies any numbness or tingling.  Denies any associated headache, blurred vision, dizziness, nausea, vomiting. ? ? ?Fall ? ?Head Injury ? ?Past Medical History:  ?Diagnosis Date  ? Anemia   ? Cataract   ? Diverticulosis 09/03/2020  ? Environmental allergies   ? Gastric ulcer   ? Heartburn   ? Hiatal hernia   ? Hyperlipidemia   ? Hypertension   ? Hypothyroidism   ? Migraines   ? Prediabetes   ? Rash october 2012  ? all over abdomen and breasts  ? Rhabdomyolysis, traumatic   12/09/2020 12/09/2020  ? Rheumatoid arthritis(714.0)   ? Tremor, essential   ? right hand  ? ? ?Patient Active Problem List  ? Diagnosis Date Noted  ? History of basal cell carcinoma (BCC) 05/06/2021  ? History of SCC (squamous cell carcinoma) of skin 05/06/2021  ? Right knee pain 12/10/2020  ? Diverticulosis 09/03/2020  ? Aortic atherosclerosis ([place]) by CT scan 08/2020 08/17/2020  ? Osteopenia 12/05/2018  ? FHx: heart disease 09/03/2018  ? Former smoker (quit 1985) 09/03/2018  ? BMI 21.0-21.9, adult 05/30/2018  ? Generalized OA 09/02/2015  ? Vitamin  D deficiency 01/06/2014  ? Hypertension   ? Other abnormal glucose   ? Rheumatoid arthritis ([place])   ? Hereditary essential tremor   ? Hypothyroidism   ? Migraines   ? Hyperlipidemia, mixed   ? Chronic anemia   ? ? ?Past Surgical History:  ?Procedure Laterality Date  ? ABDOMINAL HYSTERECTOMY    ? CATARACT EXTRACTION, BILATERAL    ? CHOLECYSTECTOMY  08/31/11  ? lap chole   ? COLONOSCOPY    ? TUBAL LIGATION    ? ? ?OB History   ?No obstetric history on file. ?  ? ? ? ?Home Medications   ? ?Prior to Admission medications   ?Medication Sig Start Date End Date Taking? Authorizing Provider  ?acetaminophen (TYLENOL) 325 MG tablet Take 2 tablets (650 mg total) by mouth every 6 (six) hours as needed. 06/26/21   Jaynee Eagles, PA-C  ?alendronate (FOSAMAX) 70 MG tablet Take 70 mg by mouth once a week. Take with a full glass of water on an empty stomach.    [provider]  ?aspirin EC 81 MG tablet Take 81 mg by mouth daily. Swallow whole.    [provider]  ?Cholecalciferol (VITAMIN D) 125 MCG (5000 UT) CAPS Take 10,000 Units by mouth every evening.    [provider]  ?cimetidine (TAGAMET) 200 MG tablet Take  200 mg by mouth 2 (two) times daily.    [provider]  ?Cyanocobalamin (VITAMIN B 12 PO) Place 1,000 mcg under the tongue every evening.    [provider]  ?diazepam (VALIUM) 5 MG tablet TAKE 1 TABLET THREE TIMES DAILY FOR TREMOR ?Patient taking differently: Take 5 mg by mouth daily. TAKE 1 TABLET THREE TIMES DAILY FOR TREMOR 06/24/21   Unk Pinto, MD  ?dicyclomine (BENTYL) 20 MG tablet Take  1 tablet  3 x /day  before Meals  for Cramping, Bloating , Nausea or Diarrhea 01/28/21   Unk Pinto, MD  ?folic acid (FOLVITE) 782 MCG tablet Take 1 tablet daily. ?Patient taking differently: Take 800 mcg by mouth daily. 05/19/20   Unk Pinto, MD  ?furosemide (LASIX) 40 MG tablet TAKE 1 TABLET 2  TIMES DAILY FOR FLUID  RETENTION AND ANKLE  SWELLING 06/24/21   Unk Pinto,  MD  ?hydrocortisone 2.5 % ointment Apply topically 2 (two) times daily.    [provider]  ?hydroxychloroquine (PLAQUENIL) 200 MG tablet Take 200 mg by mouth See admin instructions. Take one tablet (100 mcg) by mouth daily 03/22/17   [provider]  ?levothyroxine (SYNTHROID) 100 MCG tablet Take  1 tablet  Daily  on an empty stomach with only water for 30 minutes & no Antacid meds, Calcium or Magnesium for 4 hours & avoid Biotin ?Patient taking differently: Take 1/2 tablet on Mon and Friday and 1 whole tablet  Daily  on an empty stomach with only water for 30 minutes & no Antacid meds, Calcium or Magnesium for 4 hours & avoid Biotin 06/24/21   Unk Pinto, MD  ?methotrexate (RHEUMATREX) 2.5 MG tablet Take 10 mg by mouth See admin instructions. Take 4 tablets (10 mg) by mouth twice weekly - Tuesday and Wednesday 02/10/17   [provider]  ?montelukast (SINGULAIR) 10 MG tablet TAKE 1 TABLET EVERY DAY 01/13/21   Liane Comber, NP  ?Multiple Vitamin (MULTIVITAMIN WITH MINERALS) TABS tablet Take 1 tablet by mouth every evening.    [provider]  ?ondansetron (ZOFRAN ODT) 8 MG disintegrating tablet '8mg'$  ODT q4 hours prn nausea ?Patient taking differently: Take 8 mg by mouth every 4 (four) hours as needed for nausea or vomiting. 04/19/16   Rolene Course, PA-C  ?polyethylene glycol (MIRALAX / GLYCOLAX) 17 g packet Take 17 g by mouth daily. 12/14/20   Bonnielee Haff, MD  ?propranolol (INDERAL) 80 MG tablet Take  1 tablet  2 x /day  for BP & Tremors 07/20/21   Unk Pinto, MD  ? ? ?Family History ?Family History  ?Problem Relation Age of Onset  ? Heart disease Mother   ? Diabetes Mother   ? Heart disease Father   ? Heart disease Maternal Aunt   ?     x3   ? Heart disease Maternal Uncle   ?     x 4  ? Diabetes Sister   ?     borderline  ? Heart attack Son 22  ?     Sudden cardiac death  ? Colon cancer Neg Hx   ? ? ?Social History ?Social History  ? ?Tobacco Use  ? Smoking status:  Former  ?  Types: Cigarettes  ?  Quit date: 09/18/1984  ?  Years since quitting: 37.4  ? Smokeless tobacco: Never  ? Tobacco comments:  ?  stopped around 25 years ago  ?Substance Use Topics  ? Alcohol use: No  ? Drug use: No  ? ? ? ?  Allergies   ?Codeine, Mysoline [primidone], and Sulfa antibiotics ? ? ?Review of Systems ?Review of Systems ?Per HPI ? ?Physical Exam ?Triage Vital Signs ?ED Triage Vitals [02/07/22 0943]  ?Enc Vitals Group  ?   BP 129/74  ?   Pulse Rate 60  ?   Resp 16  ?   Temp 97.9 ?F (36.6 ?C)  ?   Temp Source Oral  ?   SpO2 96 %  ?   Weight   ?   Height   ?   Head Circumference   ?   Peak Flow   ?   Pain Score   ?   Pain Loc   ?   Pain Edu?   ?   Excl. in [place]?   ? ?No data found. ? ?Updated Vital Signs ?BP 129/74 (BP Location: Left Arm)   Pulse 60   Temp 97.9 ?F (36.6 ?C) (Oral)   Resp 16   SpO2 96%  ? ?Visual Acuity ?Right Eye Distance:   ?Left Eye Distance:   ?Bilateral Distance:   ? ?Right Eye Near:   ?Left Eye Near:    ?Bilateral Near:    ? ?Physical Exam ?Constitutional:   ?   General: She is not in acute distress. ?   Appearance: Normal appearance. She is not toxic-appearing or diaphoretic.  ?HENT:  ?   Head: Normocephalic and atraumatic.  ?Eyes:  ?   Extraocular Movements: Extraocular movements intact.  ?   Conjunctiva/sclera: Conjunctivae normal.  ?Cardiovascular:  ?   Rate and Rhythm: Normal rate and regular rhythm.  ?   Pulses: Normal pulses.  ?   Heart sounds: Normal heart sounds.  ?Pulmonary:  ?   Effort: Pulmonary effort is normal. No respiratory distress.  ?   Breath sounds: Normal breath sounds.  ?Musculoskeletal:  ?   Comments: Deferred due to patient going to the hospital.  ?Skin: ?   Comments: Bruising located to mid forehead.  No bleeding or lacerations noted.  ?Neurological:  ?   General: No focal deficit present.  ?   Mental Status: She is alert and oriented to person, place, and time. Mental status is at baseline.  ?   Cranial Nerves: Cranial nerves 2-12 are intact.  ?    Sensory: Sensation is intact.  ?   Motor: Motor function is intact.  ?   Comments: Walks with a walker at baseline.    ?Psychiatric:     ?   Mood and Affect: Mood normal.     ?   Behavior: Behavior normal.

## 2022-02-07 NOTE — Progress Notes (Signed)
Orthopedic Tech Progress Note ?Patient Details:  ?Irven Easterly ?1941/03/13 ?[id number] ? ?Ortho Devices ?Type of Ortho Device: Postop shoe/boot ?Ortho Device/Splint Location: left ?Ortho Device/Splint Interventions: Application ?  ?Post Interventions ?Patient Tolerated: Well ?Instructions Provided: Care of device ? ?[place] Pink ?02/07/2022, 2:45 PM ? ?

## 2022-02-07 NOTE — Progress Notes (Signed)
LLE venous duplex has been completed.  Preliminary results given to Dr. Doren Custard via secure chat. ? ?Results can be found under chart review under CV PROC. ?02/07/2022 2:16 PM ?Marvon Shillingburg RVT, RDMS ? ?

## 2022-02-07 NOTE — ED Provider Notes (Signed)
?[facility] DEPT ?Provider Note ? ? ?CSN: [id number] ?Arrival date & time: 02/07/22  1038 ? ?  ? ?History ? ?Chief Complaint  ?Patient presents with  ? Fall  ? ? ?Dana [name] is a 81 y.o. female. ? ? ?Fall ?Patient presents for concern of injuries following a fall.  Fall occurred yesterday.  At the time she was fixing a shower curtain at home.  She caught her foot on the toilet causing her to lose her balance and fall.  He does endorse striking her head on the tub but denies loss of consciousness.  She has had pain in the left foot since the fall.  She does not take anticoagulation medication.  Currently she endorses pain in her left forefoot.  She has been ambulatory since the fall.  Patient was seen at urgent care prior to arrival in the ED.  No testing was performed while she was there.  She was advised to present to the emergency department for imaging studies.  Patient also reports that she has had ongoing swelling in her left leg for several months. ? ?  ? ?Home Medications ?Prior to Admission medications   ?Medication Sig Start Date End Date Taking? Authorizing Provider  ?acetaminophen (TYLENOL) 325 MG tablet Take 2 tablets (650 mg total) by mouth every 6 (six) hours as needed. 06/26/21   Jaynee Eagles, PA-C  ?alendronate (FOSAMAX) 70 MG tablet Take 70 mg by mouth once a week. Take with a full glass of water on an empty stomach.    [provider]  ?aspirin EC 81 MG tablet Take 81 mg by mouth daily. Swallow whole.    [provider]  ?Cholecalciferol (VITAMIN D) 125 MCG (5000 UT) CAPS Take 10,000 Units by mouth every evening.    [provider]  ?cimetidine (TAGAMET) 200 MG tablet Take 200 mg by mouth 2 (two) times daily.    [provider]  ?Cyanocobalamin (VITAMIN B 12 PO) Place 1,000 mcg under the tongue every evening.    [provider]  ?diazepam (VALIUM) 5 MG tablet TAKE 1 TABLET THREE TIMES DAILY FOR TREMOR ?Patient taking  differently: Take 5 mg by mouth daily. TAKE 1 TABLET THREE TIMES DAILY FOR TREMOR 06/24/21   Unk Pinto, MD  ?dicyclomine (BENTYL) 20 MG tablet Take  1 tablet  3 x /day  before Meals  for Cramping, Bloating , Nausea or Diarrhea 01/28/21   Unk Pinto, MD  ?folic acid (FOLVITE) 952 MCG tablet Take 1 tablet daily. ?Patient taking differently: Take 800 mcg by mouth daily. 05/19/20   Unk Pinto, MD  ?furosemide (LASIX) 40 MG tablet TAKE 1 TABLET 2  TIMES DAILY FOR FLUID  RETENTION AND ANKLE  SWELLING 06/24/21   Unk Pinto, MD  ?hydrocortisone 2.5 % ointment Apply topically 2 (two) times daily.    [provider]  ?hydroxychloroquine (PLAQUENIL) 200 MG tablet Take 200 mg by mouth See admin instructions. Take one tablet (100 mcg) by mouth daily 03/22/17   [provider]  ?levothyroxine (SYNTHROID) 100 MCG tablet Take  1 tablet  Daily  on an empty stomach with only water for 30 minutes & no Antacid meds, Calcium or Magnesium for 4 hours & avoid Biotin ?Patient taking differently: Take 1/2 tablet on Mon and Friday and 1 whole tablet  Daily  on an empty stomach with only water for 30 minutes & no Antacid meds, Calcium or Magnesium for 4 hours & avoid Biotin 06/24/21   Unk Pinto,  MD  ?methotrexate (RHEUMATREX) 2.5 MG tablet Take 10 mg by mouth See admin instructions. Take 4 tablets (10 mg) by mouth twice weekly - Tuesday and Wednesday 02/10/17   [provider]  ?montelukast (SINGULAIR) 10 MG tablet TAKE 1 TABLET EVERY DAY 01/13/21   Liane Comber, NP  ?Multiple Vitamin (MULTIVITAMIN WITH MINERALS) TABS tablet Take 1 tablet by mouth every evening.    [provider]  ?ondansetron (ZOFRAN ODT) 8 MG disintegrating tablet '8mg'$  ODT q4 hours prn nausea ?Patient taking differently: Take 8 mg by mouth every 4 (four) hours as needed for nausea or vomiting. 04/19/16   Rolene Course, PA-C  ?polyethylene glycol (MIRALAX / GLYCOLAX) 17 g packet Take 17 g by mouth daily. 12/14/20    Bonnielee Haff, MD  ?propranolol (INDERAL) 80 MG tablet Take  1 tablet  2 x /day  for BP & Tremors 07/20/21   Unk Pinto, MD  ?   ? ?Allergies    ?Codeine, Mysoline [primidone], and Sulfa antibiotics   ? ?Review of Systems   ?Review of Systems  ?Cardiovascular:  Positive for leg swelling.  ?Musculoskeletal:  Positive for arthralgias.  ?All other systems reviewed and are negative. ? ?Physical Exam ?Updated Vital Signs ?BP 128/60   Pulse (!) 58   Temp (!) 97.3 ?F (36.3 ?C) (Oral)   Resp 18   SpO2 100%  ?Physical Exam ?Vitals and nursing note reviewed.  ?Constitutional:   ?   General: She is not in acute distress. ?   Appearance: Normal appearance. She is well-developed and normal weight. She is not ill-appearing, toxic-appearing or diaphoretic.  ?HENT:  ?   Head: Normocephalic and atraumatic.  ?   Right Ear: External ear normal.  ?   Left Ear: External ear normal.  ?   Nose: Nose normal.  ?   Mouth/Throat:  ?   Mouth: Mucous membranes are moist.  ?   Pharynx: Oropharynx is clear.  ?   Comments: Baseline tremulous voice.  This is a chronic condition. ?Eyes:  ?   Extraocular Movements: Extraocular movements intact.  ?   Conjunctiva/sclera: Conjunctivae normal.  ?Cardiovascular:  ?   Rate and Rhythm: Normal rate and regular rhythm.  ?   Heart sounds: No murmur heard. ?Pulmonary:  ?   Effort: Pulmonary effort is normal. No respiratory distress.  ?   Breath sounds: Normal breath sounds. No wheezing or rales.  ?Abdominal:  ?   Palpations: Abdomen is soft.  ?   Tenderness: There is no abdominal tenderness.  ?Musculoskeletal:     ?   General: Swelling and tenderness (Left foot) present. No deformity. Normal range of motion.  ?   Cervical back: Normal range of motion and neck supple. No rigidity or tenderness.  ?Skin: ?   General: Skin is warm and dry.  ?   Capillary Refill: Capillary refill takes less than 2 seconds.  ?   Coloration: Skin is not jaundiced or pale.  ?   Findings: Bruising (Forehead) present.  ?    Comments: Erythematous lesion on anterior chest, chronic.  ?Neurological:  ?   General: No focal deficit present.  ?   Mental Status: She is alert and oriented to person, place, and time.  ?   Cranial Nerves: No cranial nerve deficit.  ?   Sensory: No sensory deficit.  ?   Motor: No weakness.  ?   Coordination: Coordination normal.  ?Psychiatric:     ?   Mood and Affect: Mood normal.     ?  Behavior: Behavior normal.     ?   Thought Content: Thought content normal.     ?   Judgment: Judgment normal.  ? ? ?ED Results / Procedures / Treatments   ?Labs ?(all labs ordered are listed, but only abnormal results are displayed) ?Labs Reviewed - No data to display ? ?EKG ?None ? ?Radiology ?CT Head Wo Contrast ? ?Result Date: 02/07/2022 ?CLINICAL DATA:  81 year old female with history of trauma from a fall out of the bath tub. Hematoma in the center of her forehead. EXAM: CT HEAD WITHOUT CONTRAST TECHNIQUE: Contiguous axial images were obtained from the base of the skull through the vertex without intravenous contrast. RADIATION DOSE REDUCTION: This exam was performed according to the departmental dose-optimization program which includes automated exposure control, adjustment of the mA and/or kV according to patient size and/or use of iterative reconstruction technique. COMPARISON:  Head CT 12/09/2020. FINDINGS: Brain: Very mild cerebral atrophy. Patchy areas of mild decreased attenuation are noted throughout the deep and periventricular white matter of the cerebral hemispheres bilaterally, compatible with mild chronic microvascular ischemic disease. No evidence of acute infarction, hemorrhage, hydrocephalus, extra-axial collection or mass lesion/mass effect. Vascular: No hyperdense vessel or unexpected calcification. Skull: Normal. Negative for fracture or focal lesion. Sinuses/Orbits: No acute finding. Other: None. IMPRESSION: 1. No acute intracranial abnormalities. 2. Mild cerebral atrophy with mild chronic microvascular  ischemic changes in the cerebral white matter, as above. Electronically Signed   By: Vinnie Langton M.D.   On: 02/07/2022 12:34  ? ?DG Knee Complete 4 Views Left ? ?Result Date: 02/07/2022 ?CLINICAL DATA:  Frederico Hamman

## 2022-02-07 NOTE — Discharge Instructions (Signed)
Please go to the emergency department as soon as you leave urgent care for further evaluation and management. ?

## 2022-02-07 NOTE — ED Triage Notes (Signed)
Pt presents with c/o fall that occurred yesterday afternoon. Pt reports that she fell in the bathtub. Pt reports she went to [facility] this morning and was told to come here for a scan of her head. Pt also c/o left knee pain and left foot pain. ?

## 2022-02-07 NOTE — ED Notes (Signed)
Pt states understanding of dc instructions, importance of follow up. Pt denies questions or concerns and transportation assistance was provided. No belongings left in room upon dc. ? ?

## 2022-02-07 NOTE — ED Triage Notes (Signed)
Fell hanging shower curtain. Left foot/head injury. On 81 mg aspirin ?

## 2022-02-18 DIAGNOSIS — E782 Mixed hyperlipidemia: Secondary | ICD-10-CM | POA: Diagnosis not present

## 2022-02-18 DIAGNOSIS — E039 Hypothyroidism, unspecified: Secondary | ICD-10-CM | POA: Diagnosis not present

## 2022-02-18 DIAGNOSIS — I10 Essential (primary) hypertension: Secondary | ICD-10-CM | POA: Diagnosis not present

## 2022-02-18 DIAGNOSIS — M85852 Other specified disorders of bone density and structure, left thigh: Secondary | ICD-10-CM | POA: Diagnosis not present

## 2022-02-23 ENCOUNTER — Other Ambulatory Visit: Payer: Self-pay | Admitting: Internal Medicine

## 2022-02-23 DIAGNOSIS — K580 Irritable bowel syndrome with diarrhea: Secondary | ICD-10-CM

## 2022-02-23 DIAGNOSIS — G250 Essential tremor: Secondary | ICD-10-CM

## 2022-03-03 ENCOUNTER — Telehealth: Payer: Self-pay | Admitting: Adult Health

## 2022-03-03 DIAGNOSIS — E039 Hypothyroidism, unspecified: Secondary | ICD-10-CM

## 2022-03-03 MED ORDER — LEVOTHYROXINE SODIUM 100 MCG PO TABS: ORAL_TABLET | ORAL | 0 refills | Status: DC

## 2022-03-03 NOTE — Telephone Encounter (Signed)
[facility] has a delay in sending her Levothyroxine and she has already missed 2 days. Wanting to know if we can send in an emergency supply to the [facility] on file today.  ?

## 2022-03-03 NOTE — Addendum Note (Signed)
Addended by: Chancy Hurter on: 03/03/2022 05:10 PM ? ? Modules accepted: Orders ? ?

## 2022-03-26 DIAGNOSIS — M79672 Pain in left foot: Secondary | ICD-10-CM | POA: Diagnosis not present

## 2022-03-26 DIAGNOSIS — M25561 Pain in right knee: Secondary | ICD-10-CM | POA: Diagnosis not present

## 2022-04-04 NOTE — Patient Instructions (Signed)

## 2022-04-04 NOTE — Progress Notes (Addendum)
? ?Annual Screening/Preventative Visit &  ?Comprehensive Evaluation &  Examination ? ? ?Future Appointments  ?Date Time Provider Department  ?04/05/2022 11:00 AM Unk Pinto, MD [facility]  ?05/10/2022  2:15 PM Carleene Mains, [place] [facility]  ?07/12/2022  2:30 PM Darrol Jump, NP [facility]  ?04/11/2023 11:00 AM Unk Pinto, MD [facility]  ? ? ?    This very nice 81 y.o.WWF  presents for a Screening /Preventative Visit & comprehensive evaluation and management of multiple medical co-morbidities.  Patient has been followed for HTN, HLD, Hypothyroidism, Rheumatoid Arthritis,   Prediabetes  and Vitamin D Deficiency.  Patient has Aortic Atherosclerosis by CXR in 2020 and CT scan in 2021. Patient 's RA (2001) is felt in remission on MTX & Plaquenil followed by Dr Baxter Flattery in [place] ? ? ?     HTN predates since 1998. Patient's BP has been controlled at home and patient denies any cardiac symptoms as chest pain, palpitations, shortness of breath, dizziness or ankle swelling. Today's BP is at goal - 124/60. ? ? ?    Patient's hyperlipidemia is controlled with diet. Last lipids were  at goal : ? ?Lab Results  ?Component Value Date  ? CHOL 177 11/03/2021  ? HDL 65 11/03/2021  ? [place] 93 11/03/2021  ? TRIG 99 11/03/2021  ? CHOLHDL 2.7 11/03/2021  ? ? ? ?    Patient has hx/o prediabetes (2012) and patient denies reactive hypoglycemic symptoms, visual blurring, diabetic polys or paresthesias. Last A1c was normal & at goal . ? ?Lab Results  ?Component Value Date  ? HGBA1C 5.2 10/06/2020  ? ? ?    Patient was dx'd Hypothyroid in 1994 & initiated on replacement therapy. ? ? ?    Finally, patient has history of Vitamin D Deficiency ("17" /2009) and last Vitamin D was not at goal (70-100) : ? ? ?Lab Results  ?Component Value Date  ? VD25OH 52 05/06/2021  ? ? ? ?Current Outpatient Medications on File Prior to Visit  ?Medication Sig  ? acetaminophen ( 325 MG tablet Take 2 tablets  every 6 hours as needed.  ? alendronate  (FOSAMAX) 70 MG tablet Take 70 mg once a week.   ? aspirin EC 81 MG tablet Take daily.  ? VITAMIN D  Take 10,000 Units every evening.  ? cimetidine 200 MG tablet Take 200 mg 2 times daily.  ? VITAMIN B 12 tab   ? diazepam  5 MG tablet   ? dicyclomine 20 MG tablet TAKE 1 TABLET THREE TIMES DAILY   ? folic acid  854 MCG tablet Take 1 tablet daily.   ? furosemide  40 MG tablet TAKE 1 TABLET 2  TIMES DAILY   ? hydrocortisone 2.5 % ointment Apply topically 2 times daily.  ? hydroxychloroquine (PLAQUENIL) 200 MG tablet Take 200 mg Take one tablet  daily  ? levothyroxine  100 MCG tablet Take 1/2 tablet on Mon and Friday and 1 whole tablet  Daily   ? methotrexate (RHEUMATREX) 2.5 MG tablet Take 4 tablets (10 mg) twice weekly - Tuesday and Wednesday  ? montelukast  10 MG tablet TAKE 1 TABLET EVERY DAY  ? Multiple Vitamin w/ MINERALS Take 1 tablet every evening.  ? Ondansetron ODT 8 MG  '8mg'$  ODT q4 hours prn nausea   ? polyethylene glycol  17 g packet Take 17 g  daily.  ? propranolol 80 MG tablet Take  1 tablet  2 x /day  for BP & Tremors  ? ? ? ?  Allergies  ?Allergen Reactions  ? Codeine Nausea Only  ? Mysoline [Primidone] Nausea Only and Other (See Comments)  ?  Over-sedated  ? Sulfa Antibiotics Itching  ? ? ? ?Past Medical History:  ?Diagnosis Date  ? Anemia   ? Cataract   ? Diverticulosis 09/03/2020  ? Environmental allergies   ? Gastric ulcer   ? Heartburn   ? Hiatal hernia   ? Hyperlipidemia   ? Hypertension   ? Hypothyroidism   ? Migraines   ? Prediabetes   ? Rash october 2012  ? all over abdomen and breasts  ? Rhabdomyolysis, traumatic   12/09/2020 12/09/2020  ? Rheumatoid arthritis(714.0)   ? Tremor, essential   ? right hand  ? ? ? ? ? ? ?Immunization History  ?Administered Date(s) Administered  ? DT (Pediatric) 02/05/2015  ? Influenza Split 09/12/2013  ? Influenza, High Dose  10/28/2014, 08/18/2016, 11/22/2018, 12/09/2019, 10/06/2020, 11/03/2021  ? Pneumococcal -13 05/12/2015  ? Pneumococcal-23 11/21/2006  ? Td  11/22/2003  ? Tdap 06/26/2021  ? Varicella 06/23/2011  ? Zoster, Live 06/23/2011  ? ? ?Last Colon - 11/12/2-013 - Dr Deatra Ina - deferred  F/U due to age. ? ? ?Last MGM - 09/10/2019 - overdue ? ? ?Past Surgical History:  ?Procedure Laterality Date  ? ABDOMINAL HYSTERECTOMY    ? CATARACT EXTRACTION, BILATERAL    ? CHOLECYSTECTOMY  08/31/11  ? lap chole   ? COLONOSCOPY    ? TUBAL LIGATION    ? ? ? ?Family History  ?Problem Relation Age of Onset  ? Heart disease Mother   ? Diabetes Mother   ? Heart disease Father   ? Heart disease Maternal Aunt   ?     x3   ? Heart disease Maternal Uncle   ?     x 4  ? Diabetes Sister   ?     borderline  ? Heart attack Son 40  ?     Sudden cardiac death  ? Colon cancer Neg Hx   ? ? ? ?Social History  ? ?Tobacco Use  ? Smoking status: Former  ?  Types: Cigarettes  ?  Quit date: 09/18/1984  ?  Years since quitting: 37.5  ? Smokeless tobacco: Never  ? Tobacco comments:  ?  stopped around 25 years ago  ?Substance Use Topics  ? Alcohol use: No  ? Drug use: No  ? ? ? ? ROS ?Constitutional: Denies fever, chills, weight loss/gain, headaches, insomnia,  night sweats, and change in appetite. Does c/o fatigue. ?Eyes: Denies redness, blurred vision, diplopia, discharge, itchy, watery eyes.  ?ENT: Denies discharge, congestion, post nasal drip, epistaxis, sore throat, earache, hearing loss, dental pain, Tinnitus, Vertigo, Sinus pain, snoring.  ?Cardio: Denies chest pain, palpitations, irregular heartbeat, syncope, dyspnea, diaphoresis, orthopnea, PND, claudication, edema ?Respiratory: denies cough, dyspnea, DOE, pleurisy, hoarseness, laryngitis, wheezing.  ?Gastrointestinal: Denies dysphagia, heartburn, reflux, water brash, pain, cramps, nausea, vomiting, bloating, diarrhea, constipation, hematemesis, melena, hematochezia, jaundice, hemorrhoids ?Genitourinary: Denies dysuria, frequency, urgency, nocturia, hesitancy, discharge, hematuria, flank pain ?Breast: Breast lumps, nipple discharge, bleeding.   ?Musculoskeletal: Denies arthralgia, myalgia, stiffness, Jt. Swelling, pain, limp, and strain/sprain. Denies falls. ?Skin: Denies puritis, rash, hives, warts, acne, eczema, changing in skin lesion ?Neuro: No weakness, tremor, incoordination, spasms, paresthesia, pain ?Psychiatric: Denies confusion, memory loss, sensory loss. Denies Depression. ?Endocrine: Denies change in weight, skin, hair change, nocturia, and paresthesia, diabetic polys, visual blurring, hyper / hypo glycemic episodes.  ?Heme/Lymph: No excessive bleeding, bruising, enlarged lymph nodes. ? ?  Physical Exam ? ?BP 124/60   Pulse 60   Temp 97.7 ?F (36.5 ?C)   Resp 16   Ht 5' 2.25" (1.581 m)   Wt 124 lb 6.4 oz (56.4 kg)   SpO2 99%   BMI 22.57 kg/m?  ? ?General Appearance: Well nourished, well groomed and in no apparent distress. ? ?Eyes: PERRLA, EOMs, conjunctiva no swelling or erythema, normal fundi and vessels. ?Sinuses: No frontal/maxillary tenderness ?ENT/Mouth: EACs patent / TMs  nl. Nares clear without erythema, swelling, mucoid exudates. Oral hygiene is good. No erythema, swelling, or exudate. Tongue normal, non-obstructing. Tonsils not swollen or erythematous. Hearing normal.  ?Neck: Supple, thyroid not palpable. No bruits, nodes or JVD. ?Respiratory: Respiratory effort normal.  BS equal and clear bilateral without rales, rhonci, wheezing or stridor. ?Cardio: Heart sounds are normal with regular rate and rhythm and no murmurs, rubs or gallops. Peripheral pulses are normal and equal bilaterally without edema. No aortic or femoral bruits. ?Chest: symmetric with normal excursions and percussion. ?Breasts: Symmetric, without lumps, nipple discharge, retractions, or fibrocystic changes.  ?Abdomen: Flat, soft with bowel sounds active. Nontender, no guarding, rebound, hernias, masses, or organomegaly.  ?Lymphatics: Non tender without lymphadenopathy.  ?Genitourinary:  ?Musculoskeletal: Full ROM all peripheral extremities, joint stability, 5/5  strength, and normal gait. ?Skin: Warm and dry without rashes, lesions, cyanosis, clubbing or  ecchymosis.  ?Neuro: Cranial nerves intact, reflexes equal bilaterally. Normal muscle tone, no cerebellar symptoms. Sensati

## 2022-04-05 ENCOUNTER — Ambulatory Visit (INDEPENDENT_AMBULATORY_CARE_PROVIDER_SITE_OTHER): Payer: Medicare HMO | Admitting: Internal Medicine

## 2022-04-05 ENCOUNTER — Encounter: Payer: Self-pay | Admitting: Internal Medicine

## 2022-04-05 VITALS — BP 124/60 | HR 60 | Temp 97.7°F | Resp 16 | Ht 62.25 in | Wt 124.4 lb

## 2022-04-05 DIAGNOSIS — Z0001 Encounter for general adult medical examination with abnormal findings: Secondary | ICD-10-CM | POA: Diagnosis not present

## 2022-04-05 DIAGNOSIS — E559 Vitamin D deficiency, unspecified: Secondary | ICD-10-CM

## 2022-04-05 DIAGNOSIS — I10 Essential (primary) hypertension: Secondary | ICD-10-CM

## 2022-04-05 DIAGNOSIS — I700 Atherosclerosis of aorta: Secondary | ICD-10-CM | POA: Diagnosis not present

## 2022-04-05 DIAGNOSIS — M069 Rheumatoid arthritis, unspecified: Secondary | ICD-10-CM

## 2022-04-05 DIAGNOSIS — E032 Hypothyroidism due to medicaments and other exogenous substances: Secondary | ICD-10-CM

## 2022-04-05 DIAGNOSIS — Z87891 Personal history of nicotine dependence: Secondary | ICD-10-CM

## 2022-04-05 DIAGNOSIS — Z8249 Family history of ischemic heart disease and other diseases of the circulatory system: Secondary | ICD-10-CM

## 2022-04-05 DIAGNOSIS — Z0000 Encounter for general adult medical examination without abnormal findings: Secondary | ICD-10-CM | POA: Diagnosis not present

## 2022-04-05 DIAGNOSIS — R7309 Other abnormal glucose: Secondary | ICD-10-CM

## 2022-04-05 DIAGNOSIS — E782 Mixed hyperlipidemia: Secondary | ICD-10-CM

## 2022-04-05 DIAGNOSIS — G250 Essential tremor: Secondary | ICD-10-CM

## 2022-04-05 DIAGNOSIS — Z79899 Other long term (current) drug therapy: Secondary | ICD-10-CM

## 2022-04-05 DIAGNOSIS — Z1211 Encounter for screening for malignant neoplasm of colon: Secondary | ICD-10-CM

## 2022-04-06 LAB — CBC WITH DIFFERENTIAL/PLATELET
Absolute Monocytes: 594 cells/uL (ref 200–950)
Basophils Absolute: 32 cells/uL (ref 0–200)
Basophils Relative: 0.6 %
Eosinophils Absolute: 48 cells/uL (ref 15–500)
Eosinophils Relative: 0.9 %
HCT: 32.9 % — ABNORMAL LOW (ref 35.0–45.0)
Hemoglobin: 10.7 g/dL — ABNORMAL LOW (ref 11.7–15.5)
Lymphs Abs: 1511 cells/uL (ref 850–3900)
MCH: 32.8 pg (ref 27.0–33.0)
MCHC: 32.5 g/dL (ref 32.0–36.0)
MCV: 100.9 fL — ABNORMAL HIGH (ref 80.0–100.0)
MPV: 9.8 fL (ref 7.5–12.5)
Monocytes Relative: 11.2 %
Neutro Abs: 3116 cells/uL (ref 1500–7800)
Neutrophils Relative %: 58.8 %
Platelets: 284 10*3/uL (ref 140–400)
RBC: 3.26 10*6/uL — ABNORMAL LOW (ref 3.80–5.10)
RDW: 15.3 % — ABNORMAL HIGH (ref 11.0–15.0)
Total Lymphocyte: 28.5 %
WBC: 5.3 10*3/uL (ref 3.8–10.8)

## 2022-04-06 LAB — LIPID PANEL
Cholesterol: 165 mg/dL (ref <200)
HDL: 73 mg/dL (ref >=50)
LDL Cholesterol (Calc): 77 mg/dL
Non-HDL Cholesterol (Calc): 92 mg/dL (ref <130)
Total CHOL/HDL Ratio: 2.3 (calc) (ref <5.0)
Triglycerides: 66 mg/dL (ref <150)

## 2022-04-06 LAB — URINALYSIS, ROUTINE W REFLEX MICROSCOPIC
Bacteria, UA: NONE SEEN /HPF
Bilirubin Urine: NEGATIVE
Glucose, UA: NEGATIVE
Hgb urine dipstick: NEGATIVE
Hyaline Cast: NONE SEEN /LPF
Ketones, ur: NEGATIVE
Leukocytes,Ua: NEGATIVE
Nitrite: NEGATIVE
Specific Gravity, Urine: 1.025 (ref 1.001–1.035)
pH: 6 (ref 5.0–8.0)

## 2022-04-06 LAB — VITAMIN D 25 HYDROXY (VIT D DEFICIENCY, FRACTURES): Vit D, 25-Hydroxy: 51 ng/mL (ref 30–100)

## 2022-04-06 LAB — MICROALBUMIN / CREATININE URINE RATIO
Creatinine, Urine: 135 mg/dL (ref 20–275)
Microalb Creat Ratio: 14 mcg/mg creat (ref <30)
Microalb, Ur: 1.9 mg/dL

## 2022-04-06 LAB — COMPLETE METABOLIC PANEL WITHOUT GFR
AG Ratio: 1.8 (calc) (ref 1.0–2.5)
ALT: 13 U/L (ref 6–29)
AST: 21 U/L (ref 10–35)
Albumin: 3.6 g/dL (ref 3.6–5.1)
Alkaline phosphatase (APISO): 79 U/L (ref 37–153)
BUN: 18 mg/dL (ref 7–25)
CO2: 28 mmol/L (ref 20–32)
Calcium: 8.8 mg/dL (ref 8.6–10.4)
Chloride: 107 mmol/L (ref 98–110)
Creat: 0.84 mg/dL (ref 0.60–0.95)
Globulin: 2 g/dL (ref 1.9–3.7)
Glucose, Bld: 77 mg/dL (ref 65–99)
Potassium: 4.3 mmol/L (ref 3.5–5.3)
Sodium: 143 mmol/L (ref 135–146)
Total Bilirubin: 0.5 mg/dL (ref 0.2–1.2)
Total Protein: 5.6 g/dL — ABNORMAL LOW (ref 6.1–8.1)
eGFR: 69 mL/min/1.73m2 (ref >=60)

## 2022-04-06 LAB — HEMOGLOBIN A1C
Hgb A1c MFr Bld: 5 %{Hb} (ref <5.7)
Mean Plasma Glucose: 97 mg/dL
eAG (mmol/L): 5.4 mmol/L

## 2022-04-06 LAB — MICROSCOPIC MESSAGE

## 2022-04-06 LAB — TSH: TSH: 3.98 mIU/L (ref 0.40–4.50)

## 2022-04-06 LAB — MAGNESIUM: Magnesium: 2.1 mg/dL (ref 1.5–2.5)

## 2022-04-06 LAB — INSULIN, RANDOM: Insulin: 6.5 u[IU]/mL

## 2022-04-06 NOTE — Progress Notes (Signed)
<><><><><><><><><><><><><><><><><><><><><><><><><><><><><><><><><> ?<><><><><><><><><><><><><><><><><><><><><><><><><><><><><><><><><> ? ?-    Chronic mild anemia - appears stable ?<><><><><><><><><><><><><><><><><><><><><><><><><><><><><><><><><> ? ?-  Total Chol = 165      &  LDL Chol = 77 --   both  Excellent  ? ?- Very low risk for Heart Attack  / Stroke ?<><><><><><><><><><><><><><><><><><><><><><><><><><><><><><><><><> ? ?-  Vitamin D level - Ok - Please continue dose same  ?<><><><><><><><><><><><><><><><><><><><><><><><><><><><><><><><><> ? ?-  All Else - CBC - Kidneys - Electrolytes - Liver - Magnesium & Thyroid   ? ?- all  Normal / OK ?<><><><><><><><><><><><><><><><><><><><><><><><><><><><><><><><><> ?<><><><><><><><><><><><><><><><><><><><><><><><><><><><><><><><><> ? ? ? ? ? ? ? ? ? ? ? ? ? ? ? ? ? ? ? ? ? ? ?

## 2022-04-14 ENCOUNTER — Ambulatory Visit: Payer: Medicare HMO | Admitting: Adult Health

## 2022-05-02 ENCOUNTER — Other Ambulatory Visit: Payer: Self-pay

## 2022-05-02 DIAGNOSIS — J3089 Other allergic rhinitis: Secondary | ICD-10-CM

## 2022-05-02 MED ORDER — MONTELUKAST SODIUM 10 MG PO TABS: ORAL_TABLET | ORAL | 3 refills | Status: DC

## 2022-05-08 ENCOUNTER — Other Ambulatory Visit: Payer: Self-pay | Admitting: Internal Medicine

## 2022-05-09 ENCOUNTER — Telehealth: Payer: Self-pay

## 2022-05-09 ENCOUNTER — Ambulatory Visit: Payer: Medicare HMO | Admitting: Adult Health

## 2022-05-09 NOTE — Telephone Encounter (Signed)
LM-05/09/22-Called pt. And confirmed CP visit and completed precall questions for visit on 05/10/22 at 2:15PM.  Total time spent: 4 min.

## 2022-05-09 NOTE — Telephone Encounter (Signed)
Chart Prep started, Reviewing OV, Consults, Hospital visits, Labs and medication changes. Chart prep completed.  Total time spent: 40 min

## 2022-05-10 ENCOUNTER — Ambulatory Visit: Payer: Medicare HMO | Admitting: Pharmacy Technician

## 2022-05-10 DIAGNOSIS — Z6821 Body mass index (BMI) 21.0-21.9, adult: Secondary | ICD-10-CM

## 2022-05-10 DIAGNOSIS — D649 Anemia, unspecified: Secondary | ICD-10-CM

## 2022-05-10 DIAGNOSIS — E782 Mixed hyperlipidemia: Secondary | ICD-10-CM

## 2022-05-10 DIAGNOSIS — I10 Essential (primary) hypertension: Secondary | ICD-10-CM

## 2022-05-11 DIAGNOSIS — Z7983 Long term (current) use of bisphosphonates: Secondary | ICD-10-CM | POA: Diagnosis not present

## 2022-05-11 DIAGNOSIS — Z79899 Other long term (current) drug therapy: Secondary | ICD-10-CM | POA: Diagnosis not present

## 2022-05-11 DIAGNOSIS — M8000XA Age-related osteoporosis with current pathological fracture, unspecified site, initial encounter for fracture: Secondary | ICD-10-CM | POA: Diagnosis not present

## 2022-05-11 DIAGNOSIS — M0579 Rheumatoid arthritis with rheumatoid factor of multiple sites without organ or systems involvement: Secondary | ICD-10-CM | POA: Diagnosis not present

## 2022-05-11 NOTE — Telephone Encounter (Signed)
LM-05/11/22-Pharmacist visit notes uploaded to pts. Documents in [employer].  Total time spent: 5 min.

## 2022-05-11 NOTE — Progress Notes (Signed)
Follow Up Pharmacist Visit (v 2.7.5)  Jaquetta, Currier 3 years, Female  DOB: [birth year]  M: [phone]  __________________________________________________ Chronic Conditions Patient's Chronic Conditions: Hypertension (HTN), Hypothyroidism, Osteopenia or Osteoporosis, Hyperlipidemia/Dyslipidemia (HLD), Anemia, Osteoarthritis,Migraines, Diverticulosis, Hereditary essential tremor, RA, Chronic anemia, Right knee pain  Summary for PCP:  1. LDL above goal at 77. Counseled on dietary modifications and weight loss. 2. Patient has chronic anemia but states recently she has no energy and no motivation to do anything. PCP consult about oral iron. 3. No more falls noted.  Disease Assessments Current BP: 124/60 Current HR: 60 taken on: 04/05/2022 Weight: 124 BMI: 22.57 Last GFR: 69 taken on: 04/05/2022 Visit Completed on: 05/10/2022 Why did the patient present?: CCM Follow Up Alcohol, tobacco, and illicit drug usage?: None Factors that may affect medication adherence?: Pill burden Any additional demeanor/mood notes?: Sweet lady presenting via phone for CCM follow up call. She uses a pill box for organization and has no copay barriers. No complaints today other than ongoing knee pain and she wanted to inform me that she is switching Rheumatologists. She sees Dr. Darnell Level tomorrow for the last time and then will transition to Dr. Amil Amen.  SDOH: Accountable Health Communities Health-Related Social Needs Screening Tool ([email or web address]) SDOH questions were documented and reviewed (EMR or Innov[employer]) within the past 3 months?: No What is your living situation today? (ref #1): I have a steady place to live Think about the place you live. Do you have problems with any of the following? (ref #2): None of the above Within the past 12 months, you worried that your food would run out before you got money to buy more (ref #3): Never true Within the  past 12 months, the food you bought just didn't last and you didn't have money to get more (ref #4): Never true In the past 12 months, has lack of reliable transportation kept you from medical appointments, meetings, work or from getting things needed for daily living? (ref #5): No In the past 12 months, has the electric, gas, oil, or water company threatened to shut off services in your home? (ref #6): No How often does anyone, including family and friends, physically hurt you? (ref #7): Never (1) How often does anyone, including family and friends, insult or talk down to you? (ref #8): Never (1) How often does anyone, including friends and family, threaten you with harm? (ref #9): Never (1) How often does anyone, including family and friends, scream or curse at you? (ref #10): Never (1)  Hypertension (HTN) Discussed with patient today?: Yes Is patient able to obtain BP reading today?: No Goal: <130/80 mmHG Hypertension Stage: Elevated (SBP: 120-129 and DBP < 80) Is Patient checking BP at home?: No How often does patient miss taking their blood pressure medications?: None Has patient experienced hypotension, dizziness, falls or bradycardia?: No Check present secondary causes (below) for HTN: Obesity Does Patient use RPM device?: No BP RPM device: Does patient qualify?: No We discussed: DASH diet:  following a diet emphasizing fruits and vegetables and low-fat dairy products along with whole grains, fish, poultry, and nuts. Reducing red meats and sugars., Recommend using a salt substitute to replace your salt if you need flavor., Getting enough potassium in your diet equaling 3500-'5000mg'$ /day.  This helps to regulate BP by balancing out the effects of salt., Weight reduction- We discussed losing 5-10% of body weight., Proper Home BP Measurement, Hypertension pathophysiology, complications, treatment goals, and management with patient for 10-15 minutes, Increasing movement, Increasing exercise  (  walking, biking, swimming) to a goal of 30 minutes per day, as able based on current activity level and health or as directed by your healthcare provider., Contacting PCP office for signs and symptoms of high or low blood pressure (hypotension, dizziness, falls, headaches, edema) Assessment:: Controlled Drug: Propranolol '80mg'$  BID Assessment: Appropriate, Effective, Safe, Accessible Additional Info: Had a mechanical fall from trying to hang a new shower curtain and fell off of what she was standing on. No other falls noted. Plan to Start: Home BP checks three times per week.  Pharmacist Follow up: Stable  Hyperlipidemia/Dyslipidemia (HLD) Last Lipid panel on: 04/05/2022 TC (Goal<200): 165 LDL: 77 HDL (Goal>40): 73 TG (Goal<150): 40 ASCVD 10-year risk?is:: N/A due to Age > 59 Discussed with patient today?: Yes LDL Goal: <70 Has patient tried and failed any HLD Medications?: No Check present secondary causes (below) that can lead to increased cholesterol levels (multi-choice optional): Beta blockers We discussed: Complications of hyperlipidemia and prevention methods with patient for 5-10 minutes, Increasing exercise (walking, biking, swimming) to a goal of 30 minutes per day, as able based on current activity level and health or as directed by your healthcare provider, How a diet high in fruits/vegetables/nuts/whole grains/beans may help to reduce your cholesterol. Increasing soluble fiber intake.  Avoiding sugary foods and trans fat, limiting carbohydrates, and reducing portion sizes. Recommended increasing intake of healthy fats into their diet, Weight reduction- We discussed losing 5-10% of body weight Assessment:: Uncontrolled Drug: None Assessment: Query Appropriateness Plan to Counsel: Counseled on weight loss and dietary modifications to get to LDL goal  Osteopenia or Osteoporosis Current T-score: -1.9 taken on: 09/08/2018 Current Vitamin D 25-OH: 51 taken on: 04/05/2022 Discussed  with patient today?: No Drug: Fosamax '70mg'$  weekly Assessment: Appropriate, Effective, Safe, Accessible  Hypothyroidism Current TSH: 3.98 taken on: 04/05/2022 Current T4: Unknown Current T3: Unknown Discussed with patient today?: No Drug: Levothyroxine 152mg 1/2 tab MF, 1 tab all other days Assessment: Appropriate, Effective, Safe, Accessible  Anemia Discussed with patient today?: Yes What condition are we assessing today?: Anemia Pertinent Labs: Hg 10.7 Other Information: Patient states she feels exhausted and has no motivation to do anything. Looks like Hg is chronically low. Plan to Start: Discuss with PCP if oral iron is necessary  Exercise, Diet and Non-Drug Coordination Needs Additional exercise counseling points. We discussed: aiming to lose 5 to 10% of body weight through lifestyle modifications, incorporating flexibility, balance, and strength training exercises, decreasing sedentary behavior Additional diet counseling points. We discussed: key components of the DASH diet, key components of a low-carb eating plan, aiming to consume at least 8 cups of water day Discussed Non-Drug Care Coordination Needs: Yes Does Patient have Medication financial barriers?: No  CPP Prep: 1104m CPP OV: 3822mCPP Doc: 64m62mClinical Summary Patient Risk: Low Next CCM Follow Up: CCS Transfer Next AWV: 07/12/22  AverMarda StalkerarmD Clinical Pharmacist AverNaida S[place]ightton'@upstream'$ .care [phone]

## 2022-05-20 DIAGNOSIS — D649 Anemia, unspecified: Secondary | ICD-10-CM | POA: Diagnosis not present

## 2022-05-20 DIAGNOSIS — I10 Essential (primary) hypertension: Secondary | ICD-10-CM | POA: Diagnosis not present

## 2022-05-20 DIAGNOSIS — E782 Mixed hyperlipidemia: Secondary | ICD-10-CM | POA: Diagnosis not present

## 2022-05-20 DIAGNOSIS — Z6821 Body mass index (BMI) 21.0-21.9, adult: Secondary | ICD-10-CM | POA: Diagnosis not present

## 2022-06-11 DIAGNOSIS — M1711 Unilateral primary osteoarthritis, right knee: Secondary | ICD-10-CM | POA: Diagnosis not present

## 2022-07-04 DIAGNOSIS — M1991 Primary osteoarthritis, unspecified site: Secondary | ICD-10-CM | POA: Diagnosis not present

## 2022-07-04 DIAGNOSIS — M810 Age-related osteoporosis without current pathological fracture: Secondary | ICD-10-CM | POA: Diagnosis not present

## 2022-07-04 DIAGNOSIS — Z6820 Body mass index (BMI) 20.0-20.9, adult: Secondary | ICD-10-CM | POA: Diagnosis not present

## 2022-07-04 DIAGNOSIS — M0579 Rheumatoid arthritis with rheumatoid factor of multiple sites without organ or systems involvement: Secondary | ICD-10-CM | POA: Diagnosis not present

## 2022-07-07 ENCOUNTER — Ambulatory Visit: Payer: Self-pay | Admitting: Licensed Clinical Social Worker

## 2022-07-07 NOTE — Patient Outreach (Signed)
  Care Coordination   07/07/2022 Name: Dana [name] MRN: [id number] DOB: [birth year]   Care Coordination Outreach Attempts:  An unsuccessful telephone outreach was attempted today to offer the patient information about available care coordination services as a benefit of their health plan.   Follow Up Plan:  Additional outreach attempts will be made to offer the patient care coordination information and services.   Encounter Outcome:  No Answer  Care Coordination Interventions Activated:  No   Care Coordination Interventions:  No, not indicated    Lenor Derrick, MSW  Social Worker [facility]  [phone]

## 2022-07-12 ENCOUNTER — Encounter: Payer: Self-pay | Admitting: Nurse Practitioner

## 2022-07-12 ENCOUNTER — Ambulatory Visit (INDEPENDENT_AMBULATORY_CARE_PROVIDER_SITE_OTHER): Payer: Medicare HMO | Admitting: Nurse Practitioner

## 2022-07-12 VITALS — BP 120/64 | HR 61 | Temp 97.3°F | Ht 62.25 in | Wt 119.0 lb

## 2022-07-12 DIAGNOSIS — R7309 Other abnormal glucose: Secondary | ICD-10-CM | POA: Diagnosis not present

## 2022-07-12 DIAGNOSIS — I10 Essential (primary) hypertension: Secondary | ICD-10-CM | POA: Diagnosis not present

## 2022-07-12 DIAGNOSIS — J3089 Other allergic rhinitis: Secondary | ICD-10-CM | POA: Diagnosis not present

## 2022-07-12 DIAGNOSIS — G250 Essential tremor: Secondary | ICD-10-CM

## 2022-07-12 DIAGNOSIS — M069 Rheumatoid arthritis, unspecified: Secondary | ICD-10-CM

## 2022-07-12 DIAGNOSIS — E559 Vitamin D deficiency, unspecified: Secondary | ICD-10-CM | POA: Diagnosis not present

## 2022-07-12 DIAGNOSIS — I700 Atherosclerosis of aorta: Secondary | ICD-10-CM

## 2022-07-12 DIAGNOSIS — Z79899 Other long term (current) drug therapy: Secondary | ICD-10-CM

## 2022-07-12 DIAGNOSIS — E782 Mixed hyperlipidemia: Secondary | ICD-10-CM | POA: Diagnosis not present

## 2022-07-12 DIAGNOSIS — R6889 Other general symptoms and signs: Secondary | ICD-10-CM | POA: Diagnosis not present

## 2022-07-12 DIAGNOSIS — Z0001 Encounter for general adult medical examination with abnormal findings: Secondary | ICD-10-CM

## 2022-07-12 DIAGNOSIS — E032 Hypothyroidism due to medicaments and other exogenous substances: Secondary | ICD-10-CM | POA: Diagnosis not present

## 2022-07-12 DIAGNOSIS — Z0000 Encounter for general adult medical examination without abnormal findings: Secondary | ICD-10-CM

## 2022-07-12 DIAGNOSIS — Z85828 Personal history of other malignant neoplasm of skin: Secondary | ICD-10-CM

## 2022-07-12 DIAGNOSIS — M85852 Other specified disorders of bone density and structure, left thigh: Secondary | ICD-10-CM

## 2022-07-12 DIAGNOSIS — D649 Anemia, unspecified: Secondary | ICD-10-CM

## 2022-07-12 NOTE — Patient Instructions (Signed)

## 2022-07-12 NOTE — Progress Notes (Signed)
MEDICARE ANNUAL WELLNESS VISIT AND FOLLOW UP  Assessment:   Dana [name] was seen today for follow-up and medicare wellness.  Diagnoses and all orders for this visit:  Encounter for Medicare annual wellness exam Due annually  Atherosclerosis of aorta ([place]) Per CT 2021 Control blood pressure, cholesterol, glucose, increase exercise.   Essential hypertension Discussed DASH (Dietary Approaches to Stop Hypertension) DASH diet is lower in sodium than a typical American diet. Cut back on foods that are high in saturated fat, cholesterol, and trans fats. Eat more whole-grain foods, fish, poultry, and nuts Remain active and exercise as tolerated daily.  Monitor BP at home-Call if greater than 130/80.  Check CMP/CBC   Hyperlipidemia, mixed Discussed lifestyle modifications. Recommended diet heavy in fruits and veggies, omega 3's. Decrease consumption of animal meats, cheeses, and dairy products. Remain active and exercise as tolerated. Continue to monitor. Check lipids/TSH  Hypothyroidism, unspecified type Controlled. Continue Levothyroxine. Reminded to take on an empty stomach 30-53mns before food.  Stop any Biotin Supplement 48-72 hours before next TSH level to reduce the risk of falsely low TSH levels. Continue to monitor.     Tremor, essential Taking Inderol and valium for this Doing well Managing, no complications at this time. Continue to monitor   Other abnormal glucose; hx of prediabetes Education: Reviewed 'ABCs' of diabetes management  Discussed goals to be met and/or maintained include A1C (<7) Blood pressure (<130/80) Cholesterol (LDL <70) Continue Eye Exam yearly  Continue Dental Exam Q6 mo Discussed dietary recommendations Discussed Physical Activity recommendations Foot exam UTD Check A1C   Osteopenia of left hip Pursue a combination of weight-bearing exercises and strength training. Patients with severe mobility impairment should be referred for  physical therapy. Advised on fall prevention measures including proper lighting in all rooms, removal of area rugs and floor clutter, use of walking devices as deemed appropriate, avoidance of uneven walking surfaces. Smoking cessation and moderate alcohol consumption if applicable Consume 8672to 1000 IU of vitamin D daily with a goal vitamin D serum value of 30 ng/mL or higher. Aim for 1000 to 1200 mg of elemental calcium daily through supplements and/or dietary sources.   Vitamin D deficiency Continue supplimentation Check levels  Non-seasonal allergic rhinitis, unspecified trigger Continue Singulair Avoid triggers No recent flare. Controlled.  Medication management All medications discussed and reviewed in full. All questions and concerns regarding medications addressed.    [place]he[place]oid Arth[place] ([place] is managing; doing well on current meds  Hiscory of SCC/BCC Dr. LLedell Peoplesoffice follows   Anemia Normocytic Continue to monitor  Orders Placed This Encounter  Procedures   CBC with Differential/Platelet   TSH   COMPLETE METABOLIC PANEL WITH GFR   Over 40 minutes of exam, counseling, chart review and critical decision making was performed Future Appointments  Date Time Provider [place] 10/20/2022  2:30 PM [name] NP [facility] None  04/11/2023 11:00 AM [name] [facility] None  07/13/2023  4:00 PM [name] NP [facility] None     Plan:   During the course of the visit the patient was educated and counseled about appropriate screening and preventive services including:   Pneumococcal vaccine  Prevnar 13 Influenza vaccine Td vaccine Screening electrocardiogram Bone densitometry screening Colorectal cancer screening Diabetes screening Glaucoma screening Nutrition counseling  Advanced directives: requested   Subjective:  Dana Schaffertis a 81y.o. female who presents for Medicare Annual Wellness  Visit and 3 month follow up for HTN, HLD, Hypothyroidism, Pre-Diabetes, Vitamin  D Defciency, RA, and hereditary essential tremor.  Overall she reports feeling well today.  She has no new concerns.   She lives alone.  Her son and daughter visit occasionally.  She was hospitalized in Jan 2022 after she fell at home and was found several hours later by family; had rhabdo with CPK ^^ 1716 which has since resolved following IVF; incidentally + covid 19 without clear sx; questionable UTI that was treated. Had R knee contusion with suspected hemarthrosis, was recommended conservative interventions only and to follow up with Dr. Norm Salt. Did complete PT in home for recovery, reports doing well since, continues with home balance exercises. Uses cane and denies falls since. Daughter checks on her regularly.   She is following with Dr. Allyson Sabal for cryo for precancerous lesions.  No new lesions noted.  Patient diagnosed with RA in 2001 and stable on MTX with folica acid and plaquenil and follows with [facility].  She has an essential tremor and taking propanolol for this 42m TID, also valium PRN. Has not worsened over time.   BMI is Body mass index is 21.59 kg/m., she has not been working on diet and exercise, she does PT/strengthening exercises regularly since fall. She is down 5 lb in the last 3 mo. Wt Readings from Last 3 Encounters:  07/12/22 119 lb (54 kg)  04/05/22 124 lb 6.4 oz (56.4 kg)  11/03/21 123 lb (55.8 kg)   She has had elevated blood pressure since 1998. Her blood pressure has been controlled at home, today their BP is BP: 120/64 She does not workout. She denies chest pain, shortness of breath, dizziness.   She has aortic atherosclerosis per CT 08/2020.   She is not on cholesterol medication (she stopped taking pravastatin 40 mg daily 1 year ago) and denies myalgias. Her cholesterol is at goal. The cholesterol last visit was:   Lab Results  Component Value Date   CHOL 165 04/05/2022    HDL 73 04/05/2022   LDLCALC 77 04/05/2022   TRIG 66 04/05/2022   CHOLHDL 2.3 04/05/2022   She has had brief intermittent hx prediabetes since 2012 (5.8% in 2015). She has been working on diet and exercise for glucose management, and denies hyperglycemia, hypoglycemia , increased appetite, nausea, paresthesia of the feet, polydipsia and polyuria. Last A1C in the office was:  Lab Results  Component Value Date   HGBA1C 5.0 04/05/2022   She is on thyroid medication. Last TSH WNL Lab Results  Component Value Date   TSH 3.98 04/05/2022   Last GFR: Lab Results  Component Value Date   GFRNONAA 73 05/06/2021   Patient is on Vitamin D supplement.   Lab Results  Component Value Date   VD25OH 51 04/05/2022     She has chronic normocytic anemia; she is on BC37and folic acid supplements;  Lab Results  Component Value Date   WBC 5.3 04/05/2022   HGB 10.7 (L) 04/05/2022   HCT 32.9 (L) 04/05/2022   MCV 100.9 (H) 04/05/2022   PLT 284 04/05/2022      Medication Review: Current Outpatient Medications on File Prior to Visit  Medication Sig Dispense Refill   acetaminophen (TYLENOL) 325 MG tablet Take 2 tablets (650 mg total) by mouth every 6 (six) hours as needed. 30 tablet 0   alendronate (FOSAMAX) 70 MG tablet Take 70 mg by mouth once a week. Take with a full glass of water on an empty stomach.     aspirin EC 81 MG tablet Take  81 mg by mouth daily. Swallow whole.     Cholecalciferol (VITAMIN D) 125 MCG (5000 UT) CAPS Take 10,000 Units by mouth every evening.     cimetidine (TAGAMET) 200 MG tablet Take 200 mg by mouth 2 (two) times daily.     Cyanocobalamin (VITAMIN B 12 PO) Place 1,000 mcg under the tongue every evening.     diazepam (VALIUM) 5 MG tablet TAKE 1 TABLET THREE TIMES DAILY  FOR  TREMOR 90 tablet 0   dicyclomine (BENTYL) 20 MG tablet TAKE 1 TABLET THREE TIMES DAILY BEFORE MEALS FOR CRAMPING, BLOATING, NAUSEA OR DIARRHEA 161 tablet 0   folic acid (FOLVITE) 096 MCG tablet Take 1  tablet daily. (Patient taking differently: Take 800 mcg by mouth daily.) 90 tablet 1   furosemide (LASIX) 40 MG tablet TAKE 1 TABLET 2  TIMES DAILY FOR FLUID  RETENTION AND ANKLE  SWELLING 180 tablet 3   hydrocortisone 2.5 % ointment Apply topically 2 (two) times daily.     hydroxychloroquine (PLAQUENIL) 200 MG tablet Take 200 mg by mouth See admin instructions. Take one tablet (100 mcg) by mouth daily     levothyroxine (SYNTHROID) 100 MCG tablet Take 1/2 tablet on Mon and Friday and 1 whole tablet  Daily  on an empty stomach with only water for 30 minutes & no Antacid meds, Calcium or Magnesium for 4 hours & avoid Biotin 10 tablet 0   methotrexate (RHEUMATREX) 2.5 MG tablet Take 10 mg by mouth See admin instructions. Take 4 tablets (10 mg) by mouth twice weekly - Tuesday and Wednesday     montelukast (SINGULAIR) 10 MG tablet TAKE 1 TABLET EVERY DAY 90 tablet 3   Multiple Vitamin (MULTIVITAMIN WITH MINERALS) TABS tablet Take 1 tablet by mouth every evening.     ondansetron (ZOFRAN ODT) 8 MG disintegrating tablet 42m ODT q4 hours prn nausea (Patient taking differently: Take 8 mg by mouth every 4 (four) hours as needed for nausea or vomiting.) 30 tablet 3   polyethylene glycol (MIRALAX / GLYCOLAX) 17 g packet Take 17 g by mouth daily. 14 each 0   propranolol (INDERAL) 80 MG tablet TAKE 1 TABLET TWICE DAILY FOR BLOOD PRESSURE AND TREMORS 180 tablet 3   No current facility-administered medications on file prior to visit.    Allergies  Allergen Reactions   Codeine Nausea Only   Mysoline [Primidone] Nausea Only and Other (See Comments)    Over-sedated   Sulfa Antibiotics Itching    Current Problems (verified) Patient Active Problem List   Diagnosis Date Noted   History of basal cell carcinoma (BCC) 05/06/2021   History of SCC (squamous cell carcinoma) of skin 05/06/2021   Right knee pain 12/10/2020   Diverticulosis 09/03/2020   [place]2020 08/17/2020    Osteopenia 12/05/2018   FHx: heart disease 09/03/2018   Former smoker (quit 1985) 09/03/2018   BMI 21.0-21.9, adult 05/30/2018   Generalized OA 09/02/2015   Vitamin D deficiency 01/06/2014   Hypertension    Rheumatoid arthritis ([place]    Hereditary essential tremor    Hypothyroidism    Migraines    Hyperlipidemia, mixed    Chronic anemia     Screening Tests Immunization History  Administered Date(s) Administered   DT (Pediatric) 02/05/2015   Influenza Split 09/12/2013   Influenza, High Dose Seasonal PF 10/28/2014, 08/18/2016, 11/22/2018, 12/09/2019, 10/06/2020, 11/03/2021   Pneumococcal Conjugate-13 05/12/2015   Pneumococcal-Unspecified 11/21/2006   Td 11/22/2003   Tdap 06/26/2021  Varicella 06/23/2011   Zoster, Live 06/23/2011     Preventative care: Last colonoscopy: 2013, normal except mild diverticulosis of sigmoid; DONE Last mammogram: 08/2019  Last pap smear/pelvic exam: Hysterectomy   DEXA: 08/2018 L fem neck T-1.9 - defers further work up  Prior vaccinations: TD or Tdap: 2016  Influenza: Due 2023 Pneumococcal: 2008 Prevnar13: 2016 Shingles/Zostavax: 2017 declines shingrix  Covid 19: declines   Names of Other Physician/Practitioners you currently use: 1. [facility] here for primary care 2. Eye Exam, Dr. Ellie Lunch every six months, has upcoming 06/2021 3. Dentist, several years ago, overdue, reminded to scheduel  Patient Care Team: Unk Pinto, MD as PCP - General (Internal Medicine) Roetta Sessions, MD as Referring Physician (Internal Medicine) Newton Pigg, [facility] as Pharmacist (Pharmacist)  SURGICAL HISTORY She  has a past surgical history that includes Abdominal hysterectomy; Tubal ligation; Cataract extraction, bilateral; Cholecystectomy (08/31/11); and Colonoscopy. FAMILY HISTORY Her family history includes Diabetes in her mother and sister; Heart attack (age of onset: 69) in her son; Heart disease in  her father, maternal aunt, maternal uncle, and mother. SOCIAL HISTORY She  reports that she quit smoking about 37 years ago. Her smoking use included cigarettes. She has never used smokeless tobacco. She reports that she does not drink alcohol and does not use drugs.   MEDICARE WELLNESS OBJECTIVES: Physical activity:   Cardiac risk factors:   Depression/mood screen:      07/12/2022   10:41 PM  Depression screen PHQ 2/9  Decreased Interest 0  Down, Depressed, Hopeless 0  PHQ - 2 Score 0    ADLs:     07/12/2022   10:40 PM  In your present state of health, do you have any difficulty performing the following activities:  Hearing? 0  Vision? 0  Difficulty concentrating or making decisions? 0  Walking or climbing stairs? 0  Dressing or bathing? 0  Doing errands, shopping? 0  Preparing Food and eating ? N  Using the Toilet? N  In the past six months, have you accidently leaked urine? N  Do you have problems with loss of bowel control? N  Managing your Medications? N  Managing your Finances? N  Housekeeping or managing your Housekeeping? N     Cognitive Testing  Alert? Yes  Normal Appearance?Yes  Oriented to person? Yes  Place? Yes   Time? Yes  Recall of three objects?  Yes  Can perform simple calculations? Yes  Displays appropriate judgment?Yes  Can read the correct time from a watch face?Yes  EOL planning: Does Patient Have a Medical Advance Directive?: No Would patient like information on creating a medical advance directive?: No - Patient declined  Review of Systems  Constitutional:  Negative for malaise/fatigue and weight loss.  HENT:  Negative for hearing loss and tinnitus.   Eyes:  Negative for blurred vision and double vision.  Respiratory:  Negative for cough, sputum production, shortness of breath and wheezing.   Cardiovascular:  Negative for chest pain, palpitations, orthopnea, claudication, leg swelling and PND.  Gastrointestinal:  Negative for abdominal pain,  blood in stool, constipation, diarrhea, heartburn, melena, nausea and vomiting.  Genitourinary: Negative.   Musculoskeletal:  Negative for falls, joint pain and myalgias.  Skin:  Negative for rash.  Neurological:  Positive for tremors (stable). Negative for dizziness, tingling, sensory change, weakness and headaches.  Endo/Heme/Allergies:  Negative for polydipsia.  Psychiatric/Behavioral: Negative.  Negative for depression, memory loss, substance abuse and suicidal ideas. The patient is not nervous/anxious  and does not have insomnia.   All other systems reviewed and are negative.    Objective:     Today's Vitals   07/12/22 1438  BP: 120/64  Pulse: 61  Temp: (!) 97.3 F (36.3 C)  SpO2: 99%  Weight: 119 lb (54 kg)  Height: 5' 2.25" (1.581 m)   Body mass index is 21.59 kg/m.  General appearance: alert, no distress, WD/WN, female HEENT: normocephalic, sclerae anicteric, TMs pearly, nares patent, no discharge or erythema, pharynx normal Oral cavity: MMM, no lesions Neck: supple, no lymphadenopathy, no thyromegaly, no masses Heart: RRR, normal S1, S2, no murmurs Lungs: CTA bilaterally, no wheezes, rhonchi, or rales Abdomen: +bs, soft, non tender, non distended, no masses, no hepatomegaly, no splenomegaly Musculoskeletal: nontender, no swelling, no obvious deformity Extremities: no edema, no cyanosis, no clubbing Pulses: 2+ symmetric, upper and lower extremities, normal cap refill Neurological: alert, oriented x 3, CN2-12 intact, strength normal upper extremities and lower extremities, sensation normal throughout, DTRs 2+ throughout, no cerebellar signs, gait slow steady, subtle head and vocal tremor Psychiatric: normal affect, behavior normal, pleasant   Medicare Attestation I have personally reviewed: The patient's medical and social history Their use of alcohol, tobacco or illicit drugs Their current medications and supplements The patient's functional ability including  ADLs,fall risks, home safety risks, cognitive, and hearing and visual impairment Diet and physical activities Evidence for depression or mood disorders  The patient's weight, height, BMI, and visual acuity have been recorded in the chart.  I have made referrals, counseling, and provided education to the patient based on review of the above and I have provided the patient with a written personalized care plan for preventive services.     Darrol Jump, NP   07/12/2022

## 2022-07-13 LAB — CBC WITH DIFFERENTIAL/PLATELET
Absolute Monocytes: 449 cells/uL (ref 200–950)
Basophils Absolute: 29 cells/uL (ref 0–200)
Basophils Relative: 0.7 %
Eosinophils Absolute: 71 cells/uL (ref 15–500)
Eosinophils Relative: 1.7 %
HCT: 32.6 % — ABNORMAL LOW (ref 35.0–45.0)
Hemoglobin: 10.8 g/dL — ABNORMAL LOW (ref 11.7–15.5)
Lymphs Abs: 1701 cells/uL (ref 850–3900)
MCH: 33.6 pg — ABNORMAL HIGH (ref 27.0–33.0)
MCHC: 33.1 g/dL (ref 32.0–36.0)
MCV: 101.6 fL — ABNORMAL HIGH (ref 80.0–100.0)
MPV: 10 fL (ref 7.5–12.5)
Monocytes Relative: 10.7 %
Neutro Abs: 1949 cells/uL (ref 1500–7800)
Neutrophils Relative %: 46.4 %
Platelets: 249 10*3/uL (ref 140–400)
RBC: 3.21 10*6/uL — ABNORMAL LOW (ref 3.80–5.10)
RDW: 15.2 % — ABNORMAL HIGH (ref 11.0–15.0)
Total Lymphocyte: 40.5 %
WBC: 4.2 10*3/uL (ref 3.8–10.8)

## 2022-07-13 LAB — COMPLETE METABOLIC PANEL WITH GFR
AG Ratio: 1.8 (calc) (ref 1.0–2.5)
ALT: 9 U/L (ref 6–29)
AST: 20 U/L (ref 10–35)
Albumin: 3.5 g/dL — ABNORMAL LOW (ref 3.6–5.1)
Alkaline phosphatase (APISO): 79 U/L (ref 37–153)
BUN: 10 mg/dL (ref 7–25)
CO2: 32 mmol/L (ref 20–32)
Calcium: 8.7 mg/dL (ref 8.6–10.4)
Chloride: 105 mmol/L (ref 98–110)
Creat: 0.8 mg/dL (ref 0.60–0.95)
Globulin: 1.9 g/dL (calc) (ref 1.9–3.7)
Glucose, Bld: 81 mg/dL (ref 65–99)
Potassium: 3.9 mmol/L (ref 3.5–5.3)
Sodium: 143 mmol/L (ref 135–146)
Total Bilirubin: 0.3 mg/dL (ref 0.2–1.2)
Total Protein: 5.4 g/dL — ABNORMAL LOW (ref 6.1–8.1)
eGFR: 73 mL/min/{1.73_m2} (ref >=60)

## 2022-07-13 LAB — TSH: TSH: 1.27 mIU/L (ref 0.40–4.50)

## 2022-07-27 DIAGNOSIS — Z79899 Other long term (current) drug therapy: Secondary | ICD-10-CM | POA: Diagnosis not present

## 2022-07-27 DIAGNOSIS — H26492 Other secondary cataract, left eye: Secondary | ICD-10-CM | POA: Diagnosis not present

## 2022-07-27 DIAGNOSIS — H52203 Unspecified astigmatism, bilateral: Secondary | ICD-10-CM | POA: Diagnosis not present

## 2022-07-27 DIAGNOSIS — Z961 Presence of intraocular lens: Secondary | ICD-10-CM | POA: Diagnosis not present

## 2022-08-10 DIAGNOSIS — M1711 Unilateral primary osteoarthritis, right knee: Secondary | ICD-10-CM | POA: Diagnosis not present

## 2022-08-15 ENCOUNTER — Telehealth: Payer: Self-pay | Admitting: Nurse Practitioner

## 2022-08-15 ENCOUNTER — Other Ambulatory Visit: Payer: Self-pay | Admitting: Nurse Practitioner

## 2022-08-15 DIAGNOSIS — J3089 Other allergic rhinitis: Secondary | ICD-10-CM

## 2022-08-15 MED ORDER — MONTELUKAST SODIUM 10 MG PO TABS: ORAL_TABLET | ORAL | 3 refills | Status: DC

## 2022-08-15 NOTE — Telephone Encounter (Signed)
Patient is requesting a refill on Montelukast through [facility] order for a 90 day supply.

## 2022-08-17 DIAGNOSIS — S52232A Displaced oblique fracture of shaft of left ulna, initial encounter for closed fracture: Secondary | ICD-10-CM | POA: Diagnosis not present

## 2022-08-17 DIAGNOSIS — M1711 Unilateral primary osteoarthritis, right knee: Secondary | ICD-10-CM | POA: Diagnosis not present

## 2022-08-24 DIAGNOSIS — M1711 Unilateral primary osteoarthritis, right knee: Secondary | ICD-10-CM | POA: Diagnosis not present

## 2022-08-26 ENCOUNTER — Other Ambulatory Visit: Payer: Self-pay | Admitting: Internal Medicine

## 2022-08-26 DIAGNOSIS — R609 Edema, unspecified: Secondary | ICD-10-CM

## 2022-09-21 DIAGNOSIS — S52232A Displaced oblique fracture of shaft of left ulna, initial encounter for closed fracture: Secondary | ICD-10-CM | POA: Diagnosis not present

## 2022-09-21 DIAGNOSIS — M1711 Unilateral primary osteoarthritis, right knee: Secondary | ICD-10-CM | POA: Diagnosis not present

## 2022-09-27 ENCOUNTER — Encounter: Payer: Self-pay | Admitting: Internal Medicine

## 2022-10-11 ENCOUNTER — Other Ambulatory Visit: Payer: Self-pay | Admitting: Nurse Practitioner

## 2022-10-11 ENCOUNTER — Telehealth: Payer: Self-pay | Admitting: Nurse Practitioner

## 2022-10-11 DIAGNOSIS — E039 Hypothyroidism, unspecified: Secondary | ICD-10-CM

## 2022-10-11 MED ORDER — LEVOTHYROXINE SODIUM 100 MCG PO TABS: ORAL_TABLET | ORAL | 3 refills | Status: DC

## 2022-10-11 NOTE — Telephone Encounter (Signed)
Patient is requesting a refill on Levothyroxine to [facility] on [address].

## 2022-10-12 DIAGNOSIS — S52232A Displaced oblique fracture of shaft of left ulna, initial encounter for closed fracture: Secondary | ICD-10-CM | POA: Diagnosis not present

## 2022-10-20 ENCOUNTER — Ambulatory Visit: Payer: Self-pay | Admitting: Nurse Practitioner

## 2022-10-27 ENCOUNTER — Ambulatory Visit (INDEPENDENT_AMBULATORY_CARE_PROVIDER_SITE_OTHER): Payer: Medicare HMO | Admitting: Nurse Practitioner

## 2022-10-27 VITALS — BP 100/60 | HR 46 | Temp 97.3°F | Ht 62.25 in | Wt 116.4 lb

## 2022-10-27 DIAGNOSIS — D649 Anemia, unspecified: Secondary | ICD-10-CM

## 2022-10-27 DIAGNOSIS — I10 Essential (primary) hypertension: Secondary | ICD-10-CM | POA: Diagnosis not present

## 2022-10-27 DIAGNOSIS — Z23 Encounter for immunization: Secondary | ICD-10-CM | POA: Diagnosis not present

## 2022-10-27 DIAGNOSIS — M069 Rheumatoid arthritis, unspecified: Secondary | ICD-10-CM | POA: Diagnosis not present

## 2022-10-27 DIAGNOSIS — S52209D Unspecified fracture of shaft of unspecified ulna, subsequent encounter for closed fracture with routine healing: Secondary | ICD-10-CM

## 2022-10-27 DIAGNOSIS — E782 Mixed hyperlipidemia: Secondary | ICD-10-CM | POA: Diagnosis not present

## 2022-10-27 DIAGNOSIS — E039 Hypothyroidism, unspecified: Secondary | ICD-10-CM

## 2022-10-27 DIAGNOSIS — G250 Essential tremor: Secondary | ICD-10-CM

## 2022-10-27 DIAGNOSIS — Z85828 Personal history of other malignant neoplasm of skin: Secondary | ICD-10-CM | POA: Diagnosis not present

## 2022-10-27 DIAGNOSIS — R239 Unspecified skin changes: Secondary | ICD-10-CM

## 2022-10-27 DIAGNOSIS — J3089 Other allergic rhinitis: Secondary | ICD-10-CM | POA: Diagnosis not present

## 2022-10-27 DIAGNOSIS — Z79899 Other long term (current) drug therapy: Secondary | ICD-10-CM | POA: Diagnosis not present

## 2022-10-27 DIAGNOSIS — R5381 Other malaise: Secondary | ICD-10-CM

## 2022-10-27 DIAGNOSIS — G8929 Other chronic pain: Secondary | ICD-10-CM

## 2022-10-27 DIAGNOSIS — M25561 Pain in right knee: Secondary | ICD-10-CM

## 2022-10-27 DIAGNOSIS — R296 Repeated falls: Secondary | ICD-10-CM

## 2022-10-27 MED ORDER — TRIAMCINOLONE ACETONIDE 0.025 % EX OINT: 1.0000 | TOPICAL_OINTMENT | Freq: Two times a day (BID) | CUTANEOUS | 0 refills | Status: AC

## 2022-10-27 NOTE — Progress Notes (Unsigned)
FOLLOW UP  Assessment:   Dana [name] was seen today for follow-up   Diagnoses and all orders for this visit:  Essential hypertension Discussed DASH (Dietary Approaches to Stop Hypertension) DASH diet is lower in sodium than a typical American diet. Cut back on foods that are high in saturated fat, cholesterol, and trans fats. Eat more whole-grain foods, fish, poultry, and nuts Remain active and exercise as tolerated daily.  Monitor BP at home-Call if greater than 130/80.  Check CMP/CBC   Hyperlipidemia, mixed Discussed lifestyle modifications. Recommended diet heavy in fruits and veggies, omega 3's. Decrease consumption of animal meats, cheeses, and dairy products. Remain active and exercise as tolerated. Continue to monitor. Check lipids/TSH  Hypothyroidism, unspecified type Controlled. Continue Levothyroxine. Reminded to take on an empty stomach 30-61mns before food.  Stop any Biotin Supplement 48-72 hours before next TSH level to reduce the risk of falsely low TSH levels. Continue to monitor.     Tremor, essential Taking Inderol and valium for this Doing well Managing, no complications at this time. Continue to monitor   Non-seasonal allergic rhinitis, unspecified trigger Continue Singulair Avoid triggers No recent flare. Controlled.  Medication management All medications discussed and reviewed in full. All questions and concerns regarding medications addressed.    [place] is managing; doing well on current meds  History of SCC/BCC/Skin Change  Dr. LLedell Peoplesoffice follows  Refer back if s/s fail to improve Dana [name] Anemia Normocytic Continue to monitor  Right knee pain. Continue to follow with [facility]  Ulnar shaft fracture/multiple falls/deconditioning Well healed - now out cast Using 4 prong walker Refer to PT for evaluation of physical deconditioning and multiple falls  Orders Placed This  Encounter  Procedures   Flu vaccine HIGH DOSE PF   CBC with Differential/Platelet   COMPLETE METABOLIC PANEL WITH GFR   Lipid panel   TSH   Ambulatory referral to Physical Therapy    Referral Priority:   Routine    Referral Type:   Physical Medicine    Referral Reason:   Specialty Services Required    Requested Specialty:   Physical Therapy    Number of Visits Requested:   1   Ambulatory referral to Dermatology    Referral Priority:   Routine    Referral Type:   Consultation    Referral Reason:   Specialty Services Required    Requested Specialty:   Dermatology    Number of Visits Requested:   1   Meds ordered this encounter  Medications   triamcinolone (KENALOG) 0.025 % ointment    Sig: Apply 1 Application topically 2 (two) times daily.    Dispense:  30 g    Refill:  0    Order Specific Question:   Supervising Provider    Answer:   [name][[phone]   Notify office for further evaluation and treatment, questions or concerns if any reported s/s fail to improve.   The patient was advised to call back or seek an in-person evaluation if any symptoms worsen or if the condition fails to improve as anticipated.   Further disposition pending results of labs. Discussed med's effects and SE's.    I discussed the assessment and treatment plan with the patient. The patient was provided an opportunity to ask questions and all were answered. The patient agreed with the plan and demonstrated an understanding of the instructions.  Discussed med's effects and SE's. Screening labs and tests as requested with  regular follow-up as recommended.  I provided 25 minutes of face-to-face time during this encounter including counseling, chart review, and critical decision making was preformed.  Future Appointments  Date Time Provider [place]  11/28/2022  2:30 PM Darrol Jump, NP [facility] None  04/11/2023 11:00 AM Unk Pinto, MD [facility] None  07/13/2023  4:00 PM  Darrol Jump, NP [facility] None     Subjective:  Dana [name] is a 81 y.o. female who presents for 3 month follow up for HTN, HLD, Hypothyroidism, Pre-Diabetes, Vitamin D Defciency, RA, and hereditary essential tremor.  Overall she reports feeling well today.  She has no new concerns.   She lives alone.  Her son and daughter visit occasionally.  08/10/22 saw [facility] for follow up on right knee and Orthovisc injection #1.  She was noted to have a right knee tibial stress fx by MRI 2021 with moderate to advanced patellofemorall JD, left small toe fx 01/2022, left 5th metatarsal fx 2019,  RA on methotrexate and plaquenil 08/17/22 Orthovisc injection to right knee.   She reported a new fall striking left wrist on 08/16/22. No LOC.  Swelling of the wrist was noted.  She had an ulnar shaft fx.  She was placed in a wrist forearm splint and will f/u in 1 week.  She continues to have weakness but overall doing well.  She was hospitalized in Jan 2022 after she fell at home and was found several hours later by family; had rhabdo with CPK ^^ 1716 which has since resolved following IVF; incidentally + covid 19 without clear sx; questionable UTI that was treated. Had R knee contusion with suspected hemarthrosis, was recommended conservative interventions only and to follow up with Dr. Norm Salt. Did complete PT in home for recovery, reports doing well since, continues with home balance exercises. Uses cane and denies falls since. Daughter checks on her regularly.   Patient diagnosed with RA in 2001 and stable on MTX with folica acid and plaquenil and follows with [facility].  She has an essential tremor and taking propanolol for this '80mg'$  TID, also valium PRN. Has not worsened over time.   She is following with Dr. Allyson Sabal for cryo for precancerous lesions. She has a hx of BCC and SCC.  She has noticed an area on her right upper inner thigh that is erythematous, flay and itchy.  She has not applied  any topical for tmt.  She has followed with Dermatology in the past.    BMI is Body mass index is 21.12 kg/m., she has not been working on diet and exercise, she does PT/strengthening exercises regularly since fall but feels as though she is becoming more deconditioned and falling.  Can feel herself becoming more wobbly and dizzy.  She has now switched from a straight cane to a 4 point walker.  Wt Readings from Last 3 Encounters:  10/27/22 116 lb 6.4 oz (52.8 kg)  07/12/22 119 lb (54 kg)  04/05/22 124 lb 6.4 oz (56.4 kg)   She has had elevated blood pressure since 1998. Her blood pressure has been controlled at home, today their BP is BP: 100/60 She does not workout. She denies chest pain, shortness of breath, dizziness.   She has aortic atherosclerosis per CT 08/2020.   She is not on cholesterol medication (she stopped taking pravastatin 40 mg daily 1 year ago) and denies myalgias. Her cholesterol is at goal. The cholesterol last visit was:   Lab Results  Component Value Date  CHOL 167 10/27/2022   HDL 63 10/27/2022   LDLCALC 86 10/27/2022   TRIG 88 10/27/2022   CHOLHDL 2.7 10/27/2022   She has had brief intermittent hx prediabetes since 2012 (5.8% in 2015). She has been working on diet and exercise for glucose management, and denies hyperglycemia, hypoglycemia , increased appetite, nausea, paresthesia of the feet, polydipsia and polyuria. Last A1C in the office was:  Lab Results  Component Value Date   HGBA1C 5.0 04/05/2022   She is on thyroid medication. Last TSH WNL Lab Results  Component Value Date   TSH 3.69 10/27/2022   Last GFR: Lab Results  Component Value Date   GFRNONAA 73 05/06/2021   Patient is on Vitamin D supplement.   Lab Results  Component Value Date   VD25OH 51 04/05/2022     She has chronic normocytic anemia; she is on H65 and folic acid supplements;  Lab Results  Component Value Date   WBC 5.1 10/27/2022   HGB 11.7 10/27/2022   HCT 34.8 (L)  10/27/2022   MCV 96.7 10/27/2022   PLT 264 10/27/2022      Medication Review: Current Outpatient Medications on File Prior to Visit  Medication Sig Dispense Refill   acetaminophen (TYLENOL) 325 MG tablet Take 2 tablets (650 mg total) by mouth every 6 (six) hours as needed. 30 tablet 0   alendronate (FOSAMAX) 70 MG tablet Take 70 mg by mouth once a week. Take with a full glass of water on an empty stomach.     aspirin EC 81 MG tablet Take 81 mg by mouth daily. Swallow whole.     Cholecalciferol (VITAMIN D) 125 MCG (5000 UT) CAPS Take 10,000 Units by mouth every evening.     cimetidine (TAGAMET) 200 MG tablet Take 200 mg by mouth 2 (two) times daily.     Cyanocobalamin (VITAMIN B 12 PO) Place 1,000 mcg under the tongue every evening.     diazepam (VALIUM) 5 MG tablet TAKE 1 TABLET THREE TIMES DAILY  FOR  TREMOR 90 tablet 0   dicyclomine (BENTYL) 20 MG tablet TAKE 1 TABLET THREE TIMES DAILY BEFORE MEALS FOR CRAMPING, BLOATING, NAUSEA OR DIARRHEA 790 tablet 0   folic acid (FOLVITE) 383 MCG tablet Take 1 tablet daily. (Patient taking differently: Take 800 mcg by mouth daily.) 90 tablet 1   furosemide (LASIX) 40 MG tablet TAKE 1 TABLET TWICE DAILY FOR FLUID RETENTION AND ANKLE  SWELLING 180 tablet 10   hydrocortisone 2.5 % ointment Apply topically 2 (two) times daily.     hydroxychloroquine (PLAQUENIL) 200 MG tablet Take 200 mg by mouth See admin instructions. Take one tablet (100 mcg) by mouth daily     levothyroxine (SYNTHROID) 100 MCG tablet Take 1/2 tablet on Mon and Friday and 1 whole tablet  Daily  on an empty stomach with only water for 30 minutes & no Antacid meds, Calcium or Magnesium for 4 hours & avoid Biotin 30 tablet 3   methotrexate (RHEUMATREX) 2.5 MG tablet Take 10 mg by mouth See admin instructions. Take 4 tablets (10 mg) by mouth twice weekly - Tuesday and Wednesday     montelukast (SINGULAIR) 10 MG tablet TAKE 1 TABLET EVERY DAY 90 tablet 3   Multiple Vitamin (MULTIVITAMIN WITH  MINERALS) TABS tablet Take 1 tablet by mouth every evening.     ondansetron (ZOFRAN ODT) 8 MG disintegrating tablet '8mg'$  ODT q4 hours prn nausea (Patient taking differently: Take 8 mg by mouth every 4 (four) hours as  needed for nausea or vomiting.) 30 tablet 3   polyethylene glycol (MIRALAX / GLYCOLAX) 17 g packet Take 17 g by mouth daily. 14 each 0   propranolol (INDERAL) 80 MG tablet TAKE 1 TABLET TWICE DAILY FOR BLOOD PRESSURE AND TREMORS 180 tablet 3   No current facility-administered medications on file prior to visit.    Allergies  Allergen Reactions   Codeine Nausea Only   Mysoline [Primidone] Nausea Only and Other (See Comments)    Over-sedated   Sulfa Antibiotics Itching    Current Problems (verified) Patient Active Problem List   Diagnosis Date Noted   History of basal cell carcinoma (BCC) 05/06/2021   History of SCC (squamous cell carcinoma) of skin 05/06/2021   Right knee pain 12/10/2020   Diverticulosis 09/03/2020   Aortic atherosclerosis ([place]) by CT scan 08/2020 08/17/2020   Osteopenia 12/05/2018   FHx: heart disease 09/03/2018   Former smoker (quit 1985) 09/03/2018   BMI 21.0-21.9, adult 05/30/2018   Generalized OA 09/02/2015   Vitamin D deficiency 01/06/2014   Hypertension    Rheumatoid arthritis ([place])    Hereditary essential tremor    Hypothyroidism    Migraines    Hyperlipidemia, mixed    Chronic anemia     Screening Tests Immunization History  Administered Date(s) Administered   DT (Pediatric) 02/05/2015   Influenza Split 09/12/2013   Influenza, High Dose Seasonal PF 10/28/2014, 08/18/2016, 11/22/2018, 12/09/2019, 10/06/2020, 11/03/2021   Pneumococcal Conjugate-13 05/12/2015   Pneumococcal-Unspecified 11/21/2006   Td 11/22/2003   Tdap 06/26/2021   Varicella 06/23/2011   Zoster, Live 06/23/2011     Names of Other Physician/Practitioners you currently use: 1. [facility] here for primary care 2. Eye Exam, Dr.  Ellie Lunch every six months, has upcoming 06/2021 3. Dentist, several years ago, overdue, reminded to scheduel  Patient Care Team: Unk Pinto, MD as PCP - General (Internal Medicine) Roetta Sessions, MD as Referring Physician (Internal Medicine) Newton Pigg, [facility] (Inactive) as Pharmacist (Pharmacist)  SURGICAL HISTORY She  has a past surgical history that includes Abdominal hysterectomy; Tubal ligation; Cataract extraction, bilateral; Cholecystectomy (08/31/11); and Colonoscopy. FAMILY HISTORY Her family history includes Diabetes in her mother and sister; Heart attack (age of onset: 9) in her son; Heart disease in her father, maternal aunt, maternal uncle, and mother. SOCIAL HISTORY She  reports that she quit smoking about 38 years ago. Her smoking use included cigarettes. She has never used smokeless tobacco. She reports that she does not drink alcohol and does not use drugs.  Review of Systems  Constitutional:  Negative for malaise/fatigue and weight loss.  HENT:  Negative for hearing loss and tinnitus.   Eyes:  Negative for blurred vision and double vision.  Respiratory:  Negative for cough, sputum production, shortness of breath and wheezing.   Cardiovascular:  Negative for chest pain, palpitations, orthopnea, claudication, leg swelling and PND.  Gastrointestinal:  Negative for abdominal pain, blood in stool, constipation, diarrhea, heartburn, melena, nausea and vomiting.  Genitourinary: Negative.   Musculoskeletal:  Negative for falls, joint pain and myalgias.  Skin:  Negative for rash.  Neurological:  Positive for tremors (stable). Negative for dizziness, tingling, sensory change, weakness and headaches.  Endo/Heme/Allergies:  Negative for polydipsia.  Psychiatric/Behavioral: Negative.  Negative for depression, memory loss, substance abuse and suicidal ideas. The patient is not nervous/anxious and does not have insomnia.   All other systems reviewed and are  negative.    Objective:     Today's Vitals  10/27/22 1515  BP: 100/60  Pulse: (!) 46  Temp: (!) 97.3 F (36.3 C)  SpO2: 98%  Weight: 116 lb 6.4 oz (52.8 kg)  Height: 5' 2.25" (1.581 m)   Body mass index is 21.12 kg/m.  General appearance: alert, no distress, WD/WN, female HEENT: normocephalic, sclerae anicteric, TMs pearly, nares patent, no discharge or erythema, pharynx normal Oral cavity: MMM, no lesions Neck: supple, no lymphadenopathy, no thyromegaly, no masses Heart: RRR, normal S1, S2, no murmurs Lungs: CTA bilaterally, no wheezes, rhonchi, or rales Abdomen: +bs, soft, non tender, non distended, no masses, no hepatomegaly, no splenomegaly Musculoskeletal: nontender, no swelling, no obvious deformity Extremities: no edema, no cyanosis, no clubbing Pulses: 2+ symmetric, upper and lower extremities, normal cap refill Neurological: alert, oriented x 3, CN2-12 intact, strength normal upper extremities and lower extremities, sensation normal throughout, DTRs 2+ throughout, no cerebellar signs, gait slow steady, subtle head and vocal tremor Psychiatric: normal affect, behavior normal, pleasant  Skin:  Right upper inner thigh with irregularly shaped mild erythematous area, scaly, flat.  Surrounding skin WNL.  Darrol Jump, NP   11/06/2022

## 2022-10-27 NOTE — Patient Instructions (Signed)

## 2022-10-28 LAB — CBC WITH DIFFERENTIAL/PLATELET
Absolute Monocytes: 525 cells/uL (ref 200–950)
Basophils Absolute: 31 cells/uL (ref 0–200)
Basophils Relative: 0.6 %
Eosinophils Absolute: 71 cells/uL (ref 15–500)
Eosinophils Relative: 1.4 %
HCT: 34.8 % — ABNORMAL LOW (ref 35.0–45.0)
Hemoglobin: 11.7 g/dL (ref 11.7–15.5)
Lymphs Abs: 1775 cells/uL (ref 850–3900)
MCH: 32.5 pg (ref 27.0–33.0)
MCHC: 33.6 g/dL (ref 32.0–36.0)
MCV: 96.7 fL (ref 80.0–100.0)
MPV: 10.2 fL (ref 7.5–12.5)
Monocytes Relative: 10.3 %
Neutro Abs: 2698 cells/uL (ref 1500–7800)
Neutrophils Relative %: 52.9 %
Platelets: 264 10*3/uL (ref 140–400)
RBC: 3.6 10*6/uL — ABNORMAL LOW (ref 3.80–5.10)
RDW: 14 % (ref 11.0–15.0)
Total Lymphocyte: 34.8 %
WBC: 5.1 10*3/uL (ref 3.8–10.8)

## 2022-10-28 LAB — LIPID PANEL
Cholesterol: 167 mg/dL (ref <200)
HDL: 63 mg/dL (ref >=50)
LDL Cholesterol (Calc): 86 mg/dL (calc)
Non-HDL Cholesterol (Calc): 104 mg/dL (calc) (ref <130)
Total CHOL/HDL Ratio: 2.7 (calc) (ref <5.0)
Triglycerides: 88 mg/dL (ref <150)

## 2022-10-28 LAB — COMPLETE METABOLIC PANEL WITH GFR
AG Ratio: 2 (calc) (ref 1.0–2.5)
ALT: 9 U/L (ref 6–29)
AST: 21 U/L (ref 10–35)
Albumin: 3.9 g/dL (ref 3.6–5.1)
Alkaline phosphatase (APISO): 92 U/L (ref 37–153)
BUN: 12 mg/dL (ref 7–25)
CO2: 34 mmol/L — ABNORMAL HIGH (ref 20–32)
Calcium: 9 mg/dL (ref 8.6–10.4)
Chloride: 102 mmol/L (ref 98–110)
Creat: 0.75 mg/dL (ref 0.60–0.95)
Globulin: 2 g/dL (calc) (ref 1.9–3.7)
Glucose, Bld: 86 mg/dL (ref 65–99)
Potassium: 3.9 mmol/L (ref 3.5–5.3)
Sodium: 142 mmol/L (ref 135–146)
Total Bilirubin: 0.5 mg/dL (ref 0.2–1.2)
Total Protein: 5.9 g/dL — ABNORMAL LOW (ref 6.1–8.1)
eGFR: 79 mL/min/{1.73_m2} (ref >=60)

## 2022-10-28 LAB — TSH: TSH: 3.69 mIU/L (ref 0.40–4.50)

## 2022-11-02 ENCOUNTER — Telehealth: Payer: Self-pay | Admitting: Nurse Practitioner

## 2022-11-02 ENCOUNTER — Encounter: Payer: Self-pay | Admitting: Internal Medicine

## 2022-11-02 NOTE — Telephone Encounter (Signed)
-----   Message from Chancy Hurter, [place] sent at 11/01/2022  4:41 PM EST -----  ----- Message ----- From: Darrol Jump, NP Sent: 11/01/2022   3:44 PM EST To: Chancy Hurter, CMA  Please reach out to patient and advise her that the PT, Clair Gulling, whom she worked with before and requested to be seen by this time, is with [facility].  At this time, she would not qualify for [facility] so we have to place a referral for outpatient PT.  This means that Clair Gulling will not be working with her but someone else.

## 2022-11-02 NOTE — Telephone Encounter (Signed)
Lvm for patient to call regarding physical therapy referral

## 2022-11-23 ENCOUNTER — Telehealth: Payer: Self-pay | Admitting: Nurse Practitioner

## 2022-11-23 DIAGNOSIS — J3089 Other allergic rhinitis: Secondary | ICD-10-CM

## 2022-11-23 MED ORDER — MONTELUKAST SODIUM 10 MG PO TABS: ORAL_TABLET | ORAL | 3 refills | Status: DC

## 2022-11-23 NOTE — Addendum Note (Signed)
Addended by: Chancy Hurter on: 11/23/2022 12:08 PM   Modules accepted: Orders

## 2022-11-23 NOTE — Telephone Encounter (Signed)
Pt is requesting refill on Montelukast to go to [facility] on [address]

## 2022-11-25 DIAGNOSIS — S52232A Displaced oblique fracture of shaft of left ulna, initial encounter for closed fracture: Secondary | ICD-10-CM | POA: Diagnosis not present

## 2022-11-28 ENCOUNTER — Ambulatory Visit (INDEPENDENT_AMBULATORY_CARE_PROVIDER_SITE_OTHER): Payer: Medicare Other | Admitting: Nurse Practitioner

## 2022-11-28 VITALS — BP 110/58 | HR 57 | Temp 97.9°F | Ht 62.25 in | Wt 120.4 lb

## 2022-11-28 DIAGNOSIS — S52209D Unspecified fracture of shaft of unspecified ulna, subsequent encounter for closed fracture with routine healing: Secondary | ICD-10-CM

## 2022-11-28 DIAGNOSIS — E032 Hypothyroidism due to medicaments and other exogenous substances: Secondary | ICD-10-CM | POA: Diagnosis not present

## 2022-11-28 DIAGNOSIS — Z79899 Other long term (current) drug therapy: Secondary | ICD-10-CM

## 2022-11-28 DIAGNOSIS — E782 Mixed hyperlipidemia: Secondary | ICD-10-CM | POA: Diagnosis not present

## 2022-11-28 DIAGNOSIS — M069 Rheumatoid arthritis, unspecified: Secondary | ICD-10-CM | POA: Diagnosis not present

## 2022-11-28 DIAGNOSIS — G250 Essential tremor: Secondary | ICD-10-CM | POA: Diagnosis not present

## 2022-11-28 DIAGNOSIS — H6122 Impacted cerumen, left ear: Secondary | ICD-10-CM

## 2022-11-28 DIAGNOSIS — D649 Anemia, unspecified: Secondary | ICD-10-CM | POA: Diagnosis not present

## 2022-11-28 DIAGNOSIS — I10 Essential (primary) hypertension: Secondary | ICD-10-CM

## 2022-11-28 DIAGNOSIS — J3089 Other allergic rhinitis: Secondary | ICD-10-CM

## 2022-11-28 DIAGNOSIS — K591 Functional diarrhea: Secondary | ICD-10-CM | POA: Diagnosis not present

## 2022-11-28 DIAGNOSIS — Z85828 Personal history of other malignant neoplasm of skin: Secondary | ICD-10-CM

## 2022-11-28 DIAGNOSIS — M25561 Pain in right knee: Secondary | ICD-10-CM | POA: Diagnosis not present

## 2022-11-28 DIAGNOSIS — G8929 Other chronic pain: Secondary | ICD-10-CM

## 2022-11-28 NOTE — Patient Instructions (Signed)

## 2022-11-28 NOTE — Progress Notes (Signed)
FOLLOW UP  Assessment:   Dana [name] was seen today for follow-up   Diagnoses and all orders for this visit:  Essential hypertension Discussed DASH (Dietary Approaches to Stop Hypertension) DASH diet is lower in sodium than a typical American diet. Cut back on foods that are high in saturated fat, cholesterol, and trans fats. Eat more whole-grain foods, fish, poultry, and nuts Remain active and exercise as tolerated daily.  Monitor BP at home-Call if greater than 130/80.  Check CMP/CBC   Hyperlipidemia, mixed Discussed lifestyle modifications. Recommended diet heavy in fruits and veggies, omega 3's. Decrease consumption of animal meats, cheeses, and dairy products. Remain active and exercise as tolerated. Continue to monitor. Check lipids/TSH  Hypothyroidism, unspecified type Controlled. Continue Levothyroxine. Reminded to take on an empty stomach 30-56mns before food.  Stop any Biotin Supplement 48-72 hours before next TSH level to reduce the risk of falsely low TSH levels. Continue to monitor.     Tremor, essential Controlled Continue Propranalol and valium  Continue to monitor   Non-seasonal allergic rhinitis, unspecified trigger Continue Singulair Avoid triggers No recent flare. Controlled.  Medication management All medications discussed and reviewed in full. All questions and concerns regarding medications addressed.    Rheumatoid Arthritis ([facility] [place] rheum is managing; doing well on current meds  History of SCC/BCC/Skin Change  Dr. LLedell Peoplesoffice follows  Refer back if s/s fail to improve Dana [name] Anemia Normocytic Continue to monitor  Right knee pain. Continue to follow with Dana [name]  Ulnar shaft fracture/multiple falls/deconditioning Well healed - now out cast Using 4 prong walker PT on hold for now - will continue to reach out.   Diarrhea Take 1/2 Bentyl daily Take Miralax QOD    Left ear cerumen impaction Left  ear(s) irrigated with lukewarm water, hydrogen peroxide. Large piece of brown cerumen x2 extracted with ear curette. TM:  WNL.  Pt tolerated procedure well.     No orders of the defined types were placed in this encounter.  Notify office for further evaluation and treatment, questions or concerns if any reported s/s fail to improve.   The patient was advised to call back or seek an in-person evaluation if any symptoms worsen or if the condition fails to improve as anticipated.   Further disposition pending results of labs. Discussed med's effects and SE's.    I discussed the assessment and treatment plan with the patient. The patient was provided an opportunity to ask questions and all were answered. The patient agreed with the plan and demonstrated an understanding of the instructions.  Discussed med's effects and SE's. Screening labs and tests as requested with regular follow-up as recommended.  I provided 25 minutes of face-to-face time during this encounter including counseling, chart review, and critical decision making was preformed.  Future Appointments  Date Time Provider [name] 04/11/2023 11:00 AM Dana Pinto MD [facility] None  07/13/2023  4:00 PM Dana Jump NP [facility] None     Subjective:  MRosary [name] a 82y.o. female who presents for 3 month follow up for HTN, HLD, Hypothyroidism, Pre-Diabetes, Vitamin Dana Defciency, RA, and hereditary essential tremor.  Overall she reports feeling well today.    She lives alone.  Her son and daughter visit occasionally.  She has noticed left ear has decreased hearing over the last month.  She has not tried to clean out her ears.  Does not use Q-tips.    08/10/22 saw [place] [name] for follow up on right  knee and Orthovisc injection #1.  She was noted to have a right knee tibial stress fx by MRI 2021 with moderate to advanced patellofemorall JD, left small toe fx 01/2022, left 5th metatarsal fx 2019,  RA  on methotrexate and plaquenil 08/17/22 Orthovisc injection to right knee.   She reported a new fall striking left wrist on 08/16/22. No LOC.  Swelling of the wrist was noted.  She had an ulnar shaft fx.  She was placed in a wrist forearm splint and will f/u in 1 week.  She continues to have weakness but overall doing well.  She was hospitalized in Jan 2022 after she fell at home and was found several hours later by family; had rhabdo with CPK ^^ 1716 which has since resolved following IVF; incidentally + covid 19 without clear sx; questionable UTI that was treated. Had R knee contusion with suspected hemarthrosis, was recommended conservative interventions only and to follow up with Dr. Norm Salt. Did complete PT in home for recovery, reports doing well since, continues with home balance exercises. Uses cane and denies falls since. Daughter checks on her regularly.   She was referred back to PT last OV but has not committed Dana/t feeling as though her bowels are not well controlled with diarrhea episodes.  Feels as though when she takes the Bentyl, she can only take for 4 days before she feels constipated.  She does take Miralax daily and feels as though this causes diarrhea.     Patient diagnosed with RA in 2001 and stable on MTX with folica acid and plaquenil and follows with [facility].  She has an essential tremor and taking propanolol for this '80mg'$  TID, also valium PRN. Has not worsened over time.   She is following with Dr. Allyson Sabal for cryo for precancerous lesions. She has a hx of BCC and SCC.  She has noticed an area on her right upper inner thigh that is erythematous, flay and itchy.  She has not applied any topical for tmt.  She has followed with Dermatology in the past.    BMI is Body mass index is 21.84 kg/m., she has not been working on diet and exercise Wt Readings from Last 3 Encounters:  11/28/22 120 lb 6.4 oz (54.6 kg)  10/27/22 116 lb 6.4 oz (52.8 kg)  07/12/22 119 lb (54 kg)   She has  had elevated blood pressure since 1998 most recently below goal. Her blood pressure has been controlled at home, today their BP is BP: (!) 110/58 She does not workout. She denies chest pain, shortness of breath, reports occasional dizziness.   She has aortic atherosclerosis per CT 08/2020.   She is not on cholesterol medication (she stopped taking pravastatin 40 mg daily 1 year ago) and denies myalgias. Her cholesterol is at goal. The cholesterol last visit was:   Lab Results  Component Value Date   CHOL 167 10/27/2022   HDL 63 10/27/2022   LDLCALC 86 10/27/2022   TRIG 88 10/27/2022   CHOLHDL 2.7 10/27/2022   She has had brief intermittent hx prediabetes since 2012 (5.8% in 2015). She has been working on diet and exercise for glucose management, and denies hyperglycemia, hypoglycemia , increased appetite, nausea, paresthesia of the feet, polydipsia and polyuria. Last A1C in the office was:  Lab Results  Component Value Date   HGBA1C 5.0 04/05/2022   She is on thyroid medication. Last TSH WNL Lab Results  Component Value Date   TSH 3.69 10/27/2022   Last  GFR: Lab Results  Component Value Date   GFRNONAA 73 05/06/2021   Patient is on Vitamin Dana supplement.   Lab Results  Component Value Date   VD25OH 51 04/05/2022     She has chronic normocytic anemia; she is on J69 and folic acid supplements;  Lab Results  Component Value Date   WBC 5.1 10/27/2022   HGB 11.7 10/27/2022   HCT 34.8 (L) 10/27/2022   MCV 96.7 10/27/2022   PLT 264 10/27/2022      Medication Review: Current Outpatient Medications on File Prior to Visit  Medication Sig Dispense Refill   acetaminophen (TYLENOL) 325 MG tablet Take 2 tablets (650 mg total) by mouth every 6 (six) hours as needed. 30 tablet 0   alendronate (FOSAMAX) 70 MG tablet Take 70 mg by mouth once a week. Take with a full glass of water on an empty stomach.     aspirin EC 81 MG tablet Take 81 mg by mouth daily. Swallow whole.      Cholecalciferol (VITAMIN Dana) 125 MCG (5000 UT) CAPS Take 10,000 Units by mouth every evening.     cimetidine (TAGAMET) 200 MG tablet Take 200 mg by mouth 2 (two) times daily.     Cyanocobalamin (VITAMIN B 12 PO) Place 1,000 mcg under the tongue every evening.     diazepam (VALIUM) 5 MG tablet TAKE 1 TABLET THREE TIMES DAILY  FOR  TREMOR 90 tablet 0   dicyclomine (BENTYL) 20 MG tablet TAKE 1 TABLET THREE TIMES DAILY BEFORE MEALS FOR CRAMPING, BLOATING, NAUSEA OR DIARRHEA 678 tablet 0   folic acid (FOLVITE) 938 MCG tablet Take 1 tablet daily. (Patient taking differently: Take 800 mcg by mouth daily.) 90 tablet 1   furosemide (LASIX) 40 MG tablet TAKE 1 TABLET TWICE DAILY FOR FLUID RETENTION AND ANKLE  SWELLING 180 tablet 10   hydrocortisone 2.5 % ointment Apply topically 2 (two) times daily.     hydroxychloroquine (PLAQUENIL) 200 MG tablet Take 200 mg by mouth See admin instructions. Take one tablet (100 mcg) by mouth daily     levothyroxine (SYNTHROID) 100 MCG tablet Take 1/2 tablet on Mon and Friday and 1 whole tablet  Daily  on an empty stomach with only water for 30 minutes & no Antacid meds, Calcium or Magnesium for 4 hours & avoid Biotin 30 tablet 3   methotrexate (RHEUMATREX) 2.5 MG tablet Take 10 mg by mouth See admin instructions. Take 4 tablets (10 mg) by mouth twice weekly - Tuesday and Wednesday     montelukast (SINGULAIR) 10 MG tablet TAKE 1 TABLET EVERY DAY 90 tablet 3   Multiple Vitamin (MULTIVITAMIN WITH MINERALS) TABS tablet Take 1 tablet by mouth every evening.     ondansetron (ZOFRAN ODT) 8 MG disintegrating tablet '8mg'$  ODT q4 hours prn nausea (Patient taking differently: Take 8 mg by mouth every 4 (four) hours as needed for nausea or vomiting.) 30 tablet 3   polyethylene glycol (MIRALAX / GLYCOLAX) 17 g packet Take 17 g by mouth daily. 14 each 0   propranolol (INDERAL) 80 MG tablet TAKE 1 TABLET TWICE DAILY FOR BLOOD PRESSURE AND TREMORS 180 tablet 3   triamcinolone (KENALOG) 0.025 %  ointment Apply 1 Application topically 2 (two) times daily. 30 g 0   No current facility-administered medications on file prior to visit.    Allergies  Allergen Reactions   Codeine Nausea Only   Mysoline [Primidone] Nausea Only and Other (See Comments)    Over-sedated   Sulfa  Antibiotics Itching    Current Problems (verified) Patient Active Problem List   Diagnosis Date Noted   History of basal cell carcinoma (BCC) 05/06/2021   History of SCC (squamous cell carcinoma) of skin 05/06/2021   Right knee pain 12/10/2020   Diverticulosis 09/03/2020   Aortic atherosclerosis ([place]) by CT scan 08/2020 08/17/2020   Osteopenia 12/05/2018   FHx: heart disease 09/03/2018   Former smoker (quit 1985) 09/03/2018   BMI 21.0-21.9, adult 05/30/2018   Generalized OA 09/02/2015   Vitamin Dana deficiency 01/06/2014   Hypertension    Rheumatoid arthritis ([place])    Hereditary essential tremor    Hypothyroidism    Migraines    Hyperlipidemia, mixed    Chronic anemia     Screening Tests Immunization History  Administered Date(s) Administered   DT (Pediatric) 02/05/2015   Influenza Split 09/12/2013   Influenza, High Dose Seasonal PF 10/28/2014, 08/18/2016, 11/22/2018, 12/09/2019, 10/06/2020, 11/03/2021, 10/27/2022   Pneumococcal Conjugate-13 05/12/2015   Pneumococcal-Unspecified 11/21/2006   Td 11/22/2003   Tdap 06/26/2021   Varicella 06/23/2011   Zoster, Live 06/23/2011     Names of Other Physician/Practitioners you currently use: 1. [facility] here for primary care 2. Eye Exam, Dr. Ellie Lunch every six months, has upcoming 06/2021 3. Dentist, several years ago, overdue, reminded to scheduel  Patient Care Team: Unk Pinto, MD as PCP - General (Internal Medicine) Roetta Sessions, MD as Referring Physician (Internal Medicine) Newton Pigg, [facility] (Inactive) as Pharmacist (Pharmacist)  SURGICAL HISTORY She  has a past surgical history that  includes Abdominal hysterectomy; Tubal ligation; Cataract extraction, bilateral; Cholecystectomy (08/31/11); and Colonoscopy. FAMILY HISTORY Her family history includes Diabetes in her mother and sister; Heart attack (age of onset: 69) in her son; Heart disease in her father, maternal aunt, maternal uncle, and mother. SOCIAL HISTORY She  reports that she quit smoking about 38 years ago. Her smoking use included cigarettes. She has never used smokeless tobacco. She reports that she does not drink alcohol and does not use drugs.  Review of Systems  Constitutional:  Negative for malaise/fatigue and weight loss.  HENT:  Negative for hearing loss and tinnitus.   Eyes:  Negative for blurred vision and double vision.  Respiratory:  Negative for cough, sputum production, shortness of breath and wheezing.   Cardiovascular:  Negative for chest pain, palpitations, orthopnea, claudication, leg swelling and PND.  Gastrointestinal:  Negative for abdominal pain, blood in stool, constipation, diarrhea, heartburn, melena, nausea and vomiting.  Genitourinary: Negative.   Musculoskeletal:  Negative for falls, joint pain and myalgias.  Skin:  Negative for rash.  Neurological:  Positive for tremors (stable). Negative for dizziness, tingling, sensory change, weakness and headaches.  Endo/Heme/Allergies:  Negative for polydipsia.  Psychiatric/Behavioral: Negative.  Negative for depression, memory loss, substance abuse and suicidal ideas. The patient is not nervous/anxious and does not have insomnia.   All other systems reviewed and are negative.    Objective:     Today's Vitals   11/28/22 1435  BP: (!) 110/58  Pulse: (!) 57  Temp: 97.9 F (36.6 C)  SpO2: 99%  Weight: 120 lb 6.4 oz (54.6 kg)  Height: 5' 2.25" (1.581 m)   Body mass index is 21.84 kg/m.  General appearance: alert, no distress, WD/WN, female HEENT: normocephalic, sclerae anicteric, TMs pearly, nares patent, no discharge or erythema,  pharynx normal Oral cavity: MMM, no lesions Neck: supple, no lymphadenopathy, no thyromegaly, no masses Heart: RRR, normal S1, S2, no murmurs Lungs: CTA  bilaterally, no wheezes, rhonchi, or rales Abdomen: +bs, soft, non tender, non distended, no masses, no hepatomegaly, no splenomegaly Musculoskeletal: nontender, no swelling, no obvious deformity Extremities: no edema, no cyanosis, no clubbing Pulses: 2+ symmetric, upper and lower extremities, normal cap refill Neurological: alert, oriented x 3, CN2-12 intact, strength normal upper extremities and lower extremities, sensation normal throughout, DTRs 2+ throughout, no cerebellar signs, gait slow steady, subtle head and vocal tremor Psychiatric: normal affect, behavior normal, pleasant    Dana Mancillas, NP   11/28/2022

## 2022-12-21 ENCOUNTER — Telehealth: Payer: Self-pay | Admitting: Nurse Practitioner

## 2022-12-21 NOTE — Telephone Encounter (Signed)
Pt is requesting a refill on Diazepam to go to [facility] on file

## 2022-12-26 DIAGNOSIS — S52232A Displaced oblique fracture of shaft of left ulna, initial encounter for closed fracture: Secondary | ICD-10-CM | POA: Diagnosis not present

## 2023-01-02 ENCOUNTER — Other Ambulatory Visit: Payer: Self-pay | Admitting: Nurse Practitioner

## 2023-01-02 DIAGNOSIS — G250 Essential tremor: Secondary | ICD-10-CM

## 2023-01-02 MED ORDER — DIAZEPAM 5 MG PO TABS: ORAL_TABLET | ORAL | 0 refills | Status: DC

## 2023-01-02 NOTE — Telephone Encounter (Signed)
Pt following up on diazepam request

## 2023-01-04 ENCOUNTER — Encounter: Payer: Self-pay | Admitting: Internal Medicine

## 2023-01-04 ENCOUNTER — Ambulatory Visit (INDEPENDENT_AMBULATORY_CARE_PROVIDER_SITE_OTHER): Payer: Medicare Other | Admitting: Internal Medicine

## 2023-01-04 VITALS — BP 122/58 | HR 63 | Temp 97.9°F | Resp 17 | Ht 62.25 in | Wt 120.8 lb

## 2023-01-04 DIAGNOSIS — B351 Tinea unguium: Secondary | ICD-10-CM

## 2023-01-04 MED ORDER — TERBINAFINE HCL 250 MG PO TABS: ORAL_TABLET | ORAL | 0 refills | Status: DC

## 2023-01-04 NOTE — Patient Instructions (Signed)

## 2023-01-04 NOTE — Progress Notes (Signed)
Future Appointments  Date Time Provider Department  04/11/2023                  cpe 11:00 AM Unk Pinto, MD [facility]  07/13/2023                 wellness  4:00 PM Darrol Jump, NP [facility]    History of Present Illness:                                         This very nice 82 y.o.  [place] with HTN, HLD, Prediabetes, Hypothyroid, Rheumatoid Arthritis and Vitamin D Deficiency presents with concerns of thickened,chalky, dystrophic toenails .    Medication Sig   acetaminophen 325 MG tablet Take 2 tablets every 6  hours as needed.   alendronate 70 MG tablet Take 70 mg  once a week.    aspirin EC 81 MG tablet Take  daily.   VITAMIN D 5000  u  Take 10,000 Units every evening.   VITAMIN B 12    1,000 mcg  Place under the tongue every evening.   diazepam 5 MG tablet TAKE 1 TABLET THREE TIMES DAILY     dicyclomine  20 MG tablet    folic acid (FOLVITE) [id number] MCG tablet Take 1 tablet daily   furosemide 40 MG tablet TAKE 1 TABLET TWICE DAILY    hydrocortisone 2.5 % ointment Apply topically 2  times daily.   hydroxychloroquine (PLAQUENIL) 200 MG  Take one tablet (100 mcg) by mouth daily   Levothyroxine 100 MCG tablet Take 1/2 ta on Mon Friday and 1 whole tablet  Daily    methotrexate (RHEUMATREX) 2.5 MG tablet Take 4 tablets  twice weekly - Tuesday and Wednesday   montelukast 10 MG tablet TAKE 1 TABLET EVERY DAY   Multiple Vitamin  Take 1 tablet every evening.   ondansetron ODT) 8 MG  35m ODT q4 hours prn nausea    polyethylene glycol 17 g packet Take 17 g daily.   propranolol (INDERAL) 80 MG tablet TAKE 1 TABLET TWICE DAILY   triamcinolone 0.025 % ointment Apply 1 Application topically 2 (two) times daily.     Allergies  Allergen Reactions   Codeine Nausea Only   Mysoline [Primidone] Nausea Only and Other (See Comments)    Over-sedated   Sulfa Antibiotics Itching     Problem list She has Rheumatoid arthritis ([place]; Hereditary essential tremor; Hypothyroidism; Migraines;  Hyperlipidemia, mixed; Chronic anemia; Hypertension; Vitamin D deficiency; Generalized OA; BMI 21.0-21.9, adult; FHx: heart disease; Former smoker (quit 1985); Osteopenia; [place] atherosclerosis [place] 08[place]; Diverticulosis; Right knee pain; History of basal cell carcinoma (BCC); and History of SCC (squamous cell carcinoma) of skin on their problem list.   Observations/Objective:  BP (!) 122/58   Pulse 63   Temp 97.9 F (36.6 C)   Resp 17   Ht 5' 2.25" (1.581 m)   Wt 120 lb 12.8 oz (54.8 kg)   SpO2 99%   BMI 21.92 kg/m   Exam focused on toenails finds # 10 thickened chalky, dystrophic toenails   Assessment and Plan:   1. Onychomycosis  - terbinafine (LAMISIL) 250 MG tablet;  Take 1 tablet Daily for Toenail Fungus   Dispense: 90 tablet; Refill: 0  - Discussed meds & side-effects   Follow Up Instructions:        I  discussed the assessment and treatment plan with the patient. The patient was provided an opportunity to ask questions and all were answered. The patient agreed with the plan and demonstrated an understanding of the instructions.       The patient was advised to call back or seek an in-person evaluation if the symptoms worsen or if the condition fails to improve as anticipated.    Kirtland Bouchard, MD

## 2023-01-08 ENCOUNTER — Emergency Department (HOSPITAL_COMMUNITY): Payer: Medicare Other

## 2023-01-08 ENCOUNTER — Other Ambulatory Visit: Payer: Self-pay

## 2023-01-08 ENCOUNTER — Inpatient Hospital Stay (HOSPITAL_COMMUNITY)
Admission: EM | Admit: 2023-01-08 | Discharge: 2023-01-13 | DRG: 481 | Disposition: A | Discharge status: Home / Self Care | Payer: Medicare Other | Attending: Internal Medicine | Admitting: Internal Medicine

## 2023-01-08 ENCOUNTER — Encounter (HOSPITAL_COMMUNITY): Payer: Self-pay | Admitting: Emergency Medicine

## 2023-01-08 DIAGNOSIS — E032 Hypothyroidism due to medicaments and other exogenous substances: Secondary | ICD-10-CM | POA: Diagnosis not present

## 2023-01-08 DIAGNOSIS — G250 Essential tremor: Secondary | ICD-10-CM | POA: Diagnosis not present

## 2023-01-08 DIAGNOSIS — E785 Hyperlipidemia, unspecified: Secondary | ICD-10-CM | POA: Diagnosis present

## 2023-01-08 DIAGNOSIS — Z7983 Long term (current) use of bisphosphonates: Secondary | ICD-10-CM | POA: Diagnosis not present

## 2023-01-08 DIAGNOSIS — M7989 Other specified soft tissue disorders: Secondary | ICD-10-CM | POA: Diagnosis not present

## 2023-01-08 DIAGNOSIS — Z888 Allergy status to other drugs, medicaments and biological substances status: Secondary | ICD-10-CM

## 2023-01-08 DIAGNOSIS — Z9889 Other specified postprocedural states: Secondary | ICD-10-CM | POA: Diagnosis not present

## 2023-01-08 DIAGNOSIS — Z79899 Other long term (current) drug therapy: Secondary | ICD-10-CM | POA: Diagnosis not present

## 2023-01-08 DIAGNOSIS — S72402D Unspecified fracture of lower end of left femur, subsequent encounter for closed fracture with routine healing: Secondary | ICD-10-CM | POA: Diagnosis not present

## 2023-01-08 DIAGNOSIS — Z4789 Encounter for other orthopedic aftercare: Secondary | ICD-10-CM | POA: Diagnosis not present

## 2023-01-08 DIAGNOSIS — S199XXA Unspecified injury of neck, initial encounter: Secondary | ICD-10-CM | POA: Diagnosis not present

## 2023-01-08 DIAGNOSIS — Z833 Family history of diabetes mellitus: Secondary | ICD-10-CM | POA: Diagnosis not present

## 2023-01-08 DIAGNOSIS — W07XXXA Fall from chair, initial encounter: Secondary | ICD-10-CM | POA: Diagnosis present

## 2023-01-08 DIAGNOSIS — Z882 Allergy status to sulfonamides status: Secondary | ICD-10-CM

## 2023-01-08 DIAGNOSIS — S72463A Displaced supracondylar fracture with intracondylar extension of lower end of unspecified femur, initial encounter for closed fracture: Secondary | ICD-10-CM | POA: Insufficient documentation

## 2023-01-08 DIAGNOSIS — Z87891 Personal history of nicotine dependence: Secondary | ICD-10-CM | POA: Diagnosis not present

## 2023-01-08 DIAGNOSIS — M80062A Age-related osteoporosis with current pathological fracture, left lower leg, initial encounter for fracture: Secondary | ICD-10-CM | POA: Diagnosis present

## 2023-01-08 DIAGNOSIS — M069 Rheumatoid arthritis, unspecified: Secondary | ICD-10-CM | POA: Diagnosis present

## 2023-01-08 DIAGNOSIS — I10 Essential (primary) hypertension: Secondary | ICD-10-CM | POA: Diagnosis present

## 2023-01-08 DIAGNOSIS — S82202A Unspecified fracture of shaft of left tibia, initial encounter for closed fracture: Secondary | ICD-10-CM | POA: Diagnosis present

## 2023-01-08 DIAGNOSIS — I2489 Other forms of acute ischemic heart disease: Secondary | ICD-10-CM | POA: Diagnosis not present

## 2023-01-08 DIAGNOSIS — Z602 Problems related to living alone: Secondary | ICD-10-CM | POA: Diagnosis present

## 2023-01-08 DIAGNOSIS — S82302A Unspecified fracture of lower end of left tibia, initial encounter for closed fracture: Secondary | ICD-10-CM

## 2023-01-08 DIAGNOSIS — Z9049 Acquired absence of other specified parts of digestive tract: Secondary | ICD-10-CM | POA: Diagnosis not present

## 2023-01-08 DIAGNOSIS — R7989 Other specified abnormal findings of blood chemistry: Secondary | ICD-10-CM | POA: Diagnosis present

## 2023-01-08 DIAGNOSIS — Z7982 Long term (current) use of aspirin: Secondary | ICD-10-CM

## 2023-01-08 DIAGNOSIS — Z885 Allergy status to narcotic agent status: Secondary | ICD-10-CM | POA: Diagnosis not present

## 2023-01-08 DIAGNOSIS — Z85828 Personal history of other malignant neoplasm of skin: Secondary | ICD-10-CM

## 2023-01-08 DIAGNOSIS — M8580 Other specified disorders of bone density and structure, unspecified site: Secondary | ICD-10-CM | POA: Diagnosis present

## 2023-01-08 DIAGNOSIS — R7303 Prediabetes: Secondary | ICD-10-CM | POA: Diagnosis not present

## 2023-01-08 DIAGNOSIS — M25562 Pain in left knee: Secondary | ICD-10-CM | POA: Diagnosis not present

## 2023-01-08 DIAGNOSIS — S728X2A Other fracture of left femur, initial encounter for closed fracture: Secondary | ICD-10-CM | POA: Diagnosis not present

## 2023-01-08 DIAGNOSIS — W19XXXA Unspecified fall, initial encounter: Principal | ICD-10-CM

## 2023-01-08 DIAGNOSIS — Z8249 Family history of ischemic heart disease and other diseases of the circulatory system: Secondary | ICD-10-CM

## 2023-01-08 DIAGNOSIS — R269 Unspecified abnormalities of gait and mobility: Secondary | ICD-10-CM | POA: Diagnosis not present

## 2023-01-08 DIAGNOSIS — S72462A Displaced supracondylar fracture with intracondylar extension of lower end of left femur, initial encounter for closed fracture: Secondary | ICD-10-CM | POA: Diagnosis not present

## 2023-01-08 DIAGNOSIS — R609 Edema, unspecified: Secondary | ICD-10-CM | POA: Diagnosis not present

## 2023-01-08 DIAGNOSIS — Z743 Need for continuous supervision: Secondary | ICD-10-CM | POA: Diagnosis not present

## 2023-01-08 DIAGNOSIS — Y92019 Unspecified place in single-family (private) house as the place of occurrence of the external cause: Secondary | ICD-10-CM

## 2023-01-08 DIAGNOSIS — S72492D Other fracture of lower end of left femur, subsequent encounter for closed fracture with routine healing: Secondary | ICD-10-CM | POA: Diagnosis not present

## 2023-01-08 DIAGNOSIS — Z79631 Long term (current) use of antimetabolite agent: Secondary | ICD-10-CM

## 2023-01-08 DIAGNOSIS — M80052A Age-related osteoporosis with current pathological fracture, left femur, initial encounter for fracture: Principal | ICD-10-CM | POA: Diagnosis present

## 2023-01-08 DIAGNOSIS — S72452A Displaced supracondylar fracture without intracondylar extension of lower end of left femur, initial encounter for closed fracture: Secondary | ICD-10-CM | POA: Diagnosis not present

## 2023-01-08 DIAGNOSIS — E039 Hypothyroidism, unspecified: Secondary | ICD-10-CM | POA: Diagnosis present

## 2023-01-08 DIAGNOSIS — I700 Atherosclerosis of aorta: Secondary | ICD-10-CM | POA: Diagnosis present

## 2023-01-08 DIAGNOSIS — Z9181 History of falling: Secondary | ICD-10-CM | POA: Diagnosis not present

## 2023-01-08 DIAGNOSIS — Z79890 Hormone replacement therapy (postmenopausal): Secondary | ICD-10-CM | POA: Diagnosis not present

## 2023-01-08 DIAGNOSIS — M6281 Muscle weakness (generalized): Secondary | ICD-10-CM | POA: Diagnosis not present

## 2023-01-08 DIAGNOSIS — S80919A Unspecified superficial injury of unspecified knee, initial encounter: Secondary | ICD-10-CM | POA: Diagnosis not present

## 2023-01-08 DIAGNOSIS — E538 Deficiency of other specified B group vitamins: Secondary | ICD-10-CM | POA: Diagnosis not present

## 2023-01-08 DIAGNOSIS — R1310 Dysphagia, unspecified: Secondary | ICD-10-CM | POA: Diagnosis not present

## 2023-01-08 DIAGNOSIS — D62 Acute posthemorrhagic anemia: Secondary | ICD-10-CM | POA: Diagnosis not present

## 2023-01-08 DIAGNOSIS — R531 Weakness: Secondary | ICD-10-CM | POA: Diagnosis not present

## 2023-01-08 DIAGNOSIS — D649 Anemia, unspecified: Secondary | ICD-10-CM | POA: Diagnosis not present

## 2023-01-08 DIAGNOSIS — Z7401 Bed confinement status: Secondary | ICD-10-CM | POA: Diagnosis not present

## 2023-01-08 DIAGNOSIS — S0990XA Unspecified injury of head, initial encounter: Secondary | ICD-10-CM | POA: Diagnosis not present

## 2023-01-08 DIAGNOSIS — D638 Anemia in other chronic diseases classified elsewhere: Secondary | ICD-10-CM | POA: Diagnosis not present

## 2023-01-08 DIAGNOSIS — R6889 Other general symptoms and signs: Secondary | ICD-10-CM | POA: Diagnosis not present

## 2023-01-08 DIAGNOSIS — Z01818 Encounter for other preprocedural examination: Secondary | ICD-10-CM | POA: Diagnosis not present

## 2023-01-08 LAB — BASIC METABOLIC PANEL
Anion gap: 10 (ref 5–15)
BUN: 12 mg/dL (ref 8–23)
CO2: 28 mmol/L (ref 22–32)
Calcium: 8.4 mg/dL — ABNORMAL LOW (ref 8.9–10.3)
Chloride: 103 mmol/L (ref 98–111)
Creatinine, Ser: 0.67 mg/dL (ref 0.44–1.00)
GFR, Estimated: >60 mL/min (ref >60)
Glucose, Bld: 96 mg/dL (ref 70–99)
Potassium: 3.9 mmol/L (ref 3.5–5.1)
Sodium: 141 mmol/L (ref 135–145)

## 2023-01-08 LAB — CBC WITH DIFFERENTIAL/PLATELET
Abs Immature Granulocytes: 0.07 10*3/uL (ref 0.00–0.07)
Basophils Absolute: 0 10*3/uL (ref 0.0–0.1)
Basophils Relative: 0 %
Eosinophils Absolute: 0 10*3/uL (ref 0.0–0.5)
Eosinophils Relative: 0 %
HCT: 34.3 % — ABNORMAL LOW (ref 36.0–46.0)
Hemoglobin: 10.9 g/dL — ABNORMAL LOW (ref 12.0–15.0)
Immature Granulocytes: 1 %
Lymphocytes Relative: 8 %
Lymphs Abs: 0.9 10*3/uL (ref 0.7–4.0)
MCH: 32.2 pg (ref 26.0–34.0)
MCHC: 31.8 g/dL (ref 30.0–36.0)
MCV: 101.2 fL — ABNORMAL HIGH (ref 80.0–100.0)
Monocytes Absolute: 0.4 10*3/uL (ref 0.1–1.0)
Monocytes Relative: 4 %
Neutro Abs: 9.7 10*3/uL — ABNORMAL HIGH (ref 1.7–7.7)
Neutrophils Relative %: 87 %
Platelets: 200 10*3/uL (ref 150–400)
RBC: 3.39 MIL/uL — ABNORMAL LOW (ref 3.87–5.11)
RDW: 15.4 % (ref 11.5–15.5)
WBC: 11 10*3/uL — ABNORMAL HIGH (ref 4.0–10.5)
nRBC: 0 % (ref 0.0–0.2)

## 2023-01-08 LAB — HEPATIC FUNCTION PANEL
ALT: 14 U/L (ref 0–44)
AST: 26 U/L (ref 15–41)
Albumin: 3.4 g/dL — ABNORMAL LOW (ref 3.5–5.0)
Alkaline Phosphatase: 70 U/L (ref 38–126)
Bilirubin, Direct: 0.2 mg/dL (ref 0.0–0.2)
Indirect Bilirubin: 0.8 mg/dL (ref 0.3–0.9)
Total Bilirubin: 1 mg/dL (ref 0.3–1.2)
Total Protein: 5.8 g/dL — ABNORMAL LOW (ref 6.5–8.1)

## 2023-01-08 LAB — PROTIME-INR
INR: 1.1 (ref 0.8–1.2)
Prothrombin Time: 14 seconds (ref 11.4–15.2)

## 2023-01-08 LAB — TROPONIN I (HIGH SENSITIVITY): Troponin I (High Sensitivity): 45 ng/L — ABNORMAL HIGH (ref <18)

## 2023-01-08 LAB — MAGNESIUM: Magnesium: 2.2 mg/dL (ref 1.7–2.4)

## 2023-01-08 LAB — PHOSPHORUS: Phosphorus: 2.8 mg/dL (ref 2.5–4.6)

## 2023-01-08 LAB — TSH: TSH: 4.148 u[IU]/mL (ref 0.350–4.500)

## 2023-01-08 MED ORDER — ONDANSETRON 4 MG PO TBDP
4.0000 mg | ORAL_TABLET | Freq: Once | ORAL | Status: AC
  Administered 2023-01-08: 4 mg via ORAL
  Filled 2023-01-08: qty 1

## 2023-01-08 MED ORDER — HYDROMORPHONE HCL 1 MG/ML IJ SOLN
0.5000 mg | INTRAMUSCULAR | Status: DC | PRN
  Administered 2023-01-08 – 2023-01-09 (×2): 0.5 mg via INTRAVENOUS
  Filled 2023-01-08: qty 0.5
  Filled 2023-01-08: qty 1

## 2023-01-08 MED ORDER — LEVOTHYROXINE SODIUM 50 MCG PO TABS
50.0000 ug | ORAL_TABLET | ORAL | Status: DC
  Administered 2023-01-13: 50 ug via ORAL
  Filled 2023-01-08: qty 1

## 2023-01-08 MED ORDER — MONTELUKAST SODIUM 10 MG PO TABS
10.0000 mg | ORAL_TABLET | Freq: Every day | ORAL | Status: DC
  Administered 2023-01-08 – 2023-01-12 (×5): 10 mg via ORAL
  Filled 2023-01-08 (×5): qty 1

## 2023-01-08 MED ORDER — HYDROCODONE-ACETAMINOPHEN 5-325 MG PO TABS: 1.0000 | ORAL_TABLET | Freq: Four times a day (QID) | ORAL | Status: DC | PRN

## 2023-01-08 MED ORDER — ONDANSETRON HCL 4 MG/2ML IJ SOLN: 4.0000 mg | Freq: Four times a day (QID) | INTRAMUSCULAR | Status: DC | PRN

## 2023-01-08 MED ORDER — OXYCODONE HCL 5 MG PO TABS
5.0000 mg | ORAL_TABLET | Freq: Once | ORAL | Status: AC
  Administered 2023-01-08: 5 mg via ORAL
  Filled 2023-01-08: qty 1

## 2023-01-08 MED ORDER — METHOCARBAMOL 500 MG PO TABS: 500.0000 mg | ORAL_TABLET | Freq: Four times a day (QID) | ORAL | Status: DC | PRN

## 2023-01-08 MED ORDER — DIAZEPAM 5 MG PO TABS
5.0000 mg | ORAL_TABLET | Freq: Every evening | ORAL | Status: DC | PRN
  Administered 2023-01-08: 5 mg via ORAL
  Filled 2023-01-08: qty 1

## 2023-01-08 MED ORDER — LEVOTHYROXINE SODIUM 100 MCG PO TABS: 100.0000 ug | ORAL_TABLET | Freq: Every day | ORAL | Status: DC

## 2023-01-08 MED ORDER — METHOCARBAMOL 1000 MG/10ML IJ SOLN: 500.0000 mg | Freq: Four times a day (QID) | INTRAVENOUS | Status: DC | PRN

## 2023-01-08 MED ORDER — MUPIROCIN 2 % EX OINT
1.0000 | TOPICAL_OINTMENT | Freq: Two times a day (BID) | CUTANEOUS | Status: DC
  Administered 2023-01-08 – 2023-01-13 (×9): 1 via NASAL
  Filled 2023-01-08 (×4): qty 22

## 2023-01-08 MED ORDER — MORPHINE SULFATE (PF) 2 MG/ML IV SOLN
0.5000 mg | INTRAVENOUS | Status: DC | PRN
  Administered 2023-01-08 – 2023-01-09 (×2): 0.5 mg via INTRAVENOUS
  Filled 2023-01-08 (×2): qty 1

## 2023-01-08 MED ORDER — LEVOTHYROXINE SODIUM 100 MCG PO TABS
100.0000 ug | ORAL_TABLET | ORAL | Status: DC
  Administered 2023-01-10 – 2023-01-12 (×3): 100 ug via ORAL
  Filled 2023-01-08 (×3): qty 1

## 2023-01-08 NOTE — Assessment & Plan Note (Signed)
Restart valium 5 mg po qhs

## 2023-01-08 NOTE — ED Triage Notes (Signed)
Per [facility] pt coming from home for left knee pain. Patient states she was sitting in chair and slid out of it. Patients daughter states she hit back of her head on shelf and twisted her knee. Swelling and pain to left knee.

## 2023-01-08 NOTE — Assessment & Plan Note (Signed)
Will repeat if trending up will need echo and cardiology eval prior to OR Pt denies CP  ECG non specific changes No prior known hx of CAD

## 2023-01-08 NOTE — Consult Note (Signed)
Dana Pear, MD           Dana Dolly, PA-C  [facility], [place], [place]  [address]   [place]            MRN:  [id number] DOB/SEX:  05/03/1941/female    REQUESTING PHYSICIAN:    CHIEF COMPLAINT:  Painful left knee  HISTORY: Dana [name] a 82 y.o. female with a left distal femur fracture. She had a mechanical fall at home today with her daughter by her side. She could not put weight on her left leg so she came here to the ER and was found to have a distal femur fracture by x ray.    PAST MEDICAL HISTORY: Patient Active Problem List   Diagnosis Date Noted   History of basal cell carcinoma (BCC) 05/06/2021   History of SCC (squamous cell carcinoma) of skin 05/06/2021   Right knee pain 12/10/2020   Diverticulosis 09/03/2020   Aortic atherosclerosis (Dana [name]) by CT scan 08/2020 08/17/2020   Osteopenia 12/05/2018   FHx: heart disease 09/03/2018   Former smoker (quit 1985) 09/03/2018   BMI 21.0-21.9, adult 05/30/2018   Generalized OA 09/02/2015   Vitamin D deficiency 01/06/2014   Hypertension    Rheumatoid arthritis ([place])    Hereditary essential tremor    Hypothyroidism    Migraines    Hyperlipidemia, mixed    Chronic anemia    Past Medical History:  Diagnosis Date   Anemia    Cataract    Diverticulosis 09/03/2020   Environmental allergies    Gastric ulcer    Heartburn    Hiatal hernia    Hyperlipidemia    Hypertension    Hypothyroidism    Migraines    Prediabetes    Rash october 2012   all over abdomen and breasts   Rhabdomyolysis, traumatic   12/09/2020 12/09/2020   Rheumatoid arthritis(714.0)    Tremor, essential    right hand   Past Surgical History:  Procedure Laterality Date   ABDOMINAL HYSTERECTOMY     CATARACT EXTRACTION, BILATERAL     CHOLECYSTECTOMY  08/31/11   lap chole    COLONOSCOPY     TUBAL LIGATION       MEDICATIONS:  No current facility-administered  medications for this encounter.  Current Outpatient Medications:    acetaminophen (TYLENOL) 325 MG tablet, Take 2 tablets (650 mg total) by mouth every 6 (six) hours as needed., Disp: 30 tablet, Rfl: 0   alendronate (FOSAMAX) 70 MG tablet, Take 70 mg by mouth once a week. Take with a full glass of water on an empty stomach., Disp: , Rfl:    aspirin EC 81 MG tablet, Take 81 mg by mouth daily. Swallow whole., Disp: , Rfl:    Cholecalciferol (VITAMIN D) 125 MCG (5000 UT) CAPS, Take 10,000 Units by mouth every evening., Disp: , Rfl:    Cyanocobalamin (VITAMIN B 12 PO), Place 1,000 mcg under the tongue every evening., Disp: , Rfl:    diazepam (VALIUM) 5 MG tablet, TAKE 1 TABLET THREE TIMES DAILY  FOR  TREMOR, Disp: 90 tablet, Rfl: 0   dicyclomine (BENTYL) 20 MG tablet, TAKE 1 TABLET THREE TIMES DAILY BEFORE MEALS FOR CRAMPING, BLOATING, NAUSEA OR DIARRHEA, Disp: 270 tablet, Rfl: 0   folic acid (FOLVITE) Q000111Q MCG tablet, Take 1 tablet daily. (Patient taking differently: Take 800 mcg by mouth daily.), Disp: 90 tablet, Rfl: 1   furosemide (LASIX) 40 MG  tablet, TAKE 1 TABLET TWICE DAILY FOR FLUID RETENTION AND ANKLE  SWELLING, Disp: 180 tablet, Rfl: 10   hydrocortisone 2.5 % ointment, Apply topically 2 (two) times daily., Disp: , Rfl:    hydroxychloroquine (PLAQUENIL) 200 MG tablet, Take 200 mg by mouth See admin instructions. Take one tablet (100 mcg) by mouth daily, Disp: , Rfl:    levothyroxine (SYNTHROID) 100 MCG tablet, Take 1/2 tablet on Mon and Friday and 1 whole tablet  Daily  on an empty stomach with only water for 30 minutes & no Antacid meds, Calcium or Magnesium for 4 hours & avoid Biotin, Disp: 30 tablet, Rfl: 3   methotrexate (RHEUMATREX) 2.5 MG tablet, Take 10 mg by mouth See admin instructions. Take 4 tablets (10 mg) by mouth twice weekly - Tuesday and Wednesday, Disp: , Rfl:    montelukast (SINGULAIR) 10 MG tablet, TAKE 1 TABLET EVERY DAY, Disp: 90 tablet, Rfl: 3   Multiple Vitamin  (MULTIVITAMIN WITH MINERALS) TABS tablet, Take 1 tablet by mouth every evening., Disp: , Rfl:    ondansetron (ZOFRAN ODT) 8 MG disintegrating tablet, 42m ODT q4 hours prn nausea (Patient taking differently: Take 8 mg by mouth every 4 (four) hours as needed for nausea or vomiting.), Disp: 30 tablet, Rfl: 3   polyethylene glycol (MIRALAX / GLYCOLAX) 17 g packet, Take 17 g by mouth daily., Disp: 14 each, Rfl: 0   propranolol (INDERAL) 80 MG tablet, TAKE 1 TABLET TWICE DAILY FOR BLOOD PRESSURE AND TREMORS, Disp: 180 tablet, Rfl: 3   terbinafine (LAMISIL) 250 MG tablet, Take 1 tablet Daily for Toenail Fungus, Disp: 90 tablet, Rfl: 0   triamcinolone (KENALOG) 0.025 % ointment, Apply 1 Application topically 2 (two) times daily., Disp: 30 g, Rfl: 0  ALLERGIES:   Allergies  Allergen Reactions   Codeine Nausea Only   Mysoline [Primidone] Nausea Only and Other (See Comments)    Over-sedated   Sulfa Antibiotics Itching    REVIEW OF SYSTEMS: REVIEWED IN DETAIL IN CHART  FAMILY HISTORY:   Family History  Problem Relation Age of Onset   Heart disease Mother    Diabetes Mother    Heart disease Father    Heart disease Maternal Aunt        x3    Heart disease Maternal Uncle        x 4   Diabetes Sister        borderline   Heart attack Son 58      Sudden cardiac death   Colon cancer Neg Hx     SOCIAL HISTORY:   Social History   Tobacco Use   Smoking status: Former    Types: Cigarettes    Quit date: 09/18/1984    Years since quitting: 38.3   Smokeless tobacco: Never   Tobacco comments:    stopped around 25 years ago  Substance Use Topics   Alcohol use: No     EXAMINATION: Vital signs in last 24 hours: Temp:  [97.6 F (36.4 C)] 97.6 F (36.4 C) (02/18 1813) Pulse Rate:  [61-62] 62 (02/18 1721) Resp:  [17-18] 18 (02/18 1721) BP: (123-136)/(59-63) 123/63 (02/18 1721) SpO2:  [99 %-100 %] 99 % (02/18 1721) Weight:  [54.4 kg] 54.4 kg (02/18 1447)  General appearance: alert,  cooperative, and appears stated age Head: Normocephalic, without obvious abnormality, atraumatic Abdomen: normal findings: soft, non-tender Left knee is painful to palpate. Any attempted ROM causes extreme pain . Skin is benign. She is able to plantarflex and  dorsiflex ankle with no pain. Normal sensation to light touch throughout. She is neurovascularly in tact distally.    DIAGNOSTIC STUDIES: Recent laboratory studies: Recent Labs    01/08/23 1628  WBC 11.0*  HGB 10.9*  HCT 34.3*  PLT 200   Recent Labs    01/08/23 1628  NA 141  K 3.9  CL 103  CO2 28  BUN 12  CREATININE 0.67  GLUCOSE 96  CALCIUM 8.4*   No results found for: "INR", "PROTIME"   Recent Radiographic Studies :  CT HEAD WO CONTRAST (5MM)  Result Date: 01/08/2023 CLINICAL DATA:  Fall EXAM: CT HEAD WITHOUT CONTRAST CT CERVICAL SPINE WITHOUT CONTRAST TECHNIQUE: Multidetector CT imaging of the head and cervical spine was performed following the standard protocol without intravenous contrast. Multiplanar CT image reconstructions of the cervical spine were also generated. RADIATION DOSE REDUCTION: This exam was performed according to the departmental dose-optimization program which includes automated exposure control, adjustment of the mA and/or kV according to patient size and/or use of iterative reconstruction technique. COMPARISON:  02/07/2022 FINDINGS: CT HEAD FINDINGS Brain: No evidence of acute infarction, hemorrhage, hydrocephalus, extra-axial collection or mass lesion/mass effect. Mild periventricular white matter hypodensity. Vascular: No hyperdense vessel or unexpected calcification. Skull: Normal. Negative for fracture or focal lesion. Sinuses/Orbits: No acute finding. Other: None. CT CERVICAL SPINE FINDINGS Alignment: Normal. Skull base and vertebrae: No acute fracture. No primary bone lesion or focal pathologic process. Soft tissues and spinal canal: No prevertebral fluid or swelling. No visible canal hematoma.  Disc levels: Moderate space height loss and osteophytosis from C3-C6. Upper chest: Negative. Other: None. IMPRESSION: 1. No acute intracranial pathology. Small-vessel white matter disease. 2. No fracture or static subluxation of the cervical spine. 3. Moderate multilevel cervical disc degenerative disease. Electronically Signed   By: Delanna Ahmadi M.D.   On: 01/08/2023 16:09   CT CERVICAL SPINE WO CONTRAST  Result Date: 01/08/2023 CLINICAL DATA:  Fall EXAM: CT HEAD WITHOUT CONTRAST CT CERVICAL SPINE WITHOUT CONTRAST TECHNIQUE: Multidetector CT imaging of the head and cervical spine was performed following the standard protocol without intravenous contrast. Multiplanar CT image reconstructions of the cervical spine were also generated. RADIATION DOSE REDUCTION: This exam was performed according to the departmental dose-optimization program which includes automated exposure control, adjustment of the mA and/or kV according to patient size and/or use of iterative reconstruction technique. COMPARISON:  02/07/2022 FINDINGS: CT HEAD FINDINGS Brain: No evidence of acute infarction, hemorrhage, hydrocephalus, extra-axial collection or mass lesion/mass effect. Mild periventricular white matter hypodensity. Vascular: No hyperdense vessel or unexpected calcification. Skull: Normal. Negative for fracture or focal lesion. Sinuses/Orbits: No acute finding. Other: None. CT CERVICAL SPINE FINDINGS Alignment: Normal. Skull base and vertebrae: No acute fracture. No primary bone lesion or focal pathologic process. Soft tissues and spinal canal: No prevertebral fluid or swelling. No visible canal hematoma. Disc levels: Moderate space height loss and osteophytosis from C3-C6. Upper chest: Negative. Other: None. IMPRESSION: 1. No acute intracranial pathology. Small-vessel white matter disease. 2. No fracture or static subluxation of the cervical spine. 3. Moderate multilevel cervical disc degenerative disease. Electronically Signed    By: Delanna Ahmadi M.D.   On: 01/08/2023 16:09   DG Knee Left Port  Result Date: 01/08/2023 CLINICAL DATA:  [place], left knee pain and swelling EXAM: PORTABLE LEFT KNEE - 1-2 VIEW COMPARISON:  02/07/2022 FINDINGS: Frontal, bilateral oblique, and lateral views of the left knee are obtained. Bones are diffusely osteopenic. There is a comminuted intra-articular fracture involving the  distal left femur. Fracture lines involve the bilateral femoral condyles, with impaction and valgus angulation at the fracture sites. There is no dislocation. Moderate lipohemarthrosis. Diffuse soft tissue swelling. IMPRESSION: 1. Comminuted intra-articular fractures involving the bilateral femoral condyles, with impaction and valgus angulation at the fracture site. 2. Likely hemarthrosis. 3. Diffuse soft tissue swelling. 4. Marked osteopenia. Electronically Signed   By: Randa Ngo M.D.   On: 01/08/2023 15:46    ASSESSMENT: Left distal femur fracture after a mechanical fall.   PLAN: Due to the displacement and location of her fracture she will be best managed by a trauma specialist. I spoke with Dr. Doreatha Martin who would like a CT scan of her distal femur and then have her transferred to [facility] for likely surgery tomorrow. She will need to be admitted by the medicine service. She will be NPO after midnight tonight. We will put her into a knee immobilizer and keep her nonweightbearing.    Larwance Sachs Dimas Scheck 01/08/2023, 6:35 PM

## 2023-01-08 NOTE — Subjective & Objective (Signed)
Patient had a mechanical fall today she was sitting in the chair and slid down hit the back of her head on a shelf and twisted her knee noted significant pain and swelling to left knee Fall was witnessed by her daughter.  After the fall she could not bear any weight on her left leg she presented to emergency department Patient is taking baby aspirin but otherwise not on anticoagulation or antiplatelets besides that.

## 2023-01-08 NOTE — ED Notes (Signed)
Notified [facility], awaiting a truck.

## 2023-01-08 NOTE — Assessment & Plan Note (Signed)
Check TSH and restart synthroid 100 mcg po q day

## 2023-01-08 NOTE — Assessment & Plan Note (Signed)
Restart plaquenil 100 mg po q day when able

## 2023-01-08 NOTE — Assessment & Plan Note (Signed)
Obtain anemia panel 

## 2023-01-08 NOTE — Progress Notes (Signed)
Pt admitted to room 2W26 via [facility]. Transferred to bed, alert and oriented. No distress noted. Daughter now at bedside, admission info complete and in chart. Pt given hs meds and snack with plan for NPO after midnight for possible surgery tomorrow. Purewick replaced and intact for voiding. Pt oriented to room and fall precautions, as well as instruction on how to call for assist. Pain and anxiety meds given per request for followup with primary nurse. Left knee immobilizer intact with leg elevated. Vital signs stable.

## 2023-01-08 NOTE — ED Provider Notes (Signed)
[place] EMERGENCY DEPARTMENT AT [facility] Provider Note   CSN: [id number] Arrival date & time: 01/08/23  1445     History Chief Complaint  Patient presents with   Fall    HPI Dana [name] is a 82 y.o. female presenting for chief complaint of fall.  She is an 82 year old female with a extensive medical history including rheumatoid arthritis, hypertension, hyperlipidemia, squamous cell carcinoma. Patient states that she was sitting on a chair when she fell.  She hit the back of her head on a chair and her knee collapsed underneath her.  She has not been able to walk since the incident.  Denies any neurovascular changes.  Denies anticoagulation   Patient's recorded medical, surgical, social, medication list and allergies were reviewed in the Snapshot window as part of the initial history.   Review of Systems   Review of Systems  Constitutional:  Negative for chills and fever.  HENT:  Negative for ear pain and sore throat.   Eyes:  Negative for pain and visual disturbance.  Respiratory:  Negative for cough and shortness of breath.   Cardiovascular:  Negative for chest pain and palpitations.  Gastrointestinal:  Negative for abdominal pain and vomiting.  Genitourinary:  Negative for dysuria and hematuria.  Musculoskeletal:  Positive for joint swelling. Negative for arthralgias and back pain.  Skin:  Negative for color change and rash.  Neurological:  Negative for seizures and syncope.  All other systems reviewed and are negative.   Physical Exam Updated Vital Signs BP (!) 136/59   Pulse 61   Temp 97.6 F (36.4 C) (Oral)   Resp 17   Ht 5' 2.25" (1.581 m)   Wt 54.4 kg   SpO2 100%   BMI 21.77 kg/m  Physical Exam Vitals and nursing note reviewed.  Constitutional:      General: She is not in acute distress.    Appearance: She is well-developed.  HENT:     Head: Normocephalic and atraumatic.  Eyes:     Conjunctiva/sclera: Conjunctivae normal.   Cardiovascular:     Rate and Rhythm: Normal rate and regular rhythm.     Heart sounds: No murmur heard. Pulmonary:     Effort: Pulmonary effort is normal. No respiratory distress.     Breath sounds: Normal breath sounds.  Abdominal:     General: There is no distension.     Palpations: Abdomen is soft.     Tenderness: There is no abdominal tenderness. There is no right CVA tenderness or left CVA tenderness.  Musculoskeletal:        General: Deformity (Obvious deformity of the left knee.) present. No swelling or tenderness. Normal range of motion.     Cervical back: Neck supple.  Skin:    General: Skin is warm and dry.  Neurological:     General: No focal deficit present.     Mental Status: She is alert and oriented to person, place, and time. Mental status is at baseline.     Cranial Nerves: No cranial nerve deficit.      ED Course/ Medical Decision Making/ A&P    Procedures Procedures   Medications Ordered in ED Medications - No data to display Medical Decision Making:   Dana [name] is a 82 y.o. female who presented to the ED today with a fall at home  detailed above. They are not on a blood thinner. Patient's presentation is complicated by their history of advanced age.  Patient placed on continuous  vitals and telemetry monitoring while in ED which was reviewed periodically.   Complete initial physical exam performed, notably the patient  was hemodynamically stable  in no acute distress. No obvious deformities or injuries appreciated on extensive physical exam including active range of motion of all joints.     Reviewed and confirmed nursing documentation for past medical history, family history, social history.    Initial Assessment/Plan:   This is a patient presenting with a moderate blunt mechanism trauma.  As such, I have considered intracranial injuries including intracranial hemorrhage, intrathoracic injuries including blunt myocardial or blunt lung injury,  blunt abdominal injuries including aortic dissection, bladder injury, spleen injury, liver injury and I have considered orthopedic injuries including extremity or spinal injury. This is most consistent with an acute life/limb threatening illness complicated by underlying chronic conditions.  With the patient's presentation of moderate mechanism trauma but an otherwise reassuring exam, patient warrants targeted evaluation for potential traumatic injuries. Will proceed with targeted evaluation for potential injuries. Will proceed with CT Head, Cervical Spine CT.   Additionally, patient with pain at left knee. Will evaluate with XR. Images reviewed and agree with radiology interpretation.  CT HEAD WO CONTRAST (5MM)  Result Date: 01/08/2023 CLINICAL DATA:  Fall EXAM: CT HEAD WITHOUT CONTRAST CT CERVICAL SPINE WITHOUT CONTRAST TECHNIQUE: Multidetector CT imaging of the head and cervical spine was performed following the standard protocol without intravenous contrast. Multiplanar CT image reconstructions of the cervical spine were also generated. RADIATION DOSE REDUCTION: This exam was performed according to the departmental dose-optimization program which includes automated exposure control, adjustment of the mA and/or kV according to patient size and/or use of iterative reconstruction technique. COMPARISON:  02/07/2022 FINDINGS: CT HEAD FINDINGS Brain: No evidence of acute infarction, hemorrhage, hydrocephalus, extra-axial collection or mass lesion/mass effect. Mild periventricular white matter hypodensity. Vascular: No hyperdense vessel or unexpected calcification. Skull: Normal. Negative for fracture or focal lesion. Sinuses/Orbits: No acute finding. Other: None. CT CERVICAL SPINE FINDINGS Alignment: Normal. Skull base and vertebrae: No acute fracture. No primary bone lesion or focal pathologic process. Soft tissues and spinal canal: No prevertebral fluid or swelling. No visible canal hematoma. Disc levels:  Moderate space height loss and osteophytosis from C3-C6. Upper chest: Negative. Other: None. IMPRESSION: 1. No acute intracranial pathology. Small-vessel white matter disease. 2. No fracture or static subluxation of the cervical spine. 3. Moderate multilevel cervical disc degenerative disease. Electronically Signed   By: Delanna Ahmadi M.D.   On: 01/08/2023 16:09   CT CERVICAL SPINE WO CONTRAST  Result Date: 01/08/2023 CLINICAL DATA:  Fall EXAM: CT HEAD WITHOUT CONTRAST CT CERVICAL SPINE WITHOUT CONTRAST TECHNIQUE: Multidetector CT imaging of the head and cervical spine was performed following the standard protocol without intravenous contrast. Multiplanar CT image reconstructions of the cervical spine were also generated. RADIATION DOSE REDUCTION: This exam was performed according to the departmental dose-optimization program which includes automated exposure control, adjustment of the mA and/or kV according to patient size and/or use of iterative reconstruction technique. COMPARISON:  02/07/2022 FINDINGS: CT HEAD FINDINGS Brain: No evidence of acute infarction, hemorrhage, hydrocephalus, extra-axial collection or mass lesion/mass effect. Mild periventricular white matter hypodensity. Vascular: No hyperdense vessel or unexpected calcification. Skull: Normal. Negative for fracture or focal lesion. Sinuses/Orbits: No acute finding. Other: None. CT CERVICAL SPINE FINDINGS Alignment: Normal. Skull base and vertebrae: No acute fracture. No primary bone lesion or focal pathologic process. Soft tissues and spinal canal: No prevertebral fluid or swelling. No visible canal hematoma. Disc levels:  Moderate space height loss and osteophytosis from C3-C6. Upper chest: Negative. Other: None. IMPRESSION: 1. No acute intracranial pathology. Small-vessel white matter disease. 2. No fracture or static subluxation of the cervical spine. 3. Moderate multilevel cervical disc degenerative disease. Electronically Signed   By: Delanna Ahmadi M.D.   On: 01/08/2023 16:09   DG Knee Left Port  Result Date: 01/08/2023 CLINICAL DATA:  [place], left knee pain and swelling EXAM: PORTABLE LEFT KNEE - 1-2 VIEW COMPARISON:  02/07/2022 FINDINGS: Frontal, bilateral oblique, and lateral views of the left knee are obtained. Bones are diffusely osteopenic. There is a comminuted intra-articular fracture involving the distal left femur. Fracture lines involve the bilateral femoral condyles, with impaction and valgus angulation at the fracture sites. There is no dislocation. Moderate lipohemarthrosis. Diffuse soft tissue swelling. IMPRESSION: 1. Comminuted intra-articular fractures involving the bilateral femoral condyles, with impaction and valgus angulation at the fracture site. 2. Likely hemarthrosis. 3. Diffuse soft tissue swelling. 4. Marked osteopenia. Electronically Signed   By: Randa Ngo M.D.   On: 01/08/2023 15:46    Final Reassessment and Plan:   Severe comminuted left distal femur fracture.  Consulted [facility] who follows the patient the outpatient setting.  They stated that patient will likely need operative repair with Dr. Judee Clara in the a.m. tomorrow.  They recommended admission to main campus.  Hospitalist was consulted regarding assistance with arranging hospitalization for patient to main campus.  Disposition:   Based on the above findings, I believe this patient is stable for admission.    Patient/family educated about specific findings on our evaluation and explained exact reasons for admission.  Patient/family educated about clinical situation and time was allowed to answer questions.   Admission team communicated with and agreed with need for admission. Patient admitted. Patient ready to move at this time.     Emergency Department Medication Summary:   Medications  ondansetron (ZOFRAN-ODT) disintegrating tablet 4 mg (4 mg Oral Given 01/08/23 1621)  oxyCODONE (Oxy IR/ROXICODONE) immediate release tablet  5 mg (5 mg Oral Given 01/08/23 1621)          Clinical Impression: No diagnosis found.   Data Unavailable   Final Clinical Impression(s) / ED Diagnoses Final diagnoses:  None    Rx / DC Orders ED Discharge Orders     None         Tretha Sciara, MD 01/08/23 2326

## 2023-01-08 NOTE — ED Notes (Signed)
Spoke with Ortho and they will met the [facility] at [facility], to evaluate,

## 2023-01-08 NOTE — H&P (Addendum)
Dana [name] Seen [id number] DOB: 10/13/1941 DOA: 01/08/2023   PCP: Unk Pinto, MD   Outpatient Specialists:   [facility]  Patient arrived to ER on 01/08/23 at 1445 Referred by Attending Tretha Sciara, MD   Patient coming from:    home Lives alone  Chief Complaint:   Chief Complaint  Patient presents with   Fall    HPI: Dana [name] is a 82 y.o. female with medical history significant of   hypertension HLD prediabetes hypothyroidism rheumatoid arthritis vitamin D deficiency, anemia osteopenia aortic atherosclerosis based on CT scan history diverticulosis    Presented with left knee pain Patient had a mechanical fall today she was sitting in the chair and slid down hit the back of her head on a shelf and twisted her knee noted significant pain and swelling to left knee Fall was witnessed by her daughter.  After the fall she could not bear any weight on her left leg she presented to emergency department Patient is taking baby aspirin but otherwise not on anticoagulation or antiplatelets besides that.     Does not smoke or drink  Denies CP or SB  Was able to walk a flight of stairs  Or 100 ft with no SOB CP Deneis blood in stools or black stools Regarding pertinent Chronic problems:     HTN on propranolol  Essential tremor on Valium 5 mg 3 times a day and propranolol  Hypothyroidism:  Lab Results  Component Value Date   TSH 3.69 10/27/2022   on synthroid Rheumatoid arthritis on Plaquenil and methotrexate       Chronic anemia - baseline hg Hemoglobin & Hematocrit  Recent Labs    07/12/22 1544 10/27/22 1601 01/08/23 1628  HGB 10.8* 11.7 10.9*    While in ER:    Left knee showing comminuted intra-articular fractures involving bilateral femoral condyles with impaction and valgus angulation at the fracture site likely hemarthrosis diffuse soft tissue swelling CT HEAD/cervical spine   NON acute  CXR -  NON acute   Following  Medications were ordered in ER: Medications  HYDROmorphone (DILAUDID) injection 0.5 mg (has no administration in time range)  ondansetron (ZOFRAN) injection 4 mg (has no administration in time range)  ondansetron (ZOFRAN-ODT) disintegrating tablet 4 mg (4 mg Oral Given 01/08/23 1621)  oxyCODONE (Oxy IR/ROXICODONE) immediate release tablet 5 mg (5 mg Oral Given 01/08/23 1621)    _______________________________________________________ ER Provider Called:    [facility]  They Recommend admit to medicine    SEEN in ER admitted from [facility] will need operative intervention tomorrow n.p.o. postmidnight placed in knee immobilizer and keep nonweightbearing   ED Triage Vitals  Enc Vitals Group     BP 01/08/23 1448 (!) 136/59     Pulse Rate 01/08/23 1448 61     Resp 01/08/23 1448 17     Temp 01/08/23 1448 97.6 F (36.4 C)     Temp Source 01/08/23 1448 Oral     SpO2 01/08/23 1448 100 %     Weight 01/08/23 1447 120 lb (54.4 kg)     Height 01/08/23 1447 5' 2.25" (1.581 m)     Head Circumference --      Peak Flow --      Pain Score 01/08/23 1446 10     Pain Loc --      Pain Edu? --      Excl. in [place]? --   TMAX(24)@     _________________________________________ Significant  initial  Findings: Abnormal Labs Reviewed  CBC WITH DIFFERENTIAL/PLATELET - Abnormal; Notable for the following components:      Result Value   WBC 11.0 (*)    RBC 3.39 (*)    Hemoglobin 10.9 (*)    HCT 34.3 (*)    MCV 101.2 (*)    Neutro Abs 9.7 (*)    All other components within normal limits  BASIC METABOLIC PANEL - Abnormal; Notable for the following components:   Calcium 8.4 (*)    All other components within normal limits    _________________________ Troponin  ordered ECG: Ordered Heart rate 74  sinus rhythm Borderline right axis deviation Nonspecific T abnormalities, lateral leads QTc 475  The recent clinical data is shown below. Vitals:   01/08/23 1447 01/08/23 1448  01/08/23 1721 01/08/23 1813  BP:  (!) 136/59 123/63   Pulse:  61 62   Resp:  17 18   Temp:  97.6 F (36.4 C)  97.6 F (36.4 C)  TempSrc:  Oral  Oral  SpO2:  100% 99%   Weight: 54.4 kg     Height: 5' 2.25" (1.581 m)       WBC     Component Value Date/Time   WBC 11.0 (H) 01/08/2023 1628   LYMPHSABS 0.9 01/08/2023 1628   MONOABS 0.4 01/08/2023 1628   EOSABS 0.0 01/08/2023 1628   BASOSABS 0.0 01/08/2023 1628     UA  ordered    Results for orders placed or performed in visit on 04/05/22  MICROSCOPIC MESSAGE     Status: None   Collection Time: 04/05/22 10:52 AM  Result Value Ref Range Status   Note   Final    Comment: This urine was analyzed for the presence of WBC,  RBC, bacteria, casts, and other formed elements.  Only those elements seen were reported. . .     _______________________________________________ Hospitalist was called for admission for left tibial plateau fracture    The following Work up has been ordered so far:  Orders Placed This Encounter  Procedures   DG Knee Left Port   CT HEAD WO CONTRAST (5MM)   CT CERVICAL SPINE WO CONTRAST   DG Chest Portable 1 View   CBC with Differential   Basic metabolic panel   Diet NPO time specified   Diet NPO time specified   Apply ice to affected area (if injury is <48 hours old)   Apply knee immobilizer   Consult to orthopedic surgery   Consult to hospitalist   ED EKG     OTHER Significant initial  Findings:  labs showing:    Recent Labs  Lab 01/08/23 1628  NA 141  K 3.9  CO2 28  GLUCOSE 96  BUN 12  CREATININE 0.67  CALCIUM 8.4*    Cr   stable,  Lab Results  Component Value Date   CREATININE 0.67 01/08/2023   CREATININE 0.75 10/27/2022   CREATININE 0.80 07/12/2022    Recent Labs  Lab 01/08/23 1722  AST 26  ALT 14  ALKPHOS 70  BILITOT 1.0  PROT 5.8*  ALBUMIN 3.4*   Lab Results  Component Value Date   CALCIUM 8.4 (L) 01/08/2023    Plt: Lab Results  Component Value Date   PLT  200 01/08/2023     Recent Labs  Lab 01/08/23 1628  WBC 11.0*  NEUTROABS 9.7*  HGB 10.9*  HCT 34.3*  MCV 101.2*  PLT 200    HG/HCT  stable  Component Value Date/Time   HGB 10.9 (L) 01/08/2023 1628   HCT 34.3 (L) 01/08/2023 1628   MCV 101.2 (H) 01/08/2023 1628      DM  labs:  HbA1C: Recent Labs    04/05/22 1052  HGBA1C 5.0      CBG (last 3)  No results for input(s): "GLUCAP" in the last 72 hours.    Cultures:    Component Value Date/Time   SDES URINE, RANDOM 12/09/2020 2324   SPECREQUEST  12/09/2020 2324    NONE Performed at [facility] [address], [place], [place] [address]    CULT MULTIPLE SPECIES PRESENT, SUGGEST RECOLLECTION (A) 12/09/2020 2324   REPTSTATUS 12/11/2020 FINAL 12/09/2020 2324     Radiological Exams on Admission: CT HEAD WO CONTRAST (5MM)  Result Date: 01/08/2023 CLINICAL DATA:  Fall EXAM: CT HEAD WITHOUT CONTRAST CT CERVICAL SPINE WITHOUT CONTRAST TECHNIQUE: Multidetector CT imaging of the head and cervical spine was performed following the standard protocol without intravenous contrast. Multiplanar CT image reconstructions of the cervical spine were also generated. RADIATION DOSE REDUCTION: This exam was performed according to the departmental dose-optimization program which includes automated exposure control, adjustment of the mA and/or kV according to patient size and/or use of iterative reconstruction technique. COMPARISON:  02/07/2022 FINDINGS: CT HEAD FINDINGS Brain: No evidence of acute infarction, hemorrhage, hydrocephalus, extra-axial collection or mass lesion/mass effect. Mild periventricular white matter hypodensity. Vascular: No hyperdense vessel or unexpected calcification. Skull: Normal. Negative for fracture or focal lesion. Sinuses/Orbits: No acute finding. Other: None. CT CERVICAL SPINE FINDINGS Alignment: Normal. Skull base and vertebrae: No acute fracture. No primary bone lesion or focal pathologic process. Soft  tissues and spinal canal: No prevertebral fluid or swelling. No visible canal hematoma. Disc levels: Moderate space height loss and osteophytosis from C3-C6. Upper chest: Negative. Other: None. IMPRESSION: 1. No acute intracranial pathology. Small-vessel white matter disease. 2. No fracture or static subluxation of the cervical spine. 3. Moderate multilevel cervical disc degenerative disease. Electronically Signed   By: Delanna Ahmadi M.D.   On: 01/08/2023 16:09   CT CERVICAL SPINE WO CONTRAST  Result Date: 01/08/2023 CLINICAL DATA:  Fall EXAM: CT HEAD WITHOUT CONTRAST CT CERVICAL SPINE WITHOUT CONTRAST TECHNIQUE: Multidetector CT imaging of the head and cervical spine was performed following the standard protocol without intravenous contrast. Multiplanar CT image reconstructions of the cervical spine were also generated. RADIATION DOSE REDUCTION: This exam was performed according to the departmental dose-optimization program which includes automated exposure control, adjustment of the mA and/or kV according to patient size and/or use of iterative reconstruction technique. COMPARISON:  02/07/2022 FINDINGS: CT HEAD FINDINGS Brain: No evidence of acute infarction, hemorrhage, hydrocephalus, extra-axial collection or mass lesion/mass effect. Mild periventricular white matter hypodensity. Vascular: No hyperdense vessel or unexpected calcification. Skull: Normal. Negative for fracture or focal lesion. Sinuses/Orbits: No acute finding. Other: None. CT CERVICAL SPINE FINDINGS Alignment: Normal. Skull base and vertebrae: No acute fracture. No primary bone lesion or focal pathologic process. Soft tissues and spinal canal: No prevertebral fluid or swelling. No visible canal hematoma. Disc levels: Moderate space height loss and osteophytosis from C3-C6. Upper chest: Negative. Other: None. IMPRESSION: 1. No acute intracranial pathology. Small-vessel white matter disease. 2. No fracture or static subluxation of the cervical  spine. 3. Moderate multilevel cervical disc degenerative disease. Electronically Signed   By: Delanna Ahmadi M.D.   On: 01/08/2023 16:09   DG Knee Left Port  Result Date: 01/08/2023 CLINICAL DATA:  [place], left knee  pain and swelling EXAM: PORTABLE LEFT KNEE - 1-2 VIEW COMPARISON:  02/07/2022 FINDINGS: Frontal, bilateral oblique, and lateral views of the left knee are obtained. Bones are diffusely osteopenic. There is a comminuted intra-articular fracture involving the distal left femur. Fracture lines involve the bilateral femoral condyles, with impaction and valgus angulation at the fracture sites. There is no dislocation. Moderate lipohemarthrosis. Diffuse soft tissue swelling. IMPRESSION: 1. Comminuted intra-articular fractures involving the bilateral femoral condyles, with impaction and valgus angulation at the fracture site. 2. Likely hemarthrosis. 3. Diffuse soft tissue swelling. 4. Marked osteopenia. Electronically Signed   By: Randa Ngo M.D.   On: 01/08/2023 15:46   _______________________________________________________________________________________________________ Latest  Blood pressure 123/63, pulse 62, temperature 97.6 F (36.4 C), temperature source Oral, resp. rate 18, height 5' 2.25" (1.581 m), weight 54.4 kg, SpO2 99 %.   Vitals  labs and radiology finding personally reviewed  Review of Systems:    Pertinent positives include: fall  Constitutional:  No weight loss, night sweats, Fevers, chills, fatigue, weight loss  HEENT:  No headaches, Difficulty swallowing,Tooth/dental problems,Sore throat,  No sneezing, itching, ear ache, nasal congestion, post nasal drip,  Cardio-vascular:  No chest pain, Orthopnea, PND, anasarca, dizziness, palpitations.no Bilateral lower extremity swelling  GI:  No heartburn, indigestion, abdominal pain, nausea, vomiting, diarrhea, change in bowel habits, loss of appetite, melena, blood in stool, hematemesis Resp:  no shortness of breath at rest.  No dyspnea on exertion, No excess mucus, no productive cough, No non-productive cough, No coughing up of blood.No change in color of mucus.No wheezing. Skin:  no rash or lesions. No jaundice GU:  no dysuria, change in color of urine, no urgency or frequency. No straining to urinate.  No flank pain.  Musculoskeletal:  No joint pain or no joint swelling. No decreased range of motion. No back pain.  Psych:  No change in mood or affect. No depression or anxiety. No memory loss.  Neuro: no localizing neurological complaints, no tingling, no weakness, no double vision, no gait abnormality, no slurred speech, no confusion  All systems reviewed and apart from [place] all are negative _______________________________________________________________________________________________ Past Medical History:   Past Medical History:  Diagnosis Date   Anemia    Cataract    Diverticulosis 09/03/2020   Environmental allergies    Gastric ulcer    Heartburn    Hiatal hernia    Hyperlipidemia    Hypertension    Hypothyroidism    Migraines    Prediabetes    Rash october 2012   all over abdomen and breasts   Rhabdomyolysis, traumatic   12/09/2020 12/09/2020   Rheumatoid arthritis(714.0)    Tremor, essential    right hand      Past Surgical History:  Procedure Laterality Date   ABDOMINAL HYSTERECTOMY     CATARACT EXTRACTION, BILATERAL     CHOLECYSTECTOMY  08/31/11   lap chole    COLONOSCOPY     TUBAL LIGATION      Social History:  Ambulatory  cane     reports that she quit smoking about 38 years ago. Her smoking use included cigarettes. She has never used smokeless tobacco. She reports that she does not drink alcohol and does not use drugs.   Family History:   Family History  Problem Relation Age of Onset   Heart disease Mother    Diabetes Mother    Heart disease Father    Heart disease Maternal Aunt        x3  Heart disease Maternal Uncle        x 4   Diabetes Sister         borderline   Heart attack Son 62       Sudden cardiac death   Colon cancer Neg Hx    ____________ _________ Allergies: Allergies  Allergen Reactions   Codeine Nausea Only   Mysoline [Primidone] Nausea Only and Other (See Comments)    Over-sedated   Sulfa Antibiotics Itching     Prior to Admission medications   Medication Sig Start Date End Date Taking? Authorizing Provider  acetaminophen (TYLENOL) 325 MG tablet Take 2 tablets (650 mg total) by mouth every 6 (six) hours as needed. 06/26/21   Jaynee Eagles, PA-C  alendronate (FOSAMAX) 70 MG tablet Take 70 mg by mouth once a week. Take with a full glass of water on an empty stomach.    [provider]  aspirin EC 81 MG tablet Take 81 mg by mouth daily. Swallow whole.    [provider]  Cholecalciferol (VITAMIN D) 125 MCG (5000 UT) CAPS Take 10,000 Units by mouth every evening.    [provider]  Cyanocobalamin (VITAMIN B 12 PO) Place 1,000 mcg under the tongue every evening.    [provider]  diazepam (VALIUM) 5 MG tablet TAKE 1 TABLET THREE TIMES DAILY  FOR  TREMOR 01/02/23   Darrol Jump, NP  dicyclomine (BENTYL) 20 MG tablet TAKE 1 TABLET THREE TIMES DAILY BEFORE MEALS FOR CRAMPING, BLOATING, NAUSEA OR DIARRHEA 02/23/22   Alycia Rossetti, NP  folic acid (FOLVITE) [id number] MCG tablet Take 1 tablet daily. Patient taking differently: Take 800 mcg by mouth daily. 05/19/20   Unk Pinto, MD  furosemide (LASIX) 40 MG tablet TAKE 1 TABLET TWICE DAILY FOR FLUID RETENTION AND ANKLE  SWELLING 08/26/22   Alycia Rossetti, NP  hydrocortisone 2.5 % ointment Apply topically 2 (two) times daily.    [provider]  hydroxychloroquine (PLAQUENIL) 200 MG tablet Take 200 mg by mouth See admin instructions. Take one tablet (100 mcg) by mouth daily 03/22/17   [provider]  levothyroxine (SYNTHROID) 100 MCG tablet Take 1/2 tablet on Mon and Friday and 1 whole tablet  Daily  on an empty stomach with  only water for 30 minutes & no Antacid meds, Calcium or Magnesium for 4 hours & avoid Biotin 10/11/22   Alycia Rossetti, NP  methotrexate (RHEUMATREX) 2.5 MG tablet Take 10 mg by mouth See admin instructions. Take 4 tablets (10 mg) by mouth twice weekly - Tuesday and Wednesday 02/10/17   [provider]  montelukast (SINGULAIR) 10 MG tablet TAKE 1 TABLET EVERY DAY 11/23/22   Darrol Jump, NP  Multiple Vitamin (MULTIVITAMIN WITH MINERALS) TABS tablet Take 1 tablet by mouth every evening.    [provider]  ondansetron (ZOFRAN ODT) 8 MG disintegrating tablet 10m ODT q4 hours prn nausea Patient taking differently: Take 8 mg by mouth every 4 (four) hours as needed for nausea or vomiting. 04/19/16   ORolene Course PA-C  polyethylene glycol (MIRALAX / GLYCOLAX) 17 g packet Take 17 g by mouth daily. 12/14/20   KBonnielee Haff MD  propranolol (INDERAL) 80 MG tablet TAKE 1 TABLET TWICE DAILY FOR BLOOD PRESSURE AND TREMORS 05/08/22   [name]  terbinafine (LAMISIL) 250 MG tablet Take 1 tablet Daily for Toenail Fungus 01/04/23   [name]  triamcinolone (KENALOG) 0.025 % ointment Apply 1 Application topically 2 (two) times daily.  10/27/22   Darrol Jump, NP    ___________________________________________________________________________________________________ Physical Exam:    01/08/2023    5:21 PM 01/08/2023    2:48 PM 01/08/2023    2:47 PM  Vitals with BMI  Height   5' 2.25"  Weight   120 lbs  BMI   [id number]  Systolic [id number]   Diastolic 63 59   Pulse 62 61     1. General:  in No  Acute distress    Chronically ill  -appearing 2. Psychological: Alert and   Oriented 3. Head/ENT:    Dry Mucous Membranes                          Head Non traumatic, neck supple                        Poor Dentition 4. SKIN:  decreased Skin turgor,  Skin clean Dry and intact no rash 5. Heart: Regular rate and rhythm no  Murmur, no Rub or gallop 6. Lungs: no wheezes or  crackles   7. Abdomen: Soft,  non-tender, Non distended  bowel sounds present 8. Lower extremities: no clubbing, cyanosis, no  edema left leg in knee immobilizer  9. Neurologically Grossly intact, moving all 4 extremities equally   10. MSK: Normal range of motion    Chart has been reviewed  _____________________________________________________________________________________________81 y.o. female with medical history significant of   hypertension HLD prediabetes hypothyroidism rheumatoid arthritis vitamin D deficiency, anemia osteopenia aortic atherosclerosis based on CT scan history diverticulosis     Assessment/Plan  82 y.o. female with medical history significant of   hypertension HLD prediabetes hypothyroidism rheumatoid arthritis vitamin D deficiency, anemia osteopenia aortic atherosclerosis based on CT scan history diverticulosis       Admitted for left knee fracture   Present on Admission:  Left tibial fracture  Rheumatoid arthritis ([place])  Hereditary essential tremor  Hypothyroidism  Chronic anemia  Elevated troponin     Left tibial fracture NPO post midnight  Transfer to [facility] Knee immobilizer applied   - management as per orthopedics,  plan to operate   in  a.m.  Keep nothing by mouth post midnight. Patient antiplatelet agents   on hold Ordered type and screen,  order a vitamin D level  Patient at baseline  able to walk a flight of stairs or 100 feet     Patient denies any chest pain or shortness of breath currently and/or with exertion,  ECG ordered         Rheumatoid arthritis (HCC) Restart plaquenil 100 mg po q day when able  Hereditary essential tremor Restart valium 5 mg po qhs  Hypothyroidism Check TSH and restart synthroid 100 mcg po q day  Chronic anemia Ob tain anemia panel  Elevated troponin Will repeat if trending up will need echo and cardiology eval prior to OR Pt denies CP  ECG non specific changes No prior known hx of CAD    Other  plan as per orders.  DVT prophylaxis:  SCD     Code Status:    Code Status: Prior FULL CODE family  I had personally discussed CODE STATUS with patient and family     Family Communication:   Family  at  Bedside  plan of care was discussed with  Daughter,   Disposition Plan:     likely will need placement for rehabilitation  Following barriers for discharge:                            Electrolytes corrected                               Anemia corrected                             Pain controlled with PO medications                               Afebrile, white count improving able to transition to PO antibiotics                             Will need to be able to tolerate PO                            Will likely need home health, home O2, set up                                   Consults called:  orthopedics  Admission status:  ED Disposition     ED Disposition  [place]: [facility] [[id number]]  Level of Care: Med-Surg [[id number]]  May admit patient to [facility] or [facility] if equivalent level of care is available:: No  Covid Evaluation: Asymptomatic - no recent exposure (last 10 days) testing not required  Diagnosis: Left tibial fracture [id number]  Admitting Physician: Toy Baker [[id number]  Attending Physician: Toy Baker [id number]  Certification:: I certify this patient will need inpatient services for at least 2 midnights  Estimated Length of Stay: 2            inpatient     I Expect 2 midnight stay secondary to severity of patient's current illness need for inpatient interventions justified by the following:  Severe lab/radiological/exam abnormalities including:   Left tibial plateau fracture    That are currently affecting medical management.   I expect  patient to be hospitalized for 2 midnights requiring inpatient medical care.  Patient is at high risk for adverse  outcome (such as loss of life or disability) if not treated.  Indication for inpatient stay as follows:    severe pain requiring acute inpatient management,    Need for operative/procedural  intervention    Need for  IV fluids  IV pain medications     Level of care       medical floor      Sharian Delia 01/08/2023, 8:01 PM    [facility]     after 2 AM please page floor coverage PA If 7AM-7PM, please contact the day team taking care of the patient using [email or web address]   Patient was evaluated in the context of the global COVID-19 pandemic, which necessitated consideration that the patient might be at risk for infection with the SARS-CoV-2 virus that causes COVID-19. Institutional protocols and algorithms that pertain to the evaluation of patients at risk for COVID-19 are in a state of rapid change based on information released by regulatory bodies  including the CDC and federal and state organizations. These policies and algorithms were followed during the patient's care.

## 2023-01-08 NOTE — ED Notes (Signed)
Spoke with MD,  notified that the patient and family would like a update about their results.

## 2023-01-08 NOTE — ED Notes (Signed)
Place the knee immobilizer on the patient but she is very uncomfortable, spoke with MD ,and she wants to reach out to the ortho tech to see if they are able to assist. Page them, awaiting a response.

## 2023-01-08 NOTE — Assessment & Plan Note (Addendum)
NPO post midnight  Transfer to [facility] Knee immobilizer applied   - management as per orthopedics,  plan to operate   in  a.m.  Keep nothing by mouth post midnight. Patient antiplatelet agents   on hold Ordered type and screen,  order a vitamin D level  Patient at baseline  able to walk a flight of stairs or 100 feet     Patient denies any chest pain or shortness of breath currently and/or with exertion,  ECG ordered

## 2023-01-09 ENCOUNTER — Inpatient Hospital Stay (HOSPITAL_COMMUNITY): Payer: Medicare Other

## 2023-01-09 ENCOUNTER — Encounter (HOSPITAL_COMMUNITY): Payer: Self-pay | Admitting: Internal Medicine

## 2023-01-09 ENCOUNTER — Inpatient Hospital Stay (HOSPITAL_COMMUNITY): Payer: Medicare Other | Admitting: Anesthesiology

## 2023-01-09 ENCOUNTER — Encounter (HOSPITAL_COMMUNITY)
Admission: EM | Disposition: A | Discharge status: Home / Self Care | Payer: Self-pay | Source: Home / Self Care | Attending: Internal Medicine

## 2023-01-09 DIAGNOSIS — R7989 Other specified abnormal findings of blood chemistry: Secondary | ICD-10-CM | POA: Diagnosis not present

## 2023-01-09 DIAGNOSIS — S728X2A Other fracture of left femur, initial encounter for closed fracture: Secondary | ICD-10-CM | POA: Diagnosis not present

## 2023-01-09 DIAGNOSIS — I10 Essential (primary) hypertension: Secondary | ICD-10-CM

## 2023-01-09 DIAGNOSIS — S72452A Displaced supracondylar fracture without intracondylar extension of lower end of left femur, initial encounter for closed fracture: Secondary | ICD-10-CM | POA: Diagnosis not present

## 2023-01-09 DIAGNOSIS — D638 Anemia in other chronic diseases classified elsewhere: Secondary | ICD-10-CM

## 2023-01-09 DIAGNOSIS — Z87891 Personal history of nicotine dependence: Secondary | ICD-10-CM | POA: Diagnosis not present

## 2023-01-09 DIAGNOSIS — S72463A Displaced supracondylar fracture with intracondylar extension of lower end of unspecified femur, initial encounter for closed fracture: Secondary | ICD-10-CM | POA: Insufficient documentation

## 2023-01-09 DIAGNOSIS — E039 Hypothyroidism, unspecified: Secondary | ICD-10-CM

## 2023-01-09 HISTORY — PX: ORIF FEMUR FRACTURE: SHX2119

## 2023-01-09 LAB — URINALYSIS, COMPLETE (UACMP) WITH MICROSCOPIC
Bacteria, UA: NONE SEEN
Bilirubin Urine: NEGATIVE
Glucose, UA: NEGATIVE mg/dL
Hgb urine dipstick: NEGATIVE
Ketones, ur: NEGATIVE mg/dL
Leukocytes,Ua: NEGATIVE
Nitrite: NEGATIVE
Protein, ur: NEGATIVE mg/dL
Specific Gravity, Urine: 1.025 (ref 1.005–1.030)
pH: 5.5 (ref 5.0–8.0)

## 2023-01-09 LAB — CBC
HCT: 27.4 % — ABNORMAL LOW (ref 36.0–46.0)
Hemoglobin: 9 g/dL — ABNORMAL LOW (ref 12.0–15.0)
MCH: 32.6 pg (ref 26.0–34.0)
MCHC: 32.8 g/dL (ref 30.0–36.0)
MCV: 99.3 fL (ref 80.0–100.0)
Platelets: 165 10*3/uL (ref 150–400)
RBC: 2.76 MIL/uL — ABNORMAL LOW (ref 3.87–5.11)
RDW: 15.5 % (ref 11.5–15.5)
WBC: 9.3 10*3/uL (ref 4.0–10.5)
nRBC: 0 % (ref 0.0–0.2)

## 2023-01-09 LAB — ECHOCARDIOGRAM COMPLETE
AR max vel: 2.37 cm2
AV Area VTI: 2.28 cm2
AV Area mean vel: 2.34 cm2
AV Mean grad: 8.5 mmHg
AV Peak grad: 16.6 mmHg
Ao pk vel: 2.04 m/s
Area-P 1/2: 3.28 cm2
Height: 62.25 in
S' Lateral: 2 cm
Weight: 1920 oz

## 2023-01-09 LAB — SURGICAL PCR SCREEN
MRSA, PCR: NEGATIVE
Staphylococcus aureus: NEGATIVE

## 2023-01-09 LAB — ABO/RH: ABO/RH(D): O POS

## 2023-01-09 LAB — PREALBUMIN: Prealbumin: 17 mg/dL — ABNORMAL LOW (ref 18–38)

## 2023-01-09 LAB — CREATININE, SERUM
Creatinine, Ser: 0.76 mg/dL (ref 0.44–1.00)
GFR, Estimated: >60 mL/min (ref >60)

## 2023-01-09 LAB — TROPONIN I (HIGH SENSITIVITY): Troponin I (High Sensitivity): 56 ng/L — ABNORMAL HIGH (ref <18)

## 2023-01-09 SURGERY — OPEN REDUCTION INTERNAL FIXATION (ORIF) DISTAL FEMUR FRACTURE
Anesthesia: General | Site: Leg Lower | Laterality: Left

## 2023-01-09 MED ORDER — ENOXAPARIN SODIUM 40 MG/0.4ML IJ SOSY
40.0000 mg | PREFILLED_SYRINGE | INTRAMUSCULAR | Status: DC
  Administered 2023-01-10 – 2023-01-13 (×4): 40 mg via SUBCUTANEOUS
  Filled 2023-01-09 (×4): qty 0.4

## 2023-01-09 MED ORDER — HYDROCODONE-ACETAMINOPHEN 7.5-325 MG PO TABS
1.0000 | ORAL_TABLET | ORAL | Status: DC | PRN
  Administered 2023-01-13: 1 via ORAL
  Filled 2023-01-09 (×2): qty 1

## 2023-01-09 MED ORDER — TRANEXAMIC ACID-NACL 1000-0.7 MG/100ML-% IV SOLN
1000.0000 mg | INTRAVENOUS | Status: AC
  Administered 2023-01-09: 1000 mg via INTRAVENOUS

## 2023-01-09 MED ORDER — CEFAZOLIN SODIUM-DEXTROSE 2-4 GM/100ML-% IV SOLN
2.0000 g | Freq: Three times a day (TID) | INTRAVENOUS | Status: AC
  Administered 2023-01-09 – 2023-01-10 (×3): 2 g via INTRAVENOUS
  Filled 2023-01-09 (×3): qty 100

## 2023-01-09 MED ORDER — CEFAZOLIN SODIUM-DEXTROSE 2-4 GM/100ML-% IV SOLN
INTRAVENOUS | Status: AC
  Filled 2023-01-09: qty 100

## 2023-01-09 MED ORDER — DOCUSATE SODIUM 100 MG PO CAPS
100.0000 mg | ORAL_CAPSULE | Freq: Two times a day (BID) | ORAL | Status: DC
  Administered 2023-01-09 – 2023-01-13 (×8): 100 mg via ORAL
  Filled 2023-01-09 (×8): qty 1

## 2023-01-09 MED ORDER — SUGAMMADEX SODIUM 200 MG/2ML IV SOLN
INTRAVENOUS | Status: DC | PRN
  Administered 2023-01-09: 100 mg via INTRAVENOUS

## 2023-01-09 MED ORDER — 0.9 % SODIUM CHLORIDE (POUR BTL) OPTIME
TOPICAL | Status: DC | PRN
  Administered 2023-01-09: 1000 mL

## 2023-01-09 MED ORDER — METOCLOPRAMIDE HCL 5 MG/ML IJ SOLN: 5.0000 mg | Freq: Three times a day (TID) | INTRAMUSCULAR | Status: DC | PRN

## 2023-01-09 MED ORDER — ACETAMINOPHEN 325 MG PO TABS: 325.0000 mg | ORAL_TABLET | Freq: Four times a day (QID) | ORAL | Status: DC | PRN

## 2023-01-09 MED ORDER — CEFAZOLIN SODIUM-DEXTROSE 2-4 GM/100ML-% IV SOLN
2.0000 g | INTRAVENOUS | Status: AC
  Administered 2023-01-09: 2 g via INTRAVENOUS

## 2023-01-09 MED ORDER — TRANEXAMIC ACID-NACL 1000-0.7 MG/100ML-% IV SOLN
INTRAVENOUS | Status: AC
  Filled 2023-01-09: qty 100

## 2023-01-09 MED ORDER — ROCURONIUM BROMIDE 10 MG/ML (PF) SYRINGE
PREFILLED_SYRINGE | INTRAVENOUS | Status: DC | PRN
  Administered 2023-01-09: 30 mg via INTRAVENOUS

## 2023-01-09 MED ORDER — CHLORHEXIDINE GLUCONATE 0.12 % MT SOLN
OROMUCOSAL | Status: AC
  Administered 2023-01-09: 15 mL via OROMUCOSAL
  Filled 2023-01-09: qty 15

## 2023-01-09 MED ORDER — FENTANYL CITRATE (PF) 250 MCG/5ML IJ SOLN
INTRAMUSCULAR | Status: AC
  Filled 2023-01-09: qty 5

## 2023-01-09 MED ORDER — AMISULPRIDE (ANTIEMETIC) 5 MG/2ML IV SOLN: 10.0000 mg | Freq: Once | INTRAVENOUS | Status: DC | PRN

## 2023-01-09 MED ORDER — SODIUM CHLORIDE 0.9 % IV SOLN: INTRAVENOUS | Status: DC

## 2023-01-09 MED ORDER — ONDANSETRON HCL 4 MG/2ML IJ SOLN
INTRAMUSCULAR | Status: DC | PRN
  Administered 2023-01-09: 4 mg via INTRAVENOUS

## 2023-01-09 MED ORDER — PROPOFOL 10 MG/ML IV BOLUS
INTRAVENOUS | Status: DC | PRN
  Administered 2023-01-09: 60 mg via INTRAVENOUS

## 2023-01-09 MED ORDER — DEXAMETHASONE SODIUM PHOSPHATE 10 MG/ML IJ SOLN
INTRAMUSCULAR | Status: DC | PRN
  Administered 2023-01-09: 4 mg via INTRAVENOUS

## 2023-01-09 MED ORDER — ADULT MULTIVITAMIN W/MINERALS CH
1.0000 | ORAL_TABLET | Freq: Every day | ORAL | Status: DC
  Administered 2023-01-09 – 2023-01-13 (×5): 1 via ORAL
  Filled 2023-01-09 (×5): qty 1

## 2023-01-09 MED ORDER — METOCLOPRAMIDE HCL 5 MG PO TABS: 5.0000 mg | ORAL_TABLET | Freq: Three times a day (TID) | ORAL | Status: DC | PRN

## 2023-01-09 MED ORDER — CHLORHEXIDINE GLUCONATE 4 % EX LIQD: 60.0000 mL | Freq: Once | CUTANEOUS | Status: DC

## 2023-01-09 MED ORDER — FENTANYL CITRATE (PF) 100 MCG/2ML IJ SOLN
25.0000 ug | INTRAMUSCULAR | Status: DC | PRN
  Administered 2023-01-09: 25 ug via INTRAVENOUS

## 2023-01-09 MED ORDER — LACTATED RINGERS IV SOLN: INTRAVENOUS | Status: DC

## 2023-01-09 MED ORDER — FENTANYL CITRATE (PF) 250 MCG/5ML IJ SOLN
INTRAMUSCULAR | Status: DC | PRN
  Administered 2023-01-09: 50 ug via INTRAVENOUS

## 2023-01-09 MED ORDER — HYDROCODONE-ACETAMINOPHEN 5-325 MG PO TABS
1.0000 | ORAL_TABLET | ORAL | Status: DC | PRN
  Administered 2023-01-11 – 2023-01-12 (×2): 1 via ORAL
  Filled 2023-01-09 (×2): qty 1

## 2023-01-09 MED ORDER — HYDROMORPHONE HCL 1 MG/ML IJ SOLN
0.5000 mg | INTRAMUSCULAR | Status: DC | PRN
  Administered 2023-01-09 – 2023-01-12 (×4): 0.5 mg via INTRAVENOUS
  Filled 2023-01-09 (×5): qty 0.5

## 2023-01-09 MED ORDER — FENTANYL CITRATE (PF) 100 MCG/2ML IJ SOLN
INTRAMUSCULAR | Status: AC
  Filled 2023-01-09: qty 2

## 2023-01-09 MED ORDER — POLYETHYLENE GLYCOL 3350 17 G PO PACK: 17.0000 g | PACK | Freq: Every day | ORAL | Status: DC | PRN

## 2023-01-09 MED ORDER — CHLORHEXIDINE GLUCONATE 0.12 % MT SOLN: 15.0000 mL | Freq: Once | OROMUCOSAL | Status: AC

## 2023-01-09 MED ORDER — LIDOCAINE 2% (20 MG/ML) 5 ML SYRINGE
INTRAMUSCULAR | Status: DC | PRN
  Administered 2023-01-09: 50 mg via INTRAVENOUS

## 2023-01-09 MED ORDER — PHENYLEPHRINE HCL-NACL 20-0.9 MG/250ML-% IV SOLN
INTRAVENOUS | Status: DC | PRN
  Administered 2023-01-09: 30 ug/min via INTRAVENOUS

## 2023-01-09 MED ORDER — OXYCODONE HCL 5 MG/5ML PO SOLN: 5.0000 mg | Freq: Once | ORAL | Status: DC | PRN

## 2023-01-09 MED ORDER — OXYCODONE HCL 5 MG PO TABS: 5.0000 mg | ORAL_TABLET | Freq: Once | ORAL | Status: DC | PRN

## 2023-01-09 MED ORDER — POVIDONE-IODINE 10 % EX SWAB: 2.0000 | Freq: Once | CUTANEOUS | Status: DC

## 2023-01-09 MED ORDER — ORAL CARE MOUTH RINSE: 15.0000 mL | Freq: Once | OROMUCOSAL | Status: AC

## 2023-01-09 MED ORDER — VANCOMYCIN HCL 1000 MG IV SOLR
INTRAVENOUS | Status: DC | PRN
  Administered 2023-01-09: 1000 mg via TOPICAL

## 2023-01-09 MED ORDER — PROPRANOLOL HCL 20 MG PO TABS
80.0000 mg | ORAL_TABLET | Freq: Two times a day (BID) | ORAL | Status: DC
  Administered 2023-01-10 – 2023-01-13 (×5): 80 mg via ORAL
  Filled 2023-01-09 (×7): qty 4

## 2023-01-09 SURGICAL SUPPLY — 74 items
ADH SKN CLS APL DERMABOND .7 (GAUZE/BANDAGES/DRESSINGS) ×1
APL PRP STRL LF DISP 70% ISPRP (MISCELLANEOUS) ×2
BAG COUNTER SPONGE SURGICOUNT (BAG) ×1 IMPLANT
BAG SPNG CNTER NS LX DISP (BAG) ×1
BIT DRILL 4.3 (BIT) ×1
BIT DRILL 4.3X300MM (BIT) IMPLANT
BIT DRILL LONG 3.3 (BIT) IMPLANT
BIT DRILL QC 3.3X195 (BIT) IMPLANT
BLADE CLIPPER SURG (BLADE) IMPLANT
BNDG CMPR 5X6 CHSV STRCH STRL (GAUZE/BANDAGES/DRESSINGS)
BNDG CMPR MED 10X6 ELC LF (GAUZE/BANDAGES/DRESSINGS)
BNDG CMPR STD VLCR NS LF 5.8X4 (GAUZE/BANDAGES/DRESSINGS) ×1
BNDG COHESIVE 6X5 TAN ST LF (GAUZE/BANDAGES/DRESSINGS) ×1 IMPLANT
BNDG ELASTIC 4X5.8 VLCR NS LF (GAUZE/BANDAGES/DRESSINGS) IMPLANT
BNDG ELASTIC 6X10 VLCR STRL LF (GAUZE/BANDAGES/DRESSINGS) ×1 IMPLANT
BNDG ELASTIC 6X5.8 VLCR STR LF (GAUZE/BANDAGES/DRESSINGS) IMPLANT
BRUSH SCRUB EZ PLAIN DRY (MISCELLANEOUS) ×2 IMPLANT
CANISTER SUCT 3000ML PPV (MISCELLANEOUS) ×1 IMPLANT
CAP LOCK NCB (Cap) IMPLANT
CHLORAPREP W/TINT 26 (MISCELLANEOUS) ×1 IMPLANT
COVER SURGICAL LIGHT HANDLE (MISCELLANEOUS) ×1 IMPLANT
DERMABOND ADVANCED .7 DNX12 (GAUZE/BANDAGES/DRESSINGS) IMPLANT
DRAPE C-ARM 42X72 X-RAY (DRAPES) ×1 IMPLANT
DRAPE C-ARMOR (DRAPES) ×1 IMPLANT
DRAPE HALF SHEET 40X57 (DRAPES) ×2 IMPLANT
DRAPE ORTHO SPLIT 77X108 STRL (DRAPES) ×2
DRAPE SURG 17X23 STRL (DRAPES) ×1 IMPLANT
DRAPE SURG ORHT 6 SPLT 77X108 (DRAPES) ×2 IMPLANT
DRAPE U-SHAPE 47X51 STRL (DRAPES) ×1 IMPLANT
DRSG ADAPTIC 3X8 NADH LF (GAUZE/BANDAGES/DRESSINGS) IMPLANT
DRSG MEPILEX BORDER 4X12 (GAUZE/BANDAGES/DRESSINGS) IMPLANT
DRSG MEPILEX BORDER 4X4 (GAUZE/BANDAGES/DRESSINGS) IMPLANT
DRSG MEPILEX BORDER 4X8 (GAUZE/BANDAGES/DRESSINGS) IMPLANT
DRSG MEPITEL 4X7.2 (GAUZE/BANDAGES/DRESSINGS) IMPLANT
ELECT REM PT RETURN 9FT ADLT (ELECTROSURGICAL) ×1
ELECTRODE REM PT RTRN 9FT ADLT (ELECTROSURGICAL) ×1 IMPLANT
GAUZE PAD ABD 8X10 STRL (GAUZE/BANDAGES/DRESSINGS) ×3 IMPLANT
GAUZE SPONGE 4X4 12PLY STRL (GAUZE/BANDAGES/DRESSINGS) ×1 IMPLANT
GLOVE BIO SURGEON STRL SZ 6.5 (GLOVE) ×3 IMPLANT
GLOVE BIO SURGEON STRL SZ7.5 (GLOVE) ×4 IMPLANT
GLOVE BIOGEL PI IND STRL 6.5 (GLOVE) ×1 IMPLANT
GLOVE BIOGEL PI IND STRL 7.5 (GLOVE) ×1 IMPLANT
GOWN STRL REUS W/ TWL LRG LVL3 (GOWN DISPOSABLE) ×3 IMPLANT
GOWN STRL REUS W/TWL LRG LVL3 (GOWN DISPOSABLE) ×3
K-WIRE 2.0 (WIRE) ×1
K-WIRE FXSTD 280X2XNS SS (WIRE) ×1
KIT BASIN OR (CUSTOM PROCEDURE TRAY) ×1 IMPLANT
KIT TURNOVER KIT B (KITS) ×1 IMPLANT
KWIRE FXSTD 280X2XNS SS (WIRE) IMPLANT
NS IRRIG 1000ML POUR BTL (IV SOLUTION) ×1 IMPLANT
PACK TOTAL JOINT (CUSTOM PROCEDURE TRAY) ×1 IMPLANT
PAD ARMBOARD 7.5X6 YLW CONV (MISCELLANEOUS) ×2 IMPLANT
PAD CAST 4YDX4 CTTN HI CHSV (CAST SUPPLIES) ×1 IMPLANT
PADDING CAST COTTON 4X4 STRL (CAST SUPPLIES) ×1
PADDING CAST COTTON 6X4 STRL (CAST SUPPLIES) ×1 IMPLANT
PLATE DIST FEM 12H (Plate) IMPLANT
SCREW 5.0 80MM (Screw) IMPLANT
SCREW CORTICAL NCB 5.0X40 (Screw) IMPLANT
SCREW NCB 4.0MX38M (Screw) IMPLANT
SCREW NCB 5.0X85MM (Screw) IMPLANT
SPONGE T-LAP 18X18 ~~LOC~~+RFID (SPONGE) IMPLANT
STAPLER VISISTAT 35W (STAPLE) ×1 IMPLANT
SUCTION FRAZIER HANDLE 10FR (MISCELLANEOUS) ×1
SUCTION TUBE FRAZIER 10FR DISP (MISCELLANEOUS) ×1 IMPLANT
SUT ETHILON 3 0 PS 1 (SUTURE) ×2 IMPLANT
SUT VIC AB 0 CT1 27 (SUTURE)
SUT VIC AB 0 CT1 27XBRD ANBCTR (SUTURE) IMPLANT
SUT VIC AB 1 CT1 27 (SUTURE)
SUT VIC AB 1 CT1 27XBRD ANBCTR (SUTURE) IMPLANT
SUT VIC AB 2-0 CT1 27 (SUTURE) ×2
SUT VIC AB 2-0 CT1 TAPERPNT 27 (SUTURE) ×2 IMPLANT
TOWEL GREEN STERILE (TOWEL DISPOSABLE) ×2 IMPLANT
TRAY FOLEY MTR SLVR 16FR STAT (SET/KITS/TRAYS/PACK) IMPLANT
WATER STERILE IRR 1000ML POUR (IV SOLUTION) ×2 IMPLANT

## 2023-01-09 NOTE — Anesthesia Postprocedure Evaluation (Signed)
Anesthesia Post Note  Patient: Dana [name]  Procedure(s) Performed: OPEN REDUCTION INTERNAL FIXATION (ORIF) DISTAL FEMUR FRACTURE (Left: Leg Lower)     Patient location during evaluation: PACU Anesthesia Type: General Level of consciousness: awake Pain management: pain level controlled Vital Signs Assessment: post-procedure vital signs reviewed and stable Respiratory status: spontaneous breathing, nonlabored ventilation and respiratory function stable Cardiovascular status: blood pressure returned to baseline and stable Postop Assessment: no apparent nausea or vomiting Anesthetic complications: yes   Encounter Notable Events  Notable Event Outcome Phase Comment  Difficult to intubate - expected  Intraprocedure Filed from anesthesia note documentation.    Last Vitals:  Vitals:   01/09/23 1645 01/09/23 1648  BP: (!) 145/69   Pulse: 89 95  Resp: 12 14  Temp:    SpO2: 100% 94%    Last Pain:  Vitals:   01/09/23 1645  TempSrc:   PainSc: Asleep                 Nilda Simmer

## 2023-01-09 NOTE — Progress Notes (Signed)
Patient in bed, alert and oriented, transferred from 2w, with left leg fracture, in immobilizer, vss, NPO, patient not voided on purewick, bladder scan done, 445 retention. Hospitalist, Lillia Mountain, waiting for call back.

## 2023-01-09 NOTE — Progress Notes (Signed)
PROGRESS NOTE    Dana [name]  [id number] DOB: Aug 25, 1941 DOA: 01/08/2023 PCP: Unk Pinto, MD    Brief Narrative:   Dana [name] is a 82 y.o. female with past medical history significant for HTN, HLD, prediabetes, vitamin D deficiency, rheumatoid arthritis, anemia, osteopenia, aortic atherosclerosis who presented to [facility] ED on 2/18 with complaints of left knee pain.  [name]  Fall was witnessed by her daughter.  In the ED, WBC 11.0, hemoglobin 10.9, platelets 200.  Sodium 141, potassium 3.9, chloride 103, CO2 28, glucose 96, BUN 12, creatinine 0.67.  AST 26, ALT 14, total bilirubin 0.8.  High sensitive troponin 45>56.  INR 1.1.  TSH 4.148.  Urinalysis unrevealing.  CT head/C-spine with no acute intracranial pathology, small vessel white matter disease, no fracture or subluxation of the C-spine.  Chest x-ray with no acute abnormality.  Left knee x-ray with comminuted intra-articular fractures involving the bilateral femoral condyles with impaction and varus angulation, likely hemarthrosis with diffuse soft tissue swelling.  Orthopedics was consulted.  TRH was consulted for further evaluation management of left femur fracture fracture.  Assessment & Plan:   Left distal femur fracture Patient presenting to ED with severe left lower extremity pain with inability to ambulate following fall at home.  Imaging notable for comminuted displaced intra-articular distal femoral fracture with impaction and angulation, nondisplaced fracture anterior aspect lateral tibial plateau, tiny fracture upper tip of patella, diffuse osteopenia, tiny area AVN anterior aspect of the lateral femoral condyle. -- Orthopedics following, appreciate assistance -- Norco 1-2 tabs every 6 hours as needed moderate pain -- Dilaudid 0.5 mg IV q2h PRN severe pain --  N.p.o., plan ORIF today  Elevated troponin High sensitive troponin elevated 45 followed by 56.  EKG with NSR, no concerning dynamic changes.  Patient denies chest pain.  Etiology likely secondary to type II demand ischemia in the setting of femur fracture as above. -- TTE: Pending -- Continue monitor on telemetry  Essential hypertension -- Restart propranolol 80 mg p.o. twice daily tomorrow  Hypothyroidism TSH 4.148, within normal limits. --Continue levothyroxine 100 mcg on S/T/W/T/S and 92mg on M/F  Rheumatoid arthritis On methotrexate, Plaquenil.  Hx tremor -- Diazepam 5 mg -- Propranolol   DVT prophylaxis: SCDs Start: 01/08/23 2104    Code Status: Full Code Family Communication: No family present at bedside this morning  Disposition Plan:  Level of care: Telemetry Medical Status is: Inpatient Remains inpatient appropriate because: Pending surgical intervention for femur fracture    Consultants:  Orthopedics, Dr. HDoreatha Martin Procedures:  None  Antimicrobials:  None   Subjective: Patient seen examined bedside, resting calmly.  Reports difficulty getting in a "comfortable" position.  Continues with mild to moderate pain.  Where she can have something to drink.  Awaiting surgical intervention this afternoon for her femur fracture.  Reports that she lives alone but daughter lives next-door.  No other specific complaints or concerns at this time.  Denies headache, no dizziness, no chest pain, no palpitations, no shortness of breath, no abdominal pain, no focal weakness, no fever/chills/night sweats, no nausea cefonicid diarrhea, no paresthesias.  No acute events overnight per nursing staff.  Objective: Vitals:   01/08/23 2153 01/09/23 0048 01/09/23 0357 01/09/23 0817  BP: 127/89 (!) 132/56 129/62 129/60  Pulse: 79 84 88 95  Resp: 17 17  19 16  Temp: 97.9 F (36.6 C) 98.6 F (37 C) 98.4 F (36.9 C) 98.3 F (36.8 C)  TempSrc: Oral Oral Oral Oral  SpO2: 98% 100% 100%  97%  Weight:      Height:        Intake/Output Summary (Last 24 hours) at 01/09/2023 1111 Last data filed at 01/09/2023 0500 Gross per 24 hour  Intake --  Output 442 ml  Net -442 ml   Filed Weights   01/08/23 1447  Weight: 54.4 kg    Examination:  Physical Exam: GEN: NAD, alert and oriented x 3, wd/wn, elderly in appearance HEENT: NCAT, PERRL, EOMI, sclera clear, MMM PULM: CTAB w/o wheezes/crackles, normal respiratory effort, on room air CV: RRR w/o M/G/R GI: abd soft, NTND, NABS, no R/G/M MSK: no peripheral edema, left knee TTP; did not attempt ROM at this time NEURO: No focal deficits PSYCH: normal mood/affect Integumentary: dry/intact, no rashes or wounds    Data Reviewed: I have personally reviewed following labs and imaging studies  CBC: Recent Labs  Lab 01/08/23 1628  WBC 11.0*  NEUTROABS 9.7*  HGB 10.9*  HCT 34.3*  MCV 101.2*  PLT [id number]   Basic Metabolic Panel: Recent Labs  Lab 01/08/23 1628 01/08/23 1722  NA 141  --   K 3.9  --   CL 103  --   CO2 28  --   GLUCOSE 96  --   BUN 12  --   CREATININE 0.67  --   CALCIUM 8.4*  --   MG  --  2.2  PHOS  --  2.8   GFR: Estimated Creatinine Clearance: 42.6 mL/min (by C-G formula based on SCr of 0.67 mg/dL). Liver Function Tests: Recent Labs  Lab 01/08/23 1722  AST 26  ALT 14  ALKPHOS 70  BILITOT 1.0  PROT 5.8*  ALBUMIN 3.4*   No results for input(s): "LIPASE", "AMYLASE" in the last 168 hours. No results for input(s): "AMMONIA" in the last 168 hours. Coagulation Profile: Recent Labs  Lab 01/08/23 1722  INR 1.1   Cardiac Enzymes: No results for input(s): "CKTOTAL", "CKMB", "CKMBINDEX", "TROPONINI" in the last 168 hours. BNP (last 3 results) No results for input(s): "PROBNP" in the last 8760 hours. HbA1C: No results for input(s): "HGBA1C" in the last 72 hours. CBG: No results for input(s): "GLUCAP" in the last 168 hours. Lipid Profile: No results for input(s): "CHOL", "HDL", "LDLCALC",  "TRIG", "CHOLHDL", "LDLDIRECT" in the last 72 hours. Thyroid Function Tests: Recent Labs    01/08/23 1722  TSH 4.148   Anemia Panel: No results for input(s): "VITAMINB12", "FOLATE", "FERRITIN", "TIBC", "IRON", "RETICCTPCT" in the last 72 hours. Sepsis Labs: No results for input(s): "PROCALCITON", "LATICACIDVEN" in the last 168 hours.  Recent Results (from the past 240 hour(s))  Surgical PCR screen     Status: None   Collection Time: 01/08/23  9:38 PM   Specimen: Nasal Mucosa; Nasal Swab  Result Value Ref Range Status   MRSA, PCR NEGATIVE NEGATIVE Final   Staphylococcus aureus NEGATIVE NEGATIVE Final    Comment: (NOTE) The Xpert SA Assay (FDA approved for NASAL specimens in patients 50 years of age and older), is one component of a comprehensive surveillance program. It is not intended to diagnose infection nor to guide or monitor treatment. Performed at [facility], [place] [address], [place], [place] [address]          Radiology Studies: CT KNEE LEFT WO CONTRAST  Result Date: 01/09/2023 CLINICAL DATA:  Knee surgical planning EXAM: CT OF THE LEFT KNEE WITHOUT CONTRAST TECHNIQUE: Multidetector CT imaging of the left knee was performed according to the standard protocol. Multiplanar CT image reconstructions were also generated. RADIATION DOSE REDUCTION: This exam was performed according to the departmental dose-optimization program which includes automated exposure control, adjustment of the mA and/or kV according to patient size and/or use of iterative reconstruction technique. COMPARISON:  Knee radiograph 01/08/2023 FINDINGS: Bones/Joint/Cartilage There is a comminuted and displaced distal femoral metaphyseal fracture with intra-articular extension through the lateral trochlea, with impaction and posterior angulation. Diffuse osteopenia. There is a nondisplaced fracture of the anterior aspect of the lateral tibial plateau. There is a tiny fracture at the upper tip of the  patella (series 4, image 54). There is a large lipohemarthrosis. Tiny area of avascular necrosis in the anterior aspect of the lateral femoral condyle (series 8, image 61, series 7, image 42). Ligaments Suboptimally assessed by CT. Muscles and Tendons No acute myotendinous abnormality by CT. Soft tissues Soft tissue swelling along the knee.  No focal fluid collection. IMPRESSION: Comminuted and displaced intra-articular distal femoral metaphyseal fracture with impaction and angulation. Nondisplaced fracture of the anterior aspect of the lateral tibial plateau. Tiny fracture at the upper tip of the patella. Diffuse osteopenia. Tiny area of AVN in the anterior aspect of the lateral femoral condyle. Large lipohemarthrosis. Electronically Signed   By: Maurine Simmering M.D.   On: 01/09/2023 08:22   DG Chest Portable 1 View  Result Date: 01/08/2023 CLINICAL DATA:  Preoperative EXAM: PORTABLE CHEST 1 VIEW COMPARISON:  12/09/2020 FINDINGS: The heart size and mediastinal contours are within normal limits. Both lungs are clear. The visualized skeletal structures are unremarkable. IMPRESSION: No acute abnormality of the lungs in AP portable projection. Electronically Signed   By: Delanna Ahmadi M.D.   On: 01/08/2023 19:36   CT HEAD WO CONTRAST (5MM)  Result Date: 01/08/2023 CLINICAL DATA:  Fall EXAM: CT HEAD WITHOUT CONTRAST CT CERVICAL SPINE WITHOUT CONTRAST TECHNIQUE: Multidetector CT imaging of the head and cervical spine was performed following the standard protocol without intravenous contrast. Multiplanar CT image reconstructions of the cervical spine were also generated. RADIATION DOSE REDUCTION: This exam was performed according to the departmental dose-optimization program which includes automated exposure control, adjustment of the mA and/or kV according to patient size and/or use of iterative reconstruction technique. COMPARISON:  02/07/2022 FINDINGS: CT HEAD FINDINGS Brain: No evidence of acute infarction,  hemorrhage, hydrocephalus, extra-axial collection or mass lesion/mass effect. Mild periventricular white matter hypodensity. Vascular: No hyperdense vessel or unexpected calcification. Skull: Normal. Negative for fracture or focal lesion. Sinuses/Orbits: No acute finding. Other: None. CT CERVICAL SPINE FINDINGS Alignment: Normal. Skull base and vertebrae: No acute fracture. No primary bone lesion or focal pathologic process. Soft tissues and spinal canal: No prevertebral fluid or swelling. No visible canal hematoma. Disc levels: Moderate space height loss and osteophytosis from C3-C6. Upper chest: Negative. Other: None. IMPRESSION: 1. No acute intracranial pathology. Small-vessel white matter disease. 2. No fracture or static subluxation of the cervical spine. 3. Moderate multilevel cervical disc degenerative disease. Electronically Signed   By: Delanna Ahmadi M.D.   On: 01/08/2023 16:09   CT CERVICAL SPINE WO CONTRAST  Result Date: 01/08/2023 CLINICAL DATA:  Fall EXAM: CT HEAD WITHOUT CONTRAST CT CERVICAL SPINE WITHOUT CONTRAST TECHNIQUE: Multidetector CT imaging of the head and cervical spine was performed following the standard protocol without intravenous contrast. Multiplanar CT image reconstructions of the cervical spine were also generated. RADIATION  DOSE REDUCTION: This exam was performed according to the departmental dose-optimization program which includes automated exposure control, adjustment of the mA and/or kV according to patient size and/or use of iterative reconstruction technique. COMPARISON:  02/07/2022 FINDINGS: CT HEAD FINDINGS Brain: No evidence of acute infarction, hemorrhage, hydrocephalus, extra-axial collection or mass lesion/mass effect. Mild periventricular white matter hypodensity. Vascular: No hyperdense vessel or unexpected calcification. Skull: Normal. Negative for fracture or focal lesion. Sinuses/Orbits: No acute finding. Other: None. CT CERVICAL SPINE FINDINGS Alignment: Normal.  Skull base and vertebrae: No acute fracture. No primary bone lesion or focal pathologic process. Soft tissues and spinal canal: No prevertebral fluid or swelling. No visible canal hematoma. Disc levels: Moderate space height loss and osteophytosis from C3-C6. Upper chest: Negative. Other: None. IMPRESSION: 1. No acute intracranial pathology. Small-vessel white matter disease. 2. No fracture or static subluxation of the cervical spine. 3. Moderate multilevel cervical disc degenerative disease. Electronically Signed   By: Delanna Ahmadi M.D.   On: 01/08/2023 16:09   DG Knee Left Port  Result Date: 01/08/2023 CLINICAL DATA:  [place], left knee pain and swelling EXAM: PORTABLE LEFT KNEE - 1-2 VIEW COMPARISON:  02/07/2022 FINDINGS: Frontal, bilateral oblique, and lateral views of the left knee are obtained. Bones are diffusely osteopenic. There is a comminuted intra-articular fracture involving the distal left femur. Fracture lines involve the bilateral femoral condyles, with impaction and valgus angulation at the fracture sites. There is no dislocation. Moderate lipohemarthrosis. Diffuse soft tissue swelling. IMPRESSION: 1. Comminuted intra-articular fractures involving the bilateral femoral condyles, with impaction and valgus angulation at the fracture site. 2. Likely hemarthrosis. 3. Diffuse soft tissue swelling. 4. Marked osteopenia. Electronically Signed   By: Randa Ngo M.D.   On: 01/08/2023 15:46        Scheduled Meds:  [START ON 01/10/2023] levothyroxine  100 mcg Oral Once per day on Sun Tue Wed Thu Sat   levothyroxine  50 mcg Oral Once per day on Mon Fri   montelukast  10 mg Oral QHS   mupirocin ointment  1 Application Nasal BID   Continuous Infusions:  methocarbamol (ROBAXIN) IV       LOS: 1 day    Time spent: 51 minutes spent on chart review, discussion with nursing staff, consultants, updating family and interview/physical exam; more than 50% of that time was spent in counseling and/or  coordination of care.    Yudit Modesitt J [place] DO [facility] Available via Epic secure chat 7am-7pm After these hours, please refer to coverage provider listed on [email or web address] 01/09/2023, 11:11 AM

## 2023-01-09 NOTE — Progress Notes (Signed)
Consent signed and in chart for ORIF surgery today

## 2023-01-09 NOTE — Progress Notes (Signed)
Patient in bed, alert and oriented, transferred from 2w, with left leg fracture, in immobilizer, vss, NPO, patient not voided on purewick, bladder scan done, 445 retention. Hospitalist, Lillia Mountain, waiting for call back.   Orders received for foley.

## 2023-01-09 NOTE — Progress Notes (Signed)
Per pt request called daughter [name] [phone] and let her know that surgery is scheduled today at 1pm. Phone went straight to voicemail so I left a message.

## 2023-01-09 NOTE — Progress Notes (Signed)
Patient arrived back to [place] room 31 alert and oriented, pain level 4/10. Daughter at bedside. Call light in reach. Bed in lowest position. Will continue to monitor pt.

## 2023-01-09 NOTE — Consult Note (Signed)
Reason for Consult:Left distal femur fx Referring Physician: [name] Time called: 1209 Time at bedside: Dana [name] is an 82 y.o. female.  HPI: Dana [name] was at home and was walking to the back of her house when it sounds like she had a syncopal event and fell. She had immediate left knee pain and could not get up. She was brought to the ED where x-rays showed a distal femur fx in addition to some more minor fxs about the knee. She was admitted and orthopedic surgery was consulted. She lives at home alone and should be using a cane but doesn't always.  Past Medical History:  Diagnosis Date   Anemia    Cataract    Diverticulosis 09/03/2020   Environmental allergies    Gastric ulcer    Heartburn    Hiatal hernia    Hyperlipidemia    Hypertension    Hypothyroidism    Migraines    Prediabetes    Rash october 2012   all over abdomen and breasts   Rhabdomyolysis, traumatic   12/09/2020 12/09/2020   Rheumatoid arthritis(714.0)    Tremor, essential    right hand    Past Surgical History:  Procedure Laterality Date   ABDOMINAL HYSTERECTOMY     CATARACT EXTRACTION, BILATERAL     CHOLECYSTECTOMY  08/31/11   lap chole    COLONOSCOPY     TUBAL LIGATION      Family History  Problem Relation Age of Onset   Heart disease Mother    Diabetes Mother    Heart disease Father    Heart disease Maternal Aunt        x3    Heart disease Maternal Uncle        x 4   Diabetes Sister        borderline   Heart attack Son 57       Sudden cardiac death   Colon cancer Neg Hx     Social History:  reports that she quit smoking about 38 years ago. Her smoking use included cigarettes. She has never used smokeless tobacco. She reports that she does not drink alcohol and does not use drugs.  Allergies:  Allergies  Allergen Reactions   Codeine Nausea Only   Mysoline [Primidone] Nausea Only and Other (See Comments)    Over-sedated   Sulfa Antibiotics Itching    Medications: I  have reviewed the patient's current medications.  Results for orders placed or performed during the hospital encounter of 01/08/23 (from the past 48 hour(s))  CBC with Differential     Status: Abnormal   Collection Time: 01/08/23  4:28 PM  Result Value Ref Range   WBC 11.0 (H) 4.0 - 10.5 K/uL   RBC 3.39 (L) 3.87 - 5.11 MIL/uL   Hemoglobin 10.9 (L) 12.0 - 15.0 g/dL   HCT 34.3 (L) 36.0 - 46.0 %   MCV 101.2 (H) 80.0 - 100.0 fL   MCH 32.2 26.0 - 34.0 pg   MCHC 31.8 30.0 - 36.0 g/dL   RDW 15.4 11.5 - 15.5 %   Platelets 200 150 - 400 K/uL   nRBC 0.0 0.0 - 0.2 %   Neutrophils Relative % 87 %   Neutro Abs 9.7 (H) 1.7 - 7.7 K/uL   Lymphocytes Relative 8 %   Lymphs Abs 0.9 0.7 - 4.0 K/uL   Monocytes Relative 4 %   Monocytes Absolute 0.4 0.1 - 1.0 K/uL   Eosinophils Relative 0 %  Eosinophils Absolute 0.0 0.0 - 0.5 K/uL   Basophils Relative 0 %   Basophils Absolute 0.0 0.0 - 0.1 K/uL   Immature Granulocytes 1 %   Abs Immature Granulocytes 0.07 0.00 - 0.07 K/uL    Comment: Performed at [facility], [place] [address], [place], [place] [id number]  Basic metabolic panel     Status: Abnormal   Collection Time: 01/08/23  4:28 PM  Result Value Ref Range   Sodium 141 135 - 145 mmol/L   Potassium 3.9 3.5 - 5.1 mmol/L   Chloride 103 98 - 111 mmol/L   CO2 28 22 - 32 mmol/L   Glucose, Bld 96 70 - 99 mg/dL    Comment: Glucose reference range applies only to samples taken after fasting for at least 8 hours.   BUN 12 8 - 23 mg/dL   Creatinine, Ser 0.67 0.44 - 1.00 mg/dL   Calcium 8.4 (L) 8.9 - 10.3 mg/dL   GFR, Estimated >60 >60 mL/min    Comment: (NOTE) Calculated using the CKD-EPI Creatinine Equation (2021)    Anion gap 10 5 - 15    Comment: Performed at [facility], [place] [address], [place], [place] [address]  Hepatic function panel     Status: Abnormal   Collection Time: 01/08/23  5:22 PM  Result Value Ref Range   Total Protein 5.8 (L) 6.5 - 8.1 g/dL    Albumin 3.4 (L) 3.5 - 5.0 g/dL   AST 26 15 - 41 U/L   ALT 14 0 - 44 U/L   Alkaline Phosphatase 70 38 - 126 U/L   Total Bilirubin 1.0 0.3 - 1.2 mg/dL   Bilirubin, Direct 0.2 0.0 - 0.2 mg/dL   Indirect Bilirubin 0.8 0.3 - 0.9 mg/dL    Comment: Performed at [facility], [place] [address], [place], [place] [address]  Magnesium     Status: None   Collection Time: 01/08/23  5:22 PM  Result Value Ref Range   Magnesium 2.2 1.7 - 2.4 mg/dL    Comment: Performed at [facility], [place] [address], [place], [place] [address]  Phosphorus     Status: None   Collection Time: 01/08/23  5:22 PM  Result Value Ref Range   Phosphorus 2.8 2.5 - 4.6 mg/dL    Comment: Performed at [facility], [place] [address] [place], [place] [address]  Protime-INR     Status: None   Collection Time: 01/08/23  5:22 PM  Result Value Ref Range   Prothrombin Time 14.0 11.4 - 15.2 seconds   INR 1.1 0.8 - 1.2    Comment: (NOTE) INR goal varies based on device and disease states. Performed at [facility], [place] [address] [place], [place] [address]   TSH     Status: None   Collection Time: 01/08/23  5:22 PM  Result Value Ref Range   TSH 4.148 0.350 - 4.500 uIU/mL    Comment: Performed by a 3rd Generation assay with a functional sensitivity of <=0.01 uIU/mL. Performed at [facility], [place] [address] [place], [place] [address]   Troponin I (High Sensitivity)     Status: Abnormal   Collection Time: 01/08/23  5:22 PM  Result Value Ref Range   Troponin I (High Sensitivity) 45 (H) <18 ng/L    Comment: (NOTE) Elevated high sensitivity troponin I (hsTnI) values and significant  changes across serial measurements may suggest ACS but many other  chronic and  acute conditions are known to elevate hsTnI results.  Refer to the "Links" section for chest pain algorithms and additional  guidance. Performed at [facility],  [place] [address] [place], [place] [address]   Surgical PCR screen     Status: None   Collection Time: 01/08/23  9:38 PM   Specimen: Nasal Mucosa; Nasal Swab  Result Value Ref Range   MRSA, PCR NEGATIVE NEGATIVE   Staphylococcus aureus NEGATIVE NEGATIVE    Comment: (NOTE) The Xpert SA Assay (FDA approved for NASAL specimens in patients 1 years of age and older), is one component of a comprehensive surveillance program. It is not intended to diagnose infection nor to guide or monitor treatment. Performed at [facility], [place] [address], [place], [place] [address]   Type and screen     Status: None   Collection Time: 01/08/23 10:31 PM  Result Value Ref Range   ABO/RH(D) O POS    Antibody Screen NEG    Sample Expiration      01/11/2023,2359 Performed at [facility], [place] [address], [place], [place] [address]   Prealbumin     Status: Abnormal   Collection Time: 01/09/23 12:20 AM  Result Value Ref Range   Prealbumin 17 (L) 18 - 38 mg/dL    Comment: Performed at [place] [address] [place], [place] [address]  Troponin I (High Sensitivity)     Status: Abnormal   Collection Time: 01/09/23 12:20 AM  Result Value Ref Range   Troponin I (High Sensitivity) 56 (H) <18 ng/L    Comment: (NOTE) Elevated high sensitivity troponin I (hsTnI) values and significant  changes across serial measurements may suggest ACS but many other  chronic and acute conditions are known to elevate hsTnI results.  Refer to the "Links" section for chest pain algorithms and additional  guidance. Performed at [facility], [place] [address], [place], [place] [address]   Urinalysis, Complete w Microscopic -Urine, Clean Catch     Status: None   Collection Time: 01/09/23  6:48 AM  Result Value Ref Range   Color, Urine YELLOW YELLOW   APPearance CLEAR CLEAR   Specific Gravity, Urine 1.025 1.005 - 1.030   pH 5.5 5.0 - 8.0   Glucose, UA NEGATIVE NEGATIVE mg/dL   Hgb urine  dipstick NEGATIVE NEGATIVE   Bilirubin Urine NEGATIVE NEGATIVE   Ketones, ur NEGATIVE NEGATIVE mg/dL   Protein, ur NEGATIVE NEGATIVE mg/dL   Nitrite NEGATIVE NEGATIVE   Leukocytes,Ua NEGATIVE NEGATIVE   Squamous Epithelial / HPF 0-5 0 - 5 /HPF   WBC, UA 0-5 0 - 5 WBC/hpf   RBC / HPF 0-5 0 - 5 RBC/hpf   Bacteria, UA NONE SEEN NONE SEEN   Mucus PRESENT    Hyaline Casts, UA PRESENT     Comment: Performed at [place] [address] [place], [place] [address]    CT KNEE LEFT WO CONTRAST  Result Date: 01/09/2023 CLINICAL DATA:  Knee surgical planning EXAM: CT OF THE LEFT KNEE WITHOUT CONTRAST TECHNIQUE: Multidetector CT imaging of the left knee was performed according to the standard protocol. Multiplanar CT image reconstructions were also generated. RADIATION DOSE REDUCTION: This exam was performed according to the departmental dose-optimization program which includes automated exposure control, adjustment of the mA and/or kV according to patient size and/or use of iterative reconstruction technique. COMPARISON:  Knee radiograph 01/08/2023 FINDINGS: Bones/Joint/Cartilage There is a comminuted and displaced distal femoral  metaphyseal fracture with intra-articular extension through the lateral trochlea, with impaction and posterior angulation. Diffuse osteopenia. There is a nondisplaced fracture of the anterior aspect of the lateral tibial plateau. There is a tiny fracture at the upper tip of the patella (series 4, image 54). There is a large lipohemarthrosis. Tiny area of avascular necrosis in the anterior aspect of the lateral femoral condyle (series 8, image 61, series 7, image 42). Ligaments Suboptimally assessed by CT. Muscles and Tendons No acute myotendinous abnormality by CT. Soft tissues Soft tissue swelling along the knee.  No focal fluid collection. IMPRESSION: Comminuted and displaced intra-articular distal femoral metaphyseal fracture with impaction and angulation. Nondisplaced  fracture of the anterior aspect of the lateral tibial plateau. Tiny fracture at the upper tip of the patella. Diffuse osteopenia. Tiny area of AVN in the anterior aspect of the lateral femoral condyle. Large lipohemarthrosis. Electronically Signed   By: Maurine Simmering M.D.   On: 01/09/2023 08:22   DG Chest Portable 1 View  Result Date: 01/08/2023 CLINICAL DATA:  Preoperative EXAM: PORTABLE CHEST 1 VIEW COMPARISON:  12/09/2020 FINDINGS: The heart size and mediastinal contours are within normal limits. Both lungs are clear. The visualized skeletal structures are unremarkable. IMPRESSION: No acute abnormality of the lungs in AP portable projection. Electronically Signed   By: Delanna Ahmadi M.D.   On: 01/08/2023 19:36   CT HEAD WO CONTRAST (5MM)  Result Date: 01/08/2023 CLINICAL DATA:  Fall EXAM: CT HEAD WITHOUT CONTRAST CT CERVICAL SPINE WITHOUT CONTRAST TECHNIQUE: Multidetector CT imaging of the head and cervical spine was performed following the standard protocol without intravenous contrast. Multiplanar CT image reconstructions of the cervical spine were also generated. RADIATION DOSE REDUCTION: This exam was performed according to the departmental dose-optimization program which includes automated exposure control, adjustment of the mA and/or kV according to patient size and/or use of iterative reconstruction technique. COMPARISON:  02/07/2022 FINDINGS: CT HEAD FINDINGS Brain: No evidence of acute infarction, hemorrhage, hydrocephalus, extra-axial collection or mass lesion/mass effect. Mild periventricular white matter hypodensity. Vascular: No hyperdense vessel or unexpected calcification. Skull: Normal. Negative for fracture or focal lesion. Sinuses/Orbits: No acute finding. Other: None. CT CERVICAL SPINE FINDINGS Alignment: Normal. Skull base and vertebrae: No acute fracture. No primary bone lesion or focal pathologic process. Soft tissues and spinal canal: No prevertebral fluid or swelling. No visible  canal hematoma. Disc levels: Moderate space height loss and osteophytosis from C3-C6. Upper chest: Negative. Other: None. IMPRESSION: 1. No acute intracranial pathology. Small-vessel white matter disease. 2. No fracture or static subluxation of the cervical spine. 3. Moderate multilevel cervical disc degenerative disease. Electronically Signed   By: Delanna Ahmadi M.D.   On: 01/08/2023 16:09   CT CERVICAL SPINE WO CONTRAST  Result Date: 01/08/2023 CLINICAL DATA:  Fall EXAM: CT HEAD WITHOUT CONTRAST CT CERVICAL SPINE WITHOUT CONTRAST TECHNIQUE: Multidetector CT imaging of the head and cervical spine was performed following the standard protocol without intravenous contrast. Multiplanar CT image reconstructions of the cervical spine were also generated. RADIATION DOSE REDUCTION: This exam was performed according to the departmental dose-optimization program which includes automated exposure control, adjustment of the mA and/or kV according to patient size and/or use of iterative reconstruction technique. COMPARISON:  02/07/2022 FINDINGS: CT HEAD FINDINGS Brain: No evidence of acute infarction, hemorrhage, hydrocephalus, extra-axial collection or mass lesion/mass effect. Mild periventricular white matter hypodensity. Vascular: No hyperdense vessel or unexpected calcification. Skull: Normal. Negative for fracture or focal lesion. Sinuses/Orbits: No acute finding. Other: None. CT CERVICAL  SPINE FINDINGS Alignment: Normal. Skull base and vertebrae: No acute fracture. No primary bone lesion or focal pathologic process. Soft tissues and spinal canal: No prevertebral fluid or swelling. No visible canal hematoma. Disc levels: Moderate space height loss and osteophytosis from C3-C6. Upper chest: Negative. Other: None. IMPRESSION: 1. No acute intracranial pathology. Small-vessel white matter disease. 2. No fracture or static subluxation of the cervical spine. 3. Moderate multilevel cervical disc degenerative disease.  Electronically Signed   By: Delanna Ahmadi M.D.   On: 01/08/2023 16:09   DG Knee Left Port  Result Date: 01/08/2023 CLINICAL DATA:  [place], left knee pain and swelling EXAM: PORTABLE LEFT KNEE - 1-2 VIEW COMPARISON:  02/07/2022 FINDINGS: Frontal, bilateral oblique, and lateral views of the left knee are obtained. Bones are diffusely osteopenic. There is a comminuted intra-articular fracture involving the distal left femur. Fracture lines involve the bilateral femoral condyles, with impaction and valgus angulation at the fracture sites. There is no dislocation. Moderate lipohemarthrosis. Diffuse soft tissue swelling. IMPRESSION: 1. Comminuted intra-articular fractures involving the bilateral femoral condyles, with impaction and valgus angulation at the fracture site. 2. Likely hemarthrosis. 3. Diffuse soft tissue swelling. 4. Marked osteopenia. Electronically Signed   By: Randa Ngo M.D.   On: 01/08/2023 15:46    Review of Systems  HENT:  Negative for ear discharge, ear pain, hearing loss and tinnitus.   Eyes:  Negative for photophobia and pain.  Respiratory:  Negative for cough and shortness of breath.   Cardiovascular:  Negative for chest pain.  Gastrointestinal:  Negative for abdominal pain, nausea and vomiting.  Genitourinary:  Negative for dysuria, flank pain, frequency and urgency.  Musculoskeletal:  Positive for arthralgias (Left knee). Negative for back pain, myalgias and neck pain.  Neurological:  Negative for dizziness and headaches.  Hematological:  Does not bruise/bleed easily.  Psychiatric/Behavioral:  The patient is not nervous/anxious.    Blood pressure 129/60, pulse 95, temperature 98.3 F (36.8 C), temperature source Oral, resp. rate 16, height 5' 2.25" (1.581 m), weight 54.4 kg, SpO2 97 %. Physical Exam Constitutional:      General: She is not in acute distress.    Appearance: She is well-developed. She is not diaphoretic.  HENT:     Head: Normocephalic and atraumatic.   Eyes:     General: No scleral icterus.       Right eye: No discharge.        Left eye: No discharge.     Conjunctiva/sclera: Conjunctivae normal.  Cardiovascular:     Rate and Rhythm: Normal rate and regular rhythm.  Pulmonary:     Effort: Pulmonary effort is normal. No respiratory distress.  Musculoskeletal:     Cervical back: Normal range of motion.     Comments: LLE No traumatic wounds, ecchymosis, or rash  KI in place  No ankle effusion  Sens DPN, SPN, TN intact  Motor EHL, ext, flex, evers 5/5  DP 2+, PT 2+, No significant edema  Skin:    General: Skin is warm and dry.  Neurological:     Mental Status: She is alert.  Psychiatric:        Mood and Affect: Mood normal.        Behavior: Behavior normal.     Assessment/Plan: Left distal femur fx -- Plan ORIF today with Dr. Doreatha Martin. Please keep NPO. Multiple medical problems including hypertension, HLD, prediabetes hypothyroidism, rheumatoid arthritis, vitamin D deficiency, anemia, osteopenia, and aortic atherosclerosis -- per primary service    Legrand Como  Bard Herbert, PA-C Orthopedic Surgery [phone] 01/09/2023, 12:35 PM

## 2023-01-09 NOTE — Transfer of Care (Signed)
Immediate Anesthesia Transfer of Care Note  Patient: Dana [name]  Procedure(s) Performed: OPEN REDUCTION INTERNAL FIXATION (ORIF) DISTAL FEMUR FRACTURE (Left: Leg Lower)  Patient Location: PACU  Anesthesia Type:General  Level of Consciousness: drowsy and patient cooperative  Airway & Oxygen Therapy: Patient Spontanous Breathing and Patient connected to nasal cannula oxygen  Post-op Assessment: Report given to RN, Post -op Vital signs reviewed and stable, and Patient moving all extremities X 4  Post vital signs: Reviewed and stable  Last Vitals:  Vitals Value Taken Time  BP 155/78 01/09/23 1630  Temp    Pulse 95 01/09/23 1631  Resp 11 01/09/23 1631  SpO2 98 % 01/09/23 1631  Vitals shown include unvalidated device data.  Last Pain:  Vitals:   01/09/23 1257  TempSrc:   PainSc: 8       Patients Stated Pain Goal: 0 ([id number])  Complications:  Encounter Notable Events  Notable Event Outcome Phase Comment  Difficult to intubate - expected  Intraprocedure Filed from anesthesia note documentation.

## 2023-01-09 NOTE — Anesthesia Preprocedure Evaluation (Addendum)
Anesthesia Evaluation  Patient identified by MRN, date of birth, ID band Patient awake    Reviewed: Allergy & Precautions, NPO status , Patient's Chart, lab work & pertinent test results  History of Anesthesia Complications Negative for: history of anesthetic complications  Airway Mallampati: III  TM Distance: <3 FB Neck ROM: Limited    Dental  (+) Dental Advisory Given   Pulmonary neg shortness of breath, neg sleep apnea, neg COPD, neg recent URI, former smoker   Pulmonary exam normal breath sounds clear to auscultation       Cardiovascular hypertension, (-) angina (-) Past MI, (-) Cardiac Stents and (-) CABG (-) dysrhythmias  Rhythm:Regular Rate:Normal  HLD  Normal myoview 04/05/2019  TTE 04/05/2019: IMPRESSIONS     1. The left ventricle has normal systolic function with an ejection  fraction of 60-65%. The cavity size was normal. Left ventricular diastolic  Doppler parameters are indeterminate. See Findings. Indeterminate filling  pressures.   2. The right ventricle has normal systolic function. The cavity was  normal. There is no increase in right ventricular wall thickness. Right  ventricular systolic pressure is mildly elevated with an estimated  pressure of 39.1 mmHg.   3. Left atrial size was mildly dilated.   4. The aortic valve is tricuspid. Moderate sclerosis of the aortic valve.   5. The aortic root and ascending aorta are normal in size and structure.   6. The inferior vena cava was dilated in size with <50% respiratory  variability.     Neuro/Psych  Headaches, neg Seizures Right hand essential tremor    GI/Hepatic hiatal hernia, PUD,GERD  ,,Diverticulosis    Endo/Other  Hypothyroidism    Renal/GU      Musculoskeletal  (+) Arthritis , Rheumatoid disorders,  Osteoporosis    Abdominal  (+) - obese  Peds  Hematology  (+) Blood dyscrasia, anemia   Anesthesia Other Findings    Reproductive/Obstetrics                             Anesthesia Physical Anesthesia Plan  ASA: 3  Anesthesia Plan: General   Post-op Pain Management:    Induction: Intravenous  PONV Risk Score and Plan: 3 and Ondansetron, Dexamethasone and Treatment may vary due to age or medical condition  Airway Management Planned: Oral ETT  Additional Equipment:   Intra-op Plan:   Post-operative Plan: Extubation in OR  Informed Consent: I have reviewed the patients History and Physical, chart, labs and discussed the procedure including the risks, benefits and alternatives for the proposed anesthesia with the patient or authorized representative who has indicated his/her understanding and acceptance.     Dental advisory given  Plan Discussed with: CRNA and Anesthesiologist  Anesthesia Plan Comments: (Risks of general anesthesia discussed including, but not limited to, sore throat, hoarse voice, chipped/damaged teeth, injury to vocal cords, nausea and vomiting, allergic reactions, lung infection, heart attack, stroke, and death. All questions answered. )        Anesthesia Quick Evaluation

## 2023-01-09 NOTE — Progress Notes (Signed)
Report called to short stay. Spoke with Ciara,RN who verbalized understanding of report and had no further questions. Consent signed and in chart. All undergarments have been removed. Inpatient CHG bath has been done. Patient in room waiting for transport staff to pick her up.

## 2023-01-09 NOTE — Progress Notes (Signed)
Dinner tray ordered.

## 2023-01-09 NOTE — Progress Notes (Signed)
Pt down in CT

## 2023-01-09 NOTE — Anesthesia Procedure Notes (Signed)
Procedure Name: Intubation Date/Time: 01/09/2023 3:24 PM  Performed by: Darletta Moll, CRNAPre-anesthesia Checklist: Patient identified, Emergency Drugs available, Suction available and Patient being monitored Patient Re-evaluated:Patient Re-evaluated prior to induction Oxygen Delivery Method: Circle system utilized Preoxygenation: Pre-oxygenation with 100% oxygen Induction Type: IV induction Ventilation: Mask ventilation without difficulty and Oral airway inserted - appropriate to patient size Laryngoscope Size: Glidescope and 3 Grade View: Grade I Tube type: Oral Tube size: 7.0 mm Number of attempts: 1 Airway Equipment and Method: Stylet and Oral airway Placement Confirmation: ETT inserted through vocal cords under direct vision, positive ETCO2 and breath sounds checked- equal and bilateral Secured at: 20 cm Tube secured with: Tape Dental Injury: Teeth and Oropharynx as per pre-operative assessment  Difficulty Due To: Difficulty was anticipated and Difficult Airway- due to anterior larynx

## 2023-01-09 NOTE — Progress Notes (Signed)
Patient transported down to short stay via bed by volunteer staff.

## 2023-01-09 NOTE — Progress Notes (Signed)
Initial Nutrition Assessment  DOCUMENTATION CODES:   Not applicable  INTERVENTION:   Multivitamin w/ minerals daily Recommend Ensure Enlive po BID, each supplement provides 350 kcal and 20 grams of protein. Recommend a regular diet post-op.   NUTRITION DIAGNOSIS:   Increased nutrient needs related to post-op healing as evidenced by estimated needs.  GOAL:   Patient will meet greater than or equal to 90% of their needs  MONITOR:   PO intake, Supplement acceptance, Labs, I & O's  REASON FOR ASSESSMENT:   Consult Assessment of nutrition requirement/status  ASSESSMENT:   82 y.o. female presented to the ED after a mechanical fall. PMH includes HTN, HLD, Rheumatoid arthritis, and chronic anemia. Pt admitted with L tibial fracture.   Pt unavailable at time of RD visit. Pt in OR. RD to order pt supplements for post-op.  Medications reviewed. Labs reviewed.  NUTRITION - FOCUSED PHYSICAL EXAM:  Deferred to follow-up.   Diet Order:   Diet Order             Diet NPO time specified Except for: Sips with Meds  Diet effective now                   EDUCATION NEEDS:   Not appropriate for education at this time  Skin:  Skin Assessment: Reviewed RN Assessment  Last BM:  2/18  Height:   Ht Readings from Last 1 Encounters:  01/09/23 5' 2.25" (1.581 m)    Weight:   Wt Readings from Last 1 Encounters:  01/09/23 54.4 kg    Ideal Body Weight:  50 kg  BMI:  Body mass index is 21.77 kg/m.  Estimated Nutritional Needs:  Kcal:  1500-1700 Protein:  75-95 grams Fluid:  >/= 1.5 L   Hermina Barters RD, LDN Clinical Dietitian See [facility] for contact information.

## 2023-01-09 NOTE — Op Note (Signed)
Orthopaedic Surgery Operative Note (CSN: [id number] ) Date of Surgery: 01/09/2023  Admit Date: 01/08/2023   Diagnoses: Pre-Op Diagnoses: Left intra-articular supracondylar distal femur fracture  Post-Op Diagnosis: Same  Procedures: CPT 27513-Open reduction internal fixation of left supracondylar distal femur fracture  Surgeons : Primary: Shona Needles, MD  Assistant: Patrecia Pace, PA-C  Location: OR 3   Anesthesia: General   Antibiotics: Ancef 2g preop with 1 gm vancomycin powder   Tourniquet time: None    Estimated Blood Loss: 123XX123 mL  Complications:(1) Difficult to intubate - expected  Comments: Filed from anesthesia note documentation.   Specimens:None  Implants: Implant Name Type Inv. Item Serial No. Manufacturer Lot No. LRB No. Used Action  PLATE DIST FEM 579FGE - [id number] Plate PLATE DIST FEM 579FGE  [employer](ORTH,TRAU,BIO,SG) ON TRAY Left 1 Implanted  CAP LOCK NCB - [id number] Cap CAP LOCK NCB  [employer](ORTH,TRAU,BIO,SG) ON TRAY Left 7 Implanted  SCREW 5.0 80MM - [id number] Screw SCREW 5.0 80MM  [employer](ORTH,TRAU,BIO,SG) ON TRAY Left 1 Implanted  SCREW NCB 5.0X85MM - [id number] Screw SCREW NCB 5.0X85MM  [employer](ORTH,TRAU,BIO,SG) ON TRAY Left 4 Implanted  SCREW CORTICAL NCB 5.0X40 - [id number] Screw SCREW CORTICAL NCB 5.0X40  [employer](ORTH,TRAU,BIO,SG) ON TRAY Left 2 Implanted  SCREW NCB 4.0MX38M - [id number] Screw SCREW NCB 4.0MX38M  [employer](ORTH,TRAU,BIO,SG) ON TRAY Left 1 Implanted     Indications for Surgery: 82 year old female who sustained a ground-level fall and had a left supracondylar distal femur fracture with intra-articular extension.  Due to the unstable nature of her injury I recommend proceeding with open reduction internal fixation.  Risks and benefits were discussed with the patient and her daughter.  Risks include but not limited to bleeding, infection, malunion, nonunion, hardware failure, hardware irritation, nerve or blood  vessel injury, DVT, even the possibility anesthetic complications.  They agreed to proceed with surgery and consent was obtained.  Operative Findings: Open reduction internal fixation of left supracondylar distal femur fracture with intra-articular extension using [employer] NCB distal femoral locking plate  Procedure: The patient was identified in the preoperative holding area. Consent was confirmed with the patient and their family and all questions were answered. The operative extremity was marked after confirmation with the patient. she was then brought back to the operating room by our anesthesia colleagues.  She was placed under general anesthetic and carefully transferred over to radiolucent flattop table.  A bump was placed under her operative hip.  The left lower extremity was prepped and draped in usual sterile fashion.  A timeout was performed to verify the patient, the procedure, and the extremity.  Preoperative antibiotics were dosed.  The hip and knee were flexed over a triangle and fluoroscopic imaging showed the unstable nature of her injury.  A lateral parapatellar approach was made and carried down through skin and subcutaneous tissue.  Incised through the IT band and through the capsule to enter the knee joint.  I exposed the lateral condyle distal femur for placement of the plate.  I then cleaned out the hematoma of the intra-articular split between the lateral and medial condyle.  I then manipulated the condyles back into reduction and used a reduction tenaculum to hold him in position.  I then provisionally held the condylar reduction using 2.0 mm K wires and brought them out the medial side and placed them flush to the lateral condyle.  Traction was applied by my assistant and the metaphyseal region was reduced and then I proceeded to place a  12 hole [employer] NCB distal femoral locking plate attached to the targeting arm and slid this submuscularly along the lateral cortex of  the femur.  I placed a 2.0 mm K wire distally to hold the position of the bone and then a 3.3 mm drill bit proximally to align the proximal portion of the plate.  I confirmed positioning of the plate I then placed a 5.0 millimeter screws distally to bring the plate flush to bone.  I then percutaneously placed 5.0 millimeter screws in the femoral shaft.  I then removed the 3.3 mm drill bit proximally and placed a 4.0 millimeter screw.  5.0 millimeter screws had locking caps placed and I removed the targeting arm.  I then proceeded to place 3 more distal screws for total of 5.  I then placed locking caps on all of the distal screws.  I removed all the K wires and obtained final fluoroscopic imaging.  The incisions were copiously irrigated.  A gram of vancomycin powder was placed into the incision.  Layered closure of 0 Vicryl, 2-0 Vicryl and 3-0 Monocryl with Dermabond was used to close the skin.  Sterile dressings were applied.  The patient was awoken from anesthesia and taken to the PACU in stable condition.  Post Op Plan/Instructions: The patient be touchdown weightbearing to the left lower extremity.  She will receive postoperative attics.  She will receive Lovenox for DVT prophylaxis and discharged on a oral DOAC.  We will have her mobilize with physical and Occupational Therapy.  I was present and performed the entire surgery.  Patrecia Pace, PA-C did assist me throughout the case. An assistant was necessary given the difficulty in approach, maintenance of reduction and ability to instrument the fracture.   Katha Hamming, MD [facility]

## 2023-01-10 ENCOUNTER — Encounter (HOSPITAL_COMMUNITY): Payer: Self-pay | Admitting: Student

## 2023-01-10 DIAGNOSIS — S728X2A Other fracture of left femur, initial encounter for closed fracture: Secondary | ICD-10-CM | POA: Diagnosis not present

## 2023-01-10 LAB — CBC
HCT: 23.9 % — ABNORMAL LOW (ref 36.0–46.0)
Hemoglobin: 8.1 g/dL — ABNORMAL LOW (ref 12.0–15.0)
MCH: 33.5 pg (ref 26.0–34.0)
MCHC: 33.9 g/dL (ref 30.0–36.0)
MCV: 98.8 fL (ref 80.0–100.0)
Platelets: 147 10*3/uL — ABNORMAL LOW (ref 150–400)
RBC: 2.42 MIL/uL — ABNORMAL LOW (ref 3.87–5.11)
RDW: 15.4 % (ref 11.5–15.5)
WBC: 7.9 10*3/uL (ref 4.0–10.5)
nRBC: 0 % (ref 0.0–0.2)

## 2023-01-10 LAB — VITAMIN D 25 HYDROXY (VIT D DEFICIENCY, FRACTURES): Vit D, 25-Hydroxy: 76.46 ng/mL (ref 30–100)

## 2023-01-10 LAB — BASIC METABOLIC PANEL
Anion gap: 9 (ref 5–15)
BUN: 15 mg/dL (ref 8–23)
CO2: 27 mmol/L (ref 22–32)
Calcium: 8 mg/dL — ABNORMAL LOW (ref 8.9–10.3)
Chloride: 101 mmol/L (ref 98–111)
Creatinine, Ser: 0.82 mg/dL (ref 0.44–1.00)
GFR, Estimated: >60 mL/min (ref >60)
Glucose, Bld: 149 mg/dL — ABNORMAL HIGH (ref 70–99)
Potassium: 3.8 mmol/L (ref 3.5–5.1)
Sodium: 137 mmol/L (ref 135–145)

## 2023-01-10 LAB — MAGNESIUM: Magnesium: 2.1 mg/dL (ref 1.7–2.4)

## 2023-01-10 MED ORDER — LOPERAMIDE HCL 2 MG PO CAPS
2.0000 mg | ORAL_CAPSULE | ORAL | Status: DC | PRN
  Administered 2023-01-10: 2 mg via ORAL
  Filled 2023-01-10: qty 1

## 2023-01-10 MED ORDER — FLUTICASONE PROPIONATE 50 MCG/ACT NA SUSP
2.0000 | Freq: Every day | NASAL | Status: DC
  Administered 2023-01-10 – 2023-01-13 (×4): 2 via NASAL
  Filled 2023-01-10 (×2): qty 16

## 2023-01-10 MED ORDER — LORATADINE 10 MG PO TABS
10.0000 mg | ORAL_TABLET | Freq: Every day | ORAL | Status: DC
  Administered 2023-01-10 – 2023-01-13 (×4): 10 mg via ORAL
  Filled 2023-01-10 (×4): qty 1

## 2023-01-10 NOTE — Progress Notes (Signed)
PROGRESS NOTE    Brittanny Limbaugh  [id number] DOB: [birth year] DOA: 01/08/2023 PCP: Unk Pinto, MD    Brief Narrative:   Geniene Escobedo is a 82 y.o. female with past medical history significant for HTN, HLD, prediabetes, vitamin D deficiency, rheumatoid arthritis, anemia, osteopenia, aortic atherosclerosis who presented to [facility] ED on 2/18 with complaints of left knee pain.  [name]  Fall was witnessed by her daughter.  In the ED, WBC 11.0, hemoglobin 10.9, platelets 200.  Sodium 141, potassium 3.9, chloride 103, CO2 28, glucose 96, BUN 12, creatinine 0.67.  AST 26, ALT 14, total bilirubin 0.8.  High sensitive troponin 45>56.  INR 1.1.  TSH 4.148.  Urinalysis unrevealing.  CT head/C-spine with no acute intracranial pathology, small vessel white matter disease, no fracture or subluxation of the C-spine.  Chest x-ray with no acute abnormality.  Left knee x-ray with comminuted intra-articular fractures involving the bilateral femoral condyles with impaction and varus angulation, likely hemarthrosis with diffuse soft tissue swelling.  Orthopedics was consulted.  TRH was consulted for further evaluation management of left femur fracture fracture.  Assessment & Plan:   Left distal femur fracture s/p ORIF Patient presenting to ED with severe left lower extremity pain with inability to ambulate following fall at home.  Imaging notable for comminuted displaced intra-articular distal femoral fracture with impaction and angulation, nondisplaced fracture anterior aspect lateral tibial plateau, tiny fracture upper tip of patella, diffuse osteopenia, tiny area AVN anterior aspect of the lateral femoral condyle.  Orthopedics was consulted and patient underwent ORIF left supracondylar distal femur fracture by Dr. Doreatha Martin on 01/09/2023 -- Orthopedics following,  appreciate assistance -- TDWB LLE -- Norco q6h PRN moderate pain -- Robaxin every 6 hours as needed muscle spasms -- Dilaudid 0.5 mg IV q2h PRN severe pain -- Currently on Lovenox for DVT prophylaxis, plan Eliquis 2.5 mg twice daily x 30 days on discharge -- Vitamin D 25 hydroxy level: Pending -- PT/OT evaluation: Pending -- Outpatient follow-up with orthopedics, Dr. Doreatha Martin 2 weeks for postoperative check, x-rays  Elevated troponin High sensitive troponin elevated 45 followed by 56.  EKG with NSR, no concerning dynamic changes.  Patient denies chest pain.  TTE with LVEF 65-70%, no LV regional wall motion normalities, mild LVH, LA moderately dilated, mild MR, mild aortic valve stenosis, IVC normal in size.  Etiology likely secondary to type II demand ischemia in the setting of femur fracture as above. -- Continue monitor on telemetry  Postoperative blood loss anemia Hemoglobin 10.9 on admission, trended down to 8.1 postoperatively.  Etiology likely secondary to acute blood loss anemia secondary to long bone fracture. -- Repeat CBC in a.m.  Essential hypertension -- Propranolol 80 mg p.o. twice daily   Hypothyroidism TSH 4.148, within normal limits. --Continue levothyroxine 100 mcg on S/T/W/T/S and 71mg on M/F  Rheumatoid arthritis On methotrexate, Plaquenil.  Hx tremor -- Diazepam 5 mg -- Propranolol   DVT prophylaxis: enoxaparin (LOVENOX) injection 40 mg Start: 01/10/23 0800 SCDs Start: 01/09/23 1722 SCDs Start: 01/08/23 2104    Code Status: Full Code Family Communication: No family present at bedside this morning  Disposition Plan:  Level of care: Telemetry Medical Status is: Inpatient Remains inpatient appropriate because: Pending surgical intervention for femur fracture    Consultants:  Orthopedics, Dr. HDoreatha Martin Procedures:  ORIF left supracondylar  distal femur fracture by Dr. Doreatha Martin; 01/09/2023  Antimicrobials:  None   Subjective: Patient seen examined  bedside, lying in bed.  Eating breakfast.  Reports some mild pain to the operative side; although much better than yesterday before her surgery.  Discussed with patient needs to see how she does with PT and OT and anticipate likely need of rehab placement.  No other specific complaints or concerns at this time.  Denies headache, no dizziness, no chest pain, no palpitations, no shortness of breath, no abdominal pain, no focal weakness, no fever/chills/night sweats, no nausea cefonicid diarrhea, no paresthesias.  No acute events overnight per nursing staff.  Objective: Vitals:   01/09/23 2141 01/10/23 0027 01/10/23 0457 01/10/23 0829  BP: (!) 134/57 (!) 129/51 (!) 131/58 (!) 139/55  Pulse: 94 87 87 (!) 101  Resp: 17 16 15 16  $ Temp: 98 F (36.7 C) 97.6 F (36.4 C) 98.2 F (36.8 C) 98.3 F (36.8 C)  TempSrc: Oral Oral Oral Oral  SpO2: 99% 97% 100% 99%  Weight:      Height:        Intake/Output Summary (Last 24 hours) at 01/10/2023 1111 Last data filed at 01/10/2023 1026 Gross per 24 hour  Intake 500 ml  Output 1350 ml  Net -850 ml   Filed Weights   01/08/23 1447 01/09/23 1241  Weight: 54.4 kg 54.4 kg    Examination:  Physical Exam: GEN: NAD, alert and oriented x 3, wd/wn, elderly in appearance HEENT: NCAT, PERRL, EOMI, sclera clear, MMM PULM: CTAB w/o wheezes/crackles, normal respiratory effort, on room air CV: RRR w/o M/G/R GI: abd soft, NTND, NABS, no R/G/M MSK: no peripheral edema, moves all extremities independently, surgical dressing noted in place, clean/dry/intact NEURO: No focal deficits PSYCH: normal mood/affect Integumentary: Surgical dressing as above otherwise no other concerning rashes/lesions/wounds noted on exposed skin surfaces.     Data Reviewed: I have personally reviewed following labs and imaging studies  CBC: Recent Labs  Lab 01/08/23 1628 01/09/23 1822 01/10/23 0012  WBC 11.0* 9.3 7.9  NEUTROABS 9.7*  --   --   HGB 10.9* 9.0* 8.1*  HCT 34.3*  27.4* 23.9*  MCV 101.2* 99.3 98.8  PLT 200 165 [id number]*   Basic Metabolic Panel: Recent Labs  Lab 01/08/23 1628 01/08/23 1722 01/09/23 1822 01/10/23 0012  NA 141  --   --  137  K 3.9  --   --  3.8  CL 103  --   --  101  CO2 28  --   --  27  GLUCOSE 96  --   --  149*  BUN 12  --   --  15  CREATININE 0.67  --  0.76 0.82  CALCIUM 8.4*  --   --  8.0*  MG  --  2.2  --  2.1  PHOS  --  2.8  --   --    GFR: Estimated Creatinine Clearance: 41.6 mL/min (by C-G formula based on SCr of 0.82 mg/dL). Liver Function Tests: Recent Labs  Lab 01/08/23 1722  AST 26  ALT 14  ALKPHOS 70  BILITOT 1.0  PROT 5.8*  ALBUMIN 3.4*   No results for input(s): "LIPASE", "AMYLASE" in the last 168 hours. No results for input(s): "AMMONIA" in the last 168 hours. Coagulation Profile: Recent Labs  Lab 01/08/23 1722  INR 1.1   Cardiac Enzymes: No results for input(s): "CKTOTAL", "CKMB", "CKMBINDEX", "TROPONINI" in the last 168 hours. BNP (last 3 results) No results for  input(s): "PROBNP" in the last 8760 hours. HbA1C: No results for input(s): "HGBA1C" in the last 72 hours. CBG: No results for input(s): "GLUCAP" in the last 168 hours. Lipid Profile: No results for input(s): "CHOL", "HDL", "LDLCALC", "TRIG", "CHOLHDL", "LDLDIRECT" in the last 72 hours. Thyroid Function Tests: Recent Labs    01/08/23 1722  TSH 4.148   Anemia Panel: No results for input(s): "VITAMINB12", "FOLATE", "FERRITIN", "TIBC", "IRON", "RETICCTPCT" in the last 72 hours. Sepsis Labs: No results for input(s): "PROCALCITON", "LATICACIDVEN" in the last 168 hours.  Recent Results (from the past 240 hour(s))  Surgical PCR screen     Status: None   Collection Time: 01/08/23  9:38 PM   Specimen: Nasal Mucosa; Nasal Swab  Result Value Ref Range Status   MRSA, PCR NEGATIVE NEGATIVE Final   Staphylococcus aureus NEGATIVE NEGATIVE Final    Comment: (NOTE) The Xpert SA Assay (FDA approved for NASAL specimens in patients 40 years  of age and older), is one component of a comprehensive surveillance program. It is not intended to diagnose infection nor to guide or monitor treatment. Performed at [facility], [place] [address], [place], [place] [address]          Radiology Studies: DG FEMUR MIN 2 VIEWS LEFT  Result Date: 01/09/2023 CLINICAL DATA:  Elective surgery. EXAM: LEFT FEMUR 2 VIEWS COMPARISON:  Preoperative imaging. FINDINGS: Six fluoroscopic spot views of the left femur obtained in the operating room. Lateral plate and multi screw fixation of distal femur fracture. Fluoroscopy time 37 seconds. Dose 1.61 mGy. IMPRESSION: Intraoperative fluoroscopy during distal femur fracture ORIF. Electronically Signed   By: Keith Rake M.D.   On: 01/09/2023 17:31   DG Knee Left Port  Result Date: 01/09/2023 CLINICAL DATA:  Postoperative. EXAM: PORTABLE LEFT KNEE - 1-2 VIEW COMPARISON:  Left knee radiographs 01/08/2023, CT left knee 01/09/2023 FINDINGS: There is new lateral plate and screw fixation of the proximal femoral diaphysis through the femoral condyles, fixating the previously seen displaced comminuted lateral femoral condyle fracture. Note is again made that the fracture lines extend into a distal lateral femoral metaphysis and the medial and lateral physeal regions. There is improved alignment with mild approximate 3 mm cortical step-off at the junction of the distal lateral metaphysis and lateral femoral condyle. No evidence of hardware failure. Postoperative intra-articular and subcutaneous air. IMPRESSION: Status post ORIF of the previously seen comminuted and displaced lateral femoral condyle fracture. There is improved alignment. Electronically Signed   By: Yvonne Kendall M.D.   On: 01/09/2023 17:10   DG C-Arm 1-60 Min-No Report  Result Date: 01/09/2023 Fluoroscopy was utilized by the requesting physician.  No radiographic interpretation.   ECHOCARDIOGRAM COMPLETE  Result Date: 01/09/2023     ECHOCARDIOGRAM REPORT   Patient Name:   [facility] Date of Exam: 01/09/2023 Medical Rec #:  [id number]           Height:       62.2 in Accession #:    [id number]          Weight:       120.0 lb Date of Birth:  1941-04-04           BSA:          1.543 m Patient Age:    8 years            BP:           122/64 mmHg Patient Gender: F  HR:           90 bpm. Exam Location:  Inpatient Procedure: 2D Echo, Cardiac Doppler and Color Doppler Indications:     Elevated Troponin  History:         Patient has prior history of Echocardiogram examinations, most                  recent 04/05/2019. Risk Factors:Hypertension, Former Smoker and                  Dyslipidemia.  Sonographer:     Wilkie Aye RVT RCS Referring Phys:  [place] Diagnosing Phys: Glori Bickers MD  Sonographer Comments: Unable to reposition patient due to broken leg. IMPRESSIONS  1. Left ventricular ejection fraction, by estimation, is 65 to 70%. The left ventricle has normal function. The left ventricle has no regional wall motion abnormalities. There is mild left ventricular hypertrophy. Left ventricular diastolic parameters were normal.  2. Right ventricular systolic function is normal. The right ventricular size is normal.  3. Left atrial size was moderately dilated.  4. The mitral valve is normal in structure. Mild mitral valve regurgitation. No evidence of mitral stenosis.  5. Aortic valve is moderately thickened and appears functionally bicuspid. The aortic valve is abnormal. There is moderate calcification of the aortic valve. Aortic valve regurgitation is not visualized. Mild aortic valve stenosis. Aortic valve area, by  VTI measures 2.28 cm. Aortic valve mean gradient measures 8.5 mmHg. Aortic valve Vmax measures 2.04 m/s.  6. The inferior vena cava is normal in size with greater than 50% respiratory variability, suggesting right atrial pressure of 3 mmHg. Comparison(s): EF 60-65. FINDINGS  Left Ventricle: Left  ventricular ejection fraction, by estimation, is 65 to 70%. The left ventricle has normal function. The left ventricle has no regional wall motion abnormalities. The left ventricular internal cavity size was normal in size. There is  mild left ventricular hypertrophy. Left ventricular diastolic parameters were normal. Right Ventricle: The right ventricular size is normal. No increase in right ventricular wall thickness. Right ventricular systolic function is normal. Left Atrium: Left atrial size was moderately dilated. Right Atrium: Right atrial size was normal in size. Pericardium: There is no evidence of pericardial effusion. Mitral Valve: The mitral valve is normal in structure. Mild mitral valve regurgitation. No evidence of mitral valve stenosis. Tricuspid Valve: The tricuspid valve is normal in structure. Tricuspid valve regurgitation is mild . No evidence of tricuspid stenosis. Aortic Valve: Aortic valve is moderately thickened and appears functionally bicuspid. The aortic valve is abnormal. There is moderate calcification of the aortic valve. Aortic valve regurgitation is not visualized. Mild aortic stenosis is present. Aortic  valve mean gradient measures 8.5 mmHg. Aortic valve peak gradient measures 16.6 mmHg. Aortic valve area, by VTI measures 2.28 cm. Pulmonic Valve: The pulmonic valve was grossly normal. Pulmonic valve regurgitation is trivial. No evidence of pulmonic stenosis. Aorta: The aortic root is normal in size and structure. Venous: The inferior vena cava is normal in size with greater than 50% respiratory variability, suggesting right atrial pressure of 3 mmHg. IAS/Shunts: No atrial level shunt detected by color flow Doppler.  LEFT VENTRICLE PLAX 2D LVIDd:         3.50 cm   Diastology LVIDs:         2.00 cm   LV e' medial:    7.56 cm/s LV PW:         1.10 cm   LV E/e' medial:  15.3 LV IVS:        1.30 cm   LV e' lateral:   7.68 cm/s LVOT diam:     2.00 cm   LV E/e' lateral: 15.1 LV SV:          79 LV SV Index:   51 LVOT Area:     3.14 cm  RIGHT VENTRICLE             IVC RV Basal diam:  3.40 cm     IVC diam: 1.90 cm RV Mid diam:    2.50 cm RV S prime:     17.40 cm/s TAPSE (M-mode): 2.2 cm LEFT ATRIUM             Index        RIGHT ATRIUM           Index LA diam:        3.10 cm 2.01 cm/m   RA Area:     10.60 cm LA Vol (A2C):   62.2 ml 40.31 ml/m  RA Volume:   21.30 ml  13.81 ml/m LA Vol (A4C):   80.4 ml 52.11 ml/m LA Biplane Vol: 69.1 ml 44.79 ml/m  AORTIC VALVE                     PULMONIC VALVE AV Area (Vmax):    2.37 cm      PV Vmax:       1.28 m/s AV Area (Vmean):   2.34 cm      PV Peak grad:  6.6 mmHg AV Area (VTI):     2.28 cm AV Vmax:           204.00 cm/s AV Vmean:          138.000 cm/s AV VTI:            0.347 m AV Peak Grad:      16.6 mmHg AV Mean Grad:      8.5 mmHg LVOT Vmax:         154.00 cm/s LVOT Vmean:        103.000 cm/s LVOT VTI:          0.252 m LVOT/AV VTI ratio: 0.73  AORTA Ao Root diam: 3.00 cm MITRAL VALVE                TRICUSPID VALVE MV Area (PHT): 3.28 cm     TR Peak grad:   34.3 mmHg MV Decel Time: 231 msec     TR Vmax:        293.00 cm/s MV E velocity: 116.00 cm/s MV A velocity: 103.00 cm/s  SHUNTS MV E/A ratio:  1.13         Systemic VTI:  0.25 m                             Systemic Diam: 2.00 cm Glori Bickers MD Electronically signed by Glori Bickers MD Signature Date/Time: 01/09/2023/2:01:20 PM    Final (Updated)    CT KNEE LEFT WO CONTRAST  Result Date: 01/09/2023 CLINICAL DATA:  Knee surgical planning EXAM: CT OF THE LEFT KNEE WITHOUT CONTRAST TECHNIQUE: Multidetector CT imaging of the left knee was performed according to the standard protocol. Multiplanar CT image reconstructions were also generated. RADIATION DOSE REDUCTION: This exam was performed according to the departmental dose-optimization program which includes automated exposure control, adjustment of the mA and/or kV according to patient size  and/or use of iterative reconstruction technique.  COMPARISON:  Knee radiograph 01/08/2023 FINDINGS: Bones/Joint/Cartilage There is a comminuted and displaced distal femoral metaphyseal fracture with intra-articular extension through the lateral trochlea, with impaction and posterior angulation. Diffuse osteopenia. There is a nondisplaced fracture of the anterior aspect of the lateral tibial plateau. There is a tiny fracture at the upper tip of the patella (series 4, image 54). There is a large lipohemarthrosis. Tiny area of avascular necrosis in the anterior aspect of the lateral femoral condyle (series 8, image 61, series 7, image 42). Ligaments Suboptimally assessed by CT. Muscles and Tendons No acute myotendinous abnormality by CT. Soft tissues Soft tissue swelling along the knee.  No focal fluid collection. IMPRESSION: Comminuted and displaced intra-articular distal femoral metaphyseal fracture with impaction and angulation. Nondisplaced fracture of the anterior aspect of the lateral tibial plateau. Tiny fracture at the upper tip of the patella. Diffuse osteopenia. Tiny area of AVN in the anterior aspect of the lateral femoral condyle. Large lipohemarthrosis. Electronically Signed   By: Maurine Simmering M.D.   On: 01/09/2023 08:22   DG Chest Portable 1 View  Result Date: 01/08/2023 CLINICAL DATA:  Preoperative EXAM: PORTABLE CHEST 1 VIEW COMPARISON:  12/09/2020 FINDINGS: The heart size and mediastinal contours are within normal limits. Both lungs are clear. The visualized skeletal structures are unremarkable. IMPRESSION: No acute abnormality of the lungs in AP portable projection. Electronically Signed   By: Delanna Ahmadi M.D.   On: 01/08/2023 19:36   CT HEAD WO CONTRAST (5MM)  Result Date: 01/08/2023 CLINICAL DATA:  Fall EXAM: CT HEAD WITHOUT CONTRAST CT CERVICAL SPINE WITHOUT CONTRAST TECHNIQUE: Multidetector CT imaging of the head and cervical spine was performed following the standard protocol without intravenous contrast. Multiplanar CT image  reconstructions of the cervical spine were also generated. RADIATION DOSE REDUCTION: This exam was performed according to the departmental dose-optimization program which includes automated exposure control, adjustment of the mA and/or kV according to patient size and/or use of iterative reconstruction technique. COMPARISON:  02/07/2022 FINDINGS: CT HEAD FINDINGS Brain: No evidence of acute infarction, hemorrhage, hydrocephalus, extra-axial collection or mass lesion/mass effect. Mild periventricular white matter hypodensity. Vascular: No hyperdense vessel or unexpected calcification. Skull: Normal. Negative for fracture or focal lesion. Sinuses/Orbits: No acute finding. Other: None. CT CERVICAL SPINE FINDINGS Alignment: Normal. Skull base and vertebrae: No acute fracture. No primary bone lesion or focal pathologic process. Soft tissues and spinal canal: No prevertebral fluid or swelling. No visible canal hematoma. Disc levels: Moderate space height loss and osteophytosis from C3-C6. Upper chest: Negative. Other: None. IMPRESSION: 1. No acute intracranial pathology. Small-vessel white matter disease. 2. No fracture or static subluxation of the cervical spine. 3. Moderate multilevel cervical disc degenerative disease. Electronically Signed   By: Delanna Ahmadi M.D.   On: 01/08/2023 16:09   CT CERVICAL SPINE WO CONTRAST  Result Date: 01/08/2023 CLINICAL DATA:  Fall EXAM: CT HEAD WITHOUT CONTRAST CT CERVICAL SPINE WITHOUT CONTRAST TECHNIQUE: Multidetector CT imaging of the head and cervical spine was performed following the standard protocol without intravenous contrast. Multiplanar CT image reconstructions of the cervical spine were also generated. RADIATION DOSE REDUCTION: This exam was performed according to the departmental dose-optimization program which includes automated exposure control, adjustment of the mA and/or kV according to patient size and/or use of iterative reconstruction technique. COMPARISON:   02/07/2022 FINDINGS: CT HEAD FINDINGS Brain: No evidence of acute infarction, hemorrhage, hydrocephalus, extra-axial collection or mass lesion/mass effect. Mild periventricular white matter hypodensity. Vascular:  No hyperdense vessel or unexpected calcification. Skull: Normal. Negative for fracture or focal lesion. Sinuses/Orbits: No acute finding. Other: None. CT CERVICAL SPINE FINDINGS Alignment: Normal. Skull base and vertebrae: No acute fracture. No primary bone lesion or focal pathologic process. Soft tissues and spinal canal: No prevertebral fluid or swelling. No visible canal hematoma. Disc levels: Moderate space height loss and osteophytosis from C3-C6. Upper chest: Negative. Other: None. IMPRESSION: 1. No acute intracranial pathology. Small-vessel white matter disease. 2. No fracture or static subluxation of the cervical spine. 3. Moderate multilevel cervical disc degenerative disease. Electronically Signed   By: Delanna Ahmadi M.D.   On: 01/08/2023 16:09   DG Knee Left Port  Result Date: 01/08/2023 CLINICAL DATA:  [place], left knee pain and swelling EXAM: PORTABLE LEFT KNEE - 1-2 VIEW COMPARISON:  02/07/2022 FINDINGS: Frontal, bilateral oblique, and lateral views of the left knee are obtained. Bones are diffusely osteopenic. There is a comminuted intra-articular fracture involving the distal left femur. Fracture lines involve the bilateral femoral condyles, with impaction and valgus angulation at the fracture sites. There is no dislocation. Moderate lipohemarthrosis. Diffuse soft tissue swelling. IMPRESSION: 1. Comminuted intra-articular fractures involving the bilateral femoral condyles, with impaction and valgus angulation at the fracture site. 2. Likely hemarthrosis. 3. Diffuse soft tissue swelling. 4. Marked osteopenia. Electronically Signed   By: Randa Ngo M.D.   On: 01/08/2023 15:46        Scheduled Meds:  docusate sodium  100 mg Oral BID   enoxaparin (LOVENOX) injection  40 mg  Subcutaneous Q24H   fluticasone  2 spray Each Nare Daily   levothyroxine  100 mcg Oral Once per day on Sun Tue Wed Thu Sat   levothyroxine  50 mcg Oral Once per day on Mon Fri   loratadine  10 mg Oral Daily   montelukast  10 mg Oral QHS   multivitamin with minerals  1 tablet Oral Daily   mupirocin ointment  1 Application Nasal BID   propranolol  80 mg Oral BID   Continuous Infusions:  sodium chloride 50 mL/hr at 01/09/23 1745    ceFAZolin (ANCEF) IV 2 g (01/10/23 0600)   methocarbamol (ROBAXIN) IV       LOS: 2 days    Time spent: 48 minutes spent on chart review, discussion with nursing staff, consultants, updating family and interview/physical exam; more than 50% of that time was spent in counseling and/or coordination of care.    Lorayne Getchell J [place] DO [facility] Available via Epic secure chat 7am-7pm After these hours, please refer to coverage provider listed on [email or web address] 01/10/2023, 11:11 AM

## 2023-01-10 NOTE — Evaluation (Signed)
Physical Therapy Evaluation Patient Details Name: Dana [name] MRN: [id number] DOB: Nov 15, 1941 Today's Date: 01/10/2023  History of Present Illness  Dana [name] is a 82 y.o. female  who presented to [facility] ED on 2/18 with complaints of left knee pain.  Patient was at home sitting in a chair in which she slid down and hit the back of her head and fell causing severe knee pain with inability to ambulate; sustained a L tibial plateau fx, now s/p ORIF, TWB, no knee ROM restrictions; with past medical history significant for HTN, HLD, prediabetes, vitamin D deficiency, rheumatoid arthritis, anemia, osteopenia, aortic atherosclerosis  Clinical Impression   Pt admitted with above diagnosis. Lives at home alone, in a single-level home with a few steps to enter; Prior to admission, pt was able to manage independently, using a cane for ambulation, and occasionally a RW; Presents to PT with decr functional mobility, weight bearing restrictions LLE, functional dependencies;  Today, she needed MOd assist for bed mobility, 2 person mod assist for sit to stand, and min/minguard assist for heel-toe pivots on R foot for bed to chair transfer; Pt currently with functional limitations due to the deficits listed below (see PT Problem List). Pt will benefit from skilled PT to increase their independence and safety with mobility to allow discharge to the venue listed below.          Recommendations for follow up therapy are one component of a multi-disciplinary discharge planning process, led by the attending physician.  Recommendations may be updated based on patient status, additional functional criteria and insurance authorization.  Follow Up Recommendations Skilled nursing-short term rehab (<3 hours/day) Can patient physically be transported by private vehicle:  (perhaps soon)    Assistance Recommended at Discharge Intermittent Supervision/Assistance  Patient can return home with the following  A little  help with walking and/or transfers;A lot of help with bathing/dressing/bathroom;Assistance with cooking/housework;Assist for transportation;Help with stairs or ramp for entrance    Equipment Recommendations None recommended by PT  Recommendations for Other Services       Functional Status Assessment Patient has had a recent decline in their functional status and demonstrates the ability to make significant improvements in function in a reasonable and predictable amount of time.     Precautions / Restrictions Precautions Precautions: Fall Restrictions LLE Weight Bearing: Touchdown weight bearing      Mobility  Bed Mobility Overal bed mobility: Needs Assistance Bed Mobility: Supine to Sit     Supine to sit: Mod assist, +2 for safety/equipment     General bed mobility comments: Cues for technqiue; good haldp-bridge to EOB with RLE; Light mod assist to help elevate trunk and square off hips at EOB    Transfers Overall transfer level: Needs assistance Equipment used: Rolling walker (2 wheels) Transfers: Sit to/from Stand, Bed to chair/wheelchair/BSC Sit to Stand: Mod assist Stand pivot transfers: Min assist         General transfer comment: Mod assist to rise; cues for hand placement; very nice ability to keep weight off of her LLE and heel-toe pivot on her RLE    Ambulation/Gait                  Stairs            Wheelchair Mobility    Modified Rankin (Stroke Patients Only)       Balance Overall balance assessment: Needs assistance   Sitting balance-Leahy Scale: Fair       Standing balance-Leahy  Scale: Poor                               Pertinent Vitals/Pain Pain Assessment Pain Assessment: Faces Faces Pain Scale: Hurts little more Pain Location: L knee/thigh Pain Descriptors / Indicators: Grimacing, Guarding Pain Intervention(s): Monitored during session, Premedicated before session    [place] expects to  be discharged to:: Private residence Living Arrangements: Alone Available Help at Discharge: Family;Available PRN/intermittently (daughter lives next door) Type of Home: House Home Access: Stairs to enter Entrance Stairs-Rails: Right Entrance Stairs-Number of Steps: 2   Home Layout: One level Home Equipment: Conservation officer, nature (2 wheels);Cane - quad      Prior Function Prior Level of Function : Independent/Modified Independent             Mobility Comments: Uses quad cane mostly; RW when needed; still driving, though usually daughter drives; reports doesn't get out of the house much       Hand Dominance   Dominant Hand: Right    Extremity/Trunk Assessment   Upper Extremity Assessment Upper Extremity Assessment: Defer to OT evaluation (noted tremors)    Lower Extremity Assessment Lower Extremity Assessment: Generalized weakness;LLE deficits/detail LLE Deficits / Details: Ace wrapped to upper thigh; grossly decr AROM and strength about the knee, limited by pain postop       Communication   Communication: Expressive difficulties (tremulous)  Cognition Arousal/Alertness: Awake/alert Behavior During Therapy: WFL for tasks assessed/performed Overall Cognitive Status: Within Functional Limits for tasks assessed                                          General Comments      Exercises     Assessment/Plan    PT Assessment Patient needs continued PT services  PT Problem List Decreased strength;Decreased range of motion;Decreased activity tolerance;Decreased mobility;Decreased balance;Decreased coordination;Decreased knowledge of use of DME;Decreased knowledge of precautions;Pain       PT Treatment Interventions DME instruction;Gait training;Functional mobility training;Therapeutic activities;Therapeutic exercise;Balance training;Patient/family education    PT Goals (Current goals can be found in the Care Plan section)  Acute Rehab PT Goals Patient  Stated Goal: Be able to stand on LLE PT Goal Formulation: With patient Time For Goal Achievement: 01/24/23 Potential to Achieve Goals: Good    Frequency Min 3X/week     Co-evaluation               AM-PAC PT "6 Clicks" Mobility  Outcome Measure Help needed turning from your back to your side while in a flat bed without using bedrails?: A Little Help needed moving from lying on your back to sitting on the side of a flat bed without using bedrails?: A Lot Help needed moving to and from a bed to a chair (including a wheelchair)?: A Lot Help needed standing up from a chair using your arms (e.g., wheelchair or bedside chair)?: A Lot Help needed to walk in hospital room?: Total Help needed climbing 3-5 steps with a railing? : Total 6 Click Score: 11    End of Session Equipment Utilized During Treatment: Gait belt Activity Tolerance: Patient tolerated treatment well Patient left: in chair;with call bell/phone within reach;with chair alarm set Nurse Communication: Mobility status PT Visit Diagnosis: Unsteadiness on feet (R26.81);Other abnormalities of gait and mobility (R26.89);History of falling (Z91.81);Pain Pain - Right/Left: Left Pain -  part of body: Knee;Leg    Time: [id number] PT Time Calculation (min) (ACUTE ONLY): 29 min   Charges:   PT Evaluation $PT Eval Moderate Complexity: 1 Mod PT Treatments $Therapeutic Activity: 8-22 mins        Roney Marion, PT  Acute Rehabilitation Services Office [phone]   [name] 01/10/2023, 1:56 PM

## 2023-01-10 NOTE — Evaluation (Signed)
Occupational Therapy Evaluation Patient Details Name: Dana [name] MRN: [id number] DOB: 1941-01-04 Today's Date: 01/10/2023   History of Present Illness Dana [name] is a 82 y.o. female  who presented to [facility] ED on 2/18 with complaints of left knee pain.  Patient was at home sitting in a chair in which she slid down and hit the back of her head and fell causing severe knee pain with inability to ambulate; sustained a L tibial plateau fx, now s/p ORIF, TWB, no knee ROM restrictions; with past medical history significant for HTN, HLD, prediabetes, vitamin D deficiency, rheumatoid arthritis, anemia, osteopenia, aortic atherosclerosis   Clinical Impression   PTA pt lives at home alone independently. Pt endorses a previous fall in 07/2022 resulting in a L wrist fracture. Currently requires Max A with LB ADL tasks and Mod A with limited transfers to [facility] @ RW level (difficulty maintaining TDWB during pivot transfer) due to deficits listed below. Pt will benefit from rehab at SNF to maximize functional level of independence with goal of returning home. Acute OT will follow.  VSS on RA. Daughter present for session. Pt very appreciative.      Recommendations for follow up therapy are one component of a multi-disciplinary discharge planning process, led by the attending physician.  Recommendations may be updated based on patient status, additional functional criteria and insurance authorization.   Follow Up Recommendations  Skilled nursing-short term rehab (<3 hours/day)     Assistance Recommended at Discharge Frequent or constant Supervision/Assistance  Patient can return home with the following A lot of help with walking and/or transfers;A lot of help with bathing/dressing/bathroom;Assistance with cooking/housework;Assist for transportation;Help with stairs or ramp for entrance    Functional Status Assessment  Patient has had a recent decline in their functional status and demonstrates the  ability to make significant improvements in function in a reasonable and predictable amount of time.  Equipment Recommendations  BSC/3in1    Recommendations for Other Services       Precautions / Restrictions Precautions Precautions: Fall Restrictions Weight Bearing Restrictions: Yes LLE Weight Bearing: Touchdown weight bearing      Mobility Bed Mobility               General bed mobility comments: OOB in chair    Transfers Overall transfer level: Needs assistance Equipment used: Rolling walker (2 wheels) Transfers: Sit to/from Stand, Bed to chair/wheelchair/BSC Sit to Stand: Mod assist Stand pivot transfers: Mod assist (unable to maintain TDWB)         General transfer comment: Mod assist to rise; cues for hand placement; difficulty keeping weight off LLE - pt states she feels "tired"     Balance Overall balance assessment: Needs assistance   Sitting balance-Leahy Scale: Fair       Standing balance-Leahy Scale: Poor                             ADL either performed or assessed with clinical judgement   ADL Overall ADL's : Needs assistance/impaired     Grooming: Set up   Upper Body Bathing: Set up;Sitting   Lower Body Bathing: Moderate assistance;Sitting/lateral leans;Sit to/from stand   Upper Body Dressing : Set up;Sitting   Lower Body Dressing: Maximal assistance;Sit to/from stand   Toilet Transfer: Moderate assistance;+2 for safety/equipment;BSC/3in1;Rolling walker (2 wheels)   Toileting- Clothing Manipulation and Hygiene: Maximal assistance       Functional mobility during ADLs: Moderate assistance;+2 for  safety/equipment;Rolling walker (2 wheels);Cueing for safety       Vision Baseline Vision/History: 1 Wears glasses (reading)       Perception     Praxis      Pertinent Vitals/Pain Pain Assessment Pain Assessment: Faces Faces Pain Scale: Hurts little more Pain Location: L knee/thigh Pain Descriptors / Indicators:  Grimacing, Guarding Pain Intervention(s): Limited activity within patient's tolerance, Repositioned     Hand Dominance Right   Extremity/Trunk Assessment Upper Extremity Assessment Upper Extremity Assessment: Generalized weakness   Lower Extremity Assessment Lower Extremity Assessment: Defer to PT evaluation LLE Deficits / Details:   Cervical / Trunk Assessment Cervical / Trunk Assessment: Normal   Communication Communication Communication: Expressive difficulties (tremulous)   Cognition Arousal/Alertness: Awake/alert Behavior During Therapy: WFL for tasks assessed/performed Overall Cognitive Status: Within Functional Limits for tasks assessed                                 General Comments: daughter states her Mom is at her cognitive baseline, which is functional     General Comments       Exercises     Shoulder Instructions      Home Living Family/patient expects to be discharged to:: Private residence Living Arrangements: Alone Available Help at Discharge: Family;Available PRN/intermittently (daughter lives next door) Type of Home: House Home Access: Stairs to enter [employer] of Steps: 2 Entrance Stairs-Rails: Right Home Layout: One level     Bathroom Shower/Tub: Teacher, early years/pre: Standard     Home Equipment: Conservation officer, nature (2 wheels);Cane - quad          Prior Functioning/Environment Prior Level of Function : Independent/Modified Independent             Mobility Comments: Uses quad cane mostly; RW when needed; still driving, though usually daughter drives; reports doesn't get out of the house much          OT Problem List: Decreased strength;Decreased range of motion;Decreased activity tolerance;Impaired balance (sitting and/or standing);Decreased safety awareness;Decreased knowledge of use of DME or AE;Decreased knowledge of precautions;Pain      OT Treatment/Interventions: Self-care/ADL  training;Therapeutic exercise;DME and/or AE instruction;Therapeutic activities;Patient/family education;Balance training    OT Goals(Current goals can be found in the care plan section) Acute Rehab OT Goals Patient Stated Goal: to get better OT Goal Formulation: With patient Time For Goal Achievement: 01/24/23 Potential to Achieve Goals: Good  OT Frequency: Min 2X/week    Co-evaluation              AM-PAC OT "6 Clicks" Daily Activity     Outcome Measure Help from another person eating meals?: None Help from another person taking care of personal grooming?: A Little Help from another person toileting, which includes using toliet, bedpan, or urinal?: A Lot Help from another person bathing (including washing, rinsing, drying)?: A Lot Help from another person to put on and taking off regular upper body clothing?: A Little Help from another person to put on and taking off regular lower body clothing?: A Lot 6 Click Score: 16   End of Session Equipment Utilized During Treatment: Gait belt;Rolling walker (2 wheels) Nurse Communication: Mobility status;Weight bearing status;Precautions  Activity Tolerance: Patient tolerated treatment well Patient left: in chair;with call bell/phone within reach;with chair alarm set;with family/visitor present  OT Visit Diagnosis: Unsteadiness on feet (R26.81);Other abnormalities of gait and mobility (R26.89);Muscle weakness (generalized) (M62.81);History of falling (Z91.81);Pain Pain -  Right/Left: Left Pain - part of body: Leg                Time: [id number] OT Time Calculation (min): 38 min Charges:  OT General Charges $OT Visit: 1 Visit OT Evaluation $OT Eval Moderate Complexity: 1 Mod OT Treatments $Self Care/Home Management : 23-37 mins  Maurie Boettcher, OT/L   Acute OT Clinical Specialist [place] Pager [phone] Office [phone]   [facility] 01/10/2023, 3:59 PM

## 2023-01-10 NOTE — NC FL2 (Signed)
[place] LEVEL OF CARE FORM     IDENTIFICATION  Patient Name: Dana [name] Birthdate: 06/22/1941 Sex: female Admission Date (Current Location): 01/08/2023  [facility] and [place] Number:  Herbalist and Address:  [facility] [place] [place] [address], [place], [place] [address]      Provider Number: [id number]  Attending Physician Name and Address:  [place] Eric J, DO  Relative Name and Phone Number:  [name] Daughter [phone]    Current Level of Care: Hospital Recommended Level of Care: [place] Prior Approval Number:    Date Approved/Denied:   PASRR Number: [id number]  Discharge Plan: SNF    Current Diagnoses: Patient Active Problem List   Diagnosis Date Noted   Disp supracondylar fx with intracondylar extension of distal femur ([place]) 01/09/2023   Left tibial fracture 01/08/2023   Elevated troponin 01/08/2023   History of basal cell carcinoma (BCC) 05/06/2021   History of SCC (squamous cell carcinoma) of skin 05/06/2021   Right knee pain 12/10/2020   Diverticulosis 09/03/2020   Aortic atherosclerosis ([place]) by CT scan 08/2020 08/17/2020   Osteopenia 12/05/2018   FHx: heart disease 09/03/2018   Former smoker (quit 1985) 09/03/2018   BMI 21.0-21.9, adult 05/30/2018   Generalized OA 09/02/2015   Vitamin D deficiency 01/06/2014   Hypertension    Rheumatoid arthritis ([place])    Hereditary essential tremor    Hypothyroidism    Migraines    Hyperlipidemia, mixed    Chronic anemia     Orientation RESPIRATION BLADDER Height & Weight     Self, Time, Situation, Place  O2 Continent Weight: 120 lb (54.4 kg) Height:  5' 2.25" (158.1 cm)  BEHAVIORAL SYMPTOMS/MOOD NEUROLOGICAL BOWEL NUTRITION STATUS      Continent Diet (see discharge summary)  AMBULATORY STATUS COMMUNICATION OF NEEDS Skin   Limited Assist Verbally Skin abrasions, Other (Comment) (ecchymosis)                       Personal  Care Assistance Level of Assistance  Bathing, Feeding, Dressing Bathing Assistance: Limited assistance Feeding assistance: Limited assistance Dressing Assistance: Maximum assistance     Functional Limitations Info  Sight, Hearing, Speech Sight Info: Adequate Hearing Info: Adequate Speech Info: Adequate    SPECIAL CARE FACTORS FREQUENCY  PT (By licensed PT), OT (By licensed OT)     PT Frequency: 5x week OT Frequency: 5x week            Contractures Contractures Info: Not present    Additional Factors Info  Code Status, Allergies Code Status Info: full Allergies Info: Codeine, Mysoline (Primidone), Sulfa Antibiotics           Current Medications (01/10/2023):  This is the current hospital active medication list Current Facility-Administered Medications  Medication Dose Route Frequency Provider Last Rate Last Admin   0.9 %  sodium chloride infusion   Intravenous Continuous Corinne Ports, PA-C 50 mL/hr at 01/09/23 1745 New Bag at 01/09/23 1745   acetaminophen (TYLENOL) tablet 325-650 mg  325-650 mg Oral Q6H PRN Corinne Ports, PA-C       ceFAZolin (ANCEF) IVPB 2g/100 mL premix  2 g Intravenous Q8H McClung, Sarah A, PA-C 200 mL/hr at 01/10/23 0600 2 g at 01/10/23 0600   diazepam (VALIUM) tablet 5 mg  5 mg Oral QHS PRN Corinne Ports, PA-C   5 mg at 01/08/23 2204   docusate sodium (COLACE) capsule 100 mg  100 mg  Oral BID Corinne Ports, PA-C   100 mg at 01/10/23 [id number]   enoxaparin (LOVENOX) injection 40 mg  40 mg Subcutaneous Q24H Corinne Ports, PA-C   40 mg at 01/10/23 [id number]   fluticasone (FLONASE) 50 MCG/ACT nasal spray 2 spray  2 spray Each Nare Daily [place] Eric J, DO   2 spray at 01/10/23 1016   HYDROcodone-acetaminophen (NORCO) 7.5-325 MG per tablet 1-2 tablet  1-2 tablet Oral Q4H PRN Corinne Ports, PA-C       HYDROcodone-acetaminophen (NORCO/VICODIN) 5-325 MG per tablet 1-2 tablet  1-2 tablet Oral Q4H PRN Corinne Ports, PA-C       HYDROmorphone  (DILAUDID) injection 0.5 mg  0.5 mg Intravenous Q2H PRN Corinne Ports, PA-C   0.5 mg at 01/10/23 0602   levothyroxine (SYNTHROID) tablet 100 mcg  100 mcg Oral Once per day on Sun Tue Wed Thu Sat Corinne Ports, PA-C   100 mcg at 01/10/23 [id number]   levothyroxine (SYNTHROID) tablet 50 mcg  50 mcg Oral Once per day on Mon Fri Corinne Ports, PA-C       loratadine (CLARITIN) tablet 10 mg  10 mg Oral Daily [place] Eric J, DO   10 mg at 01/10/23 [id number]   methocarbamol (ROBAXIN) tablet 500 mg  500 mg Oral Q6H PRN Corinne Ports, PA-C       Or   methocarbamol (ROBAXIN) 500 mg in dextrose 5 % 50 mL IVPB  500 mg Intravenous Q6H PRN Thereasa Solo, Sarah A, PA-C       metoCLOPramide (REGLAN) tablet 5-10 mg  5-10 mg Oral Q8H PRN Thereasa Solo, Sarah A, PA-C       Or   metoCLOPramide (REGLAN) injection 5-10 mg  5-10 mg Intravenous Q8H PRN Corinne Ports, PA-C       montelukast (SINGULAIR) tablet 10 mg  10 mg Oral QHS Corinne Ports, PA-C   10 mg at 01/09/23 2150   multivitamin with minerals tablet 1 tablet  1 tablet Oral Daily Corinne Ports, PA-C   1 tablet at 01/10/23 [id number]   mupirocin ointment (BACTROBAN) 2 % 1 Application  1 Application Nasal BID Corinne Ports, PA-C   1 Application at 0000000 [id number]   ondansetron (ZOFRAN) injection 4 mg  4 mg Intravenous Q6H PRN Corinne Ports, PA-C       polyethylene glycol (MIRALAX / GLYCOLAX) packet 17 g  17 g Oral Daily PRN Corinne Ports, PA-C       propranolol (INDERAL) tablet 80 mg  80 mg Oral BID Corinne Ports, PA-C   80 mg at 01/10/23 [id number]     Discharge Medications: Please see discharge summary for a list of discharge medications.  Relevant Imaging Results:  Relevant Lab Results:   Additional Information SSN: [id number].  Pt is not vaccinated for covid.  Joanne Chars, LCSW

## 2023-01-10 NOTE — Progress Notes (Signed)
Orthopaedic Trauma Progress Note  SUBJECTIVE: Doing fairly well this morning.  Currently eating her breakfast.  Reports intermittent, mild pain about operative site. No chest pain. No SOB. No nausea/vomiting. No other complaints.   OBJECTIVE:  Vitals:   01/10/23 0457 01/10/23 0829  BP: (!) 131/58 (!) 139/55  Pulse: 87 (!) 101  Resp: 15 16  Temp: 98.2 F (36.8 C) 98.3 F (36.8 C)  SpO2: 100% 99%    General: Sitting up in bed eating breakfast, no acute distress Respiratory: No increased work of breathing.  Left lower extremity: Dressings clean, dry, intact.  Tender about the distal femur as expected.  Swelling about the knee as expected.  Tolerates ankle dorsiflexion and plantarflexion.  No calf tenderness.  Endorses sensation to light touch over the toes.  Neurovascularly intact.  IMAGING: Stable post op imaging.   LABS:  Results for orders placed or performed during the hospital encounter of 01/08/23 (from the past 24 hour(s))  ABO/Rh     Status: None   Collection Time: 01/09/23  3:35 PM  Result Value Ref Range   ABO/RH(D)      O POS Performed at [facility] [address] [place],  [address]   CBC     Status: Abnormal   Collection Time: 01/09/23  6:22 PM  Result Value Ref Range   WBC 9.3 4.0 - 10.5 K/uL   RBC 2.76 (L) 3.87 - 5.11 MIL/uL   Hemoglobin 9.0 (L) 12.0 - 15.0 g/dL   HCT 27.4 (L) 36.0 - 46.0 %   MCV 99.3 80.0 - 100.0 fL   MCH 32.6 26.0 - 34.0 pg   MCHC 32.8 30.0 - 36.0 g/dL   RDW 15.5 11.5 - 15.5 %   Platelets 165 150 - 400 K/uL   nRBC 0.0 0.0 - 0.2 %  Creatinine, serum     Status: None   Collection Time: 01/09/23  6:22 PM  Result Value Ref Range   Creatinine, Ser 0.76 0.44 - 1.00 mg/dL   GFR, Estimated >60 >60 mL/min  CBC     Status: Abnormal   Collection Time: 01/10/23 12:12 AM  Result Value Ref Range   WBC 7.9 4.0 - 10.5 K/uL   RBC 2.42 (L) 3.87 - 5.11 MIL/uL   Hemoglobin 8.1 (L) 12.0 - 15.0 g/dL   HCT 23.9 (L) 36.0 - 46.0 %   MCV  98.8 80.0 - 100.0 fL   MCH 33.5 26.0 - 34.0 pg   MCHC 33.9 30.0 - 36.0 g/dL   RDW 15.4 11.5 - 15.5 %   Platelets 147 (L) 150 - 400 K/uL   nRBC 0.0 0.0 - 0.2 %  Basic metabolic panel     Status: Abnormal   Collection Time: 01/10/23 12:12 AM  Result Value Ref Range   Sodium 137 135 - 145 mmol/L   Potassium 3.8 3.5 - 5.1 mmol/L   Chloride 101 98 - 111 mmol/L   CO2 27 22 - 32 mmol/L   Glucose, Bld 149 (H) 70 - 99 mg/dL   BUN 15 8 - 23 mg/dL   Creatinine, Ser 0.82 0.44 - 1.00 mg/dL   Calcium 8.0 (L) 8.9 - 10.3 mg/dL   GFR, Estimated >60 >60 mL/min   Anion gap 9 5 - 15  Magnesium     Status: None   Collection Time: 01/10/23 12:12 AM  Result Value Ref Range   Magnesium 2.1 1.7 - 2.4 mg/dL    ASSESSMENT: Dana [name] is a 82  y.o. female, 1 Day Post-Op s/p OPEN REDUCTION INTERNAL FIXATION left CV/Blood loss: Acute blood loss anemia, Hgb 8.1 this morning. Hemodynamically stable  PLAN: Weightbearing: TDWB LLE ROM: Okay for unrestricted range of motion of the knee Incisional and dressing care: Reinforce dressings as needed  Showering: Okay to begin showering getting incisions wet 01/12/2023 Orthopedic device(s): None  Pain management:  1. Tylenol 325-650 mg q 6 hours PRN 2. Robaxin 500 mg q 6 hours PRN 3.  Norco 5-325 or 7.5-325 mg q 4 hours PRN moderate pain/severe 4. Dilaudid 0.5 mg q 2 hours PRN VTE prophylaxis: Lovenox, SCDs ID:  Ancef 2gm post op Foley/Lines:  No foley, KVO IVFs Impediments to Fracture Healing: Vitamin D level pending, will start supplementation as indicated Dispo: PT/OT evaluation today, patient will likely require SNF.  Plan to remove dressings LLE tomorrow 01/11/2023.  Okay for discharge from ortho standpoint once cleared by medicine team and therapies  D/C recommendations: -Norco 5-325 for pain control -Eliquis 2.5 mg twice daily x 30 days for DVT prophylaxis -Possible need for Vit D supplementation  Follow - up plan: 2 weeks after discharge for  wound check and repeat x-rays   Contact information:  Katha Hamming MD, Rushie Nyhan PA-C. After hours and holidays please check [email or web address] for group call information for [facility]   Gwinda Passe, PA-C [phone] (office) [email or web address]

## 2023-01-10 NOTE — TOC Initial Note (Signed)
Transition of Care [facility]) - Initial/Assessment Note    Patient Details  Name: Dana [name] MRN: [id number] Date of Birth: [birth year]  [facility]) CM/SW Contact:    Joanne Chars, LCSW Phone Number: 01/10/2023, 2:37 PM  Clinical Narrative:     CSW met with pt regarding DC recommendation for SNF.  Pt is agreeable to this, medicare choice document given.  Permission given to send out referral in hub and to speak with daughter [name].  Pt lives home alone, no current services.  Pt is not vaccinated for covid.  CSW attempted to reach daughter [name] by phone, no answer, left message.                Expected Discharge Plan: Skilled Nursing Facility Barriers to Discharge: Continued Medical Work up, SNF Pending bed offer   Patient Goals and CMS Choice Patient states their goals for this hospitalization and ongoing recovery are:: be able to be home, do normal activities CMS Medicare.gov Compare Post Acute Care list provided to:: Patient Choice offered to / list presented to : Patient      Expected Discharge Plan and Services In-house Referral: Clinical Social Work   Post Acute Care Choice: [place] Living arrangements for the past 2 months: [place]                                      Prior Living Arrangements/Services Living arrangements for the past 2 months: Single Family Home Lives with:: Self Patient language and need for interpreter reviewed:: Yes Do you feel safe going back to the place where you live?: Yes        Care giver support system in place?: Yes (comment) Current home services: Other (comment) (none) Criminal Activity/Legal Involvement Pertinent to Current Situation/Hospitalization: No - Comment as needed  Activities of Daily Living Home Assistive Devices/Equipment: Eyeglasses, Environmental consultant (specify type) ADL Screening (condition at time of admission) Patient's cognitive ability adequate to safely complete  daily activities?: Yes Is the patient deaf or have difficulty hearing?: No Does the patient have difficulty seeing, even when wearing glasses/contacts?: No Does the patient have difficulty concentrating, remembering, or making decisions?: No Patient able to express need for assistance with ADLs?: Yes Does the patient have difficulty dressing or bathing?: Yes Independently performs ADLs?: No Does the patient have difficulty walking or climbing stairs?: Yes Weakness of Legs: Both Weakness of Arms/Hands: None  Permission Sought/Granted Permission sought to share information with : Family Supports Permission granted to share information with : Yes, Verbal Permission Granted  Share Information with NAME: daughter [name]  Permission granted to share info w AGENCY: SNF        Emotional Assessment Appearance:: Appears stated age Attitude/Demeanor/Rapport: Engaged Affect (typically observed): Appropriate, Pleasant Orientation: : Oriented to Self, Oriented to Place, Oriented to  Time, Oriented to Situation      Admission diagnosis:  Left tibial fracture [S82.202A] Fall, initial encounter [W19.XXXA] Patient Active Problem List   Diagnosis Date Noted   Disp supracondylar fx with intracondylar extension of distal femur ([place]) 01/09/2023   Left tibial fracture 01/08/2023   Elevated troponin 01/08/2023   History of basal cell carcinoma (BCC) 05/06/2021   History of SCC (squamous cell carcinoma) of skin 05/06/2021   Right knee pain 12/10/2020   Diverticulosis 09/03/2020   Aortic atherosclerosis ([place]) by CT scan 08/2020 08/17/2020   Osteopenia 12/05/2018  FHx: heart disease 09/03/2018   Former smoker (quit 1985) 09/03/2018   BMI 21.0-21.9, adult 05/30/2018   Generalized OA 09/02/2015   Vitamin D deficiency 01/06/2014   Hypertension    Rheumatoid arthritis ([place])    Hereditary essential tremor    Hypothyroidism    Migraines    Hyperlipidemia, mixed    Chronic anemia    PCP:  Unk Pinto, MD Pharmacy:   [facility], [address] [place] [address] Phone: [phone] Fax: [phone]  [facility] [place], [address] [place] [address] Phone: [phone] Fax: [phone]     Social Determinants of Health (SDOH) Social History: [place]: No Food Insecurity (01/08/2023)  Housing: Low Risk  (01/08/2023)  Transportation Needs: No Transportation Needs (01/08/2023)  Utilities: Not At Risk (01/08/2023)  Depression (PHQ2-9): Low Risk  (07/12/2022)  Tobacco Use: Medium Risk (01/10/2023)   SDOH Interventions:     Readmission Risk Interventions     No data to display

## 2023-01-11 DIAGNOSIS — S728X2A Other fracture of left femur, initial encounter for closed fracture: Secondary | ICD-10-CM | POA: Diagnosis not present

## 2023-01-11 LAB — PREPARE RBC (CROSSMATCH)

## 2023-01-11 LAB — RETICULOCYTES
Immature Retic Fract: 33.4 % — ABNORMAL HIGH (ref 2.3–15.9)
RBC.: 2.07 MIL/uL — ABNORMAL LOW (ref 3.87–5.11)
Retic Count, Absolute: 75.3 10*3/uL (ref 19.0–186.0)
Retic Ct Pct: 3.6 % — ABNORMAL HIGH (ref 0.4–3.1)

## 2023-01-11 LAB — CBC
HCT: 22.4 % — ABNORMAL LOW (ref 36.0–46.0)
Hemoglobin: 7.1 g/dL — ABNORMAL LOW (ref 12.0–15.0)
MCH: 32.7 pg (ref 26.0–34.0)
MCHC: 31.7 g/dL (ref 30.0–36.0)
MCV: 103.2 fL — ABNORMAL HIGH (ref 80.0–100.0)
Platelets: 156 10*3/uL (ref 150–400)
RBC: 2.17 MIL/uL — ABNORMAL LOW (ref 3.87–5.11)
RDW: 15.9 % — ABNORMAL HIGH (ref 11.5–15.5)
WBC: 7 10*3/uL (ref 4.0–10.5)
nRBC: 0 % (ref 0.0–0.2)

## 2023-01-11 LAB — IRON AND TIBC
Iron: 18 ug/dL — ABNORMAL LOW (ref 28–170)
Saturation Ratios: 8 % — ABNORMAL LOW (ref 10.4–31.8)
TIBC: 214 ug/dL — ABNORMAL LOW (ref 250–450)
UIBC: 196 ug/dL

## 2023-01-11 LAB — FERRITIN: Ferritin: 48 ng/mL (ref 11–307)

## 2023-01-11 LAB — FOLATE: Folate: 15.6 ng/mL (ref >5.9)

## 2023-01-11 LAB — VITAMIN B12: Vitamin B-12: 162 pg/mL — ABNORMAL LOW (ref 180–914)

## 2023-01-11 MED ORDER — SODIUM CHLORIDE 0.9% IV SOLUTION: Freq: Once | INTRAVENOUS | Status: AC

## 2023-01-11 MED ORDER — APIXABAN 2.5 MG PO TABS: 2.5000 mg | ORAL_TABLET | Freq: Two times a day (BID) | ORAL | 0 refills | Status: DC

## 2023-01-11 MED ORDER — HYDROCODONE-ACETAMINOPHEN 5-325 MG PO TABS: 1.0000 | ORAL_TABLET | ORAL | 0 refills | Status: DC | PRN

## 2023-01-11 NOTE — Progress Notes (Signed)
Physical Therapy Treatment Patient Details Name: Dana [name] MRN: [id number] DOB: December 18, 1940 Today's Date: 01/11/2023   History of Present Illness Dana [name] is a 82 y.o. female  who presented to [facility] ED on 2/18 with complaints of left knee pain.  Patient was at home sitting in a chair in which she slid down and hit the back of her head and fell causing severe knee pain with inability to ambulate; sustained a L tibial plateau fx, now s/p ORIF, TWB, no knee ROM restrictions; with past medical history significant for HTN, HLD, prediabetes, vitamin D deficiency, rheumatoid arthritis, anemia, osteopenia, aortic atherosclerosis    PT Comments    Continuing work on functional mobility and activity tolerance;  Session focused on functional transfers; Mod assist for sit <> stand and basic pivot transfer to pt's R side; Modest progress noted, though having more difficulty maintaining TWB LLE this session; Able to complete 2 transfers with session, and with one person assist (2 person assist on PT eval yesterday); daughter present, Questions solicited and answered; Continue to recommend SNF for post-acute therapy needs.   Recommendations for follow up therapy are one component of a multi-disciplinary discharge planning process, led by the attending physician.  Recommendations may be updated based on patient status, additional functional criteria and insurance authorization.  Follow Up Recommendations  Skilled nursing-short term rehab (<3 hours/day) Can patient physically be transported by private vehicle: No   Assistance Recommended at Discharge Intermittent Supervision/Assistance  Patient can return home with the following A little help with walking and/or transfers;A lot of help with bathing/dressing/bathroom;Assistance with cooking/housework;Assist for transportation;Help with stairs or ramp for entrance   Equipment Recommendations  None recommended by PT    Recommendations for Other  Services       Precautions / Restrictions Precautions Precautions: Fall Restrictions LLE Weight Bearing: Touchdown weight bearing Other Position/Activity Restrictions: Difficutly maintaining TWB LLE     Mobility  Bed Mobility Overal bed mobility: Needs Assistance Bed Mobility: Supine to Sit     Supine to sit: Mod assist     General bed mobility comments: Cues for technqiue; good half-bridge to EOB with RLE and min assist; Light mod assist to help elevate trunk and square off hips at EOB    Transfers Overall transfer level: Needs assistance Equipment used: 1 person hand held assist, Rolling walker (2 wheels) Transfers: Bed to chair/wheelchair/BSC, Sit to/from Stand Sit to Stand: Mod assist Stand pivot transfers: Mod assist (unable to maintain TDWB)         General transfer comment: Mod assist to rise; cues for hand placement; difficulty keeping weight off of her LLE and heel-toe pivot on her RLE    Ambulation/Gait                   Stairs             Wheelchair Mobility    Modified Rankin (Stroke Patients Only)       Balance     Sitting balance-Leahy Scale: Fair       Standing balance-Leahy Scale: Poor                              Cognition Arousal/Alertness: Awake/alert Behavior During Therapy: WFL for tasks assessed/performed Overall Cognitive Status: Within Functional Limits for tasks assessed  General Comments: daughter states her Mom is at her cognitive baseline, which is functional        Exercises      General Comments        Pertinent Vitals/Pain Pain Assessment Pain Assessment: Faces Faces Pain Scale: Hurts little more Pain Location: L knee/thigh Pain Descriptors / Indicators: Grimacing, Guarding Pain Intervention(s): Limited activity within patient's tolerance    Home Living                          Prior Function            PT Goals  (current goals can now be found in the care plan section) Acute Rehab PT Goals Patient Stated Goal: Be able to stand on LLE PT Goal Formulation: With patient Time For Goal Achievement: 01/24/23 Potential to Achieve Goals: Good Progress towards PT goals: Progressing toward goals    Frequency    Min 3X/week      PT Plan Current plan remains appropriate    Co-evaluation              AM-PAC PT "6 Clicks" Mobility   Outcome Measure  Help needed turning from your back to your side while in a flat bed without using bedrails?: A Little Help needed moving from lying on your back to sitting on the side of a flat bed without using bedrails?: A Lot Help needed moving to and from a bed to a chair (including a wheelchair)?: A Lot Help needed standing up from a chair using your arms (e.g., wheelchair or bedside chair)?: A Lot Help needed to walk in hospital room?: Total Help needed climbing 3-5 steps with a railing? : Total 6 Click Score: 11    End of Session Equipment Utilized During Treatment: Gait belt Activity Tolerance: Patient tolerated treatment well Patient left: in chair;with call bell/phone within reach;with chair alarm set Nurse Communication: Mobility status PT Visit Diagnosis: Unsteadiness on feet (R26.81);Other abnormalities of gait and mobility (R26.89);History of falling (Z91.81);Pain Pain - Right/Left: Left Pain - part of body: Knee;Leg     Time: [id number] PT Time Calculation (min) (ACUTE ONLY): 34 min  Charges:  $Therapeutic Activity: 23-37 mins                     Roney Marion, PT  Acute Rehabilitation Services Office [phone]    Dana [name] 01/11/2023, 1:13 PM

## 2023-01-11 NOTE — Progress Notes (Addendum)
PROGRESS NOTE    Dana [name]  [id number] DOB: 1941/02/16 DOA: 01/08/2023 PCP: Unk Pinto, MD    Brief Narrative:   Dana [name] is a 82 y.o. female with past medical history significant for HTN, HLD, prediabetes, vitamin D deficiency, rheumatoid arthritis, anemia, osteopenia, aortic atherosclerosis who presented to [facility] ED on 2/18 with complaints of left knee pain.  [name]  Fall was witnessed by her daughter.  In the ED, WBC 11.0, hemoglobin 10.9, platelets 200.  Sodium 141, potassium 3.9, chloride 103, CO2 28, glucose 96, BUN 12, creatinine 0.67.  AST 26, ALT 14, total bilirubin 0.8.  High sensitive troponin 45>56.  INR 1.1.  TSH 4.148.  Urinalysis unrevealing.  CT head/C-spine with no acute intracranial pathology, small vessel white matter disease, no fracture or subluxation of the C-spine.  Chest x-ray with no acute abnormality.  Left knee x-ray with comminuted intra-articular fractures involving the bilateral femoral condyles with impaction and varus angulation, likely hemarthrosis with diffuse soft tissue swelling.  Orthopedics was consulted.  TRH was consulted for further evaluation management of left femur fracture fracture.  Assessment & Plan:   Left distal femur fracture s/p ORIF Patient presenting to ED with severe left lower extremity pain with inability to ambulate following fall at home.  Imaging notable for comminuted displaced intra-articular distal femoral fracture with impaction and angulation, nondisplaced fracture anterior aspect lateral tibial plateau, tiny fracture upper tip of patella, diffuse osteopenia, tiny area AVN anterior aspect of the lateral femoral condyle.  Orthopedics was consulted and patient underwent ORIF left supracondylar distal femur fracture by Dr. Doreatha Martin on 01/09/2023 -- Orthopedics following,  appreciate assistance -- TDWB LLE -- Norco q6h PRN moderate pain -- Robaxin every 6 hours as needed muscle spasms -- Dilaudid 0.5 mg IV q2h PRN severe pain -- Currently on Lovenox for DVT prophylaxis, plan Eliquis 2.5 mg twice daily x 30 days on discharge -- Vitamin D 25 hydroxy level: 76 -- PT/OT evaluation: Pending -- Outpatient follow-up with orthopedics, Dr. Doreatha Martin 2 weeks for postoperative check, x-rays  Elevated troponin High sensitive troponin elevated 45 followed by 56.  EKG with NSR, no concerning dynamic changes.  Patient denies chest pain.  TTE with LVEF 65-70%, no LV regional wall motion normalities, mild LVH, LA moderately dilated, mild MR, mild aortic valve stenosis, IVC normal in size.  Etiology likely secondary to type II demand ischemia in the setting of femur fracture as above. -- Continue monitor on telemetry  Postoperative blood loss anemia Hemoglobin 10.9 on admission, trended down to 8.1 postoperatively.  Etiology likely secondary to acute blood loss anemia secondary to long bone fracture. -- Hgb 10.9>9.0>8.1>7.1 -- Anemia panel -- Transfuse 1 unit PRBC today, repeat H&H following transfusion, repeat CBC in a.m.  Essential hypertension -- Propranolol 80 mg p.o. twice daily   Hypothyroidism TSH 4.148, within normal limits. --Continue levothyroxine 100 mcg on S/T/W/T/S and 32mg on M/F  Rheumatoid arthritis On methotrexate, Plaquenil.  Hx tremor -- Diazepam 5 mg -- Propranolol   DVT prophylaxis: enoxaparin (LOVENOX) injection 40 mg Start: 01/10/23 0800 SCDs Start: 01/09/23 1722 SCDs Start: 01/08/23 2104    Code Status: Full Code Family Communication: No family present at bedside this morning  Disposition Plan:  Level of care: Telemetry Medical Status is: Inpatient Remains inpatient appropriate because: Transfusing 1 unit PRBC today,  pending SNF placement    Consultants:  Orthopedics, Dr. Doreatha Martin  Procedures:  ORIF left supracondylar distal femur  fracture by Dr. Doreatha Martin; 01/09/2023  Antimicrobials:  None   Subjective: Patient seen examined bedside, lying in bed.  Eating breakfast.  Daughter present at bedside.  Patient with mild pain.  Discussed hemoglobin dropped to 7.1 today and recommendation of transfusion of 1 unit PRBC, in agreement.  Daughter requesting talking with social work today.  No other questions or concerns at this time. Denies headache, no dizziness, no chest pain, no palpitations, no shortness of breath, no abdominal pain, no focal weakness, no fever/chills/night sweats, no nausea/vomiting/diarrhea, no paresthesias.  No acute events overnight per nursing staff.  Objective: Vitals:   01/10/23 1736 01/10/23 2114 01/11/23 0536 01/11/23 0851  BP: (!) 130/59 132/67 (!) 109/47 (!) 105/45  Pulse: 71 84 71 71  Resp: 17 17 18 17  $ Temp: 98.4 F (36.9 C) 98.4 F (36.9 C) 98.1 F (36.7 C) 98.2 F (36.8 C)  TempSrc: Oral Oral Oral Oral  SpO2: 100% 100% 99%   Weight:      Height:        Intake/Output Summary (Last 24 hours) at 01/11/2023 1039 Last data filed at 01/11/2023 0900 Gross per 24 hour  Intake 200 ml  Output --  Net 200 ml   Filed Weights   01/08/23 1447 01/09/23 1241  Weight: 54.4 kg 54.4 kg    Examination:  Physical Exam: GEN: NAD, alert and oriented x 3, wd/wn, elderly in appearance HEENT: NCAT, PERRL, EOMI, sclera clear, MMM PULM: CTAB w/o wheezes/crackles, normal respiratory effort, on room air CV: RRR w/o M/G/R GI: abd soft, NTND, NABS, no R/G/M MSK: no peripheral edema, moves all extremities independently, surgical dressing noted in place, clean/dry/intact NEURO: No focal deficits PSYCH: normal mood/affect Integumentary: Surgical dressing as above otherwise no other concerning rashes/lesions/wounds noted on exposed skin surfaces.     Data Reviewed: I have personally reviewed following labs and imaging studies  CBC: Recent Labs  Lab 01/08/23 1628 01/09/23 1822 01/10/23 0012  01/11/23 0403  WBC 11.0* 9.3 7.9 7.0  NEUTROABS 9.7*  --   --   --   HGB 10.9* 9.0* 8.1* 7.1*  HCT 34.3* 27.4* 23.9* 22.4*  MCV 101.2* 99.3 98.8 103.2*  PLT 200 165 147* [id number]   Basic Metabolic Panel: Recent Labs  Lab 01/08/23 1628 01/08/23 1722 01/09/23 1822 01/10/23 0012  NA 141  --   --  137  K 3.9  --   --  3.8  CL 103  --   --  101  CO2 28  --   --  27  GLUCOSE 96  --   --  149*  BUN 12  --   --  15  CREATININE 0.67  --  0.76 0.82  CALCIUM 8.4*  --   --  8.0*  MG  --  2.2  --  2.1  PHOS  --  2.8  --   --    GFR: Estimated Creatinine Clearance: 41.6 mL/min (by C-G formula based on SCr of 0.82 mg/dL). Liver Function Tests: Recent Labs  Lab 01/08/23 1722  AST 26  ALT 14  ALKPHOS 70  BILITOT 1.0  PROT 5.8*  ALBUMIN 3.4*   No results for input(s): "LIPASE", "AMYLASE" in the last 168 hours. No results for input(s): "AMMONIA" in the last 168 hours. Coagulation Profile: Recent Labs  Lab 01/08/23 1722  INR 1.1   Cardiac Enzymes: No results for  input(s): "CKTOTAL", "CKMB", "CKMBINDEX", "TROPONINI" in the last 168 hours. BNP (last 3 results) No results for input(s): "PROBNP" in the last 8760 hours. HbA1C: No results for input(s): "HGBA1C" in the last 72 hours. CBG: No results for input(s): "GLUCAP" in the last 168 hours. Lipid Profile: No results for input(s): "CHOL", "HDL", "LDLCALC", "TRIG", "CHOLHDL", "LDLDIRECT" in the last 72 hours. Thyroid Function Tests: Recent Labs    01/08/23 1722  TSH 4.148   Anemia Panel: No results for input(s): "VITAMINB12", "FOLATE", "FERRITIN", "TIBC", "IRON", "RETICCTPCT" in the last 72 hours. Sepsis Labs: No results for input(s): "PROCALCITON", "LATICACIDVEN" in the last 168 hours.  Recent Results (from the past 240 hour(s))  Surgical PCR screen     Status: None   Collection Time: 01/08/23  9:38 PM   Specimen: Nasal Mucosa; Nasal Swab  Result Value Ref Range Status   MRSA, PCR NEGATIVE NEGATIVE Final   Staphylococcus  aureus NEGATIVE NEGATIVE Final    Comment: (NOTE) The Xpert SA Assay (FDA approved for NASAL specimens in patients 44 years of age and older), is one component of a comprehensive surveillance program. It is not intended to diagnose infection nor to guide or monitor treatment. Performed at [facility], [place] [address] [place], [place] [address]          Radiology Studies: DG FEMUR MIN 2 VIEWS LEFT  Result Date: 01/09/2023 CLINICAL DATA:  Elective surgery. EXAM: LEFT FEMUR 2 VIEWS COMPARISON:  Preoperative imaging. FINDINGS: Six fluoroscopic spot views of the left femur obtained in the operating room. Lateral plate and multi screw fixation of distal femur fracture. Fluoroscopy time 37 seconds. Dose 1.61 mGy. IMPRESSION: Intraoperative fluoroscopy during distal femur fracture ORIF. Electronically Signed   By: Keith Rake M.D.   On: 01/09/2023 17:31   DG Knee Left Port  Result Date: 01/09/2023 CLINICAL DATA:  Postoperative. EXAM: PORTABLE LEFT KNEE - 1-2 VIEW COMPARISON:  Left knee radiographs 01/08/2023, CT left knee 01/09/2023 FINDINGS: There is new lateral plate and screw fixation of the proximal femoral diaphysis through the femoral condyles, fixating the previously seen displaced comminuted lateral femoral condyle fracture. Note is again made that the fracture lines extend into a distal lateral femoral metaphysis and the medial and lateral physeal regions. There is improved alignment with mild approximate 3 mm cortical step-off at the junction of the distal lateral metaphysis and lateral femoral condyle. No evidence of hardware failure. Postoperative intra-articular and subcutaneous air. IMPRESSION: Status post ORIF of the previously seen comminuted and displaced lateral femoral condyle fracture. There is improved alignment. Electronically Signed   By: Yvonne Kendall M.D.   On: 01/09/2023 17:10   DG C-Arm 1-60 Min-No Report  Result Date: 01/09/2023 Fluoroscopy was utilized by  the requesting physician.  No radiographic interpretation.   ECHOCARDIOGRAM COMPLETE  Result Date: 01/09/2023    ECHOCARDIOGRAM REPORT   Patient Name:   [facility] [place] Date of Exam: 01/09/2023 Medical Rec #:  [id number]           Height:       62.2 in Accession #:    [id number]          Weight:       120.0 lb Date of Birth:  08-20-1941           BSA:          1.543 m Patient Age:    82 years            BP:  122/64 mmHg Patient Gender: F                   HR:           90 bpm. Exam Location:  Inpatient Procedure: 2D Echo, Cardiac Doppler and Color Doppler Indications:     Elevated Troponin  History:         Patient has prior history of Echocardiogram examinations, most                  recent 04/05/2019. Risk Factors:Hypertension, Former Smoker and                  Dyslipidemia.  Sonographer:     Wilkie Aye RVT RCS Referring Phys:  [place] Diagnosing Phys: Glori Bickers MD  Sonographer Comments: Unable to reposition patient due to broken leg. IMPRESSIONS  1. Left ventricular ejection fraction, by estimation, is 65 to 70%. The left ventricle has normal function. The left ventricle has no regional wall motion abnormalities. There is mild left ventricular hypertrophy. Left ventricular diastolic parameters were normal.  2. Right ventricular systolic function is normal. The right ventricular size is normal.  3. Left atrial size was moderately dilated.  4. The mitral valve is normal in structure. Mild mitral valve regurgitation. No evidence of mitral stenosis.  5. Aortic valve is moderately thickened and appears functionally bicuspid. The aortic valve is abnormal. There is moderate calcification of the aortic valve. Aortic valve regurgitation is not visualized. Mild aortic valve stenosis. Aortic valve area, by  VTI measures 2.28 cm. Aortic valve mean gradient measures 8.5 mmHg. Aortic valve Vmax measures 2.04 m/s.  6. The inferior vena cava is normal in size with greater than 50%  respiratory variability, suggesting right atrial pressure of 3 mmHg. Comparison(s): EF 60-65. FINDINGS  Left Ventricle: Left ventricular ejection fraction, by estimation, is 65 to 70%. The left ventricle has normal function. The left ventricle has no regional wall motion abnormalities. The left ventricular internal cavity size was normal in size. There is  mild left ventricular hypertrophy. Left ventricular diastolic parameters were normal. Right Ventricle: The right ventricular size is normal. No increase in right ventricular wall thickness. Right ventricular systolic function is normal. Left Atrium: Left atrial size was moderately dilated. Right Atrium: Right atrial size was normal in size. Pericardium: There is no evidence of pericardial effusion. Mitral Valve: The mitral valve is normal in structure. Mild mitral valve regurgitation. No evidence of mitral valve stenosis. Tricuspid Valve: The tricuspid valve is normal in structure. Tricuspid valve regurgitation is mild . No evidence of tricuspid stenosis. Aortic Valve: Aortic valve is moderately thickened and appears functionally bicuspid. The aortic valve is abnormal. There is moderate calcification of the aortic valve. Aortic valve regurgitation is not visualized. Mild aortic stenosis is present. Aortic  valve mean gradient measures 8.5 mmHg. Aortic valve peak gradient measures 16.6 mmHg. Aortic valve area, by VTI measures 2.28 cm. Pulmonic Valve: The pulmonic valve was grossly normal. Pulmonic valve regurgitation is trivial. No evidence of pulmonic stenosis. Aorta: The aortic root is normal in size and structure. Venous: The inferior vena cava is normal in size with greater than 50% respiratory variability, suggesting right atrial pressure of 3 mmHg. IAS/Shunts: No atrial level shunt detected by color flow Doppler.  LEFT VENTRICLE PLAX 2D LVIDd:         3.50 cm   Diastology LVIDs:         2.00 cm   LV e' medial:  7.56 cm/s LV PW:         1.10 cm   LV E/e'  medial:  15.3 LV IVS:        1.30 cm   LV e' lateral:   7.68 cm/s LVOT diam:     2.00 cm   LV E/e' lateral: 15.1 LV SV:         79 LV SV Index:   51 LVOT Area:     3.14 cm  RIGHT VENTRICLE             IVC RV Basal diam:  3.40 cm     IVC diam: 1.90 cm RV Mid diam:    2.50 cm RV S prime:     17.40 cm/s TAPSE (M-mode): 2.2 cm LEFT ATRIUM             Index        RIGHT ATRIUM           Index LA diam:        3.10 cm 2.01 cm/m   RA Area:     10.60 cm LA Vol (A2C):   62.2 ml 40.31 ml/m  RA Volume:   21.30 ml  13.81 ml/m LA Vol (A4C):   80.4 ml 52.11 ml/m LA Biplane Vol: 69.1 ml 44.79 ml/m  AORTIC VALVE                     PULMONIC VALVE AV Area (Vmax):    2.37 cm      PV Vmax:       1.28 m/s AV Area (Vmean):   2.34 cm      PV Peak grad:  6.6 mmHg AV Area (VTI):     2.28 cm AV Vmax:           204.00 cm/s AV Vmean:          138.000 cm/s AV VTI:            0.347 m AV Peak Grad:      16.6 mmHg AV Mean Grad:      8.5 mmHg LVOT Vmax:         154.00 cm/s LVOT Vmean:        103.000 cm/s LVOT VTI:          0.252 m LVOT/AV VTI ratio: 0.73  AORTA Ao Root diam: 3.00 cm MITRAL VALVE                TRICUSPID VALVE MV Area (PHT): 3.28 cm     TR Peak grad:   34.3 mmHg MV Decel Time: 231 msec     TR Vmax:        293.00 cm/s MV E velocity: 116.00 cm/s MV A velocity: 103.00 cm/s  SHUNTS MV E/A ratio:  1.13         Systemic VTI:  0.25 m                             Systemic Diam: 2.00 cm Glori Bickers MD Electronically signed by Glori Bickers MD Signature Date/Time: 01/09/2023/2:01:20 PM    Final (Updated)         Scheduled Meds:  sodium chloride   Intravenous Once   docusate sodium  100 mg Oral BID   enoxaparin (LOVENOX) injection  40 mg Subcutaneous Q24H   fluticasone  2 spray Each Nare Daily   levothyroxine  100 mcg Oral Once per day on  Sun Tue Wed Thu Sat   levothyroxine  50 mcg Oral Once per day on Mon Fri   loratadine  10 mg Oral Daily   montelukast  10 mg Oral QHS   multivitamin with minerals  1 tablet  Oral Daily   mupirocin ointment  1 Application Nasal BID   propranolol  80 mg Oral BID   Continuous Infusions:  sodium chloride 50 mL/hr at 01/09/23 1745   methocarbamol (ROBAXIN) IV       LOS: 3 days    Time spent: 48 minutes spent on chart review, discussion with nursing staff, consultants, updating family and interview/physical exam; more than 50% of that time was spent in counseling and/or coordination of care.    Ahsley Attwood J [place] DO [facility] Available via Epic secure chat 7am-7pm After these hours, please refer to coverage provider listed on [email or web address] 01/11/2023, 10:39 AM

## 2023-01-11 NOTE — Care Management Important Message (Signed)
Important Message  Patient Details  Name: Dana [name] MRN: [id number] Date of Birth: Oct 28, 1941   Medicare Important Message Given:  Yes     Hannah Beat 01/11/2023, 4:34 PM

## 2023-01-11 NOTE — Progress Notes (Signed)
Changed patient tele battery

## 2023-01-11 NOTE — Progress Notes (Signed)
Orthopaedic Trauma Progress Note  SUBJECTIVE: Doing fairly well this morning. Reports intermittent, mild pain about operative site. Notes some pins and needles feeling around her ankle. No chest pain. No SOB. No nausea/vomiting. No other complaints. Tolerating diet and fluids. +BM  OBJECTIVE:  Vitals:   01/11/23 0536 01/11/23 0851  BP: (!) 109/47 (!) 105/45  Pulse: 71 71  Resp: 18 17  Temp: 98.1 F (36.7 C) 98.2 F (36.8 C)  SpO2: 99%     General: Sitting up in bed eating breakfast, no acute distress Respiratory: No increased work of breathing.  Left lower extremity: Dressings removed, incisions are clean, dry, intact.  Tender about the distal femur as expected.  Swelling about the knee as expected.  Tolerates ankle dorsiflexion and plantarflexion.  No calf tenderness.  Endorses sensation to light touch over the toes.  Neurovascularly intact.  IMAGING: Stable post op imaging.   LABS:  Results for orders placed or performed during the hospital encounter of 01/08/23 (from the past 24 hour(s))  CBC     Status: Abnormal   Collection Time: 01/11/23  4:03 AM  Result Value Ref Range   WBC 7.0 4.0 - 10.5 K/uL   RBC 2.17 (L) 3.87 - 5.11 MIL/uL   Hemoglobin 7.1 (L) 12.0 - 15.0 g/dL   HCT 22.4 (L) 36.0 - 46.0 %   MCV 103.2 (H) 80.0 - 100.0 fL   MCH 32.7 26.0 - 34.0 pg   MCHC 31.7 30.0 - 36.0 g/dL   RDW 15.9 (H) 11.5 - 15.5 %   Platelets 156 150 - 400 K/uL   nRBC 0.0 0.0 - 0.2 %    ASSESSMENT: Dana [name] is a 82 y.o. female, 2 Days Post-Op s/p OPEN REDUCTION INTERNAL FIXATION left  CV/Blood loss: Acute blood loss anemia, Hgb 7.1 this morning. 1 unit PRBCs ordered per primary team Hemodynamically stable  PLAN: Weightbearing: TDWB LLE ROM: Okay for unrestricted range of motion of the knee Incisional and dressing care: Removed today. Change PRN Showering: Okay to begin showering getting incisions wet 01/12/2023 Orthopedic device(s): None  Pain management:  1. Tylenol  325-650 mg q 6 hours PRN 2. Robaxin 500 mg q 6 hours PRN 3. Norco 5-325 or 7.5-325 mg q 4 hours PRN moderate pain/severe 4. Dilaudid 0.5 mg q 2 hours PRN VTE prophylaxis: Lovenox, SCDs ID:  Ancef 2gm post op completed Foley/Lines:  No foley, KVO IVFs Impediments to Fracture Healing: Vitamin D level 76, no additional supplementation needed  Dispo: PT/OT recommending SNF.  Continue to monitor CBC. Okay for discharge from ortho standpoint once cleared by medicine team and therapies  D/C recommendations: -Norco 5-325 for pain control -Eliquis 2.5 mg twice daily x 30 days for DVT prophylaxis -No additional Vit D supplementation needed  Follow - up plan: 2 weeks after discharge for wound check and repeat x-rays   Contact information:  Katha Hamming MD, Rushie Nyhan PA-C. After hours and holidays please check [email or web address] for group call information for [facility]   Gwinda Passe, PA-C [phone] (office) [email or web address]

## 2023-01-11 NOTE — Discharge Instructions (Addendum)
Orthopaedic Trauma Service Discharge Instructions   General Discharge Instructions  WEIGHT BEARING STATUS:touchdown weightbearing left leg  RANGE OF MOTION/ACTIVITY: Ok for knee range of motion as tolerated  Wound Care:  Incisions can be left open to air if there is no drainage. Once the incision is completely dry and without drainage, it may be left open to air out.  Showering may begin post-op day #2, (Thursday 01/12/23).  Clean incision gently with soap and water.  DVT/PE prophylaxis:  Eliquis x 30 days  Diet: as you were eating previously.  Can use over the counter stool softeners and bowel preparations, such as Miralax, to help with bowel movements.  Narcotics can be constipating.  Be sure to drink plenty of fluids  PAIN MEDICATION USE AND EXPECTATIONS  You have likely been given narcotic medications to help control your pain.  After a traumatic event that results in an fracture (broken bone) with or without surgery, it is ok to use narcotic pain medications to help control one's pain.  We understand that everyone responds to pain differently and each individual patient will be evaluated on a regular basis for the continued need for narcotic medications. Ideally, narcotic medication use should last no more than 6-8 weeks (coinciding with fracture healing).   As a patient it is your responsibility as well to monitor narcotic medication use and report the amount and frequency you use these medications when you come to your office visit.   We would also advise that if you are using narcotic medications, you should take a dose prior to therapy to maximize you participation.  IF YOU ARE ON NARCOTIC MEDICATIONS IT IS NOT PERMISSIBLE TO OPERATE A MOTOR VEHICLE (MOTORCYCLE/CAR/TRUCK/MOPED) OR HEAVY MACHINERY DO NOT MIX NARCOTICS WITH OTHER CNS (CENTRAL NERVOUS SYSTEM) DEPRESSANTS SUCH AS ALCOHOL   STOP SMOKING OR USING NICOTINE PRODUCTS!!!!  As discussed nicotine severely impairs your body's  ability to heal surgical and traumatic wounds but also impairs bone healing.  Wounds and bone heal by forming microscopic blood vessels (angiogenesis) and nicotine is a vasoconstrictor (essentially, shrinks blood vessels).  Therefore, if vasoconstriction occurs to these microscopic blood vessels they essentially disappear and are unable to deliver necessary nutrients to the healing tissue.  This is one modifiable factor that you can do to dramatically increase your chances of healing your injury.    (This means no smoking, no nicotine gum, patches, etc)  DO NOT USE NONSTEROIDAL ANTI-INFLAMMATORY DRUGS (NSAID'S)  Using products such as Advil (ibuprofen), Aleve (naproxen), Motrin (ibuprofen) for additional pain control during fracture healing can delay and/or prevent the healing response.  If you would like to take over the counter (OTC) medication, Tylenol (acetaminophen) is ok.  However, some narcotic medications that are given for pain control contain acetaminophen as well. Therefore, you should not exceed more than 4000 mg of tylenol in a day if you do not have liver disease.  Also note that there are may OTC medicines, such as cold medicines and allergy medicines that my contain tylenol as well.  If you have any questions about medications and/or interactions please ask your doctor/PA or your pharmacist.      ICE AND ELEVATE INJURED/OPERATIVE EXTREMITY  Using ice and elevating the injured extremity above your heart can help with swelling and pain control.  Icing in a pulsatile fashion, such as 20 minutes on and 20 minutes off, can be followed.    Do not place ice directly on skin. Make sure there is a barrier between  to skin and the ice pack.    Using frozen items such as frozen peas works well as the conform nicely to the are that needs to be iced.  USE AN ACE WRAP OR TED HOSE FOR SWELLING CONTROL  In addition to icing and elevation, Ace wraps or TED hose are used to help limit and resolve swelling.   It is recommended to use Ace wraps or TED hose until you are informed to stop.    When using Ace Wraps start the wrapping distally (farthest away from the body) and wrap proximally (closer to the body)   Example: If you had surgery on your leg or thing and you do not have a splint on, start the ace wrap at the toes and work your way up to the thigh        If you had surgery on your upper extremity and do not have a splint on, start the ace wrap at your fingers and work your way up to the upper arm   [place]: [phone]   VISIT OUR WEBSITE FOR ADDITIONAL INFORMATION: [email or web address]    Discharge Wound Care Instructions  Do NOT apply any ointments, solutions or lotions to pin sites or surgical wounds.  These prevent needed drainage and even though solutions like hydrogen peroxide kill bacteria, they also damage cells lining the pin sites that help fight infection.  Applying lotions or ointments can keep the wounds moist and can cause them to breakdown and open up as well. This can increase the risk for infection. When in doubt call the office.   If any drainage is noted, use one layer of adaptic or Mepitel, then gauze, Kerlix, and an ace wrap. - These dressing supplies should be available at local medical supply stores [facility], [facility], etc) as well as Management consultant ([employer], [facility], [place], etc)  Once the incision is completely dry and without drainage, it may be left open to air out.  Showering may begin 36-48 hours later.  Cleaning gently with soap and water.   Information on my medicine - ELIQUIS (apixaban)  This medication education was reviewed with me or my healthcare representative as part of my discharge preparation.    Why was Eliquis prescribed for you? Eliquis was prescribed for you to reduce the risk of blood clots forming after orthopedic surgery.    What do You need to know about Eliquis? Take your Eliquis  TWICE DAILY - one tablet in the morning and one tablet in the evening with or without food.  It would be best to take the dose about the same time each day.  If you have difficulty swallowing the tablet whole please discuss with your pharmacist how to take the medication safely.  Take Eliquis exactly as prescribed by your doctor and DO NOT stop taking Eliquis without talking to the doctor who prescribed the medication.  Stopping without other medication to take the place of Eliquis may increase your risk of developing a clot.  After discharge, you should have regular check-up appointments with your healthcare provider that is prescribing your Eliquis.  What do you do if you miss a dose? If a dose of ELIQUIS is not taken at the scheduled time, take it as soon as possible on the same day and twice-daily administration should be resumed.  The dose should not be doubled to make up for a missed dose.  Do not take more than one tablet of ELIQUIS at  the same time.  Important Safety Information A possible side effect of Eliquis is bleeding. You should call your healthcare provider right away if you experience any of the following: Bleeding from an injury or your nose that does not stop. Unusual colored urine (red or dark brown) or unusual colored stools (red or black). Unusual bruising for unknown reasons. A serious fall or if you hit your head (even if there is no bleeding).  Some medicines may interact with Eliquis and might increase your risk of bleeding or clotting while on Eliquis. To help avoid this, consult your healthcare provider or pharmacist prior to using any new prescription or non-prescription medications, including herbals, vitamins, non-steroidal anti-inflammatory drugs (NSAIDs) and supplements.  This website has more information on Eliquis (apixaban): http://www.eliquis.com/eliquis/home

## 2023-01-11 NOTE — TOC Progression Note (Signed)
Transition of Care [facility]) - Progression Note    Patient Details  Name: Dellia Runck MRN: [id number] Date of Birth: [birth year]  Transition of Care [facility]) CM/SW [place], [place] Phone Number: 01/11/2023, 12:03 PM  Clinical Narrative:     CSW met with pt and dtr at bedside. SNF bed offers were provided and reviewed. They will research options and let CSW know which facility they chose so auth can be started. They are agreeable to PTAR transport. TOC will continue to follow for DC needs.   Expected Discharge Plan: [place] Barriers to Discharge: Continued Medical Work up, SNF Pending bed offer  Expected Discharge Plan and Services In-house Referral: Clinical Social Work   Post Acute Care Choice: [employer] Living arrangements for the past 2 months: Single Family Home                                       Social Determinants of Health (SDOH) Interventions SDOH Screenings   Food Insecurity: No Food Insecurity (01/08/2023)  Housing: Low Risk  (01/08/2023)  Transportation Needs: No Transportation Needs (01/08/2023)  Utilities: Not At Risk (01/08/2023)  Depression (PHQ2-9): Low Risk  (07/12/2022)  Tobacco Use: Medium Risk (01/10/2023)    Readmission Risk Interventions     No data to display

## 2023-01-12 DIAGNOSIS — S728X2A Other fracture of left femur, initial encounter for closed fracture: Secondary | ICD-10-CM | POA: Diagnosis not present

## 2023-01-12 LAB — CBC
HCT: 25 % — ABNORMAL LOW (ref 36.0–46.0)
Hemoglobin: 8.4 g/dL — ABNORMAL LOW (ref 12.0–15.0)
MCH: 32.4 pg (ref 26.0–34.0)
MCHC: 33.6 g/dL (ref 30.0–36.0)
MCV: 96.5 fL (ref 80.0–100.0)
Platelets: 155 10*3/uL (ref 150–400)
RBC: 2.59 MIL/uL — ABNORMAL LOW (ref 3.87–5.11)
RDW: 17.3 % — ABNORMAL HIGH (ref 11.5–15.5)
WBC: 7.4 10*3/uL (ref 4.0–10.5)
nRBC: 0 % (ref 0.0–0.2)

## 2023-01-12 LAB — TYPE AND SCREEN
ABO/RH(D): O POS
Antibody Screen: NEGATIVE
Unit division: 0

## 2023-01-12 LAB — BPAM RBC
Blood Product Expiration Date: 202403222359
ISSUE DATE / TIME: 202402211105
Unit Type and Rh: 5100

## 2023-01-12 MED ORDER — VITAMIN B-12 1000 MCG PO TABS
1000.0000 ug | ORAL_TABLET | Freq: Every day | ORAL | Status: DC
  Administered 2023-01-12 – 2023-01-13 (×2): 1000 ug via ORAL
  Filled 2023-01-12 (×2): qty 1

## 2023-01-12 MED ORDER — SODIUM CHLORIDE 0.9 % IV BOLUS
500.0000 mL | Freq: Once | INTRAVENOUS | Status: AC
  Administered 2023-01-12: 500 mL via INTRAVENOUS

## 2023-01-12 MED ORDER — SODIUM CHLORIDE 0.9 % IV SOLN
250.0000 mg | Freq: Every day | INTRAVENOUS | Status: AC
  Administered 2023-01-12 – 2023-01-13 (×2): 250 mg via INTRAVENOUS
  Filled 2023-01-12 (×2): qty 20

## 2023-01-12 NOTE — Progress Notes (Signed)
Physical Therapy Treatment Patient Details Name: Dana [name] MRN: [id number] DOB: 1941/07/27 Today's Date: 01/12/2023   History of Present Illness Dana [name] is a 82 y.o. female  who presented to [facility] ED on 2/18 with complaints of left knee pain.  Patient was at home sitting in a chair in which she slid down and hit the back of her head and fell causing severe knee pain with inability to ambulate; sustained a L tibial plateau fx, now s/p ORIF, TWB, no knee ROM restrictions; with past medical history significant for HTN, HLD, prediabetes, vitamin D deficiency, rheumatoid arthritis, anemia, osteopenia, aortic atherosclerosis    PT Comments    Pt is progressing towards goals. Overall decreased physical assistance required for bed mobility and sit to stand this session at [place]. Pt continues at Mod A for stand pivot transfer and is unable to progress gait. Pt lacks the UE strength to clear the RLE from the floor while maintaining WB precautions. Due to pt current functional status, home set up, available assistance at home and current level of function recommending skilled physical therapy services in higher level of care on discharge from acute care hospital setting in order to decrease risk for falls, immobility, injury and re-hospitalization. Pt demonstrates no cardiac/respiratory distress throughout session.     Recommendations for follow up therapy are one component of a multi-disciplinary discharge planning process, led by the attending physician.  Recommendations may be updated based on patient status, additional functional criteria and insurance authorization.  Follow Up Recommendations  Skilled nursing-short term rehab (<3 hours/day) Can patient physically be transported by private vehicle: No   Assistance Recommended at Discharge Intermittent Supervision/Assistance  Patient can return home with the following Assistance with cooking/housework;Assist for transportation;Help  with stairs or ramp for entrance;A lot of help with walking and/or transfers   Equipment Recommendations  None recommended by PT    Recommendations for Other Services       Precautions / Restrictions Precautions Precautions: Fall Restrictions Weight Bearing Restrictions: Yes LLE Weight Bearing: Touchdown weight bearing Other Position/Activity Restrictions: improved adherence to WB precautions today     Mobility  Bed Mobility Overal bed mobility: Needs Assistance Bed Mobility: Supine to Sit     Supine to sit: Min assist     General bed mobility comments: Pt was Min A to get to EOB with asssitance at the LLE and then tactile cues at the trunk to get to fully sitting  upright. Patient Response: Cooperative  Transfers Overall transfer level: Needs assistance Equipment used: Rolling walker (2 wheels) Transfers: Sit to/from Stand, Bed to chair/wheelchair/BSC Sit to Stand: Min assist Stand pivot transfers: Mod assist         General transfer comment: Pt was Min A from EOB to get to standing with good adherence to WB precautions using PT foot under pt foot to ensure adherence. Pt then pivoted on R forefoot using RW with Mod A for balance, managing AD. Pt had 1x greater than 50% WB on the LLE; PT foot was put back under pt foot and pt then did very well adhering to precautions.    Ambulation/Gait               General Gait Details: Pt is unable at this time due to current functional status.       Balance Overall balance assessment: Needs assistance   Sitting balance-Leahy Scale: Good Sitting balance - Comments: no LOB with sitting EOB   Standing balance support: Reliant  on assistive device for balance, Bilateral upper extremity supported Standing balance-Leahy Scale: Fair Standing balance comment: Pt was able to stand with bil UE support and heavy reliance on AD to maintain balance without any overt LOB any direction.        Cognition Arousal/Alertness:  Awake/alert Behavior During Therapy: WFL for tasks assessed/performed Overall Cognitive Status: Within Functional Limits for tasks assessed           General Comments General comments (skin integrity, edema, etc.): pt lacks the UE strength to clear the RLE from the floor to perform step pivot.      Pertinent Vitals/Pain Pain Assessment Pain Assessment: Faces Faces Pain Scale: Hurts little more Pain Location: L knee/thigh Pain Descriptors / Indicators: Grimacing, Guarding Pain Intervention(s): Monitored during session     PT Goals (current goals can now be found in the care plan section) Acute Rehab PT Goals Patient Stated Goal: Be able to stand on LLE PT Goal Formulation: With patient Time For Goal Achievement: 01/24/23 Potential to Achieve Goals: Good Progress towards PT goals: Progressing toward goals    Frequency    Min 3X/week      PT Plan Current plan remains appropriate       AM-PAC PT "6 Clicks" Mobility   Outcome Measure  Help needed turning from your back to your side while in a flat bed without using bedrails?: A Little Help needed moving from lying on your back to sitting on the side of a flat bed without using bedrails?: A Little Help needed moving to and from a bed to a chair (including a wheelchair)?: A Lot Help needed standing up from a chair using your arms (e.g., wheelchair or bedside chair)?: A Little Help needed to walk in hospital room?: Total Help needed climbing 3-5 steps with a railing? : Total 6 Click Score: 13    End of Session Equipment Utilized During Treatment: Gait belt Activity Tolerance: Patient tolerated treatment well Patient left: in chair;with call bell/phone within reach;with chair alarm set Nurse Communication: Mobility status PT Visit Diagnosis: Unsteadiness on feet (R26.81);Other abnormalities of gait and mobility (R26.89);History of falling (Z91.81);Pain Pain - Right/Left: Left Pain - part of body: Knee;Leg      Time: [id number] PT Time Calculation (min) (ACUTE ONLY): 16 min  Charges:  $Therapeutic Activity: 8-22 mins                    Tomma Rakers, DPT, CLT  Acute Rehabilitation Services Office: [phone] (Secure chat preferred)    Ander Purpura 01/12/2023, 2:25 PM

## 2023-01-12 NOTE — Progress Notes (Signed)
   01/11/23 2353  Vitals  Temp 98.7 F (37.1 C)  Temp Source Oral  BP (!) 108/46  MAP (mmHg) (!) 64  BP Location Left Arm  BP Method Automatic  Patient Position (if appropriate) Lying  Pulse Rate 80  Pulse Rate Source Monitor  Resp 18  Provider Notification  Provider Name/Title Miles Costain  Date Provider Notified 01/11/23  Time Provider Notified 2353  Method of Notification Page (secure chat)  Notification Reason Other (Comment) (Low blood pressure held Propranolol)  Provider response See new orders  Date of Provider Response 01/11/23  Time of Provider Response 0040

## 2023-01-12 NOTE — Progress Notes (Signed)
   01/12/23 1000  Mobility  Activity Moved into chair position in bed  Level of Assistance Minimal assist, patient does 75% or more  Activity Response Tolerated fair  Mobility Referral Yes  $Mobility charge 1 Mobility   Mobility Specialist Progress Note  Pt was in bed and agreeable. Deferred further ambulation d/t wanting to finish her breakfast. Left w/ all needs met and call bell in reach. Will f/u  Lucious Groves Mobility Specialist  Please contact via [place] or Rehab office at [phone]

## 2023-01-12 NOTE — TOC Progression Note (Signed)
Transition of Care [facility]) - Progression Note    Patient Details  Name: Dana [name] MRN: [id number] Date of Birth: May 09, 1941  Transition of Care [facility]) CM/SW Contact  Coralee [place], [place] Phone Number: 01/12/2023, 9:55 AM  Clinical Narrative:    CSW notified that pt will DC tomorrow. CSW confirmed bed with chosen facility, [facility], they are agreeable to admit tomorrow. Insurance approved for SNF. CSW updated family to plan. TOC will continue to follow for DC needs.    Expected Discharge Plan: [place] Barriers to Discharge: Continued Medical Work up, SNF Pending bed offer  Expected Discharge Plan and Services In-house Referral: Clinical Social Work   Post Acute Care Choice: [place] Living arrangements for the past 2 months: Single Family Home                                       Social Determinants of Health (SDOH) Interventions SDOH Screenings   Food Insecurity: No Food Insecurity (01/08/2023)  Housing: Low Risk  (01/08/2023)  Transportation Needs: No Transportation Needs (01/08/2023)  Utilities: Not At Risk (01/08/2023)  Depression (PHQ2-9): Low Risk  (07/12/2022)  Tobacco Use: Medium Risk (01/10/2023)    Readmission Risk Interventions     No data to display

## 2023-01-12 NOTE — Progress Notes (Signed)
PROGRESS NOTE    Dana [name]  [id number] DOB: 03/17/1941 DOA: 01/08/2023 PCP: Unk Pinto, MD    Brief Narrative:   Dana [name] is a 82 y.o. female with past medical history significant for HTN, HLD, prediabetes, vitamin D deficiency, rheumatoid arthritis, anemia, osteopenia, aortic atherosclerosis who presented to [facility] ED on 2/18 with complaints of left knee pain.  [name]  Fall was witnessed by her daughter.  In the ED, WBC 11.0, hemoglobin 10.9, platelets 200.  Sodium 141, potassium 3.9, chloride 103, CO2 28, glucose 96, BUN 12, creatinine 0.67.  AST 26, ALT 14, total bilirubin 0.8.  High sensitive troponin 45>56.  INR 1.1.  TSH 4.148.  Urinalysis unrevealing.  CT head/C-spine with no acute intracranial pathology, small vessel white matter disease, no fracture or subluxation of the C-spine.  Chest x-ray with no acute abnormality.  Left knee x-ray with comminuted intra-articular fractures involving the bilateral femoral condyles with impaction and varus angulation, likely hemarthrosis with diffuse soft tissue swelling.  Orthopedics was consulted.  TRH was consulted for further evaluation management of left femur fracture fracture.  Assessment & Plan:   Left distal femur fracture s/p ORIF Patient presenting to ED with severe left lower extremity pain with inability to ambulate following fall at home.  Imaging notable for comminuted displaced intra-articular distal femoral fracture with impaction and angulation, nondisplaced fracture anterior aspect lateral tibial plateau, tiny fracture upper tip of patella, diffuse osteopenia, tiny area AVN anterior aspect of the lateral femoral condyle.  Orthopedics was consulted and patient underwent ORIF left supracondylar distal femur fracture by Dr. Doreatha Martin on 01/09/2023 -- Orthopedics following,  appreciate assistance -- TDWB LLE -- Norco q6h PRN moderate pain -- Robaxin every 6 hours as needed muscle spasms -- Dilaudid 0.5 mg IV q2h PRN severe pain -- Currently on Lovenox for DVT prophylaxis, plan Eliquis 2.5 mg twice daily x 30 days on discharge -- Vitamin D 25 hydroxy level: 76 -- PT/OT evaluation: Pending -- Outpatient follow-up with orthopedics, Dr. Doreatha Martin 2 weeks for postoperative check, x-rays  Elevated troponin High sensitive troponin elevated 45 followed by 56.  EKG with NSR, no concerning dynamic changes.  Patient denies chest pain.  TTE with LVEF 65-70%, no LV regional wall motion normalities, mild LVH, LA moderately dilated, mild MR, mild aortic valve stenosis, IVC normal in size.  Etiology likely secondary to type II demand ischemia in the setting of femur fracture as above. -- Continue monitor on telemetry  Postoperative blood loss anemia Iron/B12 deficiency anemia Hemoglobin 10.9 on admission, trended down to 8.1 postoperatively.  Etiology likely secondary to acute blood loss anemia secondary to long bone fracture.  Anemia panel with iron 18, TIBC 214, ferritin 48, folate 15.6, vitamin B12 162. -- Hgb 10.9>9.0>8.1>7.1>8.4 -- s/p 1 u pRBC on 2/21 -- IV iron x 2 -- Start B12 supplementation -- CBC in am  Essential hypertension -- Propranolol 80 mg p.o. twice daily   Hypothyroidism TSH 4.148, within normal limits. --Continue levothyroxine 100 mcg on S/T/W/T/S and 53mg on M/F  Rheumatoid arthritis On methotrexate, Plaquenil.  Hx tremor -- Diazepam 5 mg -- Propranolol   DVT prophylaxis: enoxaparin (LOVENOX) injection 40 mg Start: 01/10/23 0800 SCDs Start: 01/09/23 1722 SCDs Start: 01/08/23 2104    Code Status: Full Code Family Communication: No family present at bedside this morning,  updated daughter present at bedside yesterday afternoon  Disposition Plan:  Level of care: Med-Surg Status is: Inpatient Remains inpatient appropriate because: IV iron  today and tomorrow morning, then plan discharge to [facility]    Consultants:  Orthopedics, Dr. Doreatha Martin  Procedures:  ORIF left supracondylar distal femur fracture by Dr. Doreatha Martin; 01/09/2023  Antimicrobials:  None   Subjective: Patient seen examined bedside, lying in bed.  Just finished eating breakfast.  No family present.  Hemoglobin improved.  Anemia panel consistent with iron and B12 deficiency will start treatment today.  Slightly anxious.  Has been accepted at [facility] with planned discharge tomorrow.  Denies headache, no dizziness, no chest pain, no palpitations, no shortness of breath, no abdominal pain, no focal weakness, no fever/chills/night sweats, no nausea/vomiting/diarrhea, no paresthesias.  No acute events overnight per nursing staff.  Objective: Vitals:   01/12/23 0110 01/12/23 0413 01/12/23 0646 01/12/23 0907  BP: (!) 98/40 (!) 116/49 113/70 113/69  Pulse: 67 72 74 74  Resp:  18  18  Temp:  98.2 F (36.8 C)  98.1 F (36.7 C)  TempSrc:  Oral  Oral  SpO2:  100%  100%  Weight:      Height:        Intake/Output Summary (Last 24 hours) at 01/12/2023 1110 Last data filed at 01/12/2023 1040 Gross per 24 hour  Intake 600 ml  Output 300 ml  Net 300 ml   Filed Weights   01/08/23 1447 01/09/23 1241  Weight: 54.4 kg 54.4 kg    Examination:  Physical Exam: GEN: NAD, alert and oriented x 3, wd/wn, elderly in appearance HEENT: NCAT, PERRL, EOMI, sclera clear, MMM PULM: CTAB w/o wheezes/crackles, normal respiratory effort, on room air CV: RRR w/o M/G/R GI: abd soft, NTND, NABS, no R/G/M MSK: Slight edema surrounding left knee surgical site, moves all extremities independently, surgical site noted as below NEURO: No focal deficits PSYCH: normal mood/affect Integumentary: Surgical site noted now without dressing with some mild edema and ecchymosis, no other concerning rashes/lesions/wounds noted on exposed skin surfaces.     Data Reviewed: I have  personally reviewed following labs and imaging studies  CBC: Recent Labs  Lab 01/08/23 1628 01/09/23 1822 01/10/23 0012 01/11/23 0403 01/12/23 0158  WBC 11.0* 9.3 7.9 7.0 7.4  NEUTROABS 9.7*  --   --   --   --   HGB 10.9* 9.0* 8.1* 7.1* 8.4*  HCT 34.3* 27.4* 23.9* 22.4* 25.0*  MCV 101.2* 99.3 98.8 103.2* 96.5  PLT 200 165 147* 156 [id number]   Basic Metabolic Panel: Recent Labs  Lab 01/08/23 1628 01/08/23 1722 01/09/23 1822 01/10/23 0012  NA 141  --   --  137  K 3.9  --   --  3.8  CL 103  --   --  101  CO2 28  --   --  27  GLUCOSE 96  --   --  149*  BUN 12  --   --  15  CREATININE 0.67  --  0.76 0.82  CALCIUM 8.4*  --   --  8.0*  MG  --  2.2  --  2.1  PHOS  --  2.8  --   --    GFR: Estimated Creatinine Clearance: 41.6 mL/min (by C-G formula based on SCr of 0.82 mg/dL). Liver Function Tests: Recent Labs  Lab 01/08/23 1722  AST 26  ALT 14  ALKPHOS 70  BILITOT 1.0  PROT 5.8*  ALBUMIN 3.4*  No results for input(s): "LIPASE", "AMYLASE" in the last 168 hours. No results for input(s): "AMMONIA" in the last 168 hours. Coagulation Profile: Recent Labs  Lab 01/08/23 1722  INR 1.1   Cardiac Enzymes: No results for input(s): "CKTOTAL", "CKMB", "CKMBINDEX", "TROPONINI" in the last 168 hours. BNP (last 3 results) No results for input(s): "PROBNP" in the last 8760 hours. HbA1C: No results for input(s): "HGBA1C" in the last 72 hours. CBG: No results for input(s): "GLUCAP" in the last 168 hours. Lipid Profile: No results for input(s): "CHOL", "HDL", "LDLCALC", "TRIG", "CHOLHDL", "LDLDIRECT" in the last 72 hours. Thyroid Function Tests: No results for input(s): "TSH", "T4TOTAL", "FREET4", "T3FREE", "THYROIDAB" in the last 72 hours.  Anemia Panel: Recent Labs    01/11/23 1133  VITAMINB12 162*  FOLATE 15.6  FERRITIN 48  TIBC 214*  IRON 18*  RETICCTPCT 3.6*   Sepsis Labs: No results for input(s): "PROCALCITON", "LATICACIDVEN" in the last 168 hours.  Recent  Results (from the past 240 hour(s))  Surgical PCR screen     Status: None   Collection Time: 01/08/23  9:38 PM   Specimen: Nasal Mucosa; Nasal Swab  Result Value Ref Range Status   MRSA, PCR NEGATIVE NEGATIVE Final   Staphylococcus aureus NEGATIVE NEGATIVE Final    Comment: (NOTE) The Xpert SA Assay (FDA approved for NASAL specimens in patients 72 years of age and older), is one component of a comprehensive surveillance program. It is not intended to diagnose infection nor to guide or monitor treatment. Performed at [facility], [place] [address] [place], [place] [address]          Radiology Studies: No results found.      Scheduled Meds:  docusate sodium  100 mg Oral BID   enoxaparin (LOVENOX) injection  40 mg Subcutaneous Q24H   fluticasone  2 spray Each Nare Daily   levothyroxine  100 mcg Oral Once per day on Sun Tue Wed Thu Sat   levothyroxine  50 mcg Oral Once per day on Mon Fri   loratadine  10 mg Oral Daily   montelukast  10 mg Oral QHS   multivitamin with minerals  1 tablet Oral Daily   mupirocin ointment  1 Application Nasal BID   propranolol  80 mg Oral BID   Continuous Infusions:  sodium chloride 50 mL/hr at 01/09/23 1745   methocarbamol (ROBAXIN) IV       LOS: 4 days    Time spent: 48 minutes spent on chart review, discussion with nursing staff, consultants, updating family and interview/physical exam; more than 50% of that time was spent in counseling and/or coordination of care.    Anisten Tomassi J [place] DO [facility] Available via Epic secure chat 7am-7pm After these hours, please refer to coverage provider listed on [email or web address] 01/12/2023, 11:10 AM

## 2023-01-12 NOTE — Progress Notes (Signed)
       CROSS COVER NOTE  NAME: Dana [name] MRN: [id number] DOB : [birth year]    Date of Service   01/12/2023     HPI/Events of Note   0130- hypotensive episode, 98/40 (40).  RN denies bleeding at surgical site.   Propranolol held.  Oral Norco given tonight x 1.   Interventions/ Plan   500 cc bolus ordered -->  BP improved  CBC ordered to check hemoglobin--> 8.4       Raenette Rover, DNP, [place]

## 2023-01-13 DIAGNOSIS — R1310 Dysphagia, unspecified: Secondary | ICD-10-CM | POA: Diagnosis not present

## 2023-01-13 DIAGNOSIS — R531 Weakness: Secondary | ICD-10-CM | POA: Diagnosis not present

## 2023-01-13 DIAGNOSIS — M6281 Muscle weakness (generalized): Secondary | ICD-10-CM | POA: Diagnosis not present

## 2023-01-13 DIAGNOSIS — Z7401 Bed confinement status: Secondary | ICD-10-CM | POA: Diagnosis not present

## 2023-01-13 DIAGNOSIS — R6889 Other general symptoms and signs: Secondary | ICD-10-CM | POA: Diagnosis not present

## 2023-01-13 DIAGNOSIS — S82302A Unspecified fracture of lower end of left tibia, initial encounter for closed fracture: Secondary | ICD-10-CM | POA: Diagnosis not present

## 2023-01-13 DIAGNOSIS — Z743 Need for continuous supervision: Secondary | ICD-10-CM | POA: Diagnosis not present

## 2023-01-13 DIAGNOSIS — S72492D Other fracture of lower end of left femur, subsequent encounter for closed fracture with routine healing: Secondary | ICD-10-CM | POA: Diagnosis not present

## 2023-01-13 DIAGNOSIS — Z9181 History of falling: Secondary | ICD-10-CM | POA: Diagnosis not present

## 2023-01-13 DIAGNOSIS — R269 Unspecified abnormalities of gait and mobility: Secondary | ICD-10-CM | POA: Diagnosis not present

## 2023-01-13 LAB — BASIC METABOLIC PANEL
Anion gap: 5 (ref 5–15)
BUN: 10 mg/dL (ref 8–23)
CO2: 28 mmol/L (ref 22–32)
Calcium: 7.8 mg/dL — ABNORMAL LOW (ref 8.9–10.3)
Chloride: 106 mmol/L (ref 98–111)
Creatinine, Ser: 0.54 mg/dL (ref 0.44–1.00)
GFR, Estimated: >60 mL/min (ref >60)
Glucose, Bld: 101 mg/dL — ABNORMAL HIGH (ref 70–99)
Potassium: 3.5 mmol/L (ref 3.5–5.1)
Sodium: 139 mmol/L (ref 135–145)

## 2023-01-13 LAB — CBC
HCT: 26.2 % — ABNORMAL LOW (ref 36.0–46.0)
Hemoglobin: 8.7 g/dL — ABNORMAL LOW (ref 12.0–15.0)
MCH: 32.5 pg (ref 26.0–34.0)
MCHC: 33.2 g/dL (ref 30.0–36.0)
MCV: 97.8 fL (ref 80.0–100.0)
Platelets: 208 10*3/uL (ref 150–400)
RBC: 2.68 MIL/uL — ABNORMAL LOW (ref 3.87–5.11)
RDW: 17.3 % — ABNORMAL HIGH (ref 11.5–15.5)
WBC: 7.5 10*3/uL (ref 4.0–10.5)
nRBC: 0 % (ref 0.0–0.2)

## 2023-01-13 LAB — VITAMIN D 25 HYDROXY (VIT D DEFICIENCY, FRACTURES)

## 2023-01-13 MED ORDER — CLOTRIMAZOLE 1 % EX CREA
TOPICAL_CREAM | Freq: Two times a day (BID) | CUTANEOUS | Status: DC
  Filled 2023-01-13: qty 15

## 2023-01-13 MED ORDER — CLOTRIMAZOLE 1 % EX CREA: TOPICAL_CREAM | Freq: Two times a day (BID) | CUTANEOUS | 0 refills | Status: AC

## 2023-01-13 MED ORDER — DIAZEPAM 5 MG PO TABS: 5.0000 mg | ORAL_TABLET | Freq: Two times a day (BID) | ORAL | 0 refills | Status: DC | PRN

## 2023-01-13 MED ORDER — METHOTREXATE 2.5 MG PO TABS: 10.0000 mg | ORAL_TABLET | ORAL | 0 refills | Status: AC

## 2023-01-13 MED ORDER — DOCUSATE SODIUM 100 MG PO CAPS: 100.0000 mg | ORAL_CAPSULE | Freq: Two times a day (BID) | ORAL | 0 refills | Status: DC

## 2023-01-13 NOTE — Progress Notes (Signed)
Physical Therapy Treatment Patient Details Name: Dana [name] MRN: [id number] DOB: 1941-05-19 Today's Date: 01/13/2023   History of Present Illness Dana [name] is a 82 y.o. female  who presented to [facility] ED on 2/18 with complaints of left knee pain.  Patient was at home sitting in a chair in which she slid down and hit the back of her head and fell causing severe knee pain with inability to ambulate; sustained a L tibial plateau fx, now s/p ORIF, TWB, no knee ROM restrictions; with past medical history significant for HTN, HLD, prediabetes, vitamin D deficiency, rheumatoid arthritis, anemia, osteopenia, aortic atherosclerosis    PT Comments    Pt continues to require increased physical assistance for tranfers at Mod A with improved adherence to WB precautions this session without any WB through the LLE for 2 transfers. Pt was CGA for bed mobility this session but needs cueing to remember that WB in bed through LLE is still WB. Due to pt current functional status, home set up, available assistance at home and PLOF recommending skilled physical therapy services at higher level of care on discharge from acute care hospital setting in order to decrease risk for falls, immobility, skin break down and re-hospitalization. No signs/symptoms of cardiac/respiratory distress throughout session.     Recommendations for follow up therapy are one component of a multi-disciplinary discharge planning process, led by the attending physician.  Recommendations may be updated based on patient status, additional functional criteria and insurance authorization.  Follow Up Recommendations  Skilled nursing-short term rehab (<3 hours/day) Can patient physically be transported by private vehicle: No   Assistance Recommended at Discharge Intermittent Supervision/Assistance  Patient can return home with the following Assistance with cooking/housework;Assist for transportation;Help with stairs or ramp for  entrance;A lot of help with walking and/or transfers   Equipment Recommendations  None recommended by PT    Recommendations for Other Services       Precautions / Restrictions Precautions Precautions: Fall Restrictions Weight Bearing Restrictions: Yes LLE Weight Bearing: Touchdown weight bearing Other Position/Activity Restrictions: Good adherence to WB precautions today     Mobility  Bed Mobility Overal bed mobility: Needs Assistance Bed Mobility: Sit to Supine     Supine to sit: Min guard     General bed mobility comments: Pt was able to get bil LE into the bed without assistance today managing her upper body. She did require cuing to not bridge up with the LLE. Patient Response: Cooperative  Transfers Overall transfer level: Needs assistance Equipment used: Rolling walker (2 wheels) Transfers: Sit to/from Stand, Bed to chair/wheelchair/BSC Sit to Stand: Mod assist, Min assist Stand pivot transfers: Mod assist         General transfer comment: Pt was Mod A from recliner and Min A from [facility] for sit to stand to RW with good adherence to WB precautions with physical therapist foot under pt foot.    Ambulation/Gait               General Gait Details: Pt is unable at this time due to current functional status.       Balance Overall balance assessment: Needs assistance   Sitting balance-Leahy Scale: Good Sitting balance - Comments: no LOB with sitting EOB and sitting on BSC   Standing balance support: Reliant on assistive device for balance, Bilateral upper extremity supported Standing balance-Leahy Scale: Fair Standing balance comment: Pt was able to stand with bil UE support and heavy reliance on AD to maintain balance  without any overt LOB any direction for self care with wiping following voiding in [facility]          Cognition Arousal/Alertness: Awake/alert Behavior During Therapy: WFL for tasks assessed/performed Overall Cognitive Status: Within  Functional Limits for tasks assessed          Exercises      General Comments General comments (skin integrity, edema, etc.): Pt continues to lack the UE strength to clear the RLE from the floor to perform step pivot.      Pertinent Vitals/Pain Pain Assessment Pain Assessment: Faces Faces Pain Scale: Hurts a little bit Pain Location: L knee/thigh Pain Descriptors / Indicators: Sore, Operative site guarding Pain Intervention(s): Monitored during session     PT Goals (current goals can now be found in the care plan section) Acute Rehab PT Goals Patient Stated Goal: Be able to stand on LLE PT Goal Formulation: With patient Time For Goal Achievement: 01/24/23 Potential to Achieve Goals: Good Progress towards PT goals: Progressing toward goals    Frequency    Min 3X/week      PT Plan Current plan remains appropriate       AM-PAC PT "6 Clicks" Mobility   Outcome Measure  Help needed turning from your back to your side while in a flat bed without using bedrails?: A Little Help needed moving from lying on your back to sitting on the side of a flat bed without using bedrails?: A Little Help needed moving to and from a bed to a chair (including a wheelchair)?: A Lot Help needed standing up from a chair using your arms (e.g., wheelchair or bedside chair)?: A Lot Help needed to walk in hospital room?: Total Help needed climbing 3-5 steps with a railing? : Total 6 Click Score: 12    End of Session Equipment Utilized During Treatment: Gait belt Activity Tolerance: Patient tolerated treatment well Patient left: with call bell/phone within reach;in bed;with bed alarm set Nurse Communication: Mobility status PT Visit Diagnosis: Unsteadiness on feet (R26.81);Other abnormalities of gait and mobility (R26.89);History of falling (Z91.81);Pain Pain - Right/Left: Left Pain - part of body: Knee;Leg     Time: 1204-1227 PT Time Calculation (min) (ACUTE ONLY): 23 min  Charges:   $Therapeutic Activity: 23-37 mins                    Tomma Rakers, DPT, CLT  Acute Rehabilitation Services Office: [phone] (Secure chat preferred)    Ander Purpura 01/13/2023, 1:40 PM

## 2023-01-13 NOTE — TOC Transition Note (Signed)
Transition of Care [facility]) - CM/SW Discharge Note   Patient Details  Name: Kristine Winget MRN: [id number] Date of Birth: August 11, 1941  Transition of Care [facility]) CM/SW Contact:  Coralee Pesa, [place] Phone Number: 01/13/2023, 11:33 AM   Clinical Narrative:    Pt to be transported to [facility] via [facility]. Nurse to call report to [phone]. Rm # [id number].   Final next level of care: Skilled Nursing Facility Barriers to Discharge: Barriers Resolved   Patient Goals and CMS Choice CMS Medicare.gov Compare Post Acute Care list provided to:: Patient Choice offered to / list presented to : Patient  Discharge Placement                Patient chooses bed at: [facility] Patient to be transferred to facility by: [place] Name of family member notified: Vinnie Level Patient and family notified of of transfer: 01/13/23  Discharge Plan and Services Additional resources added to the After Visit Summary for   In-house Referral: Clinical Social Work   Post Acute Care Choice: [place]                               Social Determinants of Health ([place]) Interventions SDOH Screenings   Food Insecurity: No Food Insecurity (01/08/2023)  Housing: Low Risk  (01/08/2023)  Transportation Needs: No Transportation Needs (01/08/2023)  Utilities: Not At Risk (01/08/2023)  Depression (PHQ2-9): Low Risk  (07/12/2022)  Tobacco Use: Medium Risk (01/10/2023)     Readmission Risk Interventions     No data to display

## 2023-01-13 NOTE — Discharge Summary (Signed)
Physician Discharge Summary   Patient: Dana [name] MRN: [id number] DOB: Apr 10, 1941  Admit date:     01/08/2023  Discharge date: 01/13/23  Discharge Physician: Berle Mull  PCP: Unk Pinto, MD  Recommendations at discharge: Follow up with PCP in 1 week with CBC  Follow up with Orthopedics as recommended   Follow-up Information     Haddix, Thomasene Lot, MD. Schedule an appointment as soon as possible for a visit in 2 week(s).   Specialty: Orthopedic Surgery Why: for wound check and repeat x-rays Contact information: [place] [address] [phone]         Unk Pinto, MD. Schedule an appointment as soon as possible for a visit in 1 week(s).   Specialty: Internal Medicine Why: with CBC and BMP Contact information: [phone] [place] [address] [phone]                 Unresulted Labs (From admission, onward)     Start     Ordered   01/16/23 0500  Creatinine, serum  (enoxaparin (LOVENOX)    CrCl >/= 30 ml/min)  Weekly,   R     Comments: while on enoxaparin therapy    01/09/23 1721   01/09/23 0500  VITAMIN D 25 Hydroxy (Vit-D Deficiency, Fractures)  Tomorrow morning,   R        01/08/23 2103             Discharge Diagnoses: Principal Problem:   Left tibial fracture Active Problems:   Rheumatoid arthritis ([place])   Hereditary essential tremor   Hypothyroidism   Chronic anemia   Elevated troponin   Disp supracondylar fx with intracondylar extension of distal femur [facility])  Hospital Course: Dana [name] is a 82 y.o. female with past medical history significant for HTN, HLD, prediabetes, vitamin D deficiency, rheumatoid arthritis, anemia, osteopenia, aortic atherosclerosis who presented to [facility] ED on 2/18 with complaints of left knee pain.  [name]   Orthopedics was consulted  Underwent ORIF on 01/09/2023 Received 1 PRBC for post op anemia  HH stable after that  Received 2 IV iron infusion before discharge.   Assessment and Plan  Left distal femur fracture s/p ORIF Patient presenting to ED with severe left lower extremity pain with inability to ambulate following fall at home.  Imaging notable for comminuted displaced intra-articular distal femoral fracture with impaction and angulation, nondisplaced fracture anterior aspect lateral tibial plateau, tiny fracture upper tip of patella, diffuse osteopenia, tiny area AVN anterior aspect of the lateral femoral condyle.  Orthopedics was consulted and patient underwent ORIF left supracondylar distal femur fracture by Dr. Doreatha Martin on 01/09/2023 TDWB LLE Per Orthopedics Eliquis 2.5 mg twice daily x 30 days on discharge Vitamin D 25 hydroxy level: 76 Outpatient follow-up with orthopedics, Dr. Doreatha Martin 2 weeks for postoperative check, x-rays   Elevated troponin High sensitive troponin elevated 45 followed by 56.  EKG with NSR, no concerning dynamic changes.  Patient denies chest pain.  TTE with LVEF 65-70%, no LV regional wall motion normalities, mild LVH, LA moderately dilated, mild MR, mild aortic valve stenosis, IVC normal in size.  Etiology likely secondary to type II demand ischemia in the setting of femur fracture as above. Outpt follow up with PCP in 1 week. May need Cardiology referral   Postoperative  blood loss anemia Iron/B12 deficiency anemia Hemoglobin 10.9 on admission, trended down to 8.1 postoperatively.  Etiology likely secondary to acute blood loss anemia secondary to long bone fracture.  Anemia panel with iron 18, TIBC 214, ferritin 48, folate 15.6, vitamin B12 162. s/p 1 u pRBC on 2/21 IV iron x 2   Essential hypertension Propranolol 80 mg p.o. twice daily    Hypothyroidism TSH 4.148, within normal limits. Continue levothyroxine 100 mcg on S/T/W/T/S and 20mg on M/F    Rheumatoid arthritis On methotrexate, Plaquenil. Continue plaquenil Resume methotrexate in march first week   Hx tremor Diazepam 5 mg, takes once daily, prescribed tid PRN Continue Propranolol bid   [place]. and patient was instructed, not to drive, operate heavy machinery, perform activities at heights, swimming or participation in water activities or provide baby-sitting services while on Pain, Sleep and Anxiety Medications; until their outpatient Physician has advised to do so again. Also recommended to not to take more than prescribed Pain, Sleep and Anxiety Medications.  Consultants:  Orthopedics   Procedures performed:  Left intra-articular supracondylar distal femur fracture   DISCHARGE MEDICATION: Allergies as of 01/13/2023       Reactions   Codeine Nausea Only   Mysoline [primidone] Nausea Only, Other (See Comments)   Over-sedated   Sulfa Antibiotics Itching        Medication List     STOP taking these medications    aspirin EC 81 MG tablet   terbinafine 250 MG tablet Commonly known as: LAMISIL       TAKE these medications    acetaminophen 325 MG tablet Commonly known as: Tylenol Take 2 tablets (650 mg total) by mouth every 6 (six) hours as needed. What changed: reasons to take this   alendronate 70 MG tablet Commonly known as: FOSAMAX Take 70 mg by mouth once a week. Take with a full glass of water on an empty stomach.   apixaban 2.5 MG Tabs tablet Commonly known as: Eliquis Take 1 tablet (2.5 mg total) by mouth 2 (two) times daily.   clotrimazole 1 % cream Commonly known as: LOTRIMIN Apply topically 2 (two) times daily for 14 days.   diazepam 5 MG tablet Commonly known as: VALIUM Take 1 tablet (5 mg total) by mouth every 12 (twelve) hours as needed (tremors). TAKE 1 TABLET THREE TIMES DAILY  FOR  TREMOR What changed:  how much to take how to take this when to  take this reasons to take this   dicyclomine 20 MG tablet Commonly known as: BENTYL TAKE 1 TABLET THREE TIMES DAILY BEFORE MEALS FOR CRAMPING, BLOATING, NAUSEA OR DIARRHEA What changed:  how much to take how to take this when to take this   docusate sodium 100 MG capsule Commonly known as: COLACE Take 1 capsule (100 mg total) by mouth 2 (two) times daily.   folic acid [id number] tablet Commonly known as: FOLVITE Take 1 tablet daily. What changed:  how much to take how to take this when to take this additional instructions   furosemide 40 MG tablet Commonly known as: LASIX TAKE 1 TABLET TWICE DAILY FOR FLUID RETENTION AND ANKLE  SWELLING What changed:  how much to take how to take this when to take this   HYDROcodone-acetaminophen 5-325 MG tablet Commonly known as: NORCO/VICODIN Take 1 tablet by mouth every 4 (four) hours as needed for severe pain.   hydrocortisone 2.5 % ointment Apply topically  2 (two) times daily.   hydroxychloroquine 200 MG tablet Commonly known as: PLAQUENIL Take 200 mg by mouth See admin instructions.   levothyroxine 100 MCG tablet Commonly known as: SYNTHROID Take 1/2 tablet on Mon and Friday and 1 whole tablet  Daily  on an empty stomach with only water for 30 minutes & no Antacid meds, Calcium or Magnesium for 4 hours & avoid Biotin   methotrexate 2.5 MG tablet Commonly known as: RHEUMATREX Take 4 tablets (10 mg total) by mouth See admin instructions. Take 4 tablets (10 mg) by mouth twice weekly - Tuesday and Wednesday Start taking on: January 24, 2023 What changed: These instructions start on January 24, 2023. If you are unsure what to do until then, ask your doctor or other care provider.   montelukast 10 MG tablet Commonly known as: SINGULAIR TAKE 1 TABLET EVERY DAY What changed:  how much to take how to take this when to take this   multivitamin with minerals Tabs tablet Take 1 tablet by mouth every evening.   ondansetron 8 MG  disintegrating tablet Commonly known as: Zofran ODT '8mg'$  ODT q4 hours prn nausea What changed:  how much to take how to take this when to take this reasons to take this additional instructions   polyethylene glycol 17 g packet Commonly known as: MIRALAX / GLYCOLAX Take 17 g by mouth daily.   propranolol 80 MG tablet Commonly known as: INDERAL TAKE 1 TABLET TWICE DAILY FOR BLOOD PRESSURE AND TREMORS What changed:  how much to take how to take this when to take this   triamcinolone 0.025 % ointment Commonly known as: KENALOG Apply 1 Application topically 2 (two) times daily.   VITAMIN B 12 PO Place 1,000 mcg under the tongue every evening.   Vitamin D 125 MCG (5000 UT) Caps Take 10,000 Units by mouth every evening.       Disposition: SNF Diet recommendation: Cardiac diet  Discharge Exam: Vitals:   01/12/23 2134 01/12/23 2212 01/13/23 0602 01/13/23 0759  BP: (!) 146/63 (!) 127/53 (!) 108/44 121/80  Pulse: 75 69 66 75  Resp: '17 18 18 16  '$ Temp: 98.5 F (36.9 C) 98.7 F (37.1 C) 97.8 F (36.6 C) 98.7 F (37.1 C)  TempSrc: Oral Oral Oral Oral  SpO2: 95% 100% 95% 94%  Weight:      Height:       General: Appear in mild distress; no visible Abnormal Neck Mass Or lumps, Conjunctiva normal Cardiovascular: S1 and S2 Present, no Murmur, Respiratory: good respiratory effort, Bilateral Air entry present and CTA, no Crackles, no wheezes Abdomen: Bowel Sound present, Non tender  Extremities: left Pedal edema, left groin redness Neurology: alert and oriented to time, place, and person  Filed Weights   01/08/23 1447 01/09/23 1241  Weight: 54.4 kg 54.4 kg   Condition at discharge: stable  The results of significant diagnostics from this hospitalization (including imaging, microbiology, ancillary and laboratory) are listed below for reference.   Imaging Studies: DG FEMUR MIN 2 VIEWS LEFT  Result Date: 01/09/2023 CLINICAL DATA:  Elective surgery. EXAM: LEFT FEMUR 2  VIEWS COMPARISON:  Preoperative imaging. FINDINGS: Six fluoroscopic spot views of the left femur obtained in the operating room. Lateral plate and multi screw fixation of distal femur fracture. Fluoroscopy time 37 seconds. Dose 1.61 mGy. IMPRESSION: Intraoperative fluoroscopy during distal femur fracture ORIF. Electronically Signed   By: Keith Rake M.D.   On: 01/09/2023 17:31   DG Knee Left Port  Result  Date: 01/09/2023 CLINICAL DATA:  Postoperative. EXAM: PORTABLE LEFT KNEE - 1-2 VIEW COMPARISON:  Left knee radiographs 01/08/2023, CT left knee 01/09/2023 FINDINGS: There is new lateral plate and screw fixation of the proximal femoral diaphysis through the femoral condyles, fixating the previously seen displaced comminuted lateral femoral condyle fracture. Note is again made that the fracture lines extend into a distal lateral femoral metaphysis and the medial and lateral physeal regions. There is improved alignment with mild approximate 3 mm cortical step-off at the junction of the distal lateral metaphysis and lateral femoral condyle. No evidence of hardware failure. Postoperative intra-articular and subcutaneous air. IMPRESSION: Status post ORIF of the previously seen comminuted and displaced lateral femoral condyle fracture. There is improved alignment. Electronically Signed   By: Yvonne Kendall M.D.   On: 01/09/2023 17:10   DG C-Arm 1-60 Min-No Report  Result Date: 01/09/2023 Fluoroscopy was utilized by the requesting physician.  No radiographic interpretation.   ECHOCARDIOGRAM COMPLETE  Result Date: 01/09/2023    ECHOCARDIOGRAM REPORT   Patient Name:   [facility] Wilbert Date of Exam: 01/09/2023 Medical Rec #:  [id number]           Height:       62.2 in Accession #:    [id number]          Weight:       120.0 lb Date of Birth:  24-May-1941           BSA:          1.543 m Patient Age:    82 years            BP:           122/64 mmHg Patient Gender: F                   HR:           90 bpm. Exam  Location:  Inpatient Procedure: 2D Echo, Cardiac Doppler and Color Doppler Indications:     Elevated Troponin  History:         Patient has prior history of Echocardiogram examinations, most                  recent 04/05/2019. Risk Factors:Hypertension, Former Smoker and                  Dyslipidemia.  Sonographer:     Wilkie Aye RVT RCS Referring Phys:  [place] Diagnosing Phys: Glori Bickers MD  Sonographer Comments: Unable to reposition patient due to broken leg. IMPRESSIONS  1. Left ventricular ejection fraction, by estimation, is 65 to 70%. The left ventricle has normal function. The left ventricle has no regional wall motion abnormalities. There is mild left ventricular hypertrophy. Left ventricular diastolic parameters were normal.  2. Right ventricular systolic function is normal. The right ventricular size is normal.  3. Left atrial size was moderately dilated.  4. The mitral valve is normal in structure. Mild mitral valve regurgitation. No evidence of mitral stenosis.  5. Aortic valve is moderately thickened and appears functionally bicuspid. The aortic valve is abnormal. There is moderate calcification of the aortic valve. Aortic valve regurgitation is not visualized. Mild aortic valve stenosis. Aortic valve area, by  VTI measures 2.28 cm. Aortic valve mean gradient measures 8.5 mmHg. Aortic valve Vmax measures 2.04 m/s.  6. The inferior vena cava is normal in size with greater than 50% respiratory variability, suggesting right atrial pressure of 3 mmHg. Comparison(s): EF  60-65. FINDINGS  Left Ventricle: Left ventricular ejection fraction, by estimation, is 65 to 70%. The left ventricle has normal function. The left ventricle has no regional wall motion abnormalities. The left ventricular internal cavity size was normal in size. There is  mild left ventricular hypertrophy. Left ventricular diastolic parameters were normal. Right Ventricle: The right ventricular size is normal. No  increase in right ventricular wall thickness. Right ventricular systolic function is normal. Left Atrium: Left atrial size was moderately dilated. Right Atrium: Right atrial size was normal in size. Pericardium: There is no evidence of pericardial effusion. Mitral Valve: The mitral valve is normal in structure. Mild mitral valve regurgitation. No evidence of mitral valve stenosis. Tricuspid Valve: The tricuspid valve is normal in structure. Tricuspid valve regurgitation is mild . No evidence of tricuspid stenosis. Aortic Valve: Aortic valve is moderately thickened and appears functionally bicuspid. The aortic valve is abnormal. There is moderate calcification of the aortic valve. Aortic valve regurgitation is not visualized. Mild aortic stenosis is present. Aortic  valve mean gradient measures 8.5 mmHg. Aortic valve peak gradient measures 16.6 mmHg. Aortic valve area, by VTI measures 2.28 cm. Pulmonic Valve: The pulmonic valve was grossly normal. Pulmonic valve regurgitation is trivial. No evidence of pulmonic stenosis. Aorta: The aortic root is normal in size and structure. Venous: The inferior vena cava is normal in size with greater than 50% respiratory variability, suggesting right atrial pressure of 3 mmHg. IAS/Shunts: No atrial level shunt detected by color flow Doppler.  LEFT VENTRICLE PLAX 2D LVIDd:         3.50 cm   Diastology LVIDs:         2.00 cm   LV e' medial:    7.56 cm/s LV PW:         1.10 cm   LV E/e' medial:  15.3 LV IVS:        1.30 cm   LV e' lateral:   7.68 cm/s LVOT diam:     2.00 cm   LV E/e' lateral: 15.1 LV SV:         79 LV SV Index:   51 LVOT Area:     3.14 cm  RIGHT VENTRICLE             IVC RV Basal diam:  3.40 cm     IVC diam: 1.90 cm RV Mid diam:    2.50 cm RV S prime:     17.40 cm/s TAPSE (M-mode): 2.2 cm LEFT ATRIUM             Index        RIGHT ATRIUM           Index LA diam:        3.10 cm 2.01 cm/m   RA Area:     10.60 cm LA Vol (A2C):   62.2 ml 40.31 ml/m  RA Volume:    21.30 ml  13.81 ml/m LA Vol (A4C):   80.4 ml 52.11 ml/m LA Biplane Vol: 69.1 ml 44.79 ml/m  AORTIC VALVE                     PULMONIC VALVE AV Area (Vmax):    2.37 cm      PV Vmax:       1.28 m/s AV Area (Vmean):   2.34 cm      PV Peak grad:  6.6 mmHg AV Area (VTI):     2.28 cm AV Vmax:  204.00 cm/s AV Vmean:          138.000 cm/s AV VTI:            0.347 m AV Peak Grad:      16.6 mmHg AV Mean Grad:      8.5 mmHg LVOT Vmax:         154.00 cm/s LVOT Vmean:        103.000 cm/s LVOT VTI:          0.252 m LVOT/AV VTI ratio: 0.73  AORTA Ao Root diam: 3.00 cm MITRAL VALVE                TRICUSPID VALVE MV Area (PHT): 3.28 cm     TR Peak grad:   34.3 mmHg MV Decel Time: 231 msec     TR Vmax:        293.00 cm/s MV E velocity: 116.00 cm/s MV A velocity: 103.00 cm/s  SHUNTS MV E/A ratio:  1.13         Systemic VTI:  0.25 m                             Systemic Diam: 2.00 cm Glori Bickers MD Electronically signed by Glori Bickers MD Signature Date/Time: 01/09/2023/2:01:20 PM    Final (Updated)    CT KNEE LEFT WO CONTRAST  Result Date: 01/09/2023 CLINICAL DATA:  Knee surgical planning EXAM: CT OF THE LEFT KNEE WITHOUT CONTRAST TECHNIQUE: Multidetector CT imaging of the left knee was performed according to the standard protocol. Multiplanar CT image reconstructions were also generated. RADIATION DOSE REDUCTION: This exam was performed according to the departmental dose-optimization program which includes automated exposure control, adjustment of the mA and/or kV according to patient size and/or use of iterative reconstruction technique. COMPARISON:  Knee radiograph 01/08/2023 FINDINGS: Bones/Joint/Cartilage There is a comminuted and displaced distal femoral metaphyseal fracture with intra-articular extension through the lateral trochlea, with impaction and posterior angulation. Diffuse osteopenia. There is a nondisplaced fracture of the anterior aspect of the lateral tibial plateau. There is a tiny  fracture at the upper tip of the patella (series 4, image 54). There is a large lipohemarthrosis. Tiny area of avascular necrosis in the anterior aspect of the lateral femoral condyle (series 8, image 61, series 7, image 42). Ligaments Suboptimally assessed by CT. Muscles and Tendons No acute myotendinous abnormality by CT. Soft tissues Soft tissue swelling along the knee.  No focal fluid collection. IMPRESSION: Comminuted and displaced intra-articular distal femoral metaphyseal fracture with impaction and angulation. Nondisplaced fracture of the anterior aspect of the lateral tibial plateau. Tiny fracture at the upper tip of the patella. Diffuse osteopenia. Tiny area of AVN in the anterior aspect of the lateral femoral condyle. Large lipohemarthrosis. Electronically Signed   By: Maurine Simmering M.D.   On: 01/09/2023 08:22   DG Chest Portable 1 View  Result Date: 01/08/2023 CLINICAL DATA:  Preoperative EXAM: PORTABLE CHEST 1 VIEW COMPARISON:  12/09/2020 FINDINGS: The heart size and mediastinal contours are within normal limits. Both lungs are clear. The visualized skeletal structures are unremarkable. IMPRESSION: No acute abnormality of the lungs in AP portable projection. Electronically Signed   By: Delanna Ahmadi M.D.   On: 01/08/2023 19:36   CT HEAD WO CONTRAST (5MM)  Result Date: 01/08/2023 CLINICAL DATA:  Fall EXAM: CT HEAD WITHOUT CONTRAST CT CERVICAL SPINE WITHOUT CONTRAST TECHNIQUE: Multidetector CT imaging of the head and cervical spine was performed  following the standard protocol without intravenous contrast. Multiplanar CT image reconstructions of the cervical spine were also generated. RADIATION DOSE REDUCTION: This exam was performed according to the departmental dose-optimization program which includes automated exposure control, adjustment of the mA and/or kV according to patient size and/or use of iterative reconstruction technique. COMPARISON:  02/07/2022 FINDINGS: CT HEAD FINDINGS Brain: No  evidence of acute infarction, hemorrhage, hydrocephalus, extra-axial collection or mass lesion/mass effect. Mild periventricular white matter hypodensity. Vascular: No hyperdense vessel or unexpected calcification. Skull: Normal. Negative for fracture or focal lesion. Sinuses/Orbits: No acute finding. Other: None. CT CERVICAL SPINE FINDINGS Alignment: Normal. Skull base and vertebrae: No acute fracture. No primary bone lesion or focal pathologic process. Soft tissues and spinal canal: No prevertebral fluid or swelling. No visible canal hematoma. Disc levels: Moderate space height loss and osteophytosis from C3-C6. Upper chest: Negative. Other: None. IMPRESSION: 1. No acute intracranial pathology. Small-vessel white matter disease. 2. No fracture or static subluxation of the cervical spine. 3. Moderate multilevel cervical disc degenerative disease. Electronically Signed   By: Delanna Ahmadi M.D.   On: 01/08/2023 16:09   CT CERVICAL SPINE WO CONTRAST  Result Date: 01/08/2023 CLINICAL DATA:  Fall EXAM: CT HEAD WITHOUT CONTRAST CT CERVICAL SPINE WITHOUT CONTRAST TECHNIQUE: Multidetector CT imaging of the head and cervical spine was performed following the standard protocol without intravenous contrast. Multiplanar CT image reconstructions of the cervical spine were also generated. RADIATION DOSE REDUCTION: This exam was performed according to the departmental dose-optimization program which includes automated exposure control, adjustment of the mA and/or kV according to patient size and/or use of iterative reconstruction technique. COMPARISON:  02/07/2022 FINDINGS: CT HEAD FINDINGS Brain: No evidence of acute infarction, hemorrhage, hydrocephalus, extra-axial collection or mass lesion/mass effect. Mild periventricular white matter hypodensity. Vascular: No hyperdense vessel or unexpected calcification. Skull: Normal. Negative for fracture or focal lesion. Sinuses/Orbits: No acute finding. Other: None. CT CERVICAL  SPINE FINDINGS Alignment: Normal. Skull base and vertebrae: No acute fracture. No primary bone lesion or focal pathologic process. Soft tissues and spinal canal: No prevertebral fluid or swelling. No visible canal hematoma. Disc levels: Moderate space height loss and osteophytosis from C3-C6. Upper chest: Negative. Other: None. IMPRESSION: 1. No acute intracranial pathology. Small-vessel white matter disease. 2. No fracture or static subluxation of the cervical spine. 3. Moderate multilevel cervical disc degenerative disease. Electronically Signed   By: Delanna Ahmadi M.D.   On: 01/08/2023 16:09   DG Knee Left Port  Result Date: 01/08/2023 CLINICAL DATA:  [place], left knee pain and swelling EXAM: PORTABLE LEFT KNEE - 1-2 VIEW COMPARISON:  02/07/2022 FINDINGS: Frontal, bilateral oblique, and lateral views of the left knee are obtained. Bones are diffusely osteopenic. There is a comminuted intra-articular fracture involving the distal left femur. Fracture lines involve the bilateral femoral condyles, with impaction and valgus angulation at the fracture sites. There is no dislocation. Moderate lipohemarthrosis. Diffuse soft tissue swelling. IMPRESSION: 1. Comminuted intra-articular fractures involving the bilateral femoral condyles, with impaction and valgus angulation at the fracture site. 2. Likely hemarthrosis. 3. Diffuse soft tissue swelling. 4. Marked osteopenia. Electronically Signed   By: Randa Ngo M.D.   On: 01/08/2023 15:46    Microbiology: Results for orders placed or performed during the hospital encounter of 01/08/23  Surgical PCR screen     Status: None   Collection Time: 01/08/23  9:38 PM   Specimen: Nasal Mucosa; Nasal Swab  Result Value Ref Range Status   MRSA, PCR NEGATIVE NEGATIVE Final  Staphylococcus aureus NEGATIVE NEGATIVE Final    Comment: (NOTE) The Xpert SA Assay (FDA approved for NASAL specimens in patients 60 years of age and older), is one component of a  comprehensive surveillance program. It is not intended to diagnose infection nor to guide or monitor treatment. Performed at [facility], [place] [address], [place], [place] [address]    Labs: CBC: Recent Labs  Lab 01/08/23 1628 01/09/23 1822 01/10/23 0012 01/11/23 0403 01/12/23 0158 01/13/23 0533  WBC 11.0* 9.3 7.9 7.0 7.4 7.5  NEUTROABS 9.7*  --   --   --   --   --   HGB 10.9* 9.0* 8.1* 7.1* 8.4* 8.7*  HCT 34.3* 27.4* 23.9* 22.4* 25.0* 26.2*  MCV 101.2* 99.3 98.8 103.2* 96.5 97.8  PLT 200 165 147* 156 155 [id number]   Basic Metabolic Panel: Recent Labs  Lab 01/08/23 1628 01/08/23 1722 01/09/23 1822 01/10/23 0012 01/13/23 0533  NA 141  --   --  137 139  K 3.9  --   --  3.8 3.5  CL 103  --   --  101 106  CO2 28  --   --  27 28  GLUCOSE 96  --   --  149* 101*  BUN 12  --   --  15 10  CREATININE 0.67  --  0.76 0.82 0.54  CALCIUM 8.4*  --   --  8.0* 7.8*  MG  --  2.2  --  2.1  --   PHOS  --  2.8  --   --   --    Liver Function Tests: Recent Labs  Lab 01/08/23 1722  AST 26  ALT 14  ALKPHOS 70  BILITOT 1.0  PROT 5.8*  ALBUMIN 3.4*   CBG: No results for input(s): "GLUCAP" in the last 168 hours.  Discharge time spent: greater than 30 minutes.  Signed: Berle Mull, MD [facility]

## 2023-01-13 NOTE — Progress Notes (Signed)
Irven Easterly to be D/C'd  per MD order. Gave report to Jannette Fogo LPN at [facility]. Discussed with the patient and all questions fully answered. Daughter [name] has been made aware of the transfer.  VSS, Skin clean, dry and intact without evidence of skin break down, no evidence of skin tears noted.  IV catheter discontinued intact. Site without signs and symptoms of complications. Dressing and pressure applied.  An After Visit Summary was printed and given to the [facility].  Patient instructed to return to ED, call 911, or call MD for any changes in condition.

## 2023-01-16 DIAGNOSIS — M6250 Muscle wasting and atrophy, not elsewhere classified, unspecified site: Secondary | ICD-10-CM | POA: Diagnosis not present

## 2023-01-16 DIAGNOSIS — Z9181 History of falling: Secondary | ICD-10-CM | POA: Diagnosis not present

## 2023-01-16 DIAGNOSIS — I10 Essential (primary) hypertension: Secondary | ICD-10-CM | POA: Diagnosis not present

## 2023-01-16 DIAGNOSIS — G250 Essential tremor: Secondary | ICD-10-CM | POA: Diagnosis not present

## 2023-01-16 DIAGNOSIS — M8580 Other specified disorders of bone density and structure, unspecified site: Secondary | ICD-10-CM | POA: Diagnosis not present

## 2023-01-16 DIAGNOSIS — R5381 Other malaise: Secondary | ICD-10-CM | POA: Diagnosis not present

## 2023-01-16 DIAGNOSIS — S72492D Other fracture of lower end of left femur, subsequent encounter for closed fracture with routine healing: Secondary | ICD-10-CM | POA: Diagnosis not present

## 2023-01-16 DIAGNOSIS — E039 Hypothyroidism, unspecified: Secondary | ICD-10-CM | POA: Diagnosis not present

## 2023-01-16 DIAGNOSIS — D62 Acute posthemorrhagic anemia: Secondary | ICD-10-CM | POA: Diagnosis not present

## 2023-01-16 DIAGNOSIS — M6281 Muscle weakness (generalized): Secondary | ICD-10-CM | POA: Diagnosis not present

## 2023-01-16 DIAGNOSIS — Z7189 Other specified counseling: Secondary | ICD-10-CM | POA: Diagnosis not present

## 2023-01-16 DIAGNOSIS — M069 Rheumatoid arthritis, unspecified: Secondary | ICD-10-CM | POA: Diagnosis not present

## 2023-01-16 DIAGNOSIS — S72302D Unspecified fracture of shaft of left femur, subsequent encounter for closed fracture with routine healing: Secondary | ICD-10-CM | POA: Diagnosis not present

## 2023-01-16 DIAGNOSIS — R2681 Unsteadiness on feet: Secondary | ICD-10-CM | POA: Diagnosis not present

## 2023-01-17 DIAGNOSIS — S82252D Displaced comminuted fracture of shaft of left tibia, subsequent encounter for closed fracture with routine healing: Secondary | ICD-10-CM | POA: Diagnosis not present

## 2023-01-17 DIAGNOSIS — M069 Rheumatoid arthritis, unspecified: Secondary | ICD-10-CM | POA: Diagnosis not present

## 2023-01-17 DIAGNOSIS — S72492D Other fracture of lower end of left femur, subsequent encounter for closed fracture with routine healing: Secondary | ICD-10-CM | POA: Diagnosis not present

## 2023-01-17 DIAGNOSIS — E039 Hypothyroidism, unspecified: Secondary | ICD-10-CM | POA: Diagnosis not present

## 2023-01-17 DIAGNOSIS — D849 Immunodeficiency, unspecified: Secondary | ICD-10-CM | POA: Diagnosis not present

## 2023-01-17 DIAGNOSIS — I10 Essential (primary) hypertension: Secondary | ICD-10-CM | POA: Diagnosis not present

## 2023-01-17 DIAGNOSIS — S72302D Unspecified fracture of shaft of left femur, subsequent encounter for closed fracture with routine healing: Secondary | ICD-10-CM | POA: Diagnosis not present

## 2023-01-17 DIAGNOSIS — E611 Iron deficiency: Secondary | ICD-10-CM | POA: Diagnosis not present

## 2023-01-17 DIAGNOSIS — D62 Acute posthemorrhagic anemia: Secondary | ICD-10-CM | POA: Diagnosis not present

## 2023-01-17 DIAGNOSIS — E538 Deficiency of other specified B group vitamins: Secondary | ICD-10-CM | POA: Diagnosis not present

## 2023-01-17 DIAGNOSIS — I2489 Other forms of acute ischemic heart disease: Secondary | ICD-10-CM | POA: Diagnosis not present

## 2023-01-17 DIAGNOSIS — G250 Essential tremor: Secondary | ICD-10-CM | POA: Diagnosis not present

## 2023-01-18 DIAGNOSIS — M6250 Muscle wasting and atrophy, not elsewhere classified, unspecified site: Secondary | ICD-10-CM | POA: Diagnosis not present

## 2023-01-18 DIAGNOSIS — R5381 Other malaise: Secondary | ICD-10-CM | POA: Diagnosis not present

## 2023-01-18 DIAGNOSIS — Z9181 History of falling: Secondary | ICD-10-CM | POA: Diagnosis not present

## 2023-01-18 DIAGNOSIS — M6281 Muscle weakness (generalized): Secondary | ICD-10-CM | POA: Diagnosis not present

## 2023-01-18 DIAGNOSIS — G250 Essential tremor: Secondary | ICD-10-CM | POA: Diagnosis not present

## 2023-01-18 DIAGNOSIS — R2681 Unsteadiness on feet: Secondary | ICD-10-CM | POA: Diagnosis not present

## 2023-01-18 DIAGNOSIS — I10 Essential (primary) hypertension: Secondary | ICD-10-CM | POA: Diagnosis not present

## 2023-01-18 DIAGNOSIS — S72492D Other fracture of lower end of left femur, subsequent encounter for closed fracture with routine healing: Secondary | ICD-10-CM | POA: Diagnosis not present

## 2023-01-19 DIAGNOSIS — I10 Essential (primary) hypertension: Secondary | ICD-10-CM | POA: Diagnosis not present

## 2023-01-19 DIAGNOSIS — N390 Urinary tract infection, site not specified: Secondary | ICD-10-CM | POA: Diagnosis not present

## 2023-01-19 DIAGNOSIS — E559 Vitamin D deficiency, unspecified: Secondary | ICD-10-CM | POA: Diagnosis not present

## 2023-01-20 DIAGNOSIS — E039 Hypothyroidism, unspecified: Secondary | ICD-10-CM | POA: Diagnosis not present

## 2023-01-20 DIAGNOSIS — Z09 Encounter for follow-up examination after completed treatment for conditions other than malignant neoplasm: Secondary | ICD-10-CM | POA: Diagnosis not present

## 2023-01-20 DIAGNOSIS — M6250 Muscle wasting and atrophy, not elsewhere classified, unspecified site: Secondary | ICD-10-CM | POA: Diagnosis not present

## 2023-01-20 DIAGNOSIS — N390 Urinary tract infection, site not specified: Secondary | ICD-10-CM | POA: Diagnosis not present

## 2023-01-20 DIAGNOSIS — R600 Localized edema: Secondary | ICD-10-CM | POA: Diagnosis not present

## 2023-01-20 DIAGNOSIS — M069 Rheumatoid arthritis, unspecified: Secondary | ICD-10-CM | POA: Diagnosis not present

## 2023-01-20 DIAGNOSIS — D62 Acute posthemorrhagic anemia: Secondary | ICD-10-CM | POA: Diagnosis not present

## 2023-01-20 DIAGNOSIS — S72462D Displaced supracondylar fracture with intracondylar extension of lower end of left femur, subsequent encounter for closed fracture with routine healing: Secondary | ICD-10-CM | POA: Diagnosis not present

## 2023-01-20 DIAGNOSIS — Z9189 Other specified personal risk factors, not elsewhere classified: Secondary | ICD-10-CM | POA: Diagnosis not present

## 2023-01-20 DIAGNOSIS — R5381 Other malaise: Secondary | ICD-10-CM | POA: Diagnosis not present

## 2023-01-20 DIAGNOSIS — M6281 Muscle weakness (generalized): Secondary | ICD-10-CM | POA: Diagnosis not present

## 2023-01-20 DIAGNOSIS — S72492D Other fracture of lower end of left femur, subsequent encounter for closed fracture with routine healing: Secondary | ICD-10-CM | POA: Diagnosis not present

## 2023-01-20 DIAGNOSIS — E611 Iron deficiency: Secondary | ICD-10-CM | POA: Diagnosis not present

## 2023-01-20 DIAGNOSIS — Z7901 Long term (current) use of anticoagulants: Secondary | ICD-10-CM | POA: Diagnosis not present

## 2023-01-20 DIAGNOSIS — Z9181 History of falling: Secondary | ICD-10-CM | POA: Diagnosis not present

## 2023-01-20 DIAGNOSIS — S72492A Other fracture of lower end of left femur, initial encounter for closed fracture: Secondary | ICD-10-CM | POA: Diagnosis not present

## 2023-01-20 DIAGNOSIS — I10 Essential (primary) hypertension: Secondary | ICD-10-CM | POA: Diagnosis not present

## 2023-01-20 DIAGNOSIS — G250 Essential tremor: Secondary | ICD-10-CM | POA: Diagnosis not present

## 2023-01-20 DIAGNOSIS — R1310 Dysphagia, unspecified: Secondary | ICD-10-CM | POA: Diagnosis not present

## 2023-01-20 DIAGNOSIS — Z8619 Personal history of other infectious and parasitic diseases: Secondary | ICD-10-CM | POA: Diagnosis not present

## 2023-01-20 DIAGNOSIS — M79605 Pain in left leg: Secondary | ICD-10-CM | POA: Diagnosis not present

## 2023-01-20 DIAGNOSIS — R269 Unspecified abnormalities of gait and mobility: Secondary | ICD-10-CM | POA: Diagnosis not present

## 2023-01-20 DIAGNOSIS — R2681 Unsteadiness on feet: Secondary | ICD-10-CM | POA: Diagnosis not present

## 2023-01-20 DIAGNOSIS — E559 Vitamin D deficiency, unspecified: Secondary | ICD-10-CM | POA: Diagnosis not present

## 2023-01-20 DIAGNOSIS — G5793 Unspecified mononeuropathy of bilateral lower limbs: Secondary | ICD-10-CM | POA: Diagnosis not present

## 2023-01-23 DIAGNOSIS — E039 Hypothyroidism, unspecified: Secondary | ICD-10-CM | POA: Diagnosis not present

## 2023-01-23 DIAGNOSIS — G250 Essential tremor: Secondary | ICD-10-CM | POA: Diagnosis not present

## 2023-01-23 DIAGNOSIS — Z9181 History of falling: Secondary | ICD-10-CM | POA: Diagnosis not present

## 2023-01-23 DIAGNOSIS — R2681 Unsteadiness on feet: Secondary | ICD-10-CM | POA: Diagnosis not present

## 2023-01-23 DIAGNOSIS — M6281 Muscle weakness (generalized): Secondary | ICD-10-CM | POA: Diagnosis not present

## 2023-01-23 DIAGNOSIS — S72462D Displaced supracondylar fracture with intracondylar extension of lower end of left femur, subsequent encounter for closed fracture with routine healing: Secondary | ICD-10-CM | POA: Diagnosis not present

## 2023-01-23 DIAGNOSIS — R5381 Other malaise: Secondary | ICD-10-CM | POA: Diagnosis not present

## 2023-01-23 DIAGNOSIS — I10 Essential (primary) hypertension: Secondary | ICD-10-CM | POA: Diagnosis not present

## 2023-01-23 DIAGNOSIS — S72492D Other fracture of lower end of left femur, subsequent encounter for closed fracture with routine healing: Secondary | ICD-10-CM | POA: Diagnosis not present

## 2023-01-23 DIAGNOSIS — Z8619 Personal history of other infectious and parasitic diseases: Secondary | ICD-10-CM | POA: Diagnosis not present

## 2023-01-23 DIAGNOSIS — M6250 Muscle wasting and atrophy, not elsewhere classified, unspecified site: Secondary | ICD-10-CM | POA: Diagnosis not present

## 2023-01-23 DIAGNOSIS — Z09 Encounter for follow-up examination after completed treatment for conditions other than malignant neoplasm: Secondary | ICD-10-CM | POA: Diagnosis not present

## 2023-01-23 DIAGNOSIS — D62 Acute posthemorrhagic anemia: Secondary | ICD-10-CM | POA: Diagnosis not present

## 2023-01-23 DIAGNOSIS — R600 Localized edema: Secondary | ICD-10-CM | POA: Diagnosis not present

## 2023-01-25 DIAGNOSIS — Z9181 History of falling: Secondary | ICD-10-CM | POA: Diagnosis not present

## 2023-01-25 DIAGNOSIS — M6281 Muscle weakness (generalized): Secondary | ICD-10-CM | POA: Diagnosis not present

## 2023-01-25 DIAGNOSIS — M6250 Muscle wasting and atrophy, not elsewhere classified, unspecified site: Secondary | ICD-10-CM | POA: Diagnosis not present

## 2023-01-25 DIAGNOSIS — S72492D Other fracture of lower end of left femur, subsequent encounter for closed fracture with routine healing: Secondary | ICD-10-CM | POA: Diagnosis not present

## 2023-01-25 DIAGNOSIS — R5381 Other malaise: Secondary | ICD-10-CM | POA: Diagnosis not present

## 2023-01-25 DIAGNOSIS — R2681 Unsteadiness on feet: Secondary | ICD-10-CM | POA: Diagnosis not present

## 2023-01-25 DIAGNOSIS — G250 Essential tremor: Secondary | ICD-10-CM | POA: Diagnosis not present

## 2023-01-26 DIAGNOSIS — S72492D Other fracture of lower end of left femur, subsequent encounter for closed fracture with routine healing: Secondary | ICD-10-CM | POA: Diagnosis not present

## 2023-01-26 DIAGNOSIS — M069 Rheumatoid arthritis, unspecified: Secondary | ICD-10-CM | POA: Diagnosis not present

## 2023-01-26 DIAGNOSIS — G5793 Unspecified mononeuropathy of bilateral lower limbs: Secondary | ICD-10-CM | POA: Diagnosis not present

## 2023-01-26 DIAGNOSIS — Z9189 Other specified personal risk factors, not elsewhere classified: Secondary | ICD-10-CM | POA: Diagnosis not present

## 2023-01-30 DIAGNOSIS — R5381 Other malaise: Secondary | ICD-10-CM | POA: Diagnosis not present

## 2023-01-30 DIAGNOSIS — M6281 Muscle weakness (generalized): Secondary | ICD-10-CM | POA: Diagnosis not present

## 2023-01-30 DIAGNOSIS — S72492D Other fracture of lower end of left femur, subsequent encounter for closed fracture with routine healing: Secondary | ICD-10-CM | POA: Diagnosis not present

## 2023-01-30 DIAGNOSIS — R2681 Unsteadiness on feet: Secondary | ICD-10-CM | POA: Diagnosis not present

## 2023-01-30 DIAGNOSIS — Z9181 History of falling: Secondary | ICD-10-CM | POA: Diagnosis not present

## 2023-01-30 DIAGNOSIS — G250 Essential tremor: Secondary | ICD-10-CM | POA: Diagnosis not present

## 2023-01-30 DIAGNOSIS — M6250 Muscle wasting and atrophy, not elsewhere classified, unspecified site: Secondary | ICD-10-CM | POA: Diagnosis not present

## 2023-02-01 DIAGNOSIS — M6281 Muscle weakness (generalized): Secondary | ICD-10-CM | POA: Diagnosis not present

## 2023-02-01 DIAGNOSIS — S72492A Other fracture of lower end of left femur, initial encounter for closed fracture: Secondary | ICD-10-CM | POA: Diagnosis not present

## 2023-02-01 DIAGNOSIS — R2681 Unsteadiness on feet: Secondary | ICD-10-CM | POA: Diagnosis not present

## 2023-02-01 DIAGNOSIS — M6250 Muscle wasting and atrophy, not elsewhere classified, unspecified site: Secondary | ICD-10-CM | POA: Diagnosis not present

## 2023-02-01 DIAGNOSIS — Z9181 History of falling: Secondary | ICD-10-CM | POA: Diagnosis not present

## 2023-02-01 DIAGNOSIS — S72492D Other fracture of lower end of left femur, subsequent encounter for closed fracture with routine healing: Secondary | ICD-10-CM | POA: Diagnosis not present

## 2023-02-01 DIAGNOSIS — R5381 Other malaise: Secondary | ICD-10-CM | POA: Diagnosis not present

## 2023-02-01 DIAGNOSIS — M069 Rheumatoid arthritis, unspecified: Secondary | ICD-10-CM | POA: Diagnosis not present

## 2023-02-01 DIAGNOSIS — G250 Essential tremor: Secondary | ICD-10-CM | POA: Diagnosis not present

## 2023-02-02 DIAGNOSIS — G5793 Unspecified mononeuropathy of bilateral lower limbs: Secondary | ICD-10-CM | POA: Diagnosis not present

## 2023-02-02 DIAGNOSIS — D62 Acute posthemorrhagic anemia: Secondary | ICD-10-CM | POA: Diagnosis not present

## 2023-02-02 DIAGNOSIS — E039 Hypothyroidism, unspecified: Secondary | ICD-10-CM | POA: Diagnosis not present

## 2023-02-02 DIAGNOSIS — Z7901 Long term (current) use of anticoagulants: Secondary | ICD-10-CM | POA: Diagnosis not present

## 2023-02-02 DIAGNOSIS — M069 Rheumatoid arthritis, unspecified: Secondary | ICD-10-CM | POA: Diagnosis not present

## 2023-02-02 DIAGNOSIS — S72492D Other fracture of lower end of left femur, subsequent encounter for closed fracture with routine healing: Secondary | ICD-10-CM | POA: Diagnosis not present

## 2023-02-02 DIAGNOSIS — E611 Iron deficiency: Secondary | ICD-10-CM | POA: Diagnosis not present

## 2023-02-07 DIAGNOSIS — M069 Rheumatoid arthritis, unspecified: Secondary | ICD-10-CM | POA: Diagnosis not present

## 2023-02-07 DIAGNOSIS — S72492A Other fracture of lower end of left femur, initial encounter for closed fracture: Secondary | ICD-10-CM | POA: Diagnosis not present

## 2023-02-07 DIAGNOSIS — S72492D Other fracture of lower end of left femur, subsequent encounter for closed fracture with routine healing: Secondary | ICD-10-CM | POA: Diagnosis not present

## 2023-02-07 DIAGNOSIS — M6281 Muscle weakness (generalized): Secondary | ICD-10-CM | POA: Diagnosis not present

## 2023-02-14 DIAGNOSIS — S72462D Displaced supracondylar fracture with intracondylar extension of lower end of left femur, subsequent encounter for closed fracture with routine healing: Secondary | ICD-10-CM | POA: Diagnosis not present

## 2023-03-01 DIAGNOSIS — D492 Neoplasm of unspecified behavior of bone, soft tissue, and skin: Secondary | ICD-10-CM | POA: Diagnosis not present

## 2023-03-01 DIAGNOSIS — C44329 Squamous cell carcinoma of skin of other parts of face: Secondary | ICD-10-CM | POA: Diagnosis not present

## 2023-03-01 DIAGNOSIS — C4442 Squamous cell carcinoma of skin of scalp and neck: Secondary | ICD-10-CM | POA: Diagnosis not present

## 2023-03-01 DIAGNOSIS — L720 Epidermal cyst: Secondary | ICD-10-CM | POA: Diagnosis not present

## 2023-03-01 DIAGNOSIS — L718 Other rosacea: Secondary | ICD-10-CM | POA: Diagnosis not present

## 2023-03-01 DIAGNOSIS — C4402 Squamous cell carcinoma of skin of lip: Secondary | ICD-10-CM | POA: Diagnosis not present

## 2023-03-04 DIAGNOSIS — M069 Rheumatoid arthritis, unspecified: Secondary | ICD-10-CM | POA: Diagnosis not present

## 2023-03-04 DIAGNOSIS — S72492D Other fracture of lower end of left femur, subsequent encounter for closed fracture with routine healing: Secondary | ICD-10-CM | POA: Diagnosis not present

## 2023-03-04 DIAGNOSIS — M6281 Muscle weakness (generalized): Secondary | ICD-10-CM | POA: Diagnosis not present

## 2023-03-04 DIAGNOSIS — S72492A Other fracture of lower end of left femur, initial encounter for closed fracture: Secondary | ICD-10-CM | POA: Diagnosis not present

## 2023-03-10 DIAGNOSIS — M6281 Muscle weakness (generalized): Secondary | ICD-10-CM | POA: Diagnosis not present

## 2023-03-10 DIAGNOSIS — S72492D Other fracture of lower end of left femur, subsequent encounter for closed fracture with routine healing: Secondary | ICD-10-CM | POA: Diagnosis not present

## 2023-03-10 DIAGNOSIS — M069 Rheumatoid arthritis, unspecified: Secondary | ICD-10-CM | POA: Diagnosis not present

## 2023-03-10 DIAGNOSIS — S72492A Other fracture of lower end of left femur, initial encounter for closed fracture: Secondary | ICD-10-CM | POA: Diagnosis not present

## 2023-03-21 DIAGNOSIS — E782 Mixed hyperlipidemia: Secondary | ICD-10-CM | POA: Diagnosis not present

## 2023-03-21 DIAGNOSIS — I10 Essential (primary) hypertension: Secondary | ICD-10-CM | POA: Diagnosis not present

## 2023-03-21 DIAGNOSIS — S72462D Displaced supracondylar fracture with intracondylar extension of lower end of left femur, subsequent encounter for closed fracture with routine healing: Secondary | ICD-10-CM | POA: Diagnosis not present

## 2023-03-22 ENCOUNTER — Telehealth: Payer: Self-pay | Admitting: Internal Medicine

## 2023-03-22 NOTE — Telephone Encounter (Signed)
Spoke with [employer]-  we did not order Home Health, rec check with Ortho surgeon.

## 2023-03-22 NOTE — Telephone Encounter (Signed)
-----   Message from Silver Huguenin sent at 03/21/2023  3:28 PM EDT ----- Regarding: [facility] called wanting to know the status on a referral for home health that was supposed to be sent to them. Also wanting to update the fax number we have for them to send it to if we haven't done so already, [phone]. Thank you

## 2023-03-24 DIAGNOSIS — M069 Rheumatoid arthritis, unspecified: Secondary | ICD-10-CM | POA: Diagnosis not present

## 2023-03-24 DIAGNOSIS — R7303 Prediabetes: Secondary | ICD-10-CM | POA: Diagnosis not present

## 2023-03-24 DIAGNOSIS — S72492D Other fracture of lower end of left femur, subsequent encounter for closed fracture with routine healing: Secondary | ICD-10-CM | POA: Diagnosis not present

## 2023-03-24 DIAGNOSIS — I719 Aortic aneurysm of unspecified site, without rupture: Secondary | ICD-10-CM | POA: Diagnosis not present

## 2023-03-24 DIAGNOSIS — M5030 Other cervical disc degeneration, unspecified cervical region: Secondary | ICD-10-CM | POA: Diagnosis not present

## 2023-03-24 DIAGNOSIS — M8580 Other specified disorders of bone density and structure, unspecified site: Secondary | ICD-10-CM | POA: Diagnosis not present

## 2023-03-24 DIAGNOSIS — E785 Hyperlipidemia, unspecified: Secondary | ICD-10-CM | POA: Diagnosis not present

## 2023-03-24 DIAGNOSIS — Z7982 Long term (current) use of aspirin: Secondary | ICD-10-CM | POA: Diagnosis not present

## 2023-03-24 DIAGNOSIS — Z9181 History of falling: Secondary | ICD-10-CM | POA: Diagnosis not present

## 2023-03-24 DIAGNOSIS — E039 Hypothyroidism, unspecified: Secondary | ICD-10-CM | POA: Diagnosis not present

## 2023-03-24 DIAGNOSIS — S82252D Displaced comminuted fracture of shaft of left tibia, subsequent encounter for closed fracture with routine healing: Secondary | ICD-10-CM | POA: Diagnosis not present

## 2023-03-24 DIAGNOSIS — S72462D Displaced supracondylar fracture with intracondylar extension of lower end of left femur, subsequent encounter for closed fracture with routine healing: Secondary | ICD-10-CM | POA: Diagnosis not present

## 2023-03-24 DIAGNOSIS — E538 Deficiency of other specified B group vitamins: Secondary | ICD-10-CM | POA: Diagnosis not present

## 2023-03-24 DIAGNOSIS — G250 Essential tremor: Secondary | ICD-10-CM | POA: Diagnosis not present

## 2023-03-24 DIAGNOSIS — K219 Gastro-esophageal reflux disease without esophagitis: Secondary | ICD-10-CM | POA: Diagnosis not present

## 2023-03-24 DIAGNOSIS — I10 Essential (primary) hypertension: Secondary | ICD-10-CM | POA: Diagnosis not present

## 2023-03-27 DIAGNOSIS — E538 Deficiency of other specified B group vitamins: Secondary | ICD-10-CM | POA: Diagnosis not present

## 2023-03-27 DIAGNOSIS — R7303 Prediabetes: Secondary | ICD-10-CM | POA: Diagnosis not present

## 2023-03-27 DIAGNOSIS — M069 Rheumatoid arthritis, unspecified: Secondary | ICD-10-CM | POA: Diagnosis not present

## 2023-03-27 DIAGNOSIS — I719 Aortic aneurysm of unspecified site, without rupture: Secondary | ICD-10-CM | POA: Diagnosis not present

## 2023-03-27 DIAGNOSIS — S82252D Displaced comminuted fracture of shaft of left tibia, subsequent encounter for closed fracture with routine healing: Secondary | ICD-10-CM | POA: Diagnosis not present

## 2023-03-27 DIAGNOSIS — K219 Gastro-esophageal reflux disease without esophagitis: Secondary | ICD-10-CM | POA: Diagnosis not present

## 2023-03-27 DIAGNOSIS — S72462D Displaced supracondylar fracture with intracondylar extension of lower end of left femur, subsequent encounter for closed fracture with routine healing: Secondary | ICD-10-CM | POA: Diagnosis not present

## 2023-03-27 DIAGNOSIS — M5030 Other cervical disc degeneration, unspecified cervical region: Secondary | ICD-10-CM | POA: Diagnosis not present

## 2023-03-27 DIAGNOSIS — E039 Hypothyroidism, unspecified: Secondary | ICD-10-CM | POA: Diagnosis not present

## 2023-03-27 DIAGNOSIS — M8580 Other specified disorders of bone density and structure, unspecified site: Secondary | ICD-10-CM | POA: Diagnosis not present

## 2023-03-27 DIAGNOSIS — I10 Essential (primary) hypertension: Secondary | ICD-10-CM | POA: Diagnosis not present

## 2023-03-27 DIAGNOSIS — S72492D Other fracture of lower end of left femur, subsequent encounter for closed fracture with routine healing: Secondary | ICD-10-CM | POA: Diagnosis not present

## 2023-03-27 DIAGNOSIS — G250 Essential tremor: Secondary | ICD-10-CM | POA: Diagnosis not present

## 2023-03-27 DIAGNOSIS — Z9181 History of falling: Secondary | ICD-10-CM | POA: Diagnosis not present

## 2023-03-27 DIAGNOSIS — Z7982 Long term (current) use of aspirin: Secondary | ICD-10-CM | POA: Diagnosis not present

## 2023-03-27 DIAGNOSIS — E785 Hyperlipidemia, unspecified: Secondary | ICD-10-CM | POA: Diagnosis not present

## 2023-03-29 DIAGNOSIS — R7303 Prediabetes: Secondary | ICD-10-CM | POA: Diagnosis not present

## 2023-03-29 DIAGNOSIS — I10 Essential (primary) hypertension: Secondary | ICD-10-CM | POA: Diagnosis not present

## 2023-03-29 DIAGNOSIS — S82252D Displaced comminuted fracture of shaft of left tibia, subsequent encounter for closed fracture with routine healing: Secondary | ICD-10-CM | POA: Diagnosis not present

## 2023-03-29 DIAGNOSIS — E039 Hypothyroidism, unspecified: Secondary | ICD-10-CM | POA: Diagnosis not present

## 2023-03-29 DIAGNOSIS — Z9181 History of falling: Secondary | ICD-10-CM | POA: Diagnosis not present

## 2023-03-29 DIAGNOSIS — S72492D Other fracture of lower end of left femur, subsequent encounter for closed fracture with routine healing: Secondary | ICD-10-CM | POA: Diagnosis not present

## 2023-03-29 DIAGNOSIS — M5030 Other cervical disc degeneration, unspecified cervical region: Secondary | ICD-10-CM | POA: Diagnosis not present

## 2023-03-29 DIAGNOSIS — K219 Gastro-esophageal reflux disease without esophagitis: Secondary | ICD-10-CM | POA: Diagnosis not present

## 2023-03-29 DIAGNOSIS — Z7982 Long term (current) use of aspirin: Secondary | ICD-10-CM | POA: Diagnosis not present

## 2023-03-29 DIAGNOSIS — E782 Mixed hyperlipidemia: Secondary | ICD-10-CM | POA: Diagnosis not present

## 2023-03-29 DIAGNOSIS — M8580 Other specified disorders of bone density and structure, unspecified site: Secondary | ICD-10-CM | POA: Diagnosis not present

## 2023-03-29 DIAGNOSIS — E538 Deficiency of other specified B group vitamins: Secondary | ICD-10-CM | POA: Diagnosis not present

## 2023-03-29 DIAGNOSIS — S72462D Displaced supracondylar fracture with intracondylar extension of lower end of left femur, subsequent encounter for closed fracture with routine healing: Secondary | ICD-10-CM | POA: Diagnosis not present

## 2023-03-29 DIAGNOSIS — M069 Rheumatoid arthritis, unspecified: Secondary | ICD-10-CM | POA: Diagnosis not present

## 2023-03-29 DIAGNOSIS — I719 Aortic aneurysm of unspecified site, without rupture: Secondary | ICD-10-CM | POA: Diagnosis not present

## 2023-03-29 DIAGNOSIS — G250 Essential tremor: Secondary | ICD-10-CM | POA: Diagnosis not present

## 2023-03-29 DIAGNOSIS — E785 Hyperlipidemia, unspecified: Secondary | ICD-10-CM | POA: Diagnosis not present

## 2023-03-30 ENCOUNTER — Other Ambulatory Visit (HOSPITAL_COMMUNITY): Payer: Self-pay | Admitting: Student

## 2023-03-30 DIAGNOSIS — S72462D Displaced supracondylar fracture with intracondylar extension of lower end of left femur, subsequent encounter for closed fracture with routine healing: Secondary | ICD-10-CM

## 2023-04-03 DIAGNOSIS — M069 Rheumatoid arthritis, unspecified: Secondary | ICD-10-CM | POA: Diagnosis not present

## 2023-04-03 DIAGNOSIS — M6281 Muscle weakness (generalized): Secondary | ICD-10-CM | POA: Diagnosis not present

## 2023-04-03 DIAGNOSIS — S72492D Other fracture of lower end of left femur, subsequent encounter for closed fracture with routine healing: Secondary | ICD-10-CM | POA: Diagnosis not present

## 2023-04-03 DIAGNOSIS — S72492A Other fracture of lower end of left femur, initial encounter for closed fracture: Secondary | ICD-10-CM | POA: Diagnosis not present

## 2023-04-05 DIAGNOSIS — M069 Rheumatoid arthritis, unspecified: Secondary | ICD-10-CM | POA: Diagnosis not present

## 2023-04-05 DIAGNOSIS — S72462D Displaced supracondylar fracture with intracondylar extension of lower end of left femur, subsequent encounter for closed fracture with routine healing: Secondary | ICD-10-CM | POA: Diagnosis not present

## 2023-04-05 DIAGNOSIS — I10 Essential (primary) hypertension: Secondary | ICD-10-CM | POA: Diagnosis not present

## 2023-04-05 DIAGNOSIS — K219 Gastro-esophageal reflux disease without esophagitis: Secondary | ICD-10-CM | POA: Diagnosis not present

## 2023-04-05 DIAGNOSIS — E538 Deficiency of other specified B group vitamins: Secondary | ICD-10-CM | POA: Diagnosis not present

## 2023-04-05 DIAGNOSIS — S82252D Displaced comminuted fracture of shaft of left tibia, subsequent encounter for closed fracture with routine healing: Secondary | ICD-10-CM | POA: Diagnosis not present

## 2023-04-05 DIAGNOSIS — Z9181 History of falling: Secondary | ICD-10-CM | POA: Diagnosis not present

## 2023-04-05 DIAGNOSIS — Z7982 Long term (current) use of aspirin: Secondary | ICD-10-CM | POA: Diagnosis not present

## 2023-04-05 DIAGNOSIS — M8580 Other specified disorders of bone density and structure, unspecified site: Secondary | ICD-10-CM | POA: Diagnosis not present

## 2023-04-05 DIAGNOSIS — G250 Essential tremor: Secondary | ICD-10-CM | POA: Diagnosis not present

## 2023-04-05 DIAGNOSIS — M5030 Other cervical disc degeneration, unspecified cervical region: Secondary | ICD-10-CM | POA: Diagnosis not present

## 2023-04-05 DIAGNOSIS — I719 Aortic aneurysm of unspecified site, without rupture: Secondary | ICD-10-CM | POA: Diagnosis not present

## 2023-04-05 DIAGNOSIS — E785 Hyperlipidemia, unspecified: Secondary | ICD-10-CM | POA: Diagnosis not present

## 2023-04-05 DIAGNOSIS — S72492D Other fracture of lower end of left femur, subsequent encounter for closed fracture with routine healing: Secondary | ICD-10-CM | POA: Diagnosis not present

## 2023-04-05 DIAGNOSIS — E039 Hypothyroidism, unspecified: Secondary | ICD-10-CM | POA: Diagnosis not present

## 2023-04-05 DIAGNOSIS — R7303 Prediabetes: Secondary | ICD-10-CM | POA: Diagnosis not present

## 2023-04-07 DIAGNOSIS — E039 Hypothyroidism, unspecified: Secondary | ICD-10-CM | POA: Diagnosis not present

## 2023-04-07 DIAGNOSIS — M069 Rheumatoid arthritis, unspecified: Secondary | ICD-10-CM | POA: Diagnosis not present

## 2023-04-07 DIAGNOSIS — S72492D Other fracture of lower end of left femur, subsequent encounter for closed fracture with routine healing: Secondary | ICD-10-CM | POA: Diagnosis not present

## 2023-04-07 DIAGNOSIS — S72462D Displaced supracondylar fracture with intracondylar extension of lower end of left femur, subsequent encounter for closed fracture with routine healing: Secondary | ICD-10-CM | POA: Diagnosis not present

## 2023-04-07 DIAGNOSIS — Z9181 History of falling: Secondary | ICD-10-CM | POA: Diagnosis not present

## 2023-04-07 DIAGNOSIS — E538 Deficiency of other specified B group vitamins: Secondary | ICD-10-CM | POA: Diagnosis not present

## 2023-04-07 DIAGNOSIS — I719 Aortic aneurysm of unspecified site, without rupture: Secondary | ICD-10-CM | POA: Diagnosis not present

## 2023-04-07 DIAGNOSIS — S82252D Displaced comminuted fracture of shaft of left tibia, subsequent encounter for closed fracture with routine healing: Secondary | ICD-10-CM | POA: Diagnosis not present

## 2023-04-07 DIAGNOSIS — G250 Essential tremor: Secondary | ICD-10-CM | POA: Diagnosis not present

## 2023-04-07 DIAGNOSIS — M8580 Other specified disorders of bone density and structure, unspecified site: Secondary | ICD-10-CM | POA: Diagnosis not present

## 2023-04-07 DIAGNOSIS — Z7982 Long term (current) use of aspirin: Secondary | ICD-10-CM | POA: Diagnosis not present

## 2023-04-07 DIAGNOSIS — M5030 Other cervical disc degeneration, unspecified cervical region: Secondary | ICD-10-CM | POA: Diagnosis not present

## 2023-04-07 DIAGNOSIS — K219 Gastro-esophageal reflux disease without esophagitis: Secondary | ICD-10-CM | POA: Diagnosis not present

## 2023-04-07 DIAGNOSIS — I10 Essential (primary) hypertension: Secondary | ICD-10-CM | POA: Diagnosis not present

## 2023-04-07 DIAGNOSIS — R7303 Prediabetes: Secondary | ICD-10-CM | POA: Diagnosis not present

## 2023-04-07 DIAGNOSIS — E785 Hyperlipidemia, unspecified: Secondary | ICD-10-CM | POA: Diagnosis not present

## 2023-04-09 DIAGNOSIS — S72492A Other fracture of lower end of left femur, initial encounter for closed fracture: Secondary | ICD-10-CM | POA: Diagnosis not present

## 2023-04-09 DIAGNOSIS — M069 Rheumatoid arthritis, unspecified: Secondary | ICD-10-CM | POA: Diagnosis not present

## 2023-04-09 DIAGNOSIS — M6281 Muscle weakness (generalized): Secondary | ICD-10-CM | POA: Diagnosis not present

## 2023-04-09 DIAGNOSIS — S72492D Other fracture of lower end of left femur, subsequent encounter for closed fracture with routine healing: Secondary | ICD-10-CM | POA: Diagnosis not present

## 2023-04-10 ENCOUNTER — Encounter: Payer: Self-pay | Admitting: Internal Medicine

## 2023-04-10 DIAGNOSIS — Z7982 Long term (current) use of aspirin: Secondary | ICD-10-CM | POA: Diagnosis not present

## 2023-04-10 DIAGNOSIS — M8580 Other specified disorders of bone density and structure, unspecified site: Secondary | ICD-10-CM | POA: Diagnosis not present

## 2023-04-10 DIAGNOSIS — I10 Essential (primary) hypertension: Secondary | ICD-10-CM | POA: Diagnosis not present

## 2023-04-10 DIAGNOSIS — S82252D Displaced comminuted fracture of shaft of left tibia, subsequent encounter for closed fracture with routine healing: Secondary | ICD-10-CM | POA: Diagnosis not present

## 2023-04-10 DIAGNOSIS — R7303 Prediabetes: Secondary | ICD-10-CM | POA: Diagnosis not present

## 2023-04-10 DIAGNOSIS — G250 Essential tremor: Secondary | ICD-10-CM | POA: Diagnosis not present

## 2023-04-10 DIAGNOSIS — E785 Hyperlipidemia, unspecified: Secondary | ICD-10-CM | POA: Diagnosis not present

## 2023-04-10 DIAGNOSIS — I719 Aortic aneurysm of unspecified site, without rupture: Secondary | ICD-10-CM | POA: Diagnosis not present

## 2023-04-10 DIAGNOSIS — S72492D Other fracture of lower end of left femur, subsequent encounter for closed fracture with routine healing: Secondary | ICD-10-CM | POA: Diagnosis not present

## 2023-04-10 DIAGNOSIS — E039 Hypothyroidism, unspecified: Secondary | ICD-10-CM | POA: Diagnosis not present

## 2023-04-10 DIAGNOSIS — M069 Rheumatoid arthritis, unspecified: Secondary | ICD-10-CM | POA: Diagnosis not present

## 2023-04-10 DIAGNOSIS — Z9181 History of falling: Secondary | ICD-10-CM | POA: Diagnosis not present

## 2023-04-10 DIAGNOSIS — E538 Deficiency of other specified B group vitamins: Secondary | ICD-10-CM | POA: Diagnosis not present

## 2023-04-10 DIAGNOSIS — S72462D Displaced supracondylar fracture with intracondylar extension of lower end of left femur, subsequent encounter for closed fracture with routine healing: Secondary | ICD-10-CM | POA: Diagnosis not present

## 2023-04-10 DIAGNOSIS — M5030 Other cervical disc degeneration, unspecified cervical region: Secondary | ICD-10-CM | POA: Diagnosis not present

## 2023-04-10 DIAGNOSIS — K219 Gastro-esophageal reflux disease without esophagitis: Secondary | ICD-10-CM | POA: Diagnosis not present

## 2023-04-10 NOTE — Patient Instructions (Signed)

## 2023-04-10 NOTE — Progress Notes (Unsigned)
Annual Screening/Preventative Visit &  Comprehensive Evaluation &  Examination   Future Appointments  Date Time Provider Department  04/11/2023                  cpe 11:00 AM Lucky Cowboy, MD [facility]  07/13/2023                  wellness  4:00 PM Adela Glimpse, NP [facility]  04/22/2024                       cpe 11:00 AM Lucky Cowboy, MD [facility]        This very nice 82 y.o.WWF with  HTN, HLD, Hypothyroidism, Rheumatoid Arthritis,   Prediabetes  and Vitamin D Deficiency  presents for a Screening /Preventative Visit & comprehensive evaluation and management of multiple medical co-morbidities.   Patient has Aortic Atherosclerosis by CXR in 2020 and CT scan in 2021. Patient 's RA (2001) is felt in remission on MTX & Plaquenil followed by Dr Barbee Cough [place]        HTN predates since 1998. Patient's BP has been controlled at home and patient denies any cardiac symptoms as chest pain, palpitations, shortness of breath, dizziness or ankle swelling. Today's BP is at goal -                         .        Patient's hyperlipidemia is controlled with diet. Last lipids were  at goal :  Lab Results  Component Value Date   CHOL 167 10/27/2022   HDL 63 10/27/2022   LDLCALC 86 10/27/2022   TRIG 88 10/27/2022   CHOLHDL 2.7 10/27/2022         Patient has hx/o prediabetes (2012) and patient denies reactive hypoglycemic symptoms, visual blurring, diabetic polys or paresthesias. Last A1c was normal & at goal .  Lab Results  Component Value Date   HGBA1C 5.0 04/05/2022         Patient was dx'd Hypothyroid in 1994 & initiated on replacement therapy.       Finally, patient has history of Vitamin D Deficiency ("17" /2009) and last Vitamin D was not at goal (70-100) :  Lab Results  Component Value Date   VD25OH 76.46 01/10/2023       Current Outpatient Medications  Medication Instructions   acetaminophen (TYLENOL) 650 mg, Oral, Every 6 hours PRN   alendronate  (FOSAMAX) 70 mg, Oral, Weekly, Take with a full glass of water on an empty stomach.   apixaban (ELIQUIS) 2.5 mg, Oral, 2 times daily   Cyanocobalamin (VITAMIN B 12 PO) 1,000 mcg, Sublingual, Every evening   diazepam (VALIUM) 5 mg, Oral, Every 12 hours PRN, TAKE 1 TABLET THREE TIMES DAILY  FOR  TREMOR   dicyclomine (BENTYL) 20 MG tablet TAKE 1 TABLET THREE TIMES DAILY BEFORE MEALS FOR CRAMPING, BLOATING, NAUSEA OR DIARRHEA   docusate sodium (COLACE) 100 mg, Oral, 2 times daily   folic acid (FOLVITE) 800 MCG tablet Take 1 tablet daily.   furosemide (LASIX) 40 MG tablet TAKE 1 TABLET TWICE DAILY FOR FLUID RETENTION AND ANKLE  SWELLING   HYDROcodone-acetaminophen (NORCO/VICODIN) 5-325 MG tablet 1 tablet, Oral, Every 4 hours PRN   hydrocortisone 2.5 % ointment Topical, 2 times daily   hydroxychloroquine (PLAQUENIL) 200 mg, Oral, See admin instructions   levothyroxine (SYNTHROID) 100 MCG tablet Take 1/2 tablet on Mon and Friday and  1 whole tablet  Daily  on an empty stomach with only water for 30 minutes & no Antacid meds, Calcium or Magnesium for 4 hours & avoid Biotin   methotrexate (RHEUMATREX) 10 mg, Oral, See admin instructions, Take 4 tablets (10 mg) by mouth twice weekly - Tuesday and Wednesday   montelukast (SINGULAIR) 10 MG tablet TAKE 1 TABLET EVERY DAY   Multiple Vitamin (MULTIVITAMIN WITH MINERALS) TABS tablet 1 tablet, Oral, Every evening   ondansetron (ZOFRAN ODT) 8 MG disintegrating tablet 8mg  ODT q4 hours prn nausea   polyethylene glycol (MIRALAX / GLYCOLAX) 17 g, Oral, Daily   propranolol (INDERAL) 80 MG tablet TAKE 1 TABLET TWICE DAILY FOR BLOOD PRESSURE AND TREMORS   triamcinolone (KENALOG) 0.025 % ointment 1 Application, Topical, 2 times daily   Vitamin D 10,000 Units, Oral, Every evening     Allergies  Allergen Reactions   Codeine Nausea Only   Mysoline [Primidone] Nausea Only and Other (See Comments)    Over-sedated   Sulfa Antibiotics Itching     Past Medical History:   Diagnosis Date   Anemia    Cataract    Diverticulosis 09/03/2020   Environmental allergies    Gastric ulcer    Heartburn    Hiatal hernia    Hyperlipidemia    Hypertension    Hypothyroidism    Migraines    Prediabetes    Rash october 2012   all over abdomen and breasts   Rhabdomyolysis, traumatic   12/09/2020 12/09/2020   Rheumatoid arthritis(714.0)    Tremor, essential    right hand     Immunization History  Administered Date(s) Administered   DT (Pediatric) 02/05/2015   Influenza Split 09/12/2013   Influenza, High Dose  10/28/2014, 08/18/2016, 11/22/2018, 12/09/2019, 10/06/2020, 11/03/2021   Pneumococcal -13 05/12/2015   Pneumococcal-23 11/21/2006   Td 11/22/2003   Tdap 06/26/2021   Varicella 06/23/2011   Zoster, Live 06/23/2011    Last Colon - 11/12/2-013 - Dr Arlyce Dice - deferred  F/U due to age.   Last MGM - 09/10/2019 - overdue   Past Surgical History:  Procedure Laterality Date   ABDOMINAL HYSTERECTOMY     CATARACT EXTRACTION, BILATERAL     CHOLECYSTECTOMY  08/31/11   lap chole    COLONOSCOPY     TUBAL LIGATION       Family History  Problem Relation Age of Onset   Heart disease Mother    Diabetes Mother    Heart disease Father    Heart disease Maternal Aunt        x3    Heart disease Maternal Uncle        x 4   Diabetes Sister        borderline   Heart attack Son 59       Sudden cardiac death   Colon cancer Neg Hx      Social History   Tobacco Use   Smoking status: Former    Types: Cigarettes    Quit date: 09/18/1984    Years since quitting: 37.5   Smokeless tobacco: Never   Tobacco comments:    stopped around 25 years ago  Substance Use Topics   Alcohol use: No   Drug use: No      ROS Constitutional: Denies fever, chills, weight loss/gain, headaches, insomnia,  night sweats, and change in appetite. Does c/o fatigue. Eyes: Denies redness, blurred vision, diplopia, discharge, itchy, watery eyes.  ENT: Denies discharge,  congestion, post nasal drip, epistaxis, sore throat,  earache, hearing loss, dental pain, Tinnitus, Vertigo, Sinus pain, snoring.  Cardio: Denies chest pain, palpitations, irregular heartbeat, syncope, dyspnea, diaphoresis, orthopnea, PND, claudication, edema Respiratory: denies cough, dyspnea, DOE, pleurisy, hoarseness, laryngitis, wheezing.  Gastrointestinal: Denies dysphagia, heartburn, reflux, water brash, pain, cramps, nausea, vomiting, bloating, diarrhea, constipation, hematemesis, melena, hematochezia, jaundice, hemorrhoids Genitourinary: Denies dysuria, frequency, urgency, nocturia, hesitancy, discharge, hematuria, flank pain Breast: Breast lumps, nipple discharge, bleeding.  Musculoskeletal: Denies arthralgia, myalgia, stiffness, Jt. Swelling, pain, limp, and strain/sprain. Denies falls. Skin: Denies puritis, rash, hives, warts, acne, eczema, changing in skin lesion Neuro: No weakness, tremor, incoordination, spasms, paresthesia, pain Psychiatric: Denies confusion, memory loss, sensory loss. Denies Depression. Endocrine: Denies change in weight, skin, hair change, nocturia, and paresthesia, diabetic polys, visual blurring, hyper / hypo glycemic episodes.  Heme/Lymph: No excessive bleeding, bruising, enlarged lymph nodes.  Physical Exam  There were no vitals taken for this visit.  General Appearance: Well nourished, well groomed and in no apparent distress.  Eyes: PERRLA, EOMs, conjunctiva no swelling or erythema, normal fundi and vessels. Sinuses: No frontal/maxillary tenderness ENT/Mouth: EACs patent / TMs  nl. Nares clear without erythema, swelling, mucoid exudates. Oral hygiene is good. No erythema, swelling, or exudate. Tongue normal, non-obstructing. Tonsils not swollen or erythematous. Hearing normal.  Neck: Supple, thyroid not palpable. No bruits, nodes or JVD. Respiratory: Respiratory effort normal.  BS equal and clear bilateral without rales, rhonci, wheezing or  stridor. Cardio: Heart sounds are normal with regular rate and rhythm and no murmurs, rubs or gallops. Peripheral pulses are normal and equal bilaterally without edema. No aortic or femoral bruits. Chest: symmetric with normal excursions and percussion. Breasts: Symmetric, without lumps, nipple discharge, retractions, or fibrocystic changes.  Abdomen: Flat, soft with bowel sounds active. Nontender, no guarding, rebound, hernias, masses, or organomegaly.  Lymphatics: Non tender without lymphadenopathy.  Genitourinary:  Musculoskeletal: Full ROM all peripheral extremities, joint stability, 5/5 strength, and normal gait. Skin: Warm and dry without rashes, lesions, cyanosis, clubbing or  ecchymosis.  Neuro: Cranial nerves intact, reflexes equal bilaterally. Normal muscle tone, no cerebellar symptoms. Sensation intact.  Pysch: Alert and oriented X 3, normal affect, Insight and Judgment appropriate.     1. Encounter for general adult medical examination with abnormal findings  - CBC with Differential/Platelet - COMPLETE METABOLIC PANEL WITH GFR - Magnesium - Lipid panel - TSH - Hemoglobin A1c - Insulin, random - VITAMIN D 25 Hydroxy   2. Essential hypertension  - EKG 12-Lead - Urinalysis, Routine w reflex microscopic - Microalbumin / creatinine urine ratio - CBC with Differential/Platelet - COMPLETE METABOLIC PANEL WITH GFR - Magnesium - TSH  3. Hyperlipidemia, mixed  - EKG 12-Lead - Lipid panel - TSH  4. Vitamin D deficiency  - VITAMIN D 25 Hydroxy   5. FHx: heart disease  - EKG 12-Lead  6. Aortic atherosclerosis (HCC)  - Lipid panel  7. Former smoker  - EKG 12-Lead  8. Rheumatoid arthritis involving multiple sites (HCC)   9. Hereditary essential tremor   10. Hypothyroidism due to medication  - TSH  11. Screening for colorectal cancer  - POC Hemoccult Bld/Stl 12. Abnormal glucose  - Hemoglobin A1c - Insulin, random  13. Medication management  -  Urinalysis, Routine w reflex microscopic - Microalbumin / creatinine urine ratio - CBC with Differential/Platelet - COMPLETE METABOLIC PANEL WITH GFR - Magnesium - Lipid panel - TSH - Hemoglobin A1c - Insulin, random - VITAMIN D 25 Hydroxy  Patient was counseled in prudent diet to achieve/maintain BMI less than 25 for weight control, BP monitoring, regular exercise and medications. Discussed med's effects and SE's. Screening labs and tests as requested with regular follow-up as recommended. Over 40 minutes of exam, counseling, chart review and high complex critical decision making was performed.   Marinus Maw, MD

## 2023-04-11 ENCOUNTER — Ambulatory Visit: Payer: Medicare Other | Admitting: Internal Medicine

## 2023-04-11 DIAGNOSIS — I10 Essential (primary) hypertension: Secondary | ICD-10-CM

## 2023-04-12 DIAGNOSIS — E039 Hypothyroidism, unspecified: Secondary | ICD-10-CM | POA: Diagnosis not present

## 2023-04-12 DIAGNOSIS — K219 Gastro-esophageal reflux disease without esophagitis: Secondary | ICD-10-CM | POA: Diagnosis not present

## 2023-04-12 DIAGNOSIS — I10 Essential (primary) hypertension: Secondary | ICD-10-CM | POA: Diagnosis not present

## 2023-04-12 DIAGNOSIS — Z9181 History of falling: Secondary | ICD-10-CM | POA: Diagnosis not present

## 2023-04-12 DIAGNOSIS — R7303 Prediabetes: Secondary | ICD-10-CM | POA: Diagnosis not present

## 2023-04-12 DIAGNOSIS — M5030 Other cervical disc degeneration, unspecified cervical region: Secondary | ICD-10-CM | POA: Diagnosis not present

## 2023-04-12 DIAGNOSIS — E538 Deficiency of other specified B group vitamins: Secondary | ICD-10-CM | POA: Diagnosis not present

## 2023-04-12 DIAGNOSIS — Z7982 Long term (current) use of aspirin: Secondary | ICD-10-CM | POA: Diagnosis not present

## 2023-04-12 DIAGNOSIS — M069 Rheumatoid arthritis, unspecified: Secondary | ICD-10-CM | POA: Diagnosis not present

## 2023-04-12 DIAGNOSIS — S72492D Other fracture of lower end of left femur, subsequent encounter for closed fracture with routine healing: Secondary | ICD-10-CM | POA: Diagnosis not present

## 2023-04-12 DIAGNOSIS — I719 Aortic aneurysm of unspecified site, without rupture: Secondary | ICD-10-CM | POA: Diagnosis not present

## 2023-04-12 DIAGNOSIS — M8580 Other specified disorders of bone density and structure, unspecified site: Secondary | ICD-10-CM | POA: Diagnosis not present

## 2023-04-12 DIAGNOSIS — S82252D Displaced comminuted fracture of shaft of left tibia, subsequent encounter for closed fracture with routine healing: Secondary | ICD-10-CM | POA: Diagnosis not present

## 2023-04-12 DIAGNOSIS — S72462D Displaced supracondylar fracture with intracondylar extension of lower end of left femur, subsequent encounter for closed fracture with routine healing: Secondary | ICD-10-CM | POA: Diagnosis not present

## 2023-04-12 DIAGNOSIS — G250 Essential tremor: Secondary | ICD-10-CM | POA: Diagnosis not present

## 2023-04-12 DIAGNOSIS — E785 Hyperlipidemia, unspecified: Secondary | ICD-10-CM | POA: Diagnosis not present

## 2023-04-17 ENCOUNTER — Other Ambulatory Visit: Payer: Self-pay | Admitting: Nurse Practitioner

## 2023-04-17 DIAGNOSIS — G250 Essential tremor: Secondary | ICD-10-CM

## 2023-04-18 ENCOUNTER — Other Ambulatory Visit: Payer: Self-pay | Admitting: Internal Medicine

## 2023-04-18 DIAGNOSIS — E039 Hypothyroidism, unspecified: Secondary | ICD-10-CM

## 2023-04-18 MED ORDER — LEVOTHYROXINE SODIUM 100 MCG PO TABS: ORAL_TABLET | ORAL | 1 refills | Status: DC

## 2023-04-19 ENCOUNTER — Ambulatory Visit (HOSPITAL_COMMUNITY)
Admission: RE | Admit: 2023-04-19 | Discharge: 2023-04-19 | Disposition: A | Discharge status: Home / Self Care | Payer: Medicare Other | Source: Ambulatory Visit | Attending: Student | Admitting: Student

## 2023-04-19 DIAGNOSIS — S72402A Unspecified fracture of lower end of left femur, initial encounter for closed fracture: Secondary | ICD-10-CM | POA: Diagnosis not present

## 2023-04-19 DIAGNOSIS — Z4789 Encounter for other orthopedic aftercare: Secondary | ICD-10-CM | POA: Diagnosis not present

## 2023-04-19 DIAGNOSIS — S72462D Displaced supracondylar fracture with intracondylar extension of lower end of left femur, subsequent encounter for closed fracture with routine healing: Secondary | ICD-10-CM | POA: Diagnosis not present

## 2023-04-20 DIAGNOSIS — M8580 Other specified disorders of bone density and structure, unspecified site: Secondary | ICD-10-CM | POA: Diagnosis not present

## 2023-04-20 DIAGNOSIS — M069 Rheumatoid arthritis, unspecified: Secondary | ICD-10-CM | POA: Diagnosis not present

## 2023-04-20 DIAGNOSIS — M5030 Other cervical disc degeneration, unspecified cervical region: Secondary | ICD-10-CM | POA: Diagnosis not present

## 2023-04-20 DIAGNOSIS — Z7982 Long term (current) use of aspirin: Secondary | ICD-10-CM | POA: Diagnosis not present

## 2023-04-20 DIAGNOSIS — R7303 Prediabetes: Secondary | ICD-10-CM | POA: Diagnosis not present

## 2023-04-20 DIAGNOSIS — S72492D Other fracture of lower end of left femur, subsequent encounter for closed fracture with routine healing: Secondary | ICD-10-CM | POA: Diagnosis not present

## 2023-04-20 DIAGNOSIS — I10 Essential (primary) hypertension: Secondary | ICD-10-CM | POA: Diagnosis not present

## 2023-04-20 DIAGNOSIS — E785 Hyperlipidemia, unspecified: Secondary | ICD-10-CM | POA: Diagnosis not present

## 2023-04-20 DIAGNOSIS — I719 Aortic aneurysm of unspecified site, without rupture: Secondary | ICD-10-CM | POA: Diagnosis not present

## 2023-04-20 DIAGNOSIS — E039 Hypothyroidism, unspecified: Secondary | ICD-10-CM | POA: Diagnosis not present

## 2023-04-20 DIAGNOSIS — K219 Gastro-esophageal reflux disease without esophagitis: Secondary | ICD-10-CM | POA: Diagnosis not present

## 2023-04-20 DIAGNOSIS — S82252D Displaced comminuted fracture of shaft of left tibia, subsequent encounter for closed fracture with routine healing: Secondary | ICD-10-CM | POA: Diagnosis not present

## 2023-04-20 DIAGNOSIS — Z9181 History of falling: Secondary | ICD-10-CM | POA: Diagnosis not present

## 2023-04-20 DIAGNOSIS — S72462D Displaced supracondylar fracture with intracondylar extension of lower end of left femur, subsequent encounter for closed fracture with routine healing: Secondary | ICD-10-CM | POA: Diagnosis not present

## 2023-04-20 DIAGNOSIS — E538 Deficiency of other specified B group vitamins: Secondary | ICD-10-CM | POA: Diagnosis not present

## 2023-04-20 DIAGNOSIS — G250 Essential tremor: Secondary | ICD-10-CM | POA: Diagnosis not present

## 2023-04-21 DIAGNOSIS — S72462D Displaced supracondylar fracture with intracondylar extension of lower end of left femur, subsequent encounter for closed fracture with routine healing: Secondary | ICD-10-CM | POA: Diagnosis not present

## 2023-04-21 DIAGNOSIS — I719 Aortic aneurysm of unspecified site, without rupture: Secondary | ICD-10-CM | POA: Diagnosis not present

## 2023-04-21 DIAGNOSIS — K219 Gastro-esophageal reflux disease without esophagitis: Secondary | ICD-10-CM | POA: Diagnosis not present

## 2023-04-21 DIAGNOSIS — M069 Rheumatoid arthritis, unspecified: Secondary | ICD-10-CM | POA: Diagnosis not present

## 2023-04-21 DIAGNOSIS — R7303 Prediabetes: Secondary | ICD-10-CM | POA: Diagnosis not present

## 2023-04-21 DIAGNOSIS — G250 Essential tremor: Secondary | ICD-10-CM | POA: Diagnosis not present

## 2023-04-21 DIAGNOSIS — E039 Hypothyroidism, unspecified: Secondary | ICD-10-CM | POA: Diagnosis not present

## 2023-04-21 DIAGNOSIS — S82252D Displaced comminuted fracture of shaft of left tibia, subsequent encounter for closed fracture with routine healing: Secondary | ICD-10-CM | POA: Diagnosis not present

## 2023-04-21 DIAGNOSIS — Z9181 History of falling: Secondary | ICD-10-CM | POA: Diagnosis not present

## 2023-04-21 DIAGNOSIS — M5030 Other cervical disc degeneration, unspecified cervical region: Secondary | ICD-10-CM | POA: Diagnosis not present

## 2023-04-21 DIAGNOSIS — I10 Essential (primary) hypertension: Secondary | ICD-10-CM | POA: Diagnosis not present

## 2023-04-21 DIAGNOSIS — Z7982 Long term (current) use of aspirin: Secondary | ICD-10-CM | POA: Diagnosis not present

## 2023-04-21 DIAGNOSIS — E785 Hyperlipidemia, unspecified: Secondary | ICD-10-CM | POA: Diagnosis not present

## 2023-04-21 DIAGNOSIS — S72492D Other fracture of lower end of left femur, subsequent encounter for closed fracture with routine healing: Secondary | ICD-10-CM | POA: Diagnosis not present

## 2023-04-21 DIAGNOSIS — E538 Deficiency of other specified B group vitamins: Secondary | ICD-10-CM | POA: Diagnosis not present

## 2023-04-21 DIAGNOSIS — M8580 Other specified disorders of bone density and structure, unspecified site: Secondary | ICD-10-CM | POA: Diagnosis not present

## 2023-04-27 DIAGNOSIS — S72462D Displaced supracondylar fracture with intracondylar extension of lower end of left femur, subsequent encounter for closed fracture with routine healing: Secondary | ICD-10-CM | POA: Diagnosis not present

## 2023-04-27 DIAGNOSIS — K219 Gastro-esophageal reflux disease without esophagitis: Secondary | ICD-10-CM | POA: Diagnosis not present

## 2023-04-27 DIAGNOSIS — I719 Aortic aneurysm of unspecified site, without rupture: Secondary | ICD-10-CM | POA: Diagnosis not present

## 2023-04-27 DIAGNOSIS — G250 Essential tremor: Secondary | ICD-10-CM | POA: Diagnosis not present

## 2023-04-27 DIAGNOSIS — Z9181 History of falling: Secondary | ICD-10-CM | POA: Diagnosis not present

## 2023-04-27 DIAGNOSIS — Z7982 Long term (current) use of aspirin: Secondary | ICD-10-CM | POA: Diagnosis not present

## 2023-04-27 DIAGNOSIS — S82252D Displaced comminuted fracture of shaft of left tibia, subsequent encounter for closed fracture with routine healing: Secondary | ICD-10-CM | POA: Diagnosis not present

## 2023-04-27 DIAGNOSIS — S72492D Other fracture of lower end of left femur, subsequent encounter for closed fracture with routine healing: Secondary | ICD-10-CM | POA: Diagnosis not present

## 2023-04-27 DIAGNOSIS — R7303 Prediabetes: Secondary | ICD-10-CM | POA: Diagnosis not present

## 2023-04-27 DIAGNOSIS — M069 Rheumatoid arthritis, unspecified: Secondary | ICD-10-CM | POA: Diagnosis not present

## 2023-04-27 DIAGNOSIS — M8580 Other specified disorders of bone density and structure, unspecified site: Secondary | ICD-10-CM | POA: Diagnosis not present

## 2023-04-27 DIAGNOSIS — E039 Hypothyroidism, unspecified: Secondary | ICD-10-CM | POA: Diagnosis not present

## 2023-04-27 DIAGNOSIS — E785 Hyperlipidemia, unspecified: Secondary | ICD-10-CM | POA: Diagnosis not present

## 2023-04-27 DIAGNOSIS — M5030 Other cervical disc degeneration, unspecified cervical region: Secondary | ICD-10-CM | POA: Diagnosis not present

## 2023-04-27 DIAGNOSIS — I10 Essential (primary) hypertension: Secondary | ICD-10-CM | POA: Diagnosis not present

## 2023-04-27 DIAGNOSIS — E538 Deficiency of other specified B group vitamins: Secondary | ICD-10-CM | POA: Diagnosis not present

## 2023-05-02 DIAGNOSIS — R7303 Prediabetes: Secondary | ICD-10-CM | POA: Diagnosis not present

## 2023-05-02 DIAGNOSIS — Z9181 History of falling: Secondary | ICD-10-CM | POA: Diagnosis not present

## 2023-05-02 DIAGNOSIS — S72492D Other fracture of lower end of left femur, subsequent encounter for closed fracture with routine healing: Secondary | ICD-10-CM | POA: Diagnosis not present

## 2023-05-02 DIAGNOSIS — M5030 Other cervical disc degeneration, unspecified cervical region: Secondary | ICD-10-CM | POA: Diagnosis not present

## 2023-05-02 DIAGNOSIS — E039 Hypothyroidism, unspecified: Secondary | ICD-10-CM | POA: Diagnosis not present

## 2023-05-02 DIAGNOSIS — E785 Hyperlipidemia, unspecified: Secondary | ICD-10-CM | POA: Diagnosis not present

## 2023-05-02 DIAGNOSIS — S72462D Displaced supracondylar fracture with intracondylar extension of lower end of left femur, subsequent encounter for closed fracture with routine healing: Secondary | ICD-10-CM | POA: Diagnosis not present

## 2023-05-02 DIAGNOSIS — M8580 Other specified disorders of bone density and structure, unspecified site: Secondary | ICD-10-CM | POA: Diagnosis not present

## 2023-05-02 DIAGNOSIS — S82252D Displaced comminuted fracture of shaft of left tibia, subsequent encounter for closed fracture with routine healing: Secondary | ICD-10-CM | POA: Diagnosis not present

## 2023-05-02 DIAGNOSIS — E538 Deficiency of other specified B group vitamins: Secondary | ICD-10-CM | POA: Diagnosis not present

## 2023-05-02 DIAGNOSIS — K219 Gastro-esophageal reflux disease without esophagitis: Secondary | ICD-10-CM | POA: Diagnosis not present

## 2023-05-02 DIAGNOSIS — I719 Aortic aneurysm of unspecified site, without rupture: Secondary | ICD-10-CM | POA: Diagnosis not present

## 2023-05-02 DIAGNOSIS — I10 Essential (primary) hypertension: Secondary | ICD-10-CM | POA: Diagnosis not present

## 2023-05-02 DIAGNOSIS — G250 Essential tremor: Secondary | ICD-10-CM | POA: Diagnosis not present

## 2023-05-02 DIAGNOSIS — Z7982 Long term (current) use of aspirin: Secondary | ICD-10-CM | POA: Diagnosis not present

## 2023-05-02 DIAGNOSIS — M069 Rheumatoid arthritis, unspecified: Secondary | ICD-10-CM | POA: Diagnosis not present

## 2023-05-03 DIAGNOSIS — S82252D Displaced comminuted fracture of shaft of left tibia, subsequent encounter for closed fracture with routine healing: Secondary | ICD-10-CM | POA: Diagnosis not present

## 2023-05-03 DIAGNOSIS — K219 Gastro-esophageal reflux disease without esophagitis: Secondary | ICD-10-CM | POA: Diagnosis not present

## 2023-05-03 DIAGNOSIS — G250 Essential tremor: Secondary | ICD-10-CM | POA: Diagnosis not present

## 2023-05-03 DIAGNOSIS — Z7982 Long term (current) use of aspirin: Secondary | ICD-10-CM | POA: Diagnosis not present

## 2023-05-03 DIAGNOSIS — S72462D Displaced supracondylar fracture with intracondylar extension of lower end of left femur, subsequent encounter for closed fracture with routine healing: Secondary | ICD-10-CM | POA: Diagnosis not present

## 2023-05-03 DIAGNOSIS — I10 Essential (primary) hypertension: Secondary | ICD-10-CM | POA: Diagnosis not present

## 2023-05-03 DIAGNOSIS — E039 Hypothyroidism, unspecified: Secondary | ICD-10-CM | POA: Diagnosis not present

## 2023-05-03 DIAGNOSIS — E538 Deficiency of other specified B group vitamins: Secondary | ICD-10-CM | POA: Diagnosis not present

## 2023-05-03 DIAGNOSIS — I719 Aortic aneurysm of unspecified site, without rupture: Secondary | ICD-10-CM | POA: Diagnosis not present

## 2023-05-03 DIAGNOSIS — M8580 Other specified disorders of bone density and structure, unspecified site: Secondary | ICD-10-CM | POA: Diagnosis not present

## 2023-05-03 DIAGNOSIS — M069 Rheumatoid arthritis, unspecified: Secondary | ICD-10-CM | POA: Diagnosis not present

## 2023-05-03 DIAGNOSIS — M5030 Other cervical disc degeneration, unspecified cervical region: Secondary | ICD-10-CM | POA: Diagnosis not present

## 2023-05-03 DIAGNOSIS — Z9181 History of falling: Secondary | ICD-10-CM | POA: Diagnosis not present

## 2023-05-03 DIAGNOSIS — R7303 Prediabetes: Secondary | ICD-10-CM | POA: Diagnosis not present

## 2023-05-03 DIAGNOSIS — S72492D Other fracture of lower end of left femur, subsequent encounter for closed fracture with routine healing: Secondary | ICD-10-CM | POA: Diagnosis not present

## 2023-05-03 DIAGNOSIS — E785 Hyperlipidemia, unspecified: Secondary | ICD-10-CM | POA: Diagnosis not present

## 2023-05-04 DIAGNOSIS — M6281 Muscle weakness (generalized): Secondary | ICD-10-CM | POA: Diagnosis not present

## 2023-05-04 DIAGNOSIS — S72492D Other fracture of lower end of left femur, subsequent encounter for closed fracture with routine healing: Secondary | ICD-10-CM | POA: Diagnosis not present

## 2023-05-04 DIAGNOSIS — S72492A Other fracture of lower end of left femur, initial encounter for closed fracture: Secondary | ICD-10-CM | POA: Diagnosis not present

## 2023-05-04 DIAGNOSIS — M069 Rheumatoid arthritis, unspecified: Secondary | ICD-10-CM | POA: Diagnosis not present

## 2023-05-05 DIAGNOSIS — D0439 Carcinoma in situ of skin of other parts of face: Secondary | ICD-10-CM | POA: Diagnosis not present

## 2023-05-10 DIAGNOSIS — G250 Essential tremor: Secondary | ICD-10-CM | POA: Diagnosis not present

## 2023-05-10 DIAGNOSIS — S82252D Displaced comminuted fracture of shaft of left tibia, subsequent encounter for closed fracture with routine healing: Secondary | ICD-10-CM | POA: Diagnosis not present

## 2023-05-10 DIAGNOSIS — E039 Hypothyroidism, unspecified: Secondary | ICD-10-CM | POA: Diagnosis not present

## 2023-05-10 DIAGNOSIS — Z7982 Long term (current) use of aspirin: Secondary | ICD-10-CM | POA: Diagnosis not present

## 2023-05-10 DIAGNOSIS — S72492D Other fracture of lower end of left femur, subsequent encounter for closed fracture with routine healing: Secondary | ICD-10-CM | POA: Diagnosis not present

## 2023-05-10 DIAGNOSIS — M6281 Muscle weakness (generalized): Secondary | ICD-10-CM | POA: Diagnosis not present

## 2023-05-10 DIAGNOSIS — S72492A Other fracture of lower end of left femur, initial encounter for closed fracture: Secondary | ICD-10-CM | POA: Diagnosis not present

## 2023-05-10 DIAGNOSIS — K219 Gastro-esophageal reflux disease without esophagitis: Secondary | ICD-10-CM | POA: Diagnosis not present

## 2023-05-10 DIAGNOSIS — R7303 Prediabetes: Secondary | ICD-10-CM | POA: Diagnosis not present

## 2023-05-10 DIAGNOSIS — M069 Rheumatoid arthritis, unspecified: Secondary | ICD-10-CM | POA: Diagnosis not present

## 2023-05-10 DIAGNOSIS — E538 Deficiency of other specified B group vitamins: Secondary | ICD-10-CM | POA: Diagnosis not present

## 2023-05-10 DIAGNOSIS — S72462D Displaced supracondylar fracture with intracondylar extension of lower end of left femur, subsequent encounter for closed fracture with routine healing: Secondary | ICD-10-CM | POA: Diagnosis not present

## 2023-05-10 DIAGNOSIS — I10 Essential (primary) hypertension: Secondary | ICD-10-CM | POA: Diagnosis not present

## 2023-05-10 DIAGNOSIS — E785 Hyperlipidemia, unspecified: Secondary | ICD-10-CM | POA: Diagnosis not present

## 2023-05-10 DIAGNOSIS — M8580 Other specified disorders of bone density and structure, unspecified site: Secondary | ICD-10-CM | POA: Diagnosis not present

## 2023-05-10 DIAGNOSIS — I719 Aortic aneurysm of unspecified site, without rupture: Secondary | ICD-10-CM | POA: Diagnosis not present

## 2023-05-10 DIAGNOSIS — M5030 Other cervical disc degeneration, unspecified cervical region: Secondary | ICD-10-CM | POA: Diagnosis not present

## 2023-05-10 DIAGNOSIS — Z9181 History of falling: Secondary | ICD-10-CM | POA: Diagnosis not present

## 2023-05-15 DIAGNOSIS — M25562 Pain in left knee: Secondary | ICD-10-CM | POA: Diagnosis not present

## 2023-05-16 DIAGNOSIS — M1991 Primary osteoarthritis, unspecified site: Secondary | ICD-10-CM | POA: Diagnosis not present

## 2023-05-16 DIAGNOSIS — M0579 Rheumatoid arthritis with rheumatoid factor of multiple sites without organ or systems involvement: Secondary | ICD-10-CM | POA: Diagnosis not present

## 2023-05-16 DIAGNOSIS — M810 Age-related osteoporosis without current pathological fracture: Secondary | ICD-10-CM | POA: Diagnosis not present

## 2023-05-17 DIAGNOSIS — M8580 Other specified disorders of bone density and structure, unspecified site: Secondary | ICD-10-CM | POA: Diagnosis not present

## 2023-05-17 DIAGNOSIS — M5030 Other cervical disc degeneration, unspecified cervical region: Secondary | ICD-10-CM | POA: Diagnosis not present

## 2023-05-17 DIAGNOSIS — S72492D Other fracture of lower end of left femur, subsequent encounter for closed fracture with routine healing: Secondary | ICD-10-CM | POA: Diagnosis not present

## 2023-05-17 DIAGNOSIS — R7303 Prediabetes: Secondary | ICD-10-CM | POA: Diagnosis not present

## 2023-05-17 DIAGNOSIS — Z7982 Long term (current) use of aspirin: Secondary | ICD-10-CM | POA: Diagnosis not present

## 2023-05-17 DIAGNOSIS — I719 Aortic aneurysm of unspecified site, without rupture: Secondary | ICD-10-CM | POA: Diagnosis not present

## 2023-05-17 DIAGNOSIS — E039 Hypothyroidism, unspecified: Secondary | ICD-10-CM | POA: Diagnosis not present

## 2023-05-17 DIAGNOSIS — Z9181 History of falling: Secondary | ICD-10-CM | POA: Diagnosis not present

## 2023-05-17 DIAGNOSIS — S82252D Displaced comminuted fracture of shaft of left tibia, subsequent encounter for closed fracture with routine healing: Secondary | ICD-10-CM | POA: Diagnosis not present

## 2023-05-17 DIAGNOSIS — G250 Essential tremor: Secondary | ICD-10-CM | POA: Diagnosis not present

## 2023-05-17 DIAGNOSIS — E785 Hyperlipidemia, unspecified: Secondary | ICD-10-CM | POA: Diagnosis not present

## 2023-05-17 DIAGNOSIS — I10 Essential (primary) hypertension: Secondary | ICD-10-CM | POA: Diagnosis not present

## 2023-05-17 DIAGNOSIS — E538 Deficiency of other specified B group vitamins: Secondary | ICD-10-CM | POA: Diagnosis not present

## 2023-05-17 DIAGNOSIS — S72462D Displaced supracondylar fracture with intracondylar extension of lower end of left femur, subsequent encounter for closed fracture with routine healing: Secondary | ICD-10-CM | POA: Diagnosis not present

## 2023-05-17 DIAGNOSIS — M069 Rheumatoid arthritis, unspecified: Secondary | ICD-10-CM | POA: Diagnosis not present

## 2023-05-17 DIAGNOSIS — K219 Gastro-esophageal reflux disease without esophagitis: Secondary | ICD-10-CM | POA: Diagnosis not present

## 2023-05-18 ENCOUNTER — Other Ambulatory Visit: Payer: Self-pay | Admitting: Internal Medicine

## 2023-05-18 ENCOUNTER — Other Ambulatory Visit: Payer: Self-pay

## 2023-05-18 DIAGNOSIS — E538 Deficiency of other specified B group vitamins: Secondary | ICD-10-CM | POA: Diagnosis not present

## 2023-05-18 DIAGNOSIS — M8580 Other specified disorders of bone density and structure, unspecified site: Secondary | ICD-10-CM | POA: Diagnosis not present

## 2023-05-18 DIAGNOSIS — S72462D Displaced supracondylar fracture with intracondylar extension of lower end of left femur, subsequent encounter for closed fracture with routine healing: Secondary | ICD-10-CM | POA: Diagnosis not present

## 2023-05-18 DIAGNOSIS — R7303 Prediabetes: Secondary | ICD-10-CM | POA: Diagnosis not present

## 2023-05-18 DIAGNOSIS — E785 Hyperlipidemia, unspecified: Secondary | ICD-10-CM | POA: Diagnosis not present

## 2023-05-18 DIAGNOSIS — I719 Aortic aneurysm of unspecified site, without rupture: Secondary | ICD-10-CM | POA: Diagnosis not present

## 2023-05-18 DIAGNOSIS — G250 Essential tremor: Secondary | ICD-10-CM

## 2023-05-18 DIAGNOSIS — S82252D Displaced comminuted fracture of shaft of left tibia, subsequent encounter for closed fracture with routine healing: Secondary | ICD-10-CM | POA: Diagnosis not present

## 2023-05-18 DIAGNOSIS — M5030 Other cervical disc degeneration, unspecified cervical region: Secondary | ICD-10-CM | POA: Diagnosis not present

## 2023-05-18 DIAGNOSIS — M069 Rheumatoid arthritis, unspecified: Secondary | ICD-10-CM | POA: Diagnosis not present

## 2023-05-18 DIAGNOSIS — K219 Gastro-esophageal reflux disease without esophagitis: Secondary | ICD-10-CM | POA: Diagnosis not present

## 2023-05-18 DIAGNOSIS — E039 Hypothyroidism, unspecified: Secondary | ICD-10-CM | POA: Diagnosis not present

## 2023-05-18 DIAGNOSIS — S72492D Other fracture of lower end of left femur, subsequent encounter for closed fracture with routine healing: Secondary | ICD-10-CM | POA: Diagnosis not present

## 2023-05-18 DIAGNOSIS — I10 Essential (primary) hypertension: Secondary | ICD-10-CM | POA: Diagnosis not present

## 2023-05-18 DIAGNOSIS — Z7982 Long term (current) use of aspirin: Secondary | ICD-10-CM | POA: Diagnosis not present

## 2023-05-18 DIAGNOSIS — Z9181 History of falling: Secondary | ICD-10-CM | POA: Diagnosis not present

## 2023-05-18 MED ORDER — PROPRANOLOL HCL 80 MG PO TABS: ORAL_TABLET | ORAL | 3 refills | Status: DC

## 2023-05-31 DIAGNOSIS — D485 Neoplasm of uncertain behavior of skin: Secondary | ICD-10-CM | POA: Diagnosis not present

## 2023-05-31 DIAGNOSIS — C44321 Squamous cell carcinoma of skin of nose: Secondary | ICD-10-CM | POA: Diagnosis not present

## 2023-05-31 DIAGNOSIS — D044 Carcinoma in situ of skin of scalp and neck: Secondary | ICD-10-CM | POA: Diagnosis not present

## 2023-05-31 DIAGNOSIS — R6889 Other general symptoms and signs: Secondary | ICD-10-CM | POA: Diagnosis not present

## 2023-06-15 DIAGNOSIS — D649 Anemia, unspecified: Secondary | ICD-10-CM | POA: Diagnosis not present

## 2023-06-15 DIAGNOSIS — S72462D Displaced supracondylar fracture with intracondylar extension of lower end of left femur, subsequent encounter for closed fracture with routine healing: Secondary | ICD-10-CM | POA: Diagnosis not present

## 2023-06-15 DIAGNOSIS — M069 Rheumatoid arthritis, unspecified: Secondary | ICD-10-CM | POA: Diagnosis not present

## 2023-06-15 DIAGNOSIS — I10 Essential (primary) hypertension: Secondary | ICD-10-CM | POA: Diagnosis not present

## 2023-06-15 DIAGNOSIS — F1320 Sedative, hypnotic or anxiolytic dependence, uncomplicated: Secondary | ICD-10-CM | POA: Diagnosis not present

## 2023-06-15 DIAGNOSIS — K5790 Diverticulosis of intestine, part unspecified, without perforation or abscess without bleeding: Secondary | ICD-10-CM | POA: Diagnosis not present

## 2023-06-15 DIAGNOSIS — S82252D Displaced comminuted fracture of shaft of left tibia, subsequent encounter for closed fracture with routine healing: Secondary | ICD-10-CM | POA: Diagnosis not present

## 2023-06-15 DIAGNOSIS — I700 Atherosclerosis of aorta: Secondary | ICD-10-CM | POA: Diagnosis not present

## 2023-06-15 DIAGNOSIS — E538 Deficiency of other specified B group vitamins: Secondary | ICD-10-CM | POA: Diagnosis not present

## 2023-06-20 DIAGNOSIS — E782 Mixed hyperlipidemia: Secondary | ICD-10-CM | POA: Diagnosis not present

## 2023-06-20 DIAGNOSIS — R6889 Other general symptoms and signs: Secondary | ICD-10-CM | POA: Diagnosis not present

## 2023-06-20 DIAGNOSIS — S72462D Displaced supracondylar fracture with intracondylar extension of lower end of left femur, subsequent encounter for closed fracture with routine healing: Secondary | ICD-10-CM | POA: Diagnosis not present

## 2023-06-20 DIAGNOSIS — I10 Essential (primary) hypertension: Secondary | ICD-10-CM | POA: Diagnosis not present

## 2023-06-21 DIAGNOSIS — S82252D Displaced comminuted fracture of shaft of left tibia, subsequent encounter for closed fracture with routine healing: Secondary | ICD-10-CM | POA: Diagnosis not present

## 2023-06-21 DIAGNOSIS — K5790 Diverticulosis of intestine, part unspecified, without perforation or abscess without bleeding: Secondary | ICD-10-CM | POA: Diagnosis not present

## 2023-06-21 DIAGNOSIS — I700 Atherosclerosis of aorta: Secondary | ICD-10-CM | POA: Diagnosis not present

## 2023-06-21 DIAGNOSIS — D649 Anemia, unspecified: Secondary | ICD-10-CM | POA: Diagnosis not present

## 2023-06-21 DIAGNOSIS — E538 Deficiency of other specified B group vitamins: Secondary | ICD-10-CM | POA: Diagnosis not present

## 2023-06-21 DIAGNOSIS — S72462D Displaced supracondylar fracture with intracondylar extension of lower end of left femur, subsequent encounter for closed fracture with routine healing: Secondary | ICD-10-CM | POA: Diagnosis not present

## 2023-06-21 DIAGNOSIS — M069 Rheumatoid arthritis, unspecified: Secondary | ICD-10-CM | POA: Diagnosis not present

## 2023-06-21 DIAGNOSIS — F1320 Sedative, hypnotic or anxiolytic dependence, uncomplicated: Secondary | ICD-10-CM | POA: Diagnosis not present

## 2023-06-21 DIAGNOSIS — I10 Essential (primary) hypertension: Secondary | ICD-10-CM | POA: Diagnosis not present

## 2023-06-23 DIAGNOSIS — S82252D Displaced comminuted fracture of shaft of left tibia, subsequent encounter for closed fracture with routine healing: Secondary | ICD-10-CM | POA: Diagnosis not present

## 2023-06-23 DIAGNOSIS — S72462D Displaced supracondylar fracture with intracondylar extension of lower end of left femur, subsequent encounter for closed fracture with routine healing: Secondary | ICD-10-CM | POA: Diagnosis not present

## 2023-06-23 DIAGNOSIS — E538 Deficiency of other specified B group vitamins: Secondary | ICD-10-CM | POA: Diagnosis not present

## 2023-06-23 DIAGNOSIS — D649 Anemia, unspecified: Secondary | ICD-10-CM | POA: Diagnosis not present

## 2023-06-23 DIAGNOSIS — M069 Rheumatoid arthritis, unspecified: Secondary | ICD-10-CM | POA: Diagnosis not present

## 2023-06-23 DIAGNOSIS — I10 Essential (primary) hypertension: Secondary | ICD-10-CM | POA: Diagnosis not present

## 2023-06-23 DIAGNOSIS — K5790 Diverticulosis of intestine, part unspecified, without perforation or abscess without bleeding: Secondary | ICD-10-CM | POA: Diagnosis not present

## 2023-06-23 DIAGNOSIS — I700 Atherosclerosis of aorta: Secondary | ICD-10-CM | POA: Diagnosis not present

## 2023-06-23 DIAGNOSIS — F1320 Sedative, hypnotic or anxiolytic dependence, uncomplicated: Secondary | ICD-10-CM | POA: Diagnosis not present

## 2023-06-26 NOTE — Progress Notes (Unsigned)
Annual Screening/Preventative Visit &  Comprehensive Evaluation &  Examination   Future Appointments  Date Time Provider Department  06/27/2023                       cpe  2:00 PM Lucky Cowboy, MD [facility]  07/13/2023                      wellness  4:00 PM Adela Glimpse, NP [facility]  07/03/2024                     cpe  2:00 PM Lucky Cowboy, MD [facility]         This very nice 82 y.o.WWF with  HTN, HLD, Hypothyroidism, Rheumatoid Arthritis,   Prediabetes  and Vitamin D Deficiency  presents for a Screening /Preventative Visit & comprehensive evaluation and management of multiple medical co-morbidities.   Patient has Aortic Atherosclerosis by CXR in 2020 and CT scan in 2021. Patient 's RA (2001) is felt in remission on MTX & Plaquenil followed by Dr Barbee Cough in [place]         HTN predates since 1998. Patient's BP has been controlled at home and patient denies any cardiac symptoms as chest pain, palpitations, shortness of breath, dizziness or ankle swelling. Today's BP is at goal -                         .        Patient's hyperlipidemia is controlled with diet. Last lipids were  at goal :  Lab Results  Component Value Date   CHOL 167 10/27/2022   HDL 63 10/27/2022   LDLCALC 86 10/27/2022   TRIG 88 10/27/2022   CHOLHDL 2.7 10/27/2022         Patient has hx/o prediabetes (2012) and patient denies reactive hypoglycemic symptoms, visual blurring, diabetic polys or paresthesias. Last A1c was normal & at goal .  Lab Results  Component Value Date   HGBA1C 5.0 04/05/2022         Patient was dx'd Hypothyroid in 1994 & initiated on replacement therapy.       Finally, patient has history of Vitamin D Deficiency ("17" /2009) and last Vitamin D was at goal (70-100) :  Lab Results  Component Value Date   VD25OH 76.46 01/10/2023     Current Outpatient Medications  Medication Instructions   acetaminophen (TYLENOL) 650 mg, Oral, Every 6 hours PRN   alendronate  (FOSAMAX) 70 mg, Oral, Weekly, Take with a full glass of water on an empty stomach.   apixaban (ELIQUIS) 2.5 mg, Oral, 2 times daily   Cyanocobalamin (VITAMIN B 12 PO) 1,000 mcg, Sublingual, Every evening   diazepam (VALIUM) 5 MG tablet Take  1 table5t  3 x / day for Hereditary / Familial Tremor   dicyclomine (BENTYL) 20 MG tablet TAKE 1 TABLET THREE TIMES DAILY BEFORE MEALS FOR CRAMPING, BLOATING, NAUSEA OR DIARRHEA   docusate sodium (COLACE) 100 mg, Oral, 2 times daily   folic acid (FOLVITE) 800 MCG tablet Take 1 tablet daily.   furosemide (LASIX) 40 MG tablet TAKE 1 TABLET TWICE DAILY FOR FLUID RETENTION AND ANKLE  SWELLING   HYDROcodone-acetaminophen (NORCO/VICODIN) 5-325 MG tablet 1 tablet, Oral, Every 4 hours PRN   hydrocortisone 2.5 % ointment Topical, 2 times daily   hydroxychloroquine (PLAQUENIL) 200 mg, Oral, See admin instructions   levothyroxine (SYNTHROID) 100 MCG tablet  Take 1/2 tablet on Mon and Friday and 1 whole tablet  other 5 days &  on an empty stomach with only water for 30 minutes & no Antacid meds, Calcium or Magnesium for 4 hours & avoid Biotin   methotrexate (RHEUMATREX) 10 mg, Oral, See admin instructions, Take 4 tablets (10 mg) by mouth twice weekly - Tuesday and Wednesday   montelukast (SINGULAIR) 10 MG tablet TAKE 1 TABLET EVERY DAY   Multiple Vitamin (MULTIVITAMIN WITH MINERALS) TABS tablet 1 tablet, Oral, Every evening   ondansetron (ZOFRAN ODT) 8 MG disintegrating tablet 8mg  ODT q4 hours prn nausea   polyethylene glycol (MIRALAX / GLYCOLAX) 17 g, Oral, Daily   propranolol (INDERAL) 80 MG tablet TAKE 1 TABLET TWICE DAILY FOR BLOOD PRESSURE AND TREMORS   triamcinolone (KENALOG) 0.025 % ointment 1 Application, Topical, 2 times daily   Vitamin D 10,000 Units, Oral, Every evening      Allergies  Allergen Reactions   Codeine Nausea Only   Mysoline [Primidone] Nausea Only and Other (See Comments)    Over-sedated   Sulfa Antibiotics Itching     Past Medical  History:  Diagnosis Date   Anemia    Cataract    Diverticulosis 09/03/2020   Environmental allergies    Gastric ulcer    Heartburn    Hiatal hernia    Hyperlipidemia    Hypertension    Hypothyroidism    Migraines    Prediabetes    Rash october 2012   all over abdomen and breasts   Rhabdomyolysis, traumatic   12/09/2020 12/09/2020   Rheumatoid arthritis(714.0)    Tremor, essential    right hand     Immunization History  Administered Date(s) Administered   DT (Pediatric) 02/05/2015   Influenza Split 09/12/2013   Influenza, High Dose  10/28/2014, 08/18/2016, 11/22/2018, 12/09/2019, 10/06/2020, 11/03/2021   Pneumococcal -13 05/12/2015   Pneumococcal-23 11/21/2006   Td 11/22/2003   Tdap 06/26/2021   Varicella 06/23/2011   Zoster, Live 06/23/2011    Last Colon - 11/12/2-013 - Dr Arlyce Dice - deferred  F/U due to age.   Last MGM - 09/10/2019 - overdue   Past Surgical History:  Procedure Laterality Date   ABDOMINAL HYSTERECTOMY     CATARACT EXTRACTION, BILATERAL     CHOLECYSTECTOMY  08/31/11   lap chole    COLONOSCOPY     TUBAL LIGATION       Family History  Problem Relation Age of Onset   Heart disease Mother    Diabetes Mother    Heart disease Father    Heart disease Maternal Aunt        x3    Heart disease Maternal Uncle        x 4   Diabetes Sister        borderline   Heart attack Son 5       Sudden cardiac death   Colon cancer Neg Hx      Social History   Tobacco Use   Smoking status: Former    Types: Cigarettes    Quit date: 09/18/1984    Years since quitting: 37.5   Smokeless tobacco: Never   Tobacco comments:    stopped around 25 years ago  Substance Use Topics   Alcohol use: No   Drug use: No      ROS Constitutional: Denies fever, chills, weight loss/gain, headaches, insomnia,  night sweats, and change in appetite. Does c/o fatigue. Eyes: Denies redness, blurred vision, diplopia, discharge, itchy, watery eyes.  ENT: Denies  discharge, congestion, post nasal drip, epistaxis, sore throat, earache, hearing loss, dental pain, Tinnitus, Vertigo, Sinus pain, snoring.  Cardio: Denies chest pain, palpitations, irregular heartbeat, syncope, dyspnea, diaphoresis, orthopnea, PND, claudication, edema Respiratory: denies cough, dyspnea, DOE, pleurisy, hoarseness, laryngitis, wheezing.  Gastrointestinal: Denies dysphagia, heartburn, reflux, water brash, pain, cramps, nausea, vomiting, bloating, diarrhea, constipation, hematemesis, melena, hematochezia, jaundice, hemorrhoids Genitourinary: Denies dysuria, frequency, urgency, nocturia, hesitancy, discharge, hematuria, flank pain Breast: Breast lumps, nipple discharge, bleeding.  Musculoskeletal: Denies arthralgia, myalgia, stiffness, Jt. Swelling, pain, limp, and strain/sprain. Denies falls. Skin: Denies puritis, rash, hives, warts, acne, eczema, changing in skin lesion Neuro: No weakness, tremor, incoordination, spasms, paresthesia, pain Psychiatric: Denies confusion, memory loss, sensory loss. Denies Depression. Endocrine: Denies change in weight, skin, hair change, nocturia, and paresthesia, diabetic polys, visual blurring, hyper / hypo glycemic episodes.  Heme/Lymph: No excessive bleeding, bruising, enlarged lymph nodes.  Physical Exam  There were no vitals taken for this visit.  General Appearance: Well nourished, well groomed and in no apparent distress.  Eyes: PERRLA, EOMs, conjunctiva no swelling or erythema, normal fundi and vessels. Sinuses: No frontal/maxillary tenderness ENT/Mouth: EACs patent / TMs  nl. Nares clear without erythema, swelling, mucoid exudates. Oral hygiene is good. No erythema, swelling, or exudate. Tongue normal, non-obstructing. Tonsils not swollen or erythematous. Hearing normal.  Neck: Supple, thyroid not palpable. No bruits, nodes or JVD. Respiratory: Respiratory effort normal.  BS equal and clear bilateral without rales, rhonci, wheezing or  stridor. Cardio: Heart sounds are normal with regular rate and rhythm and no murmurs, rubs or gallops. Peripheral pulses are normal and equal bilaterally without edema. No aortic or femoral bruits. Chest: symmetric with normal excursions and percussion. Breasts: Deferred to MGM Abdomen: Flat, soft with bowel sounds active. Nontender, no guarding, rebound, hernias, masses, or organomegaly.  Lymphatics: Non tender without lymphadenopathy.  Musculoskeletal: Full ROM all peripheral extremities, joint stability, 5/5 strength, and normal gait. Skin: Warm and dry without rashes, lesions, cyanosis, clubbing or  ecchymosis.  Neuro: Cranial nerves intact, reflexes equal bilaterally. Normal muscle tone, no cerebellar symptoms.                Mild head titubation and high frequency low amplitude tremor tremor of hands.  Sensation intact.  Pysch: Alert and oriented X 3, normal affect, Insight and Judgment appropriate.     1. Annual Preventative Screening Examination   2. Essential hypertension  - Microalbumin / creatinine urine ratio - CBC with Differential/Platelet - COMPLETE METABOLIC PANEL WITH GFR - Magnesium - TSH   3. Hyperlipidemia, mixed  - EKG 12-Lead - Lipid panel - TSH   4. Abnormal glucose  - Hemoglobin A1c - Insulin, random   5. Vitamin D deficiency  - VITAMIN D 25 Hydroxy (Vit-D Deficiency, Fractures)   6. Aortic atherosclerosis (HCC)  - EKG 12-Lead - Lipid panel   7. Hypothyroidism, unspecified type  - TSH   8. Hereditary essential tremor   9. Screening for heart disease  - EKG 12-Lead   10. FHx: heart disease  - EKG 12-Lead   11. Former smoker  - EKG 12-Lead   12. Screening for colorectal cancer  - POC Hemoccult Bld/Stl   13. Medication management  - Urinalysis, Routine w reflex microscopic - Microalbumin / creatinine urine ratio - CBC with Differential/Platelet - COMPLETE METABOLIC PANEL WITH GFR - Magnesium - Lipid panel -  TSH - Hemoglobin A1c - Insulin, random - VITAMIN D 25 Hydrox  Patient was counseled in prudent diet to achieve/maintain BMI less than 25 for weight control, BP monitoring, regular exercise and medications. Discussed med's effects and SE's. Screening labs and tests as requested with regular follow-up as recommended. Over 40 minutes of exam, counseling, chart review and high complex critical decision making was performed.   Marinus Maw, MD

## 2023-06-26 NOTE — Patient Instructions (Signed)
Due to recent changes in healthcare laws, you may see the results of your imaging and laboratory studies on MyChart before your provider has had a chance to review them.  We understand that in some cases there may be results that are confusing or concerning to you. Not all laboratory results come back in the same time frame and the provider may be waiting for multiple results in order to interpret others.  Please give us 48 hours in order for your provider to thoroughly review all the results before contacting the office for clarification of your results.   +++++++++++++++++++++++++  Vit D  & Vit C 1,000 mg   are recommended to help protect  against the Covid-19 and other Corona viruses.    Also it's recommended  to take  Zinc 50 mg  to help  protect against the Covid-19   and best place to get  is also on [employer]  and don't pay more than 6-8 cents /pill !  ================================ Coronavirus (COVID-19) Are you at risk?  Are you at risk for the Coronavirus (COVID-19)?  To be considered HIGH RISK for Coronavirus (COVID-19), you have to meet the following criteria:  Traveled to [place], [place], [place], [place] or [place]; or in the [place] to [place], [place], [place]  or [place]; and have fever, cough, and shortness of breath within the last 2 weeks of travel OR Been in close contact with a person diagnosed with COVID-19 within the last 2 weeks and have  fever, cough,and shortness of breath  IF YOU DO NOT MEET THESE CRITERIA, YOU ARE CONSIDERED LOW RISK FOR COVID-19.  What to do if you are HIGH RISK for COVID-19?  If you are having a medical emergency, call 911. Seek medical care right away. Before you go to a doctor's office, urgent care or emergency department,  call ahead and tell them about your recent travel, contact with someone diagnosed with COVID-19   and your symptoms.  You should receive instructions from your physician's office regarding next  steps of care.  When you arrive at healthcare provider, tell the healthcare staff immediately you have returned from  visiting [place], [place], [place], [place] or [place]; or traveled in the [place] to [place], [place],  [place] or [place] in the last two weeks or you have been in close contact with a person diagnosed with  COVID-19 in the last 2 weeks.   Tell the health care staff about your symptoms: fever, cough and shortness of breath. After you have been seen by a medical provider, you will be either: Tested for (COVID-19) and discharged home on quarantine except to seek medical care if  symptoms worsen, and asked to  Stay home and avoid contact with others until you get your results (4-5 days)  Avoid travel on public transportation if possible (such as bus, train, or airplane) or Sent to the Emergency Department by EMS for evaluation, COVID-19 testing  and  possible admission depending on your condition and test results.  What to do if you are LOW RISK for COVID-19?  Reduce your risk of any infection by using the same precautions used for avoiding the common cold or flu:  Wash your hands often with soap and warm water for at least 20 seconds.  If soap and water are not readily available,  use an alcohol-based hand sanitizer with at least 60% alcohol.  If coughing or sneezing, cover your mouth and nose by coughing   or sneezing into the elbow areas of your shirt or coat,  into a tissue or into your sleeve (not your hands). Avoid shaking hands with others and consider head nods or verbal greetings only. Avoid touching your eyes, nose, or mouth with unwashed hands.  Avoid close contact with people who are sick. Avoid places or events with large numbers of people in one location, like concerts or sporting events. Carefully consider travel plans you have or are making. If you are planning any travel outside or inside the [place], visit the CDC's Travelers' Health webpage for the  latest health notices. If you have some symptoms but not all symptoms, continue to monitor at home and seek medical attention  if your symptoms worsen. If you are having a medical emergency, call 911.   >>>>>>>>>>>>>>>>>>>>>>>>>>>>>>>>>  We Do NOT Approve of [employer] > > > > > > > > > > > > > > > > > > > > > > > > > > > > > > > > > > > > > > >  Preventive Care for Adults  A healthy lifestyle and preventive care can promote health and wellness. Preventive health guidelines for women include the following key practices. A routine yearly physical is a good way to check with your health care provider about your health and preventive screening. It is a chance to share any concerns and updates on your health and to receive a thorough exam. Visit your dentist for a routine exam and preventive care every 6 months. Brush your teeth twice a day and floss once a day. Good oral hygiene prevents tooth decay and gum disease. The frequency of eye exams is based on your age, health, family medical history, use of contact lenses, and other factors. Follow your health care provider's recommendations for frequency of eye exams. Eat a healthy diet. Foods like vegetables, fruits, whole grains, low-fat dairy products, and lean protein foods contain the nutrients you need without too many calories. Decrease your intake of foods high in solid fats, added sugars, and salt. Eat the right amount of calories for you. Get information about a proper diet from your health care provider, if necessary. Regular physical exercise is one of the most important things you can do for your health. Most adults should get at least 150 minutes of moderate-intensity exercise (any activity that increases your heart rate and causes you to sweat) each week. In addition, most adults need muscle-strengthening exercises on 2 or more days a week. Maintain a healthy weight. The body mass index (BMI) is a screening tool to identify  possible weight problems. It provides an estimate of body fat based on height and weight. Your health care provider can find [name] and can help you achieve or maintain a healthy weight. For adults 20 years and older: A BMI below 18.5 is considered underweight. A BMI of 18.5 to 24.9 is normal. A BMI of 25 to 29.9 is considered overweight. A BMI of 30 and above is considered obese. Maintain normal blood lipids and cholesterol levels by exercising and minimizing your intake of saturated fat. Eat a balanced diet with plenty of fruit and vegetables. If your lipid or cholesterol levels are high, you are over 50, or you are at high risk for heart disease, you may need your cholesterol levels checked more frequently. Ongoing high lipid and cholesterol levels should be treated with medicines if diet and exercise are not working. If you smoke, find out from   your health care provider how to quit. If you do not use tobacco, do not start. Lung cancer screening is recommended for adults aged 55-80 years who are at high risk for developing lung cancer because of a history of smoking. A yearly low-dose CT scan of the lungs is recommended for people who have at least a 30-pack-year history of smoking and are a current smoker or have quit within the past 15 years. A pack year of smoking is smoking an average of 1 pack of cigarettes a day for 1 year (for example: 1 pack a day for 30 years or 2 packs a day for 15 years). Yearly screening should continue until the smoker has stopped smoking for at least 15 years. Yearly screening should be stopped for people who develop a health problem that would prevent them from having lung cancer treatment. Avoid use of street drugs. Do not share needles with anyone. Ask for help if you need support or instructions about stopping the use of drugs. High blood pressure causes heart disease and increases the risk of stroke.  Ongoing high blood pressure should be treated with medicines if  weight loss and exercise do not work. If you are 55-79 years old, ask your health care provider if you should take aspirin to prevent strokes. Diabetes screening involves taking a blood sample to check your fasting blood sugar level. This should be done once every 3 years, after age 45, if you are within normal weight and without risk factors for diabetes. Testing should be considered at a younger age or be carried out more frequently if you are overweight and have at least 1 risk factor for diabetes. Breast cancer screening is essential preventive care for women. You should practice "breast self-awareness." This means understanding the normal appearance and feel of your breasts and may include breast self-examination. Any changes detected, no matter how small, should be reported to a health care provider. Women in their 20s and 30s should have a clinical breast exam (CBE) by a health care provider as part of a regular health exam every 1 to 3 years. After age 40, women should have a CBE every year. Starting at age 40, women should consider having a mammogram (breast X-ray test) every year. Women who have a family history of breast cancer should talk to their health care provider about genetic screening. Women at a high risk of breast cancer should talk to their health care providers about having an MRI and a mammogram every year. Breast cancer gene (BRCA)-related cancer risk assessment is recommended for women who have family members with BRCA-related cancers. BRCA-related cancers include breast, ovarian, tubal, and peritoneal cancers. Having family members with these cancers may be associated with an increased risk for harmful changes (mutations) in the breast cancer genes BRCA1 and BRCA2. Results of the assessment will determine the need for genetic counseling and BRCA1 and BRCA2 testing. Routine pelvic exams to screen for cancer are no longer recommended for nonpregnant women who are considered low risk for  cancer of the pelvic organs (ovaries, uterus, and vagina) and who do not have symptoms. Ask your health care provider if a screening pelvic exam is right for you. If you have had past treatment for cervical cancer or a condition that could lead to cancer, you need Pap tests and screening for cancer for at least 20 years after your treatment. If Pap tests have been discontinued, your risk factors (such as having a new sexual partner) need to be   reassessed to determine if screening should be resumed. Some women have medical problems that increase the chance of getting cervical cancer. In these cases, your health care provider may recommend more frequent screening and Pap tests.  Colorectal cancer can be detected and often prevented. Most routine colorectal cancer screening begins at the age of 9 years and continues through age 47 years. However, your health care provider may recommend screening at an earlier age if you have risk factors for colon cancer. On a yearly basis, your health care provider may provide home test kits to check for hidden blood in the stool. Use of a small camera at the end of a tube, to directly examine the colon (sigmoidoscopy or colonoscopy), can detect the earliest forms of colorectal cancer. Talk to your health care provider about this at age 66, when routine screening begins.  Direct exam of the colon should be repeated every 5-10 years through age 98 years, unless early forms of pre-cancerous polyps or small growths are found. Osteoporosis is a disease in which the bones lose minerals and strength with aging. This can result in serious bone fractures or breaks. The risk of osteoporosis can be identified using a bone density scan. Women ages 48 years and over and women at risk for fractures or osteoporosis should discuss screening with their health care providers. Ask your health care provider whether you should take a calcium supplement or vitamin D to reduce the rate of  osteoporosis. Menopause can be associated with physical symptoms and risks. Hormone replacement therapy is available to decrease symptoms and risks. You should talk to your health care provider about whether hormone replacement therapy is right for you. Use sunscreen. Apply sunscreen liberally and repeatedly throughout the day. You should seek shade when your shadow is shorter than you. Protect yourself by wearing long sleeves, pants, a wide-brimmed hat, and sunglasses year round, whenever you are outdoors. Once a month, do a whole body skin exam, using a mirror to look at the skin on your back. Tell your health care provider of new moles, moles that have irregular borders, moles that are larger than a pencil eraser, or moles that have changed in shape or color. Stay current with required vaccines (immunizations). Influenza vaccine. All adults should be immunized every year. Tetanus, diphtheria, and acellular pertussis (Td, Tdap) vaccine. Pregnant women should receive 1 dose of Tdap vaccine during each pregnancy. The dose should be obtained regardless of the length of time since the last dose. Immunization is preferred during the 27th-36th week of gestation. An adult who has not previously received Tdap or who does not know her vaccine status should receive 1 dose of Tdap. This initial dose should be followed by tetanus and diphtheria toxoids (Td) booster doses every 10 years. Adults with an unknown or incomplete history of completing a 3-dose immunization series with Td-containing vaccines should begin or complete a primary immunization series including a Tdap dose. Adults should receive a Td booster every 10 years.  Zoster vaccine. One dose is recommended for adults aged 47 years or older unless certain conditions are present.  Pneumococcal 13-valent conjugate (PCV13) vaccine. When indicated, a person who is uncertain of her immunization history and has no record of immunization should receive the PCV13  vaccine. An adult aged 63 years or older who has certain medical conditions and has not been previously immunized should receive 1 dose of PCV13 vaccine. This PCV13 should be followed with a dose of pneumococcal polysaccharide (PPSV23) vaccine. The PPSV23  vaccine dose should be obtained at least 1 or more year(s) after the dose of PCV13 vaccine. An adult aged 19 years or older who has certain medical conditions and previously received 1 or more doses of PPSV23 vaccine should receive 1 dose of PCV13. The PCV13 vaccine dose should be obtained 1 or more years after the last PPSV23 vaccine dose.  Pneumococcal polysaccharide (PPSV23) vaccine. When PCV13 is also indicated, PCV13 should be obtained first. All adults aged 65 years and older should be immunized. An adult younger than age 65 years who has certain medical conditions should be immunized. Any person who resides in a nursing home or long-term care facility should be immunized. An adult smoker should be immunized. People with an immunocompromised condition and certain other conditions should receive both PCV13 and PPSV23 vaccines. People with human immunodeficiency virus (HIV) infection should be immunized as soon as possible after diagnosis. Immunization during chemotherapy or radiation therapy should be avoided. Routine use of PPSV23 vaccine is not recommended for American Indians, [place] Natives, or people younger than 65 years unless there are medical conditions that require PPSV23 vaccine. When indicated, people who have unknown immunization and have no record of immunization should receive PPSV23 vaccine. One-time revaccination 5 years after the first dose of PPSV23 is recommended for people aged 19-64 years who have chronic kidney failure, nephrotic syndrome, asplenia, or immunocompromised conditions. People who received 1-2 doses of PPSV23 before age 65 years should receive another dose of PPSV23 vaccine at age 65 years or later if at least 5 years have  passed since the previous dose. Doses of PPSV23 are not needed for people immunized with PPSV23 at or after age 65 years.  Preventive Services / Frequency  Ages 65 years and over Blood pressure check. Lipid and cholesterol check. Lung cancer screening. / Every year if you are aged 55-80 years and have a 30-pack-year history of smoking and currently smoke or have quit within the past 15 years. Yearly screening is stopped once you have quit smoking for at least 15 years or develop a health problem that would prevent you from having lung cancer treatment. Clinical breast exam.** / Every year after age 40 years.  BRCA-related cancer risk assessment.** / For women who have family members with a BRCA-related cancer (breast, ovarian, tubal, or peritoneal cancers). Mammogram.** / Every year beginning at age 40 years and continuing for as long as you are in good health. Consult with your health care provider. Pap test.** / Every 3 years starting at age 30 years through age 65 or 70 years with 3 consecutive normal Pap tests. Testing can be stopped between 65 and 70 years with 3 consecutive normal Pap tests and no abnormal Pap or HPV tests in the past 10 years. Fecal occult blood test (FOBT) of stool. / Every year beginning at age 50 years and continuing until age 75 years. You may not need to do this test if you get a colonoscopy every 10 years. Flexible sigmoidoscopy or colonoscopy.** / Every 5 years for a flexible sigmoidoscopy or every 10 years for a colonoscopy beginning at age 50 years and continuing until age 75 years. Hepatitis C blood test.** / For all people born from 1945 through 1965 and any individual with known risks for hepatitis C. Osteoporosis screening.** / A one-time screening for women ages 65 years and over and women at risk for fractures or osteoporosis. Skin self-exam. / Monthly. Influenza vaccine. / Every year. Tetanus, diphtheria, and acellular pertussis (Tdap/Td) vaccine.** /   1 dose  of Td every 10 years. Zoster vaccine.** / 1 dose for adults aged 60 years or older. Pneumococcal 13-valent conjugate (PCV13) vaccine.** / Consult your health care provider. Pneumococcal polysaccharide (PPSV23) vaccine.** / 1 dose for all adults aged 65 years and older. Screening for abdominal aortic aneurysm (AAA)  by ultrasound is recommended for people who have history of high blood pressure or who are current or former smokers. ++++++++++++++++++++ Recommend Adult Low Dose Aspirin or  coated  Aspirin 81 mg daily  To reduce risk of Colon Cancer 40 %,  Skin Cancer 26 % ,  Melanoma 46%  and  Pancreatic cancer 60% ++++++++++++++++++++ Vitamin D goal  is between 70-100.  Please make sure that you are taking your Vitamin D as directed.  It is very important as a natural anti-inflammatory  helping hair, skin, and nails, as well as reducing stroke and heart attack risk.  It helps your bones and helps with mood. It also decreases numerous cancer risks so please take it as directed.  Low Vit D is associated with a 200-300% higher risk for CANCER  and 200-300% higher risk for HEART   ATTACK  &  STROKE.   ...................................... It is also associated with higher death rate at younger ages,  autoimmune diseases like Rheumatoid arthritis, Lupus, Multiple Sclerosis.    Also many other serious conditions, like depression, Alzheimer's Dementia, infertility, muscle aches, fatigue, fibromyalgia - just to name a few. ++++++++++++++++++ Recommend the book "The END of DIETING" by Dr Joel Fuhrman  & the book "The END of DIABETES " by Dr Joel Fuhrman At [employer] - get book & Audio CD's    Being diabetic has a  300% increased risk for heart attack, stroke, cancer, and alzheimer- type vascular dementia. It is very important that you work harder with diet by avoiding all foods that are white. Avoid white rice (brown & wild rice is OK), white potatoes (sweetpotatoes in moderation is OK),  White bread or wheat bread or anything made out of white flour like bagels, donuts, rolls, buns, biscuits, cakes, pastries, cookies, pizza crust, and pasta (made from white flour & egg whites) - vegetarian pasta or spinach or wheat pasta is OK. Multigrain breads like Arnold's or Pepper[employer], or multigrain sandwich thins or flatbreads.  Diet, exercise and weight loss can reverse and cure diabetes in the early stages.  Diet, exercise and weight loss is very important in the control and prevention of complications of diabetes which affects every system in your body, ie. Brain - dementia/stroke, eyes - glaucoma/blindness, heart - heart attack/heart failure, kidneys - dialysis, stomach - gastric paralysis, intestines - malabsorption, nerves - severe painful neuritis, circulation - gangrene & loss of a leg(s), and finally cancer and Alzheimers.    I recommend avoid fried & greasy foods,  sweets/candy, white rice (brown or wild rice or Quinoa is OK), white potatoes (sweet potatoes are OK) - anything made from white flour - bagels, doughnuts, rolls, buns, biscuits,white and wheat breads, pizza crust and traditional pasta made of white flour & egg white(vegetarian pasta or spinach or wheat pasta is OK).  Multi-grain bread is OK - like multi-grain flat bread or sandwich thins. Avoid alcohol in excess. Exercise is also important.    Eat all the vegetables you want - avoid meat, especially red meat and dairy - especially cheese.  Cheese is the most concentrated form of trans-fats which is the worst thing to clog up our arteries. Veggie cheese is OK   which can be found in the fresh produce section at [employer] or [employer] or [employer]  +++++++++++++++++++ DASH Eating Plan  DASH stands for "Dietary Approaches to Stop Hypertension."   The DASH eating plan is a healthy eating plan that has been shown to reduce high blood pressure (hypertension). Additional health benefits may include reducing the risk of type 2  diabetes mellitus, heart disease, and stroke. The DASH eating plan may also help with weight loss. WHAT DO I NEED TO KNOW ABOUT THE DASH EATING PLAN? For the DASH eating plan, you will follow these general guidelines: Choose foods with a percent daily value for sodium of less than 5% (as listed on the food label). Use salt-free seasonings or herbs instead of table salt or sea salt. Check with your health care provider or pharmacist before using salt substitutes. Eat lower-sodium products, often labeled as "lower sodium" or "no salt added." Eat fresh foods. Eat more vegetables, fruits, and low-fat dairy products. Choose whole grains. Look for the word "whole" as the first word in the ingredient list. Choose fish  Limit sweets, desserts, sugars, and sugary drinks. Choose heart-healthy fats. Eat veggie cheese  Eat more home-cooked food and less restaurant, buffet, and fast food. Limit fried foods. Cook foods using methods other than frying. Limit canned vegetables. If you do use them, rinse them well to decrease the sodium. When eating at a restaurant, ask that your food be prepared with less salt, or no salt if possible.                      WHAT FOODS CAN I EAT? Read Dr Joel Fuhrman's books on The End of Dieting & The End of Diabetes  Grains Whole grain or whole wheat bread. Brown rice. Whole grain or whole wheat pasta. Quinoa, bulgur, and whole grain cereals. Low-sodium cereals. Corn or whole wheat flour tortillas. Whole grain cornbread. Whole grain crackers. Low-sodium crackers.  Vegetables Fresh or frozen vegetables (raw, steamed, roasted, or grilled). Low-sodium or reduced-sodium tomato and vegetable juices. Low-sodium or reduced-sodium tomato sauce and paste. Low-sodium or reduced-sodium canned vegetables.   Fruits All fresh, canned (in natural juice), or frozen fruits.  Protein Products  All fish and seafood.  Dried beans, peas, or lentils. Unsalted nuts and seeds. Unsalted  canned beans.  Dairy Low-fat dairy products, such as skim or 1% milk, 2% or reduced-fat cheeses, low-fat ricotta or cottage cheese, or plain low-fat yogurt. Low-sodium or reduced-sodium cheeses.  Fats and Oils Tub margarines without trans fats. Light or reduced-fat mayonnaise and salad dressings (reduced sodium). Avocado. Safflower, olive, or canola oils. Natural peanut or almond butter.  Other Unsalted popcorn and pretzels. The items listed above may not be a complete list of recommended foods or beverages. Contact your dietitian for more options.  +++++++++++++++  WHAT FOODS ARE NOT RECOMMENDED? Grains/ White flour or wheat flour White bread. White pasta. White rice. Refined cornbread. Bagels and croissants. Crackers that contain trans fat.  Vegetables  Creamed or fried vegetables. Vegetables in a . Regular canned vegetables. Regular canned tomato sauce and paste. Regular tomato and vegetable juices.  Fruits Dried fruits. Canned fruit in light or heavy syrup. Fruit juice.  Meat and Other Protein Products Meat in general - RED meat & White meat.  Fatty cuts of meat. Ribs, chicken wings, all processed meats as bacon, sausage, bologna, salami, fatback, hot dogs, bratwurst and packaged luncheon meats.  Dairy Whole or 2% milk, cream, half-and-half, and cream cheese.   Whole-fat or sweetened yogurt. Full-fat cheeses or blue cheese. Non-dairy creamers and whipped toppings. Processed cheese, cheese spreads, or cheese curds.  Condiments Onion and garlic salt, seasoned salt, table salt, and sea salt. Canned and packaged gravies. Worcestershire sauce. Tartar sauce. Barbecue sauce. Teriyaki sauce. Soy sauce, including reduced sodium. Steak sauce. Fish sauce. Oyster sauce. Cocktail sauce. Horseradish. Ketchup and mustard. Meat flavorings and tenderizers. Bouillon cubes. Hot sauce. Tabasco sauce. Marinades. Taco seasonings. Relishes.  Fats and Oils Butter, stick margarine, lard, shortening and  bacon fat. Coconut, palm kernel, or palm oils. Regular salad dressings.  Pickles and olives. Salted popcorn and pretzels.  The items listed above may not be a complete list of foods and beverages to avoid.   

## 2023-06-27 ENCOUNTER — Encounter: Payer: Self-pay | Admitting: Internal Medicine

## 2023-06-27 ENCOUNTER — Ambulatory Visit (INDEPENDENT_AMBULATORY_CARE_PROVIDER_SITE_OTHER): Payer: Medicare HMO | Admitting: Internal Medicine

## 2023-06-27 VITALS — BP 125/62 | HR 65 | Temp 97.9°F | Resp 16 | Ht 62.25 in | Wt 115.2 lb

## 2023-06-27 DIAGNOSIS — E039 Hypothyroidism, unspecified: Secondary | ICD-10-CM | POA: Diagnosis not present

## 2023-06-27 DIAGNOSIS — E782 Mixed hyperlipidemia: Secondary | ICD-10-CM

## 2023-06-27 DIAGNOSIS — I700 Atherosclerosis of aorta: Secondary | ICD-10-CM

## 2023-06-27 DIAGNOSIS — Z136 Encounter for screening for cardiovascular disorders: Secondary | ICD-10-CM

## 2023-06-27 DIAGNOSIS — E559 Vitamin D deficiency, unspecified: Secondary | ICD-10-CM | POA: Diagnosis not present

## 2023-06-27 DIAGNOSIS — Z79899 Other long term (current) drug therapy: Secondary | ICD-10-CM

## 2023-06-27 DIAGNOSIS — Z0000 Encounter for general adult medical examination without abnormal findings: Secondary | ICD-10-CM | POA: Diagnosis not present

## 2023-06-27 DIAGNOSIS — Z1211 Encounter for screening for malignant neoplasm of colon: Secondary | ICD-10-CM

## 2023-06-27 DIAGNOSIS — R7309 Other abnormal glucose: Secondary | ICD-10-CM | POA: Diagnosis not present

## 2023-06-27 DIAGNOSIS — I10 Essential (primary) hypertension: Secondary | ICD-10-CM

## 2023-06-27 DIAGNOSIS — R6889 Other general symptoms and signs: Secondary | ICD-10-CM | POA: Diagnosis not present

## 2023-06-27 DIAGNOSIS — G250 Essential tremor: Secondary | ICD-10-CM

## 2023-06-27 DIAGNOSIS — Z0001 Encounter for general adult medical examination with abnormal findings: Secondary | ICD-10-CM

## 2023-06-27 DIAGNOSIS — Z87891 Personal history of nicotine dependence: Secondary | ICD-10-CM

## 2023-06-27 DIAGNOSIS — Z8249 Family history of ischemic heart disease and other diseases of the circulatory system: Secondary | ICD-10-CM

## 2023-06-27 MED ORDER — ONDANSETRON HCL 8 MG PO TABS: ORAL_TABLET | ORAL | 3 refills | Status: AC

## 2023-06-28 DIAGNOSIS — L570 Actinic keratosis: Secondary | ICD-10-CM | POA: Diagnosis not present

## 2023-06-28 DIAGNOSIS — L578 Other skin changes due to chronic exposure to nonionizing radiation: Secondary | ICD-10-CM | POA: Diagnosis not present

## 2023-06-28 DIAGNOSIS — D0439 Carcinoma in situ of skin of other parts of face: Secondary | ICD-10-CM | POA: Diagnosis not present

## 2023-06-28 NOTE — Progress Notes (Signed)
^<^<^<^<^<^<^<^<^<^<^<^<^<^<^<^<^<^<^<^<^<^<^<^<^<^<^<^<^<^<^<^<^<^<^<^<^ ^>^>^>^>^>^>^>^>^>^>^>>^>^>^>^>^>^>^>^>^>^>^>^>^>^>^>^>^>^>^>^>^>^>^>^>^>  - Thyroid test is a little out of range & this frequently happens if                                                                        someone is taking high doses of "Biotin"    - usual amount of Biotin iin a Multivitamin is about 30 mcg ( & is OK) , but  Unfortunately it's frequently sold OTC alone or in Skin, Hair, Nail products in    amounts of 1,000 mcg - 2,500 mcg to 5,000 mcg &I've even seen it at 10,000 mcg                                                                                                                     ( 10 mg )   - The problem being is that Biotin causes the lab analyzer machines   that measure the thyroid hormone (&other blood measurements) to   yield incorrect results &if the Medical provider is unaware of this,   then incorrect changes in medicines ( thyroid &other ) are recommended based on   Incorrect lab  results &                               there have been reports of Death(s) due to patients taking Biotin.   But since it's not a regulated substance, the FDA does nothing !  - Taking Biotin will make it look like the Thyroid is too low in the blood &then   If the Doctor prescribes thyroid, then there would be too much in the blood &  could cause a heart arrhythmia or                                               severe hypertension leading to a stroke or heart attack  - So please stop any supplements with Biotin   ^<^<^<^<^<^<^<^<^<^<^<^<^<^<^<^<^<^<^<^<^<^<^<^<^<^<^<^<^<^<^<^<^<^<^<^<^ ^>^>^>^>^>^>^>^>^>^>^>^>^>^>^>^>^>^>^>^>^>^>^>^>^>^>^>^>^>^>^>^>^>^>^>^>^  -  Chol = 158  -  Excellent   - Very low risk for Heart Attack  /  Stroke  ^>^>^>^>^>^>^>^>^>^>^>^>^>^>^>^>^>^>^>^>^>^>^>^>^>^>^>^>^>^>^>^>^>^>^>^>^ ^>^>^>^>^>^>^>^>^>^>^>^>^>^>^>^>^>^>^>^>^>^>^>^>^>^>^>^>^>^>^>^>^>^>^>^>^  -  A1c - Normal - no Diabetes   ^<^<^<^<^<^<^<^<^<^<^<^<^<^<^<^<^<^<^<^<^<^<^<^<^<^<^<^<^<^<^<^<^<^<^<^<^ ^>^>^>^>^>^>^>^>^>^>^>^>^>^>^>^>^>^>^>^>^>^>^>^>^>^>^>^>^>^>^>^>^>^>^>^>^  -  Vitamin D = 63 - Excellent - Please keep dosage same   ^<^<^<^<^<^<^<^<^<^<^<^<^<^<^<^<^<^<^<^<^<^<^<^<^<^<^<^<^<^<^<^<^<^<^<^<^ ^>^>^>^>^>^>^>^>^>^>^>^>^>^>^>^>^>^>^>^>^>^>^>^>^>^>^>^>^>^>^>^>^>^>^>^>^  -  All Else - CBC - Kidneys - Electrolytes - Liver &  Magnesium   - all  Normal / OK  ^<^<^<^<^<^<^<^<^<^<^<^<^<^<^<^<^<^<^<^<^<^<^<^<^<^<^<^<^<^<^<^<^<^<^<^<^ ^>^>^>^>^>^>^>^>^>^>^>^>^>^>^>^>^>^>^>^>^>^>^>^>^>^>^>^>^>^>^>^>^>^>^>^>^

## 2023-06-30 DIAGNOSIS — I10 Essential (primary) hypertension: Secondary | ICD-10-CM | POA: Diagnosis not present

## 2023-06-30 DIAGNOSIS — D649 Anemia, unspecified: Secondary | ICD-10-CM | POA: Diagnosis not present

## 2023-06-30 DIAGNOSIS — S72462D Displaced supracondylar fracture with intracondylar extension of lower end of left femur, subsequent encounter for closed fracture with routine healing: Secondary | ICD-10-CM | POA: Diagnosis not present

## 2023-06-30 DIAGNOSIS — I700 Atherosclerosis of aorta: Secondary | ICD-10-CM | POA: Diagnosis not present

## 2023-06-30 DIAGNOSIS — E538 Deficiency of other specified B group vitamins: Secondary | ICD-10-CM | POA: Diagnosis not present

## 2023-06-30 DIAGNOSIS — F1320 Sedative, hypnotic or anxiolytic dependence, uncomplicated: Secondary | ICD-10-CM | POA: Diagnosis not present

## 2023-06-30 DIAGNOSIS — M069 Rheumatoid arthritis, unspecified: Secondary | ICD-10-CM | POA: Diagnosis not present

## 2023-06-30 DIAGNOSIS — K5790 Diverticulosis of intestine, part unspecified, without perforation or abscess without bleeding: Secondary | ICD-10-CM | POA: Diagnosis not present

## 2023-06-30 DIAGNOSIS — S82252D Displaced comminuted fracture of shaft of left tibia, subsequent encounter for closed fracture with routine healing: Secondary | ICD-10-CM | POA: Diagnosis not present

## 2023-07-04 ENCOUNTER — Other Ambulatory Visit: Payer: Self-pay | Admitting: Internal Medicine

## 2023-07-04 DIAGNOSIS — B351 Tinea unguium: Secondary | ICD-10-CM

## 2023-07-04 MED ORDER — TERBINAFINE HCL 250 MG PO TABS: ORAL_TABLET | ORAL | 0 refills | Status: DC

## 2023-07-05 DIAGNOSIS — M069 Rheumatoid arthritis, unspecified: Secondary | ICD-10-CM | POA: Diagnosis not present

## 2023-07-05 DIAGNOSIS — S82252D Displaced comminuted fracture of shaft of left tibia, subsequent encounter for closed fracture with routine healing: Secondary | ICD-10-CM | POA: Diagnosis not present

## 2023-07-05 DIAGNOSIS — K5790 Diverticulosis of intestine, part unspecified, without perforation or abscess without bleeding: Secondary | ICD-10-CM | POA: Diagnosis not present

## 2023-07-05 DIAGNOSIS — D649 Anemia, unspecified: Secondary | ICD-10-CM | POA: Diagnosis not present

## 2023-07-05 DIAGNOSIS — I10 Essential (primary) hypertension: Secondary | ICD-10-CM | POA: Diagnosis not present

## 2023-07-05 DIAGNOSIS — I700 Atherosclerosis of aorta: Secondary | ICD-10-CM | POA: Diagnosis not present

## 2023-07-05 DIAGNOSIS — F1320 Sedative, hypnotic or anxiolytic dependence, uncomplicated: Secondary | ICD-10-CM | POA: Diagnosis not present

## 2023-07-05 DIAGNOSIS — S72462D Displaced supracondylar fracture with intracondylar extension of lower end of left femur, subsequent encounter for closed fracture with routine healing: Secondary | ICD-10-CM | POA: Diagnosis not present

## 2023-07-05 DIAGNOSIS — E538 Deficiency of other specified B group vitamins: Secondary | ICD-10-CM | POA: Diagnosis not present

## 2023-07-07 DIAGNOSIS — K5790 Diverticulosis of intestine, part unspecified, without perforation or abscess without bleeding: Secondary | ICD-10-CM | POA: Diagnosis not present

## 2023-07-07 DIAGNOSIS — M069 Rheumatoid arthritis, unspecified: Secondary | ICD-10-CM | POA: Diagnosis not present

## 2023-07-07 DIAGNOSIS — S82252D Displaced comminuted fracture of shaft of left tibia, subsequent encounter for closed fracture with routine healing: Secondary | ICD-10-CM | POA: Diagnosis not present

## 2023-07-07 DIAGNOSIS — F1320 Sedative, hypnotic or anxiolytic dependence, uncomplicated: Secondary | ICD-10-CM | POA: Diagnosis not present

## 2023-07-07 DIAGNOSIS — I10 Essential (primary) hypertension: Secondary | ICD-10-CM | POA: Diagnosis not present

## 2023-07-07 DIAGNOSIS — E538 Deficiency of other specified B group vitamins: Secondary | ICD-10-CM | POA: Diagnosis not present

## 2023-07-07 DIAGNOSIS — S72462D Displaced supracondylar fracture with intracondylar extension of lower end of left femur, subsequent encounter for closed fracture with routine healing: Secondary | ICD-10-CM | POA: Diagnosis not present

## 2023-07-07 DIAGNOSIS — D649 Anemia, unspecified: Secondary | ICD-10-CM | POA: Diagnosis not present

## 2023-07-07 DIAGNOSIS — I700 Atherosclerosis of aorta: Secondary | ICD-10-CM | POA: Diagnosis not present

## 2023-07-10 DIAGNOSIS — M1712 Unilateral primary osteoarthritis, left knee: Secondary | ICD-10-CM | POA: Diagnosis not present

## 2023-07-10 DIAGNOSIS — R6889 Other general symptoms and signs: Secondary | ICD-10-CM | POA: Diagnosis not present

## 2023-07-11 DIAGNOSIS — M069 Rheumatoid arthritis, unspecified: Secondary | ICD-10-CM | POA: Diagnosis not present

## 2023-07-11 DIAGNOSIS — S82252D Displaced comminuted fracture of shaft of left tibia, subsequent encounter for closed fracture with routine healing: Secondary | ICD-10-CM | POA: Diagnosis not present

## 2023-07-11 DIAGNOSIS — I10 Essential (primary) hypertension: Secondary | ICD-10-CM | POA: Diagnosis not present

## 2023-07-11 DIAGNOSIS — K5790 Diverticulosis of intestine, part unspecified, without perforation or abscess without bleeding: Secondary | ICD-10-CM | POA: Diagnosis not present

## 2023-07-11 DIAGNOSIS — F1320 Sedative, hypnotic or anxiolytic dependence, uncomplicated: Secondary | ICD-10-CM | POA: Diagnosis not present

## 2023-07-11 DIAGNOSIS — D649 Anemia, unspecified: Secondary | ICD-10-CM | POA: Diagnosis not present

## 2023-07-11 DIAGNOSIS — S72462D Displaced supracondylar fracture with intracondylar extension of lower end of left femur, subsequent encounter for closed fracture with routine healing: Secondary | ICD-10-CM | POA: Diagnosis not present

## 2023-07-11 DIAGNOSIS — E538 Deficiency of other specified B group vitamins: Secondary | ICD-10-CM | POA: Diagnosis not present

## 2023-07-11 DIAGNOSIS — I700 Atherosclerosis of aorta: Secondary | ICD-10-CM | POA: Diagnosis not present

## 2023-07-13 ENCOUNTER — Ambulatory Visit: Payer: Medicare HMO | Admitting: Nurse Practitioner

## 2023-07-14 DIAGNOSIS — E538 Deficiency of other specified B group vitamins: Secondary | ICD-10-CM | POA: Diagnosis not present

## 2023-07-14 DIAGNOSIS — I700 Atherosclerosis of aorta: Secondary | ICD-10-CM | POA: Diagnosis not present

## 2023-07-14 DIAGNOSIS — S82252D Displaced comminuted fracture of shaft of left tibia, subsequent encounter for closed fracture with routine healing: Secondary | ICD-10-CM | POA: Diagnosis not present

## 2023-07-14 DIAGNOSIS — S72462D Displaced supracondylar fracture with intracondylar extension of lower end of left femur, subsequent encounter for closed fracture with routine healing: Secondary | ICD-10-CM | POA: Diagnosis not present

## 2023-07-14 DIAGNOSIS — F1320 Sedative, hypnotic or anxiolytic dependence, uncomplicated: Secondary | ICD-10-CM | POA: Diagnosis not present

## 2023-07-14 DIAGNOSIS — D649 Anemia, unspecified: Secondary | ICD-10-CM | POA: Diagnosis not present

## 2023-07-14 DIAGNOSIS — M069 Rheumatoid arthritis, unspecified: Secondary | ICD-10-CM | POA: Diagnosis not present

## 2023-07-14 DIAGNOSIS — K5790 Diverticulosis of intestine, part unspecified, without perforation or abscess without bleeding: Secondary | ICD-10-CM | POA: Diagnosis not present

## 2023-07-14 DIAGNOSIS — I10 Essential (primary) hypertension: Secondary | ICD-10-CM | POA: Diagnosis not present

## 2023-07-15 DIAGNOSIS — F1320 Sedative, hypnotic or anxiolytic dependence, uncomplicated: Secondary | ICD-10-CM | POA: Diagnosis not present

## 2023-07-15 DIAGNOSIS — I700 Atherosclerosis of aorta: Secondary | ICD-10-CM | POA: Diagnosis not present

## 2023-07-15 DIAGNOSIS — E538 Deficiency of other specified B group vitamins: Secondary | ICD-10-CM | POA: Diagnosis not present

## 2023-07-15 DIAGNOSIS — S72462D Displaced supracondylar fracture with intracondylar extension of lower end of left femur, subsequent encounter for closed fracture with routine healing: Secondary | ICD-10-CM | POA: Diagnosis not present

## 2023-07-15 DIAGNOSIS — D649 Anemia, unspecified: Secondary | ICD-10-CM | POA: Diagnosis not present

## 2023-07-15 DIAGNOSIS — K5790 Diverticulosis of intestine, part unspecified, without perforation or abscess without bleeding: Secondary | ICD-10-CM | POA: Diagnosis not present

## 2023-07-15 DIAGNOSIS — I10 Essential (primary) hypertension: Secondary | ICD-10-CM | POA: Diagnosis not present

## 2023-07-15 DIAGNOSIS — M069 Rheumatoid arthritis, unspecified: Secondary | ICD-10-CM | POA: Diagnosis not present

## 2023-07-15 DIAGNOSIS — S82252D Displaced comminuted fracture of shaft of left tibia, subsequent encounter for closed fracture with routine healing: Secondary | ICD-10-CM | POA: Diagnosis not present

## 2023-07-18 DIAGNOSIS — I700 Atherosclerosis of aorta: Secondary | ICD-10-CM | POA: Diagnosis not present

## 2023-07-18 DIAGNOSIS — K5790 Diverticulosis of intestine, part unspecified, without perforation or abscess without bleeding: Secondary | ICD-10-CM | POA: Diagnosis not present

## 2023-07-18 DIAGNOSIS — I10 Essential (primary) hypertension: Secondary | ICD-10-CM | POA: Diagnosis not present

## 2023-07-18 DIAGNOSIS — D649 Anemia, unspecified: Secondary | ICD-10-CM | POA: Diagnosis not present

## 2023-07-18 DIAGNOSIS — E538 Deficiency of other specified B group vitamins: Secondary | ICD-10-CM | POA: Diagnosis not present

## 2023-07-18 DIAGNOSIS — F1320 Sedative, hypnotic or anxiolytic dependence, uncomplicated: Secondary | ICD-10-CM | POA: Diagnosis not present

## 2023-07-18 DIAGNOSIS — M069 Rheumatoid arthritis, unspecified: Secondary | ICD-10-CM | POA: Diagnosis not present

## 2023-07-18 DIAGNOSIS — S82252D Displaced comminuted fracture of shaft of left tibia, subsequent encounter for closed fracture with routine healing: Secondary | ICD-10-CM | POA: Diagnosis not present

## 2023-07-18 DIAGNOSIS — S72462D Displaced supracondylar fracture with intracondylar extension of lower end of left femur, subsequent encounter for closed fracture with routine healing: Secondary | ICD-10-CM | POA: Diagnosis not present

## 2023-07-25 DIAGNOSIS — I10 Essential (primary) hypertension: Secondary | ICD-10-CM | POA: Diagnosis not present

## 2023-07-25 DIAGNOSIS — E782 Mixed hyperlipidemia: Secondary | ICD-10-CM | POA: Diagnosis not present

## 2023-07-26 DIAGNOSIS — E538 Deficiency of other specified B group vitamins: Secondary | ICD-10-CM | POA: Diagnosis not present

## 2023-07-26 DIAGNOSIS — F1320 Sedative, hypnotic or anxiolytic dependence, uncomplicated: Secondary | ICD-10-CM | POA: Diagnosis not present

## 2023-07-26 DIAGNOSIS — I700 Atherosclerosis of aorta: Secondary | ICD-10-CM | POA: Diagnosis not present

## 2023-07-26 DIAGNOSIS — S82252D Displaced comminuted fracture of shaft of left tibia, subsequent encounter for closed fracture with routine healing: Secondary | ICD-10-CM | POA: Diagnosis not present

## 2023-07-26 DIAGNOSIS — S72462D Displaced supracondylar fracture with intracondylar extension of lower end of left femur, subsequent encounter for closed fracture with routine healing: Secondary | ICD-10-CM | POA: Diagnosis not present

## 2023-07-26 DIAGNOSIS — I10 Essential (primary) hypertension: Secondary | ICD-10-CM | POA: Diagnosis not present

## 2023-07-26 DIAGNOSIS — M069 Rheumatoid arthritis, unspecified: Secondary | ICD-10-CM | POA: Diagnosis not present

## 2023-07-26 DIAGNOSIS — D649 Anemia, unspecified: Secondary | ICD-10-CM | POA: Diagnosis not present

## 2023-07-26 DIAGNOSIS — K5790 Diverticulosis of intestine, part unspecified, without perforation or abscess without bleeding: Secondary | ICD-10-CM | POA: Diagnosis not present

## 2023-08-01 DIAGNOSIS — S72462D Displaced supracondylar fracture with intracondylar extension of lower end of left femur, subsequent encounter for closed fracture with routine healing: Secondary | ICD-10-CM | POA: Diagnosis not present

## 2023-08-01 DIAGNOSIS — E538 Deficiency of other specified B group vitamins: Secondary | ICD-10-CM | POA: Diagnosis not present

## 2023-08-01 DIAGNOSIS — D649 Anemia, unspecified: Secondary | ICD-10-CM | POA: Diagnosis not present

## 2023-08-01 DIAGNOSIS — I700 Atherosclerosis of aorta: Secondary | ICD-10-CM | POA: Diagnosis not present

## 2023-08-01 DIAGNOSIS — S82252D Displaced comminuted fracture of shaft of left tibia, subsequent encounter for closed fracture with routine healing: Secondary | ICD-10-CM | POA: Diagnosis not present

## 2023-08-01 DIAGNOSIS — I10 Essential (primary) hypertension: Secondary | ICD-10-CM | POA: Diagnosis not present

## 2023-08-01 DIAGNOSIS — F1320 Sedative, hypnotic or anxiolytic dependence, uncomplicated: Secondary | ICD-10-CM | POA: Diagnosis not present

## 2023-08-01 DIAGNOSIS — M069 Rheumatoid arthritis, unspecified: Secondary | ICD-10-CM | POA: Diagnosis not present

## 2023-08-01 DIAGNOSIS — K5790 Diverticulosis of intestine, part unspecified, without perforation or abscess without bleeding: Secondary | ICD-10-CM | POA: Diagnosis not present

## 2023-08-10 DIAGNOSIS — K5790 Diverticulosis of intestine, part unspecified, without perforation or abscess without bleeding: Secondary | ICD-10-CM | POA: Diagnosis not present

## 2023-08-10 DIAGNOSIS — I700 Atherosclerosis of aorta: Secondary | ICD-10-CM | POA: Diagnosis not present

## 2023-08-10 DIAGNOSIS — S82252D Displaced comminuted fracture of shaft of left tibia, subsequent encounter for closed fracture with routine healing: Secondary | ICD-10-CM | POA: Diagnosis not present

## 2023-08-10 DIAGNOSIS — D649 Anemia, unspecified: Secondary | ICD-10-CM | POA: Diagnosis not present

## 2023-08-10 DIAGNOSIS — E538 Deficiency of other specified B group vitamins: Secondary | ICD-10-CM | POA: Diagnosis not present

## 2023-08-10 DIAGNOSIS — F1320 Sedative, hypnotic or anxiolytic dependence, uncomplicated: Secondary | ICD-10-CM | POA: Diagnosis not present

## 2023-08-10 DIAGNOSIS — M069 Rheumatoid arthritis, unspecified: Secondary | ICD-10-CM | POA: Diagnosis not present

## 2023-08-10 DIAGNOSIS — S72462D Displaced supracondylar fracture with intracondylar extension of lower end of left femur, subsequent encounter for closed fracture with routine healing: Secondary | ICD-10-CM | POA: Diagnosis not present

## 2023-08-10 DIAGNOSIS — I10 Essential (primary) hypertension: Secondary | ICD-10-CM | POA: Diagnosis not present

## 2023-08-15 ENCOUNTER — Ambulatory Visit: Payer: Medicare HMO | Admitting: Nurse Practitioner

## 2023-08-21 DIAGNOSIS — M1712 Unilateral primary osteoarthritis, left knee: Secondary | ICD-10-CM | POA: Diagnosis not present

## 2023-08-21 DIAGNOSIS — R6889 Other general symptoms and signs: Secondary | ICD-10-CM | POA: Diagnosis not present

## 2023-08-22 ENCOUNTER — Ambulatory Visit (INDEPENDENT_AMBULATORY_CARE_PROVIDER_SITE_OTHER): Payer: Medicare HMO | Admitting: Nurse Practitioner

## 2023-08-22 ENCOUNTER — Other Ambulatory Visit: Payer: Self-pay | Admitting: Internal Medicine

## 2023-08-22 ENCOUNTER — Encounter: Payer: Self-pay | Admitting: Nurse Practitioner

## 2023-08-22 VITALS — BP 120/60 | HR 60 | Temp 97.8°F | Ht 62.25 in | Wt 114.6 lb

## 2023-08-22 DIAGNOSIS — R7309 Other abnormal glucose: Secondary | ICD-10-CM | POA: Diagnosis not present

## 2023-08-22 DIAGNOSIS — I700 Atherosclerosis of aorta: Secondary | ICD-10-CM

## 2023-08-22 DIAGNOSIS — I10 Essential (primary) hypertension: Secondary | ICD-10-CM

## 2023-08-22 DIAGNOSIS — R6889 Other general symptoms and signs: Secondary | ICD-10-CM

## 2023-08-22 DIAGNOSIS — Z23 Encounter for immunization: Secondary | ICD-10-CM | POA: Diagnosis not present

## 2023-08-22 DIAGNOSIS — E782 Mixed hyperlipidemia: Secondary | ICD-10-CM

## 2023-08-22 DIAGNOSIS — G250 Essential tremor: Secondary | ICD-10-CM

## 2023-08-22 DIAGNOSIS — Z0001 Encounter for general adult medical examination with abnormal findings: Secondary | ICD-10-CM | POA: Diagnosis not present

## 2023-08-22 DIAGNOSIS — Z0000 Encounter for general adult medical examination without abnormal findings: Secondary | ICD-10-CM

## 2023-08-22 MED ORDER — PROPRANOLOL HCL 80 MG PO TABS
ORAL_TABLET | ORAL | 3 refills | Status: DC
Start: 2023-08-22 — End: 2023-08-24

## 2023-08-22 NOTE — Progress Notes (Signed)
MEDICARE ANNUAL WELLNESS VISIT AND FOLLOW UP  Assessment:   Dana [name] was seen today for follow-up and medicare wellness.  Diagnoses and all orders for this visit:  Encounter for Medicare annual wellness exam Due annually  Health maintenance reviewed Healthily lifestyle goals set   Atherosclerosis of aorta (HCC) Per CT 2021 Control blood pressure, cholesterol, glucose, increase exercise.   Essential hypertension Discussed DASH (Dietary Approaches to Stop Hypertension) DASH diet is lower in sodium than a typical American diet. Cut back on foods that are high in saturated fat, cholesterol, and trans fats. Eat more whole-grain foods, fish, poultry, and nuts Remain active and exercise as tolerated daily.  Monitor BP at home-Call if greater than 130/80.   Hyperlipidemia, mixed Discussed lifestyle modifications. Recommended diet heavy in fruits and veggies, omega 3's. Decrease consumption of animal meats, cheeses, and dairy products. Remain active and exercise as tolerated. Continue to monitor. Check lipids/TSH  Hypothyroidism, unspecified type Slightly elevated - no medication changes for now. Continue Levothyroxine. Reminded to take on an empty stomach 30-82mins before food.  Stop any Biotin Supplement 48-72 hours before next TSH level to reduce the risk of falsely low TSH levels. Continue to monitor.    Tremor, essential Taking Inderol and valium for this Doing well Managing, no complications at this time. Continue to monitor  Other abnormal glucose; hx of prediabetes Education: Reviewed 'ABCs' of diabetes management  Discussed goals to be met and/or maintained include A1C (<7) Blood pressure (<130/80) Cholesterol (LDL <70) Continue Eye Exam yearly  Continue Dental Exam Q6 mo Discussed dietary recommendations Discussed Physical Activity recommendations  Osteopenia of left hip Pursue a combination of weight-bearing exercises and strength training. Patients with  severe mobility impairment should be referred for physical therapy. Advised on fall prevention measures including proper lighting in all rooms, removal of area rugs and floor clutter, use of walking devices as deemed appropriate, avoidance of uneven walking surfaces. Smoking cessation and moderate alcohol consumption if applicable Consume 800 to 1000 IU of vitamin D daily with a goal vitamin D serum value of 30 ng/mL or higher. Aim for 1000 to 1200 mg of elemental calcium daily through supplements and/or dietary sources.  Vitamin D deficiency Continue supplimentation Check levels  Non-seasonal allergic rhinitis, unspecified trigger Continue Singulair Avoid triggers No recent flare. Controlled.  Medication management All medications discussed and reviewed in full. All questions and concerns regarding medications addressed.    Rheumatoid Arthritis (HCC) [facility] rheum is managing; doing well on current meds  Hiscory of SCC/BCC Dr. Dorita Sciara office follows  New skin change to left wrist - follow up with Dr. Oneta Rack for removal.  Anemia Normocytic Continue to monitor  Skin Change Schedule f/u apt with Dr. Oneta Rack for removal.  Review of lab work 06/2023 stable - obtain NOV.  Notify office for further evaluation and treatment, questions or concerns if any reported s/s fail to improve.   The patient was advised to call back or seek an in-person evaluation if any symptoms worsen or if the condition fails to improve as anticipated.   Further disposition pending results of labs. Discussed med's effects and SE's.    I discussed the assessment and treatment plan with the patient. The patient was provided an opportunity to ask questions and all were answered. The patient agreed with the plan and demonstrated an understanding of the instructions.  Discussed med's effects and SE's. Screening labs and tests as requested with regular follow-up as recommended.  I provided 40 minutes of  face-to-face  time during this encounter including counseling, chart review, and critical decision making was preformed.  Today's Plan of Care is based on a patient-centered health care approach known as shared decision making - the decisions, tests and treatments allow for patient preferences and values to be balanced with clinical evidence.    Future Appointments  Date Time Provider Department Center  09/08/2023 11:30 AM Lucky Cowboy, MD [facility] None  07/04/2024  2:00 PM Lucky Cowboy, MD [facility] None  08/21/2024  2:00 PM Adela Glimpse, NP [facility] None     Plan:   During the course of the visit the patient was educated and counseled about appropriate screening and preventive services including:   Pneumococcal vaccine  Prevnar 13 Influenza vaccine Td vaccine Screening electrocardiogram Bone densitometry screening Colorectal cancer screening Diabetes screening Glaucoma screening Nutrition counseling  Advanced directives: requested   Subjective:  Dana [name] is a 82 y.o. female who presents for Medicare Annual Wellness Visit and 3 month follow up for HTN, HLD, Hypothyroidism, Pre-Diabetes, Vitamin D Defciency, RA, and hereditary essential tremor.  Overall she reports feeling well today.  She has no new concerns.   She lives alone.  Her son and daughter visit occasionally.  She had a fall in 11/2022 and fractured saft of left femur.  Has been discharged by Dr. Jena Gauss.  Now uses a walker.  Getting around well.    She was hospitalized in Jan 2022 after she fell at home and was found several hours later by family; had rhabdo with CPK ^^ 1716 which has since resolved following IVF; incidentally + covid 19 without clear sx; questionable UTI that was treated. Had R knee contusion with suspected hemarthrosis, was recommended conservative interventions only and to follow up with Dr. Fara Chute. Did complete PT in home for recovery, reports doing well since, continues  with home balance exercises. Uses cane and denies falls since. Daughter checks on her regularly.   She is following with Dr. Terri Piedra for cryo for precancerous lesions and is concerned today for a growth to her right wrist (see photo below).  States she had one on the right forearm that fell off.    Patient diagnosed with RA in 2001 and stable on MTX with folica acid and plaquenil and follows with [facility].  She has an essential tremor and taking propanolol for this 80mg  TID, also valium PRN. Has not worsened over time.   BMI is Body mass index is 20.79 kg/m., she has not been working on diet and exercise, she does PT/strengthening exercises regularly since fall.   Wt Readings from Last 3 Encounters:  08/22/23 114 lb 9.6 oz (52 kg)  06/27/23 115 lb 3.2 oz (52.3 kg)  01/09/23 120 lb (54.4 kg)   She has had elevated blood pressure since 1998. Her blood pressure has been controlled at home, today their BP is BP: 120/60 She does not workout. She denies chest pain, shortness of breath, dizziness.   She has aortic atherosclerosis per CT 08/2020.   She is not on cholesterol medication (she stopped taking pravastatin 40 mg daily 1 year ago) and denies myalgias. Her cholesterol is at goal. The cholesterol last visit was:   Lab Results  Component Value Date   CHOL 158 06/27/2023   HDL 62 06/27/2023   LDLCALC 79 06/27/2023   TRIG 88 06/27/2023   CHOLHDL 2.5 06/27/2023   She has had brief intermittent hx prediabetes since 2012 (5.8% in 2015). She has been working on diet and exercise for glucose  management, and denies hyperglycemia, hypoglycemia , increased appetite, nausea, paresthesia of the feet, polydipsia and polyuria. Last A1C in the office was:  Lab Results  Component Value Date   HGBA1C 5.2 06/27/2023   She is on thyroid medication. Last TSH WNL Lab Results  Component Value Date   TSH 6.95 (H) 06/27/2023   Last GFR: Lab Results  Component Value Date   GFRNONAA >60 01/13/2023    Patient is on Vitamin D supplement.   Lab Results  Component Value Date   VD25OH 63 06/27/2023     She has chronic normocytic anemia; she is on B12 and folic acid supplements;  Lab Results  Component Value Date   WBC 4.2 06/27/2023   HGB 12.5 06/27/2023   HCT 37.6 06/27/2023   MCV 96.7 06/27/2023   PLT 232 06/27/2023      Medication Review: Current Outpatient Medications on File Prior to Visit  Medication Sig Dispense Refill   acetaminophen (TYLENOL) 325 MG tablet Take 2 tablets (650 mg total) by mouth every 6 (six) hours as needed. (Patient taking differently: Take 650 mg by mouth every 6 (six) hours as needed for mild pain.) 30 tablet 0   alendronate (FOSAMAX) 70 MG tablet Take 70 mg by mouth once a week. Take with a full glass of water on an empty stomach.     Cholecalciferol (VITAMIN D) 125 MCG (5000 UT) CAPS Take 10,000 Units by mouth every evening.     Cyanocobalamin (VITAMIN B 12 PO) Place 1,000 mcg under the tongue every evening.     diazepam (VALIUM) 5 MG tablet Take  1 table5t  3 x / day for Hereditary / Familial Tremor 270 tablet 1   dicyclomine (BENTYL) 20 MG tablet TAKE 1 TABLET THREE TIMES DAILY BEFORE MEALS FOR CRAMPING, BLOATING, NAUSEA OR DIARRHEA (Patient taking differently: Take 20 mg by mouth 4 (four) times daily -  before meals and at bedtime. TAKE 1 TABLET THREE TIMES DAILY BEFORE MEALS FOR CRAMPING, BLOATING, NAUSEA OR DIARRHEA) 270 tablet 0   docusate sodium (COLACE) 100 MG capsule Take 1 capsule (100 mg total) by mouth 2 (two) times daily. 10 capsule 0   folic acid (FOLVITE) 800 MCG tablet Take 1 tablet daily. (Patient taking differently: Take 800 mcg by mouth daily.) 90 tablet 1   furosemide (LASIX) 40 MG tablet TAKE 1 TABLET TWICE DAILY FOR FLUID RETENTION AND ANKLE  SWELLING (Patient taking differently: Take 40 mg by mouth daily. TAKE 1 TABLET TWICE DAILY FOR FLUID RETENTION AND ANKLE  SWELLING) 180 tablet 10   hydrocortisone 2.5 % ointment Apply topically  2 (two) times daily.     hydroxychloroquine (PLAQUENIL) 200 MG tablet Take 200 mg by mouth See admin instructions.     levothyroxine (SYNTHROID) 100 MCG tablet Take 1/2 tablet on Mon and Friday and 1 whole tablet  other 5 days &  on an empty stomach with only water for 30 minutes & no Antacid meds, Calcium or Magnesium for 4 hours & avoid Biotin 90 tablet 1   methotrexate (RHEUMATREX) 2.5 MG tablet Take 4 tablets (10 mg total) by mouth See admin instructions. Take 4 tablets (10 mg) by mouth twice weekly - Tuesday and Wednesday 4 tablet 0   montelukast (SINGULAIR) 10 MG tablet TAKE 1 TABLET EVERY DAY (Patient taking differently: Take 10 mg by mouth at bedtime. TAKE 1 TABLET EVERY DAY) 90 tablet 3   Multiple Vitamin (MULTIVITAMIN WITH MINERALS) TABS tablet Take 1 tablet by mouth every evening.  ondansetron (ZOFRAN) 8 MG tablet Take 1/2 to 1 tablet 3 x /day (every 6 hours) as Needed for Nausea / Vomiting 30 tablet 3   polyethylene glycol (MIRALAX / GLYCOLAX) 17 g packet Take 17 g by mouth daily. 14 each 0   propranolol (INDERAL) 80 MG tablet TAKE 1 TABLET TWICE DAILY FOR BLOOD PRESSURE AND TREMORS 180 tablet 3   terbinafine (LAMISIL) 250 MG tablet Take 1 tablet Daily for Toenail Fungus 90 tablet 0   triamcinolone (KENALOG) 0.025 % ointment Apply 1 Application topically 2 (two) times daily. 30 g 0   No current facility-administered medications on file prior to visit.    Allergies  Allergen Reactions   Codeine Nausea Only   Mysoline [Primidone] Nausea Only and Other (See Comments)    Over-sedated   Sulfa Antibiotics Itching    Current Problems (verified) Patient Active Problem List   Diagnosis Date Noted   Disp supracondylar fx with intracondylar extension of distal femur (HCC) 01/09/2023   Left tibial fracture 01/08/2023   Elevated troponin 01/08/2023   History of basal cell carcinoma (BCC) 05/06/2021   History of SCC (squamous cell carcinoma) of skin 05/06/2021   Right knee pain  12/10/2020   Diverticulosis 09/03/2020   Aortic atherosclerosis (HCC) by CT scan 08/2020 08/17/2020   Osteopenia 12/05/2018   FHx: heart disease 09/03/2018   Former smoker (quit 1985) 09/03/2018   BMI 21.0-21.9, adult 05/30/2018   Generalized OA 09/02/2015   Vitamin D deficiency 01/06/2014   Hypertension    Rheumatoid arthritis (HCC)    Hereditary essential tremor    Hypothyroidism    Migraines    Hyperlipidemia, mixed    Chronic anemia     Screening Tests Immunization History  Administered Date(s) Administered   DT (Pediatric) 02/05/2015   Influenza Split 09/12/2013   Influenza, High Dose Seasonal PF 10/28/2014, 08/18/2016, 11/22/2018, 12/09/2019, 10/06/2020, 11/03/2021, 10/27/2022, 08/22/2023   Pneumococcal Conjugate-13 05/12/2015   Pneumococcal-Unspecified 11/21/2006   Td 11/22/2003   Tdap 06/26/2021   Varicella 06/23/2011   Zoster, Live 06/23/2011     Preventative care: Last colonoscopy: 2013, normal except mild diverticulosis of sigmoid; DONE Last mammogram: 08/2019  - refuses further  Last pap smear/pelvic exam: Hysterectomy   DEXA: 08/2018 L fem neck T-1.9 - defers further work up  Prior vaccinations: TD or Tdap: 2016  Influenza: 08/2023 Pneumococcal: 2008 Prevnar13: 2016 Shingles/Zostavax: 2017 declines shingrix  Covid 19: declines   Names of Other Physician/Practitioners you currently use: 1. [facility] here for primary care 2. Eye Exam, Dr. Charlotte Sanes every six months, has upcoming 06/2023 3. Dentist, several years ago, overdue, reminded to scheduel  Patient Care Team: Lucky Cowboy, MD as PCP - General (Internal Medicine) Tivis Ringer, MD as Referring Physician (Internal Medicine) Charlett Nose, [facility] (Inactive) as Pharmacist (Pharmacist)  SURGICAL HISTORY She  has a past surgical history that includes Abdominal hysterectomy; Tubal ligation; Cataract extraction, bilateral; Cholecystectomy (08/31/11);  Colonoscopy; and ORIF femur fracture (Left, 01/09/2023). FAMILY HISTORY Her family history includes Diabetes in her mother and sister; Heart attack (age of onset: 4) in her son; Heart disease in her father, maternal aunt, maternal uncle, and mother. SOCIAL HISTORY She  reports that she quit smoking about 38 years ago. Her smoking use included cigarettes. She has never used smokeless tobacco. She reports that she does not drink alcohol and does not use drugs.   MEDICARE WELLNESS OBJECTIVES: Physical activity: Current Exercise Habits: Home exercise routine Cardiac risk factors:   Depression/mood  screen:      08/22/2023    5:15 PM  Depression screen PHQ 2/9  Decreased Interest 0  Down, Depressed, Hopeless 0  PHQ - 2 Score 0    ADLs:     08/22/2023    5:15 PM 01/08/2023    9:46 PM  In your present state of health, do you have any difficulty performing the following activities:  Hearing? 0 0  Vision? 0 0  Difficulty concentrating or making decisions? 0 0  Walking or climbing stairs? 1 1  Dressing or bathing? 0 1  Doing errands, shopping? 0 1     Cognitive Testing  Alert? Yes  Normal Appearance?Yes  Oriented to person? Yes  Place? Yes   Time? Yes  Recall of three objects?  Yes  Can perform simple calculations? Yes  Displays appropriate judgment?Yes  Can read the correct time from a watch face?Yes  EOL planning:    Review of Systems  Constitutional:  Negative for malaise/fatigue and weight loss.  HENT:  Negative for hearing loss and tinnitus.   Eyes:  Negative for blurred vision and double vision.  Respiratory:  Negative for cough, sputum production, shortness of breath and wheezing.   Cardiovascular:  Negative for chest pain, palpitations, orthopnea, claudication, leg swelling and PND.  Gastrointestinal:  Negative for abdominal pain, blood in stool, constipation, diarrhea, heartburn, melena, nausea and vomiting.  Genitourinary: Negative.   Musculoskeletal:  Negative for  falls, joint pain and myalgias.  Skin:  Negative for rash.  Neurological:  Positive for tremors (stable). Negative for dizziness, tingling, sensory change, weakness and headaches.  Endo/Heme/Allergies:  Negative for polydipsia.  Psychiatric/Behavioral: Negative.  Negative for depression, memory loss, substance abuse and suicidal ideas. The patient is not nervous/anxious and does not have insomnia.   All other systems reviewed and are negative.    Objective:     Today's Vitals   08/22/23 1421  BP: 120/60  Pulse: 60  Temp: 97.8 F (36.6 C)  SpO2: 99%  Weight: 114 lb 9.6 oz (52 kg)  Height: 5' 2.25" (1.581 m)    Body mass index is 20.79 kg/m.  General appearance: alert, no distress, WD/WN, female HEENT: normocephalic, sclerae anicteric, TMs pearly, nares patent, no discharge or erythema, pharynx normal Oral cavity: MMM, no lesions Neck: supple, no lymphadenopathy, no thyromegaly, no masses Heart: RRR, normal S1, S2, no murmurs Lungs: CTA bilaterally, no wheezes, rhonchi, or rales Abdomen: +bs, soft, non tender, non distended, no masses, no hepatomegaly, no splenomegaly Musculoskeletal: nontender, no swelling, no obvious deformity Extremities: no edema, no cyanosis, no clubbing Pulses: 2+ symmetric, upper and lower extremities, normal cap refill Neurological: alert, oriented x 3, CN2-12 intact, strength normal upper extremities and lower extremities, sensation normal throughout, DTRs 2+ throughout, no cerebellar signs, gait slow steady, subtle head and vocal tremor Psychiatric: normal affect, behavior normal, pleasant  Skin:  See photo   Medicare Attestation I have personally reviewed: The patient's medical and social history Their use of alcohol, tobacco or illicit drugs Their current medications and supplements The patient's functional ability including ADLs,fall risks, home safety risks, cognitive, and hearing and visual impairment Diet and physical activities Evidence  for depression or mood disorders  The patient's weight, height, BMI, and visual acuity have been recorded in the chart.  I have made referrals, counseling, and provided education to the patient based on review of the above and I have provided the patient with a written personalized care plan for preventive services.  Adela Glimpse, NP   08/22/2023

## 2023-08-22 NOTE — Patient Instructions (Signed)

## 2023-08-24 ENCOUNTER — Other Ambulatory Visit: Payer: Self-pay

## 2023-08-24 ENCOUNTER — Other Ambulatory Visit: Payer: Self-pay | Admitting: Internal Medicine

## 2023-08-24 DIAGNOSIS — I10 Essential (primary) hypertension: Secondary | ICD-10-CM

## 2023-08-24 DIAGNOSIS — G250 Essential tremor: Secondary | ICD-10-CM

## 2023-08-24 MED ORDER — PROPRANOLOL HCL 80 MG PO TABS: ORAL_TABLET | ORAL | 3 refills | Status: DC

## 2023-08-24 MED ORDER — PROPRANOLOL HCL 80 MG PO TABS: ORAL_TABLET | ORAL | 3 refills | Status: AC

## 2023-09-01 DIAGNOSIS — Z08 Encounter for follow-up examination after completed treatment for malignant neoplasm: Secondary | ICD-10-CM | POA: Diagnosis not present

## 2023-09-01 DIAGNOSIS — C44529 Squamous cell carcinoma of skin of other part of trunk: Secondary | ICD-10-CM | POA: Diagnosis not present

## 2023-09-01 DIAGNOSIS — D485 Neoplasm of uncertain behavior of skin: Secondary | ICD-10-CM | POA: Diagnosis not present

## 2023-09-01 DIAGNOSIS — R229 Localized swelling, mass and lump, unspecified: Secondary | ICD-10-CM | POA: Diagnosis not present

## 2023-09-01 DIAGNOSIS — C44622 Squamous cell carcinoma of skin of right upper limb, including shoulder: Secondary | ICD-10-CM | POA: Diagnosis not present

## 2023-09-01 DIAGNOSIS — M1712 Unilateral primary osteoarthritis, left knee: Secondary | ICD-10-CM | POA: Diagnosis not present

## 2023-09-01 DIAGNOSIS — D0439 Carcinoma in situ of skin of other parts of face: Secondary | ICD-10-CM | POA: Diagnosis not present

## 2023-09-01 DIAGNOSIS — Z86007 Personal history of in-situ neoplasm of skin: Secondary | ICD-10-CM | POA: Diagnosis not present

## 2023-09-08 ENCOUNTER — Encounter: Payer: Medicare HMO | Admitting: Internal Medicine

## 2023-09-08 DIAGNOSIS — M1712 Unilateral primary osteoarthritis, left knee: Secondary | ICD-10-CM | POA: Diagnosis not present

## 2023-09-12 DIAGNOSIS — I10 Essential (primary) hypertension: Secondary | ICD-10-CM | POA: Diagnosis not present

## 2023-09-12 DIAGNOSIS — E782 Mixed hyperlipidemia: Secondary | ICD-10-CM | POA: Diagnosis not present

## 2023-09-15 DIAGNOSIS — C44622 Squamous cell carcinoma of skin of right upper limb, including shoulder: Secondary | ICD-10-CM | POA: Diagnosis not present

## 2023-09-29 DIAGNOSIS — D044 Carcinoma in situ of skin of scalp and neck: Secondary | ICD-10-CM | POA: Diagnosis not present

## 2023-09-29 DIAGNOSIS — D485 Neoplasm of uncertain behavior of skin: Secondary | ICD-10-CM | POA: Diagnosis not present

## 2023-09-29 DIAGNOSIS — C44529 Squamous cell carcinoma of skin of other part of trunk: Secondary | ICD-10-CM | POA: Diagnosis not present

## 2023-10-02 DIAGNOSIS — M1712 Unilateral primary osteoarthritis, left knee: Secondary | ICD-10-CM | POA: Diagnosis not present

## 2023-10-11 DIAGNOSIS — Z961 Presence of intraocular lens: Secondary | ICD-10-CM | POA: Diagnosis not present

## 2023-10-11 DIAGNOSIS — Z79899 Other long term (current) drug therapy: Secondary | ICD-10-CM | POA: Diagnosis not present

## 2023-10-11 DIAGNOSIS — R6889 Other general symptoms and signs: Secondary | ICD-10-CM | POA: Diagnosis not present

## 2023-10-11 DIAGNOSIS — H26492 Other secondary cataract, left eye: Secondary | ICD-10-CM | POA: Diagnosis not present

## 2023-11-02 DIAGNOSIS — D492 Neoplasm of unspecified behavior of bone, soft tissue, and skin: Secondary | ICD-10-CM | POA: Diagnosis not present

## 2023-11-02 DIAGNOSIS — R6889 Other general symptoms and signs: Secondary | ICD-10-CM | POA: Diagnosis not present

## 2023-11-02 DIAGNOSIS — L570 Actinic keratosis: Secondary | ICD-10-CM | POA: Diagnosis not present

## 2023-11-02 DIAGNOSIS — C44729 Squamous cell carcinoma of skin of left lower limb, including hip: Secondary | ICD-10-CM | POA: Diagnosis not present

## 2023-11-07 ENCOUNTER — Other Ambulatory Visit: Payer: Self-pay | Admitting: Internal Medicine

## 2023-11-07 DIAGNOSIS — K580 Irritable bowel syndrome with diarrhea: Secondary | ICD-10-CM

## 2023-11-07 DIAGNOSIS — B351 Tinea unguium: Secondary | ICD-10-CM

## 2023-11-07 DIAGNOSIS — J3089 Other allergic rhinitis: Secondary | ICD-10-CM

## 2023-11-07 DIAGNOSIS — E039 Hypothyroidism, unspecified: Secondary | ICD-10-CM

## 2023-11-07 DIAGNOSIS — R609 Edema, unspecified: Secondary | ICD-10-CM

## 2023-11-07 MED ORDER — LEVOTHYROXINE SODIUM 100 MCG PO TABS
ORAL_TABLET | ORAL | 3 refills | Status: DC
Start: 2023-11-07 — End: 2023-11-20

## 2023-11-07 MED ORDER — TERBINAFINE HCL 250 MG PO TABS: ORAL_TABLET | ORAL | 0 refills | Status: AC

## 2023-11-07 MED ORDER — FUROSEMIDE 40 MG PO TABS: ORAL_TABLET | ORAL | Status: AC

## 2023-11-07 MED ORDER — MONTELUKAST SODIUM 10 MG PO TABS
ORAL_TABLET | ORAL | 3 refills | Status: DC
Start: 2023-11-07 — End: 2023-12-12

## 2023-11-07 MED ORDER — DICYCLOMINE HCL 20 MG PO TABS: ORAL_TABLET | ORAL | Status: AC

## 2023-11-07 MED ORDER — FOLIC ACID 800 MCG PO TABS: ORAL_TABLET | ORAL | 3 refills | Status: AC

## 2023-11-20 ENCOUNTER — Other Ambulatory Visit: Payer: Self-pay | Admitting: Internal Medicine

## 2023-11-20 DIAGNOSIS — E039 Hypothyroidism, unspecified: Secondary | ICD-10-CM

## 2023-11-20 MED ORDER — LEVOTHYROXINE SODIUM 100 MCG PO TABS: ORAL_TABLET | ORAL | 3 refills | Status: DC

## 2023-12-01 DIAGNOSIS — M0579 Rheumatoid arthritis with rheumatoid factor of multiple sites without organ or systems involvement: Secondary | ICD-10-CM | POA: Diagnosis not present

## 2023-12-01 DIAGNOSIS — M1991 Primary osteoarthritis, unspecified site: Secondary | ICD-10-CM | POA: Diagnosis not present

## 2023-12-01 DIAGNOSIS — M810 Age-related osteoporosis without current pathological fracture: Secondary | ICD-10-CM | POA: Diagnosis not present

## 2023-12-01 DIAGNOSIS — Z6820 Body mass index (BMI) 20.0-20.9, adult: Secondary | ICD-10-CM | POA: Diagnosis not present

## 2023-12-12 ENCOUNTER — Other Ambulatory Visit: Payer: Self-pay | Admitting: Internal Medicine

## 2023-12-12 ENCOUNTER — Other Ambulatory Visit: Payer: Self-pay | Admitting: Nurse Practitioner

## 2023-12-12 DIAGNOSIS — G250 Essential tremor: Secondary | ICD-10-CM

## 2023-12-12 DIAGNOSIS — J3089 Other allergic rhinitis: Secondary | ICD-10-CM

## 2023-12-18 ENCOUNTER — Other Ambulatory Visit: Payer: Self-pay | Admitting: Nurse Practitioner

## 2023-12-18 DIAGNOSIS — G250 Essential tremor: Secondary | ICD-10-CM

## 2023-12-18 MED ORDER — DIAZEPAM 5 MG PO TABS: ORAL_TABLET | ORAL | 0 refills | Status: DC

## 2024-01-25 ENCOUNTER — Encounter: Payer: Self-pay | Admitting: *Deleted

## 2024-02-22 IMAGING — DX DG FOOT COMPLETE 3+V*L*
3 series · 3 of 3 positions shown · non-contrast
Comparison: Radiograph 04/07/2018.

CLINICAL DATA: Fall, foot pain and swelling

EXAM:
LEFT FOOT - COMPLETE 3+ VIEW

[foot ap]
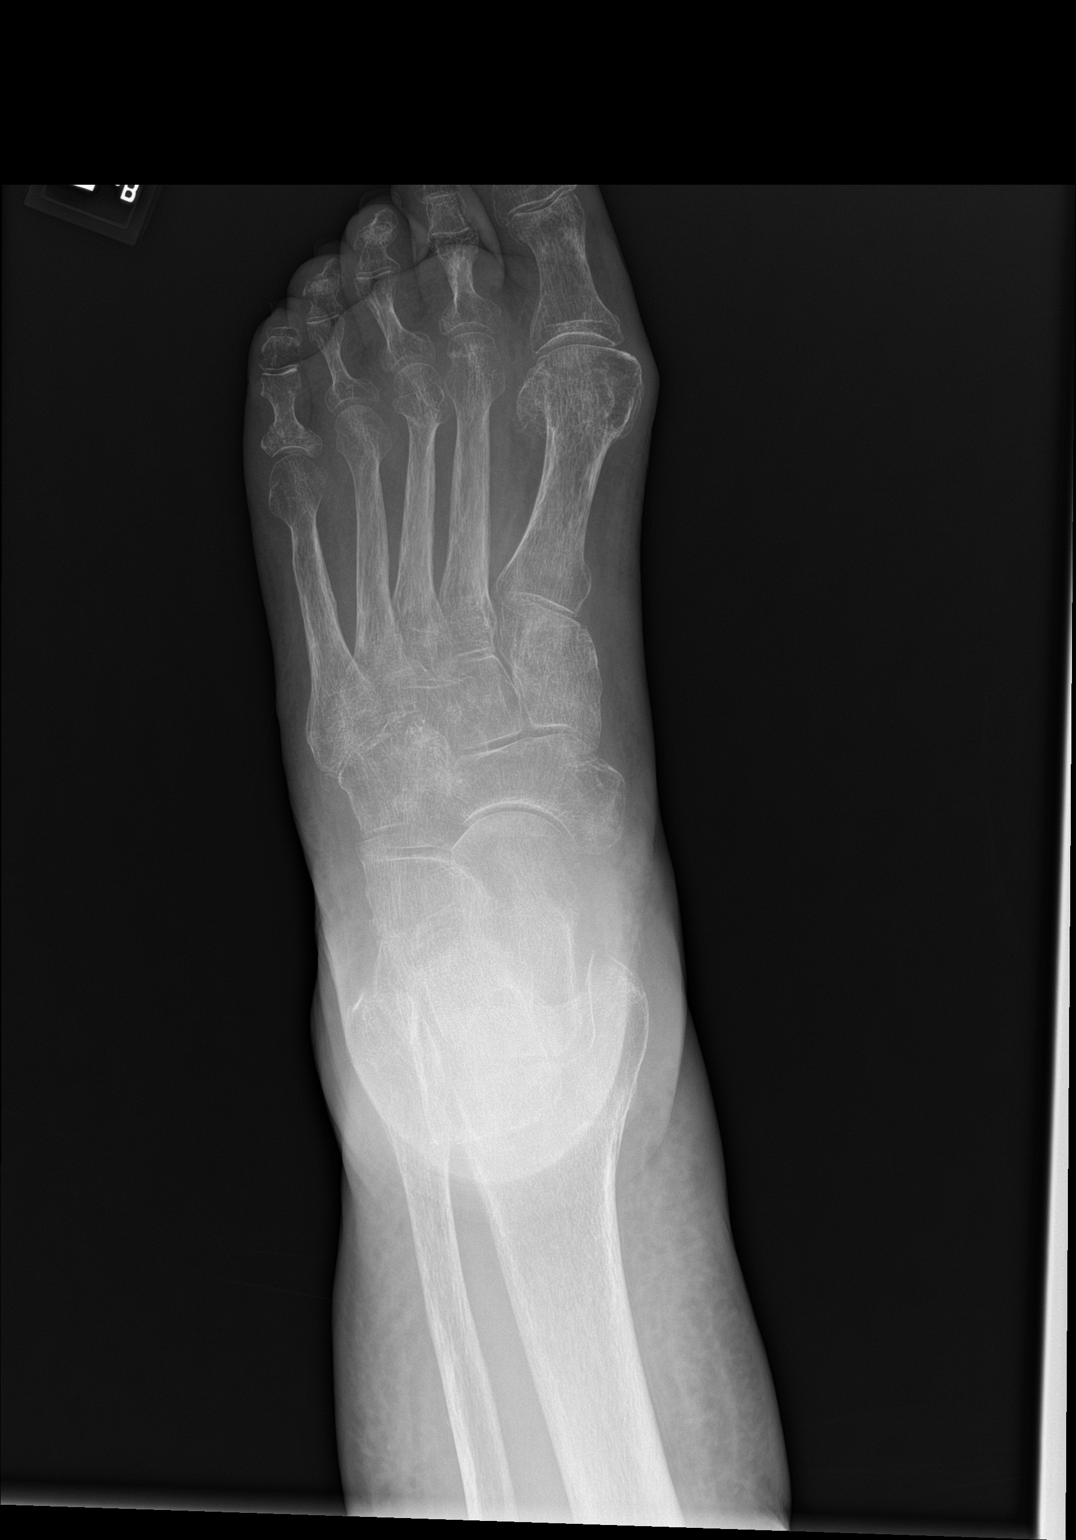

[foot obl]
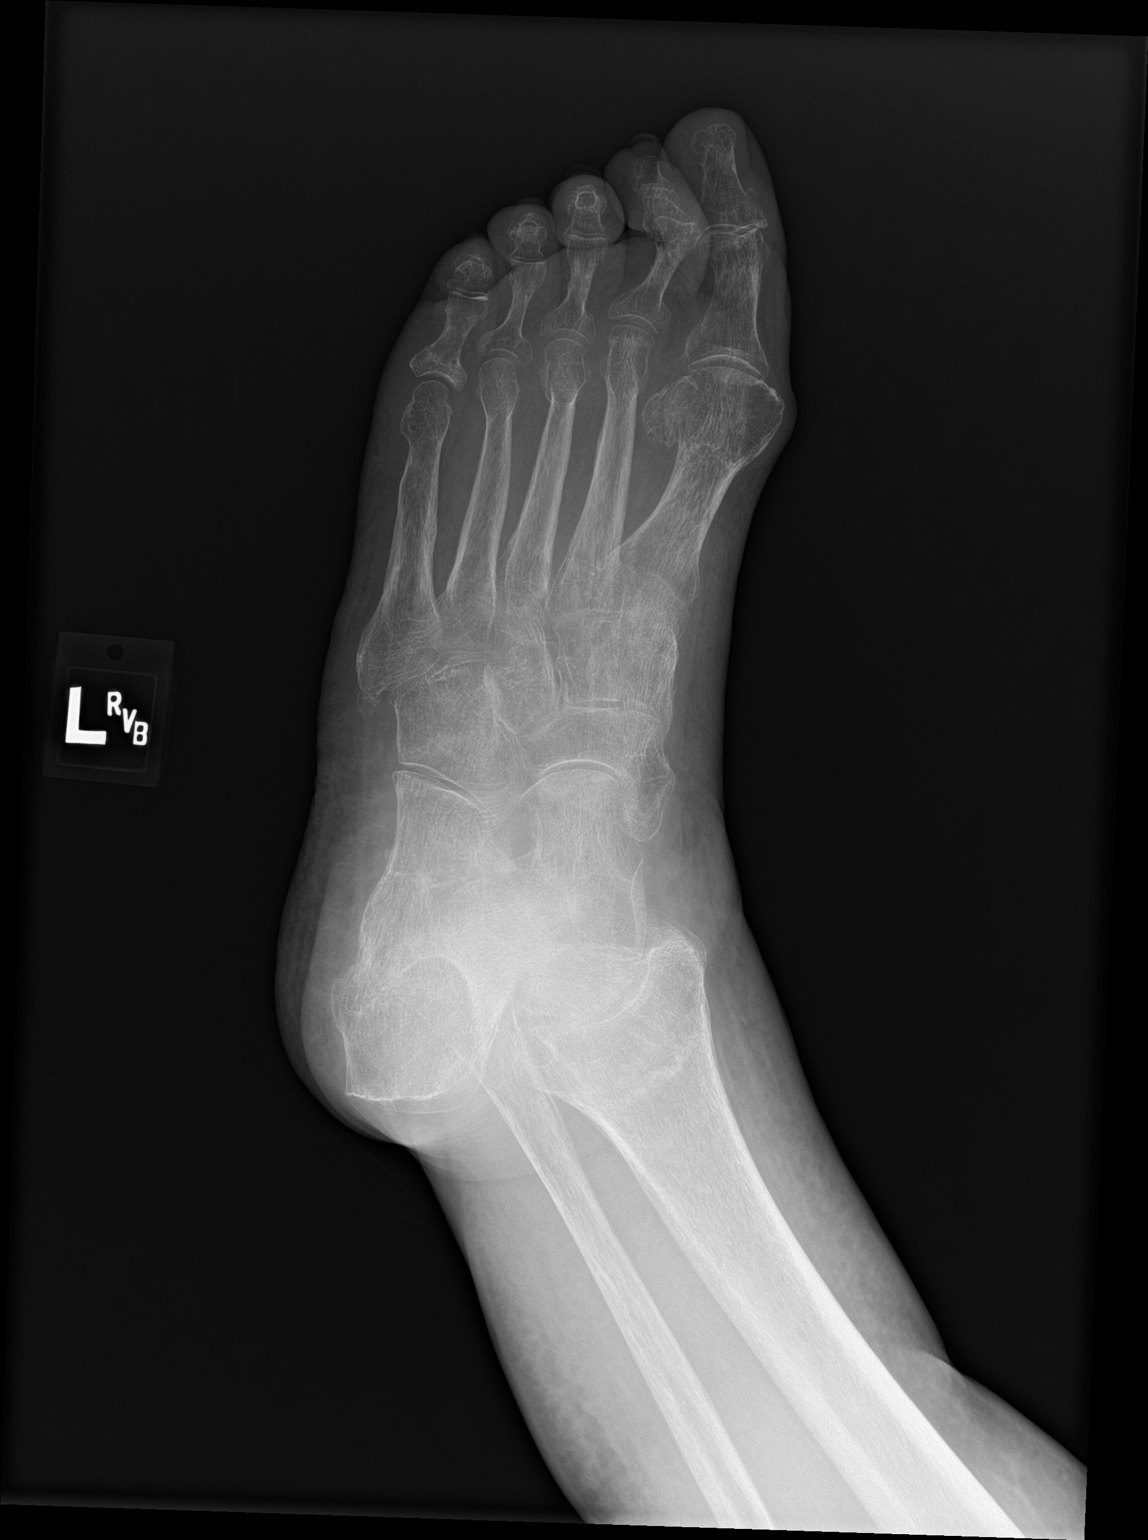

[foot lat]
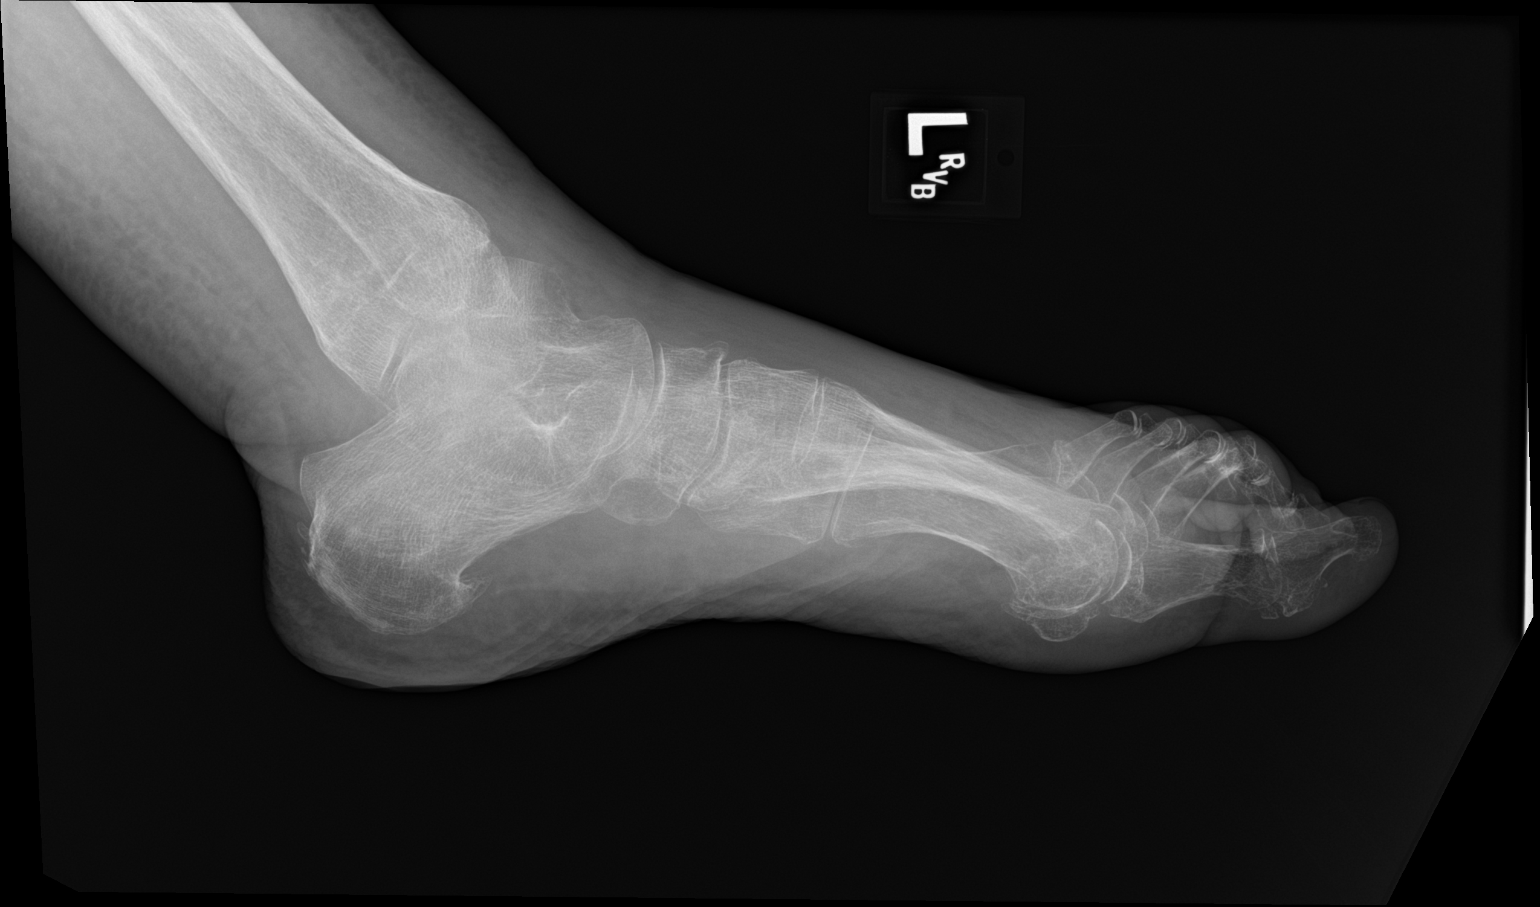

[3 of 3 positions shown; findings below may reference images not displayed]

FINDINGS: There is diffuse soft tissue swelling of the left foot and ankle.
There is a nondisplaced fracture at the base of the fifth digit
proximal phalanx. There is an old fracture deformity of the fifth
metatarsal shaft. Plantar and dorsal calcaneal spurring. Dorsal
midfoot spurring. Osteopenia.
IMPRESSION: Nondisplaced fracture of the base of the fifth digit proximal
phalanx. Diffuse foot and ankle soft tissue swelling.

Old fracture deformity of the fifth metatarsal shaft.

## 2024-02-22 IMAGING — CT CT HEAD W/O CM
3 series · 16 of 47 positions shown, 19 images · non-contrast
Comparison: Head CT 12/09/2020.

CLINICAL DATA: 80-year-old female with history of trauma from a
fall out of the bath tub. Hematoma in the center of her forehead.



[Series 2: head wo · axial · 0.47mm/px · z∈[+329,+459]mm · 10 of 32 slices shown, 13 images]
[im 3/32  brain]
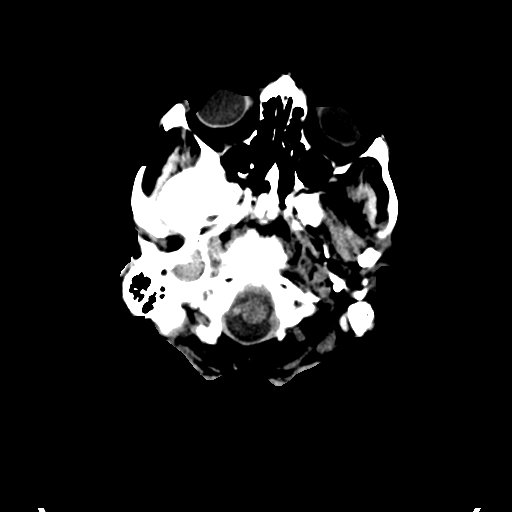
[im 3/32  bone]
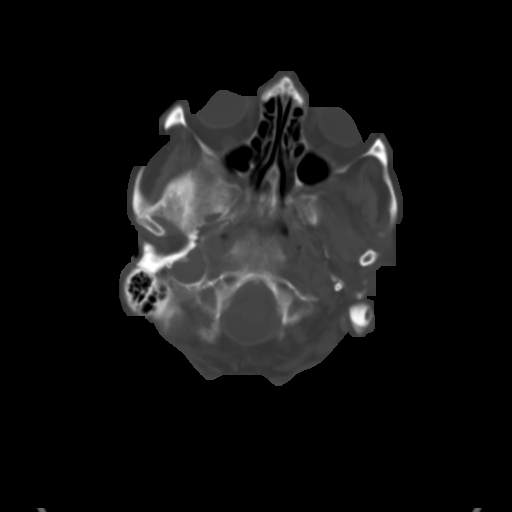
[im 6/32  brain]
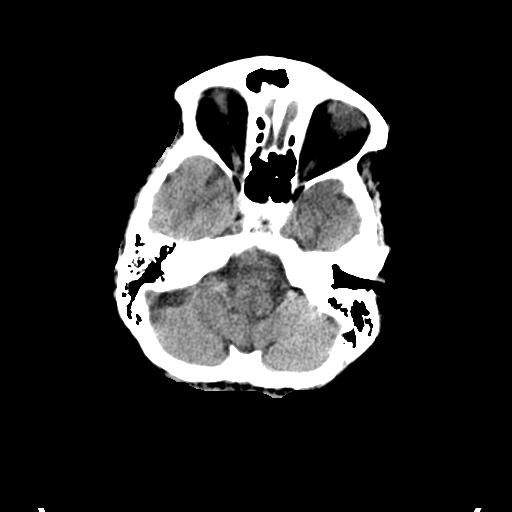
[im 9/32  brain]
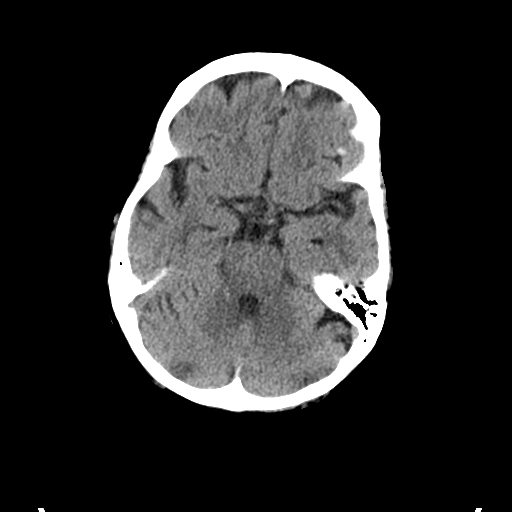
[im 11/32  brain]
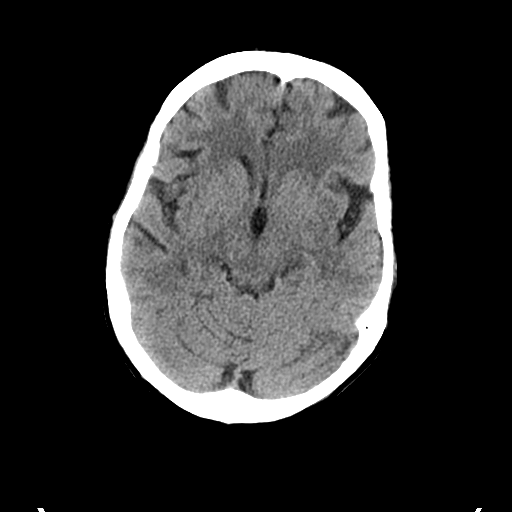
[im 14/32  brain]
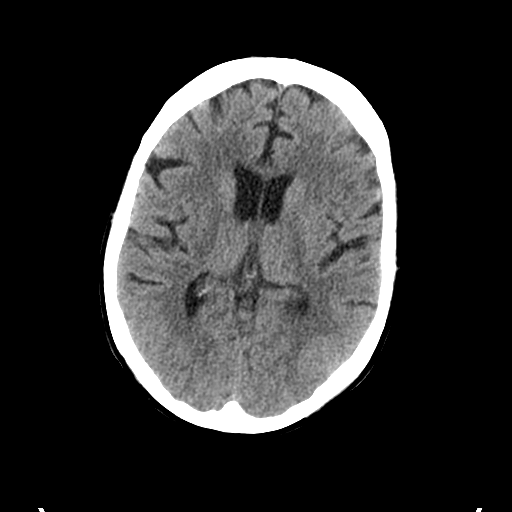
[im 14/32  bone]
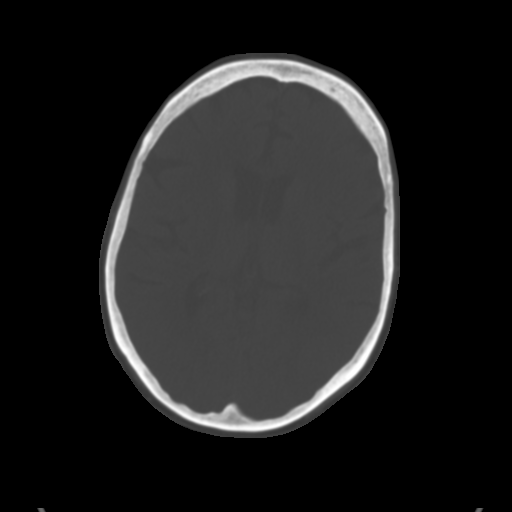
[im 18/32  brain]
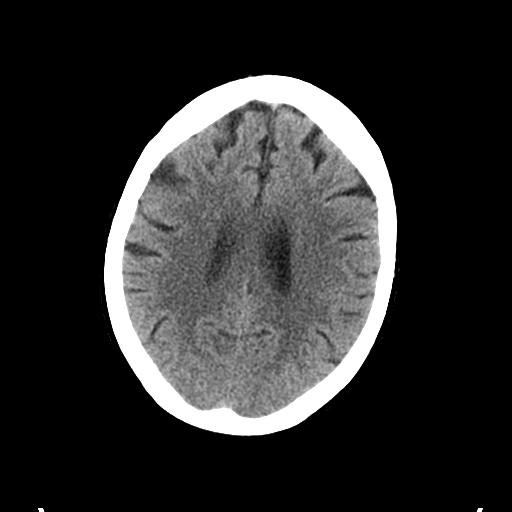
[im 21/32  brain]
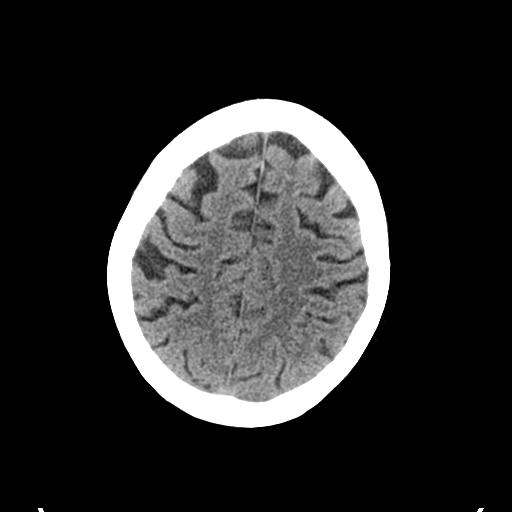
[im 24/32  brain]
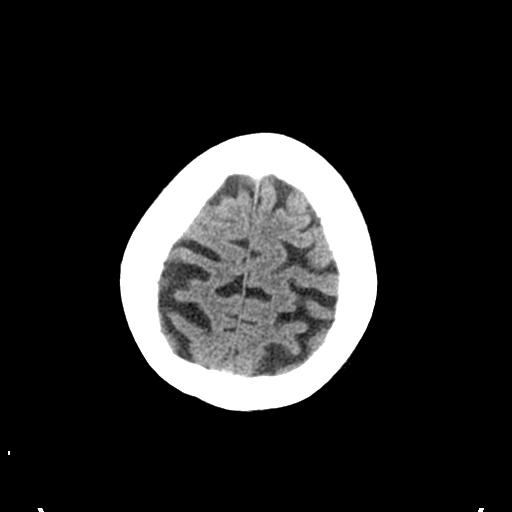
[im 26/32  brain]
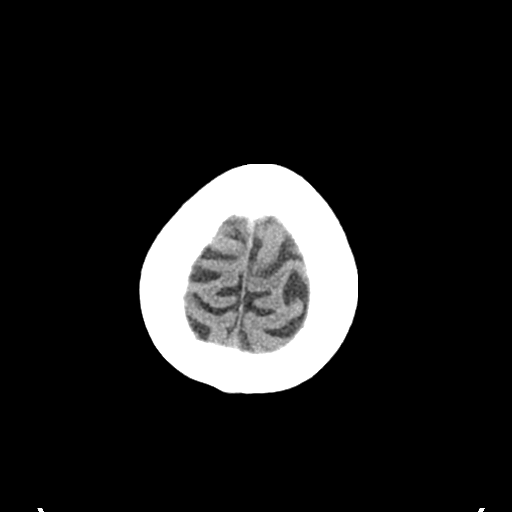
[im 26/32  bone]
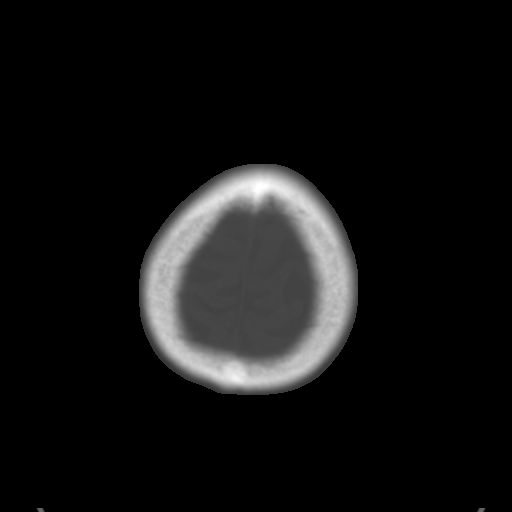
[im 29/32  brain]
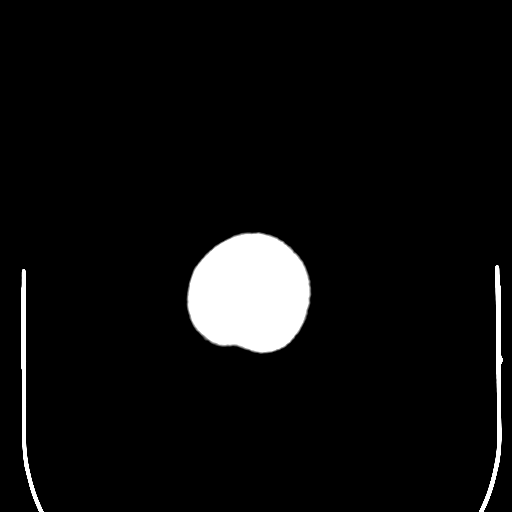

[Series 5: coronal soft tissue · coronal · 0.32mm/px · 3 of 66 slices shown]
[im 22/66  brain]
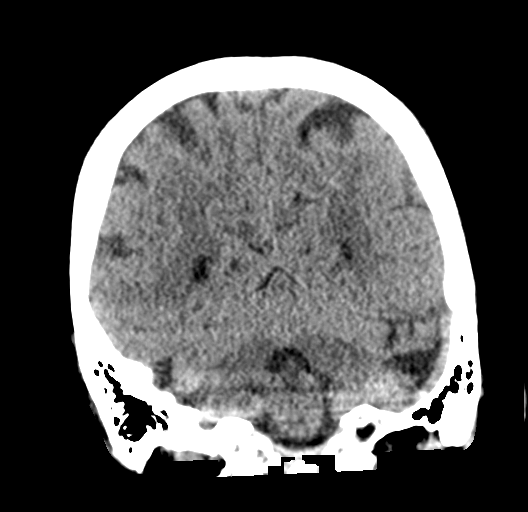
[im 29/66  brain]
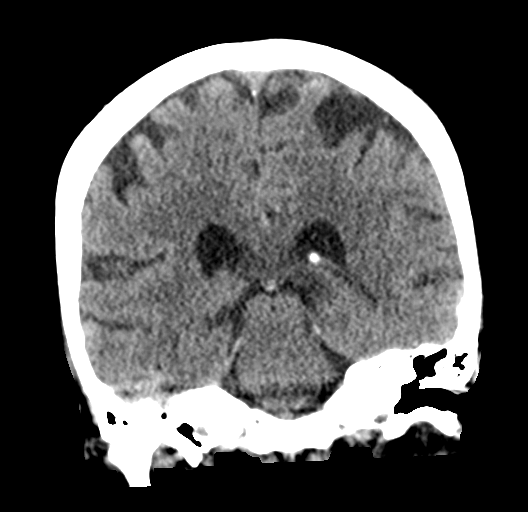
[im 37/66  brain]
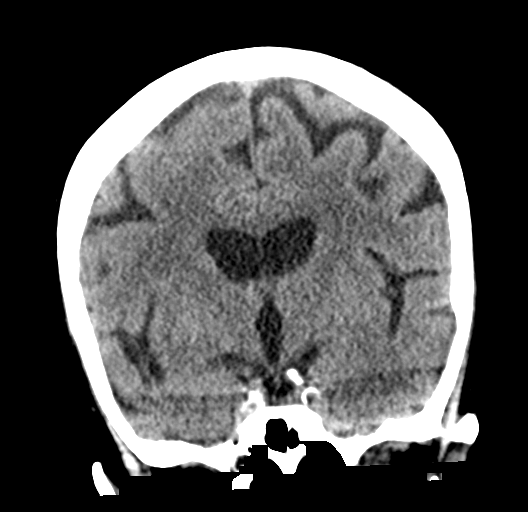

[Series 6: sagittal soft tissue · sagittal · 0.32mm/px · 3 of 56 slices shown]
[im 19/56  brain]
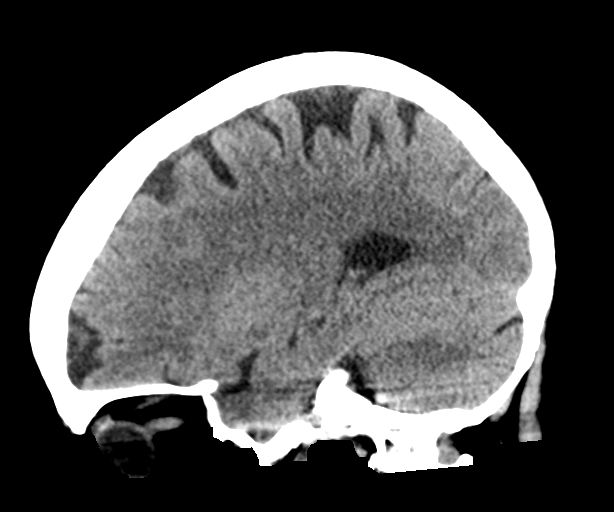
[im 28/56  brain]
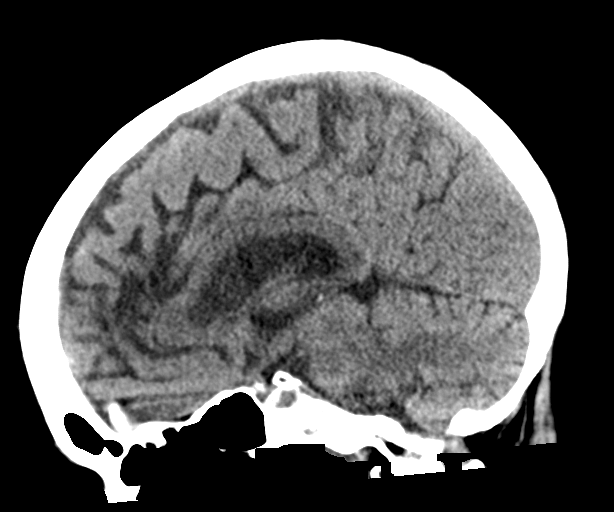
[im 37/56  brain]
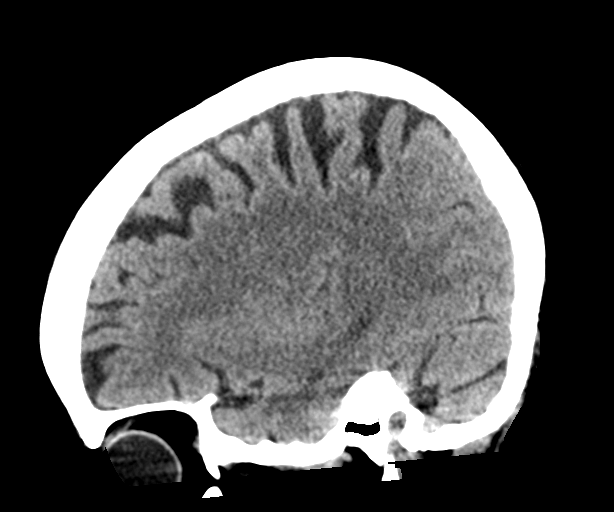

[16 of 47 positions shown; findings below may reference images not displayed]

FINDINGS: Brain: Very mild cerebral atrophy. Patchy areas of mild decreased
attenuation are noted throughout the deep and periventricular white
matter of the cerebral hemispheres bilaterally, compatible with mild
chronic microvascular ischemic disease. No evidence of acute
infarction, hemorrhage, hydrocephalus, extra-axial collection or
mass lesion/mass effect.

Vascular: No hyperdense vessel or unexpected calcification.

Skull: Normal. Negative for fracture or focal lesion.

Sinuses/Orbits: No acute finding.

Other: None.
IMPRESSION: 1. No acute intracranial abnormalities.
2. Mild cerebral atrophy with mild chronic microvascular ischemic
changes in the cerebral white matter, as above.

## 2024-03-13 ENCOUNTER — Other Ambulatory Visit: Payer: Self-pay

## 2024-03-13 DIAGNOSIS — G250 Essential tremor: Secondary | ICD-10-CM

## 2024-03-13 MED ORDER — DIAZEPAM 5 MG PO TABS: ORAL_TABLET | ORAL | 0 refills | Status: DC

## 2024-03-13 NOTE — Telephone Encounter (Signed)
 Former patient of Dr. Cassondra Cliff needing immediate refill on Diazepam  for her tremors. Advised to send to [facility] office for assistance.   Copied from CRM [id number]. Topic: Clinical - Red Word Triage >> Mar 13, 2024  3:18 PM Baldemar Lev wrote: Red Word that prompted transfer to Nurse Triage: Severe pain, broken leg. Needs Rx

## 2024-03-19 ENCOUNTER — Ambulatory Visit: Payer: Self-pay

## 2024-03-19 NOTE — Telephone Encounter (Signed)
 Per chart review, order for diazepam  (Valium ) 5 MG tablet was signed on 03/13/2024 by Jarold Merlin FNP. 90 pills TID for hereditary/Familial Tremor. Medication wasn't transmitted to pharmacy. Under outpatient medication Detail lists Class:Print  Patient is needing medication to be sent to [facility] [address], [place] - [address] [place] [address] Phone: [phone]  Fax: [phone]    Copied from CRM [id number]. Topic: Clinical - Red Word Triage >> Mar 19, 2024  4:35 PM Everette C wrote: [facility] that prompted transfer to Nurse Triage: The patient would like to speak with a member of clinical staff when possible about their prescription for diazepam  (VALIUM ) 5 MG tablet [[id number]] and continuing the medication regularly   The patient is currently out and uncertain of what to do next regarding the prescription Reason for Disposition  [1] Prescription prescribed recently is not at pharmacy AND [2] triager has access to patient's EMR AND [3] prescription is recorded in the EMR  Answer Assessment - Initial Assessment Questions 1. NAME of MEDICINE: "What medicine(s) are you calling about?"     Diazepam  2. QUESTION: "What is your question?" (e.g., double dose of medicine, side effect)     Patient calling about order for diazepam  3. PRESCRIBER: "Who prescribed the medicine?" Reason: if prescribed by specialist, call should be referred to that group.     Abigail Abler NP 4. SYMPTOMS: "Do you have any symptoms?" If Yes, ask: "What symptoms are you having?"  "How bad are the symptoms (e.g., mild, moderate, severe)     Takes medication for tremors.  Protocols used: Medication Question Call-A-AH

## 2024-03-26 ENCOUNTER — Other Ambulatory Visit: Payer: Self-pay

## 2024-03-26 DIAGNOSIS — G250 Essential tremor: Secondary | ICD-10-CM

## 2024-03-26 NOTE — Telephone Encounter (Signed)
 Copied from CRM [id number]. Topic: Clinical - Prescription Issue >> Mar 26, 2024  3:41 PM Dana [name] wrote: Reason for CRM: Patient called in regarding diazepam  (Valium ) 5 MG tablet , stated she has been without it for a week, and her new patient appointment with Dr.Thompson isnt until next month , wanted to know if there was anyway she could gert a refill until then

## 2024-03-27 ENCOUNTER — Other Ambulatory Visit: Payer: Self-pay | Admitting: Family

## 2024-03-27 DIAGNOSIS — G250 Essential tremor: Secondary | ICD-10-CM

## 2024-03-27 MED ORDER — DIAZEPAM 5 MG PO TABS: ORAL_TABLET | ORAL | 0 refills | Status: DC

## 2024-04-09 ENCOUNTER — Other Ambulatory Visit: Payer: Self-pay | Admitting: Family Medicine

## 2024-04-09 DIAGNOSIS — E039 Hypothyroidism, unspecified: Secondary | ICD-10-CM

## 2024-04-09 DIAGNOSIS — J3089 Other allergic rhinitis: Secondary | ICD-10-CM

## 2024-04-09 MED ORDER — LEVOTHYROXINE SODIUM 100 MCG PO TABS: ORAL_TABLET | ORAL | 3 refills | Status: AC

## 2024-04-09 MED ORDER — MONTELUKAST SODIUM 10 MG PO TABS: 10.0000 mg | ORAL_TABLET | Freq: Every day | ORAL | 3 refills | Status: DC

## 2024-04-22 ENCOUNTER — Encounter: Payer: Medicare HMO | Admitting: Internal Medicine

## 2024-04-25 ENCOUNTER — Ambulatory Visit (INDEPENDENT_AMBULATORY_CARE_PROVIDER_SITE_OTHER): Admitting: Family Medicine

## 2024-04-25 VITALS — BP 154/68 | HR 88 | Temp 98.3°F | Ht 62.5 in | Wt 114.4 lb

## 2024-04-25 DIAGNOSIS — B351 Tinea unguium: Secondary | ICD-10-CM | POA: Diagnosis not present

## 2024-04-25 DIAGNOSIS — G250 Essential tremor: Secondary | ICD-10-CM

## 2024-04-25 DIAGNOSIS — Z7689 Persons encountering health services in other specified circumstances: Secondary | ICD-10-CM

## 2024-04-25 DIAGNOSIS — M069 Rheumatoid arthritis, unspecified: Secondary | ICD-10-CM | POA: Diagnosis not present

## 2024-04-25 DIAGNOSIS — J301 Allergic rhinitis due to pollen: Secondary | ICD-10-CM

## 2024-04-25 DIAGNOSIS — R159 Full incontinence of feces: Secondary | ICD-10-CM

## 2024-04-25 DIAGNOSIS — M2041 Other hammer toe(s) (acquired), right foot: Secondary | ICD-10-CM

## 2024-04-25 DIAGNOSIS — R152 Fecal urgency: Secondary | ICD-10-CM

## 2024-04-25 DIAGNOSIS — M2042 Other hammer toe(s) (acquired), left foot: Secondary | ICD-10-CM

## 2024-04-25 MED ORDER — LOPERAMIDE HCL 2 MG PO TABS: 2.0000 mg | ORAL_TABLET | Freq: Four times a day (QID) | ORAL | 0 refills | Status: AC | PRN

## 2024-04-25 NOTE — Patient Instructions (Addendum)
  VISIT SUMMARY: Today, we discussed the management of your essential tremors and gastrointestinal issues, including fecal incontinence. We also reviewed your history of rheumatoid arthritis, hypertension, allergies, athlete's foot, and hammer toe. We talked about your current medications and potential changes to improve your symptoms. Additionally, we addressed your concerns about transitioning to a new doctor and the importance of continuity of care.  YOUR PLAN: -ESSENTIAL TREMOR: Essential tremor is a nervous system disorder that causes involuntary and rhythmic shaking. You should continue taking propranolol  80 mg twice daily. We discussed the risks of using diazepam , including dependence and increased fall risk. We also talked about the possibility of trying topiramate as an alternative treatment. A referral to a neurologist for further evaluation and management of your tremors is recommended.  -FECAL INCONTINENCE: Fecal incontinence is the inability to control bowel movements, causing stool to leak unexpectedly. We recommend using loperamide , starting with 2-4 tablets in the morning and adjusting as needed. You should also consider taking probiotics, such as yogurt, to improve gut health. Limit the use of Pepto Bismol and take loperamide  before outings to manage symptoms.  -RHEUMATOID ARTHRITIS: Rheumatoid arthritis is an autoimmune disorder that causes joint inflammation and pain. Your condition appears to be fairly well controlled, and we will continue with your current management plan.  -HYPERTENSION: Hypertension is high blood pressure. Your blood pressure was slightly elevated today, possibly due to anxiety. We will reassess your blood pressure at your next visit. Continue taking propranolol  as prescribed.  -ALLERGIC RHINITIS: Allergic rhinitis is an allergic reaction that causes sneezing, congestion, and a runny nose. Please use your nasal spray consistently to manage your allergy  symptoms.  -Nail Fungus: Athlete's foot is a fungal infection that affects the skin on the feet. Since your symptoms are not currently bothersome, you may consider discontinuing terbinafine .  -HAMMER TOE: Hammer toe is a deformity that causes your toe to bend or curl downward. We will refer you to a podiatrist for nail care and management of your hammer toe.  INSTRUCTIONS: Please schedule a follow-up appointment in three months. Ensure that your prescriptions are sent to your preferred pharmacy, [facility], for mail order.

## 2024-04-25 NOTE — Progress Notes (Signed)
 Assessment & Plan   Assessment/Plan:    Assessment & Plan Essential Tremor Chronic essential tremor exacerbated by anxiety and medication side effects. Managed with propranolol  80 mg twice daily and diazepam  5 mg once daily. Diazepam  is not preferred due to potential for dependence, cognitive effects, increased fall risk, and association with dementia. Previous trials of primidone were unsuccessful due to side effects. Topiramate is considered as an alternative treatment option. She has not seen a neurologist recently for tremor management. - Continue propranolol  80 mg twice daily. - Consider referral to neurologist for further evaluation and management of tremors. - Discuss potential use of topiramate as an alternative treatment for tremors. - Educate on risks of diazepam  including dependence, cognitive effects, and increased fall risk.  Fecal Incontinence Chronic fecal incontinence for approximately two years, possibly related to IBS. Previous management with dicyclomine  and Pepto Bismol. She experiences constipation with dicyclomine  use. Loperamide  is considered for management, with emphasis on adjusting dosage to prevent constipation. - Recommend loperamide  for management of fecal incontinence, starting with 2-4 tablets in the morning and adjusting as needed. - Advise use of probiotics, such as yogurt, to help with gut health. - Discuss potential side effects of Pepto Bismol and recommend limiting its use. - Educate on timing loperamide  intake before outings to manage symptoms.  Rheumatoid Arthritis Rheumatoid arthritis previously managed at [facility]. Current management details not discussed, but condition is fairly well controlled.  Hypertension Blood pressure slightly elevated at 154/68, possibly due to anxiety. Managed with propranolol , which also aids in tremor control. - Reassess blood pressure at next visit.  Allergic Rhinitis Allergies and nasal congestion. She is  supposed to use nasal spray but does not consistently use it. - Encourage consistent use of nasal spray for allergy management.  Athlete's Foot Athlete's foot treated with terbinafine  (Lamisil ) since December. Symptoms not currently bothersome. - Consider discontinuing terbinafine  if symptoms are not present.  Hammer Toe Hammer toe on the left foot causing difficulty in nail care. - Refer to podiatrist for nail care and management of hammer toe.  Goals of Care She expresses sadness over the loss of long-term provider and anxiety about transitioning to a new doctor. Discussed the importance of maintaining continuity of care and addressing current health issues.  Follow-up Transitioning care from a long-term provider and requires follow-up to assess management of current health issues. - Schedule follow-up appointment in three months. - Ensure prescriptions are sent to her preferred pharmacy, [facility], for mail order.      Medications Discontinued During This Encounter  Medication Reason   docusate sodium  (COLACE) 100 MG capsule     Return in about 3 months (around 07/26/2024) for diarhea, tremor.        Subjective:   Encounter date: 04/25/2024  Dana [name] is a 82 y.o. 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   She  has a past medical history of Anemia, Cataract, Diverticulosis (09/03/2020), Environmental allergies, Gastric ulcer,  Heartburn, Hiatal hernia, Hyperlipidemia, Hypertension, Hypothyroidism, Migraines, Prediabetes, Rash (october 2012), Rhabdomyolysis, traumatic   12/09/2020 (12/09/2020), Rheumatoid arthritis(714.0), and Tremor, essential..  She presents with chief complaint of New Patient (Initial Visit) (Establish care, talk about digestive issue, having a lot of tremors ( pt medication that she takes for it, upset stomach)) .   Discussed the use of AI scribe software for clinical note transcription with the patient, who gave verbal consent to proceed.  History of Present Illness Dana [name] is an 83 year old female with essential tremors and gastrointestinal issues who presents for management of tremors and fecal incontinence.  She experiences exacerbation of her essential tremors due to not taking her medication. Her current regimen includes propranolol  80 mg twice daily and diazepam  5 mg once daily, although it was prescribed three times a day. She has not tried other medications for tremors except for primidone, which caused sleepiness and a 'weird' feeling. She has not seen a neurologist specifically for tremors.  Her gastrointestinal issues, including indigestion and fecal incontinence, have been ongoing for about two years. She has difficulty reaching the bathroom in time, especially when away from home, which is distressing. She has tried dicyclomine , which sometimes helps with crampiness but leads to constipation after four days. She has also used loperamide  and Pepto Bismol with limited success. She takes a fiber supplement similar to Metamucil in the morning.  She has a history of rheumatoid arthritis, managed by [facility], and a femur fracture last year that resulted in surgery. The fracture did not heal properly, leaving her with a bone issue and swelling in her left knee, causing numbness in her foot and leg. She also has a history of elevated blood pressure, currently managed with  propranolol , which also helps with her tremors.  She reports a history of athlete's foot, for which she was prescribed terbinafine  (Lamisil ) since December. It is not currently bothering her, but she was informed it could affect her toenails. She also has a history of allergies, which have been particularly bad this year, and she sometimes uses a nasal spray.     ROS  Past Surgical History:  Procedure Laterality Date   ABDOMINAL HYSTERECTOMY     CATARACT EXTRACTION, BILATERAL     CHOLECYSTECTOMY  08/31/11   lap chole    COLONOSCOPY     ORIF FEMUR FRACTURE Left 01/09/2023   Procedure: OPEN REDUCTION INTERNAL FIXATION (ORIF) DISTAL FEMUR FRACTURE;  Surgeon: Laneta Pintos, MD;  Location: [facility] OR;  Service: Orthopedics;  Laterality: Left;   TUBAL LIGATION      Outpatient Medications Prior to Visit  Medication Sig Dispense Refill   acetaminophen  (TYLENOL ) 325 MG tablet Take 2 tablets (650 mg total) by mouth every 6 (six) hours as needed. (Patient taking differently: Take 650 mg by mouth every 6 (six) hours as needed for mild pain (pain score 1-3).) 30 tablet 0   alendronate (FOSAMAX) 70 MG tablet Take 70 mg by mouth once a week. Take with a full glass of water on an empty stomach.     Cholecalciferol (VITAMIN D ) 125 MCG (5000 UT) CAPS Take 10,000 Units by mouth every evening.     Cyanocobalamin  (VITAMIN B 12 PO) Place 1,000 mcg under the tongue every evening.     diazepam  (VALIUM ) 5 MG tablet Take  1 table5t  3 x / day for Hereditary / Familial Tremor 90 tablet 0   dicyclomine  (BENTYL ) 20 MG tablet TAKE 1 TABLET THREE TIMES DAILY BEFORE MEALS FOR CRAMPING, BLOATING, NAUSEA OR DIARRHEA     folic acid  (FOLVITE ) 800 MCG tablet Take 1 tablet daily. 90 tablet  3   furosemide  (LASIX ) 40 MG tablet Take 1 tablet  2 x / day for Fluid Retention  / Ankle Swelling                                  /                                                                   TAKE                                          BY                                                 MOUTH     hydrocortisone 2.5 % ointment Apply topically 2 (two) times daily.     hydroxychloroquine  (PLAQUENIL ) 200 MG tablet Take 200 mg by mouth See admin instructions.     levothyroxine  (SYNTHROID ) 100 MCG tablet Take 1 tablet 5 x / week  except ONLY 1/2 tablet 2 x /week on Mon & Fri  & take on an empty stomach with only water for 30 minutes & no Antacid meds, Calcium or Magnesium for 4 hours & avoid Biotin         /       TAKE      BY      MOUTH 78 tablet 3   methotrexate  (RHEUMATREX) 2.5 MG tablet Take 4 tablets (10 mg total) by mouth See admin instructions. Take 4 tablets (10 mg) by mouth twice weekly - Tuesday and Wednesday 4 tablet 0   montelukast  (SINGULAIR ) 10 MG tablet Take 1 tablet (10 mg total) by mouth daily. 90 tablet 3   Multiple Vitamin (MULTIVITAMIN WITH MINERALS) TABS tablet Take 1 tablet by mouth every evening.     ondansetron  (ZOFRAN ) 8 MG tablet Take 1/2 to 1 tablet 3 x /day (every 6 hours) as Needed for Nausea / Vomiting 30 tablet 3   polyethylene glycol (MIRALAX  / GLYCOLAX ) 17 g packet Take 17 g by mouth daily. 14 each 0   propranolol  (INDERAL ) 80 MG tablet TAKE 1 TABLET TWICE DAILY FOR BLOOD PRESSURE AND TREMORS 180 tablet 3   terbinafine  (LAMISIL ) 250 MG tablet Take 1 tablet Daily for 1 month & skip a month every other month  for Toenail Fungus 90 tablet 0   triamcinolone  (KENALOG ) 0.025 % ointment Apply 1 Application topically 2 (two) times daily. 30 g 0   docusate sodium  (COLACE) 100 MG capsule Take 1 capsule (100 mg total) by mouth 2 (two) times daily. 10 capsule 0   No facility-administered medications prior to visit.    Family History  Problem Relation Age of Onset   Heart disease Mother    Diabetes Mother    Heart disease Father    Heart disease Maternal Aunt        x3    Heart disease Maternal Uncle  x 4   Diabetes Sister        borderline   Heart attack Son 66       Sudden cardiac death   Colon  cancer Neg Hx     Social History   Socioeconomic History   Marital status: Widowed    Spouse name: Not on file   Number of children: Not on file   Years of education: Not on file   Highest education level: Not on file  Occupational History   Not on file  Tobacco Use   Smoking status: Former    Current packs/day: 0.00    Types: Cigarettes    Quit date: 09/18/1984    Years since quitting: 39.6   Smokeless tobacco: Never   Tobacco comments:    stopped around 25 years ago  Substance and Sexual Activity   Alcohol use: No   Drug use: No   Sexual activity: Not on file  Other Topics Concern   Not on file  Social History Narrative   Not on file   Social Drivers of Health   Financial Resource Strain: Not on file  Food Insecurity: No Food Insecurity (01/08/2023)   Hunger Vital Sign    Worried About Running Out of Food in the Last Year: Never true    Ran Out of Food in the Last Year: Never true  Transportation Needs: No Transportation Needs (01/08/2023)   PRAPARE - Administrator, Civil Service (Medical): No    Lack of Transportation (Non-Medical): No  Physical Activity: Not on file  Stress: Not on file  Social Connections: Not on file  Intimate Partner Violence: Not At Risk (01/08/2023)   Humiliation, Afraid, Rape, and Kick questionnaire    Fear of Current or Ex-Partner: No    Emotionally Abused: No    Physically Abused: No    Sexually Abused: No                                                                                                  Objective:  Physical Exam: BP (!) 154/68   Pulse 88   Temp 98.3 F (36.8 C) (Temporal)   Ht 5' 2.5" (1.588 m)   Wt 114 lb 6 oz (51.9 kg)   SpO2 98%   BMI 20.59 kg/m    Physical Exam VITALS: BP- 154/68 GENERAL: Alert, cooperative, well developed, no acute distress. HEENT: Normocephalic, normal oropharynx, moist mucous membranes. CHEST: Clear to auscultation bilaterally, no wheezes, rhonchi, or  crackles. CARDIOVASCULAR: Normal heart rate and rhythm, S1 and S2 normal without murmurs. ABDOMEN: Soft, non-tender, non-distended, without organomegaly, normal bowel sounds. EXTREMITIES: Swelling in bilateral knee and lower leg, no cyanosis . NEUROLOGICAL: Cranial nerves grossly intact, moves all extremities significant tremor in voice and extremities     No results found.  No results found for this or any previous visit (from the past 2160 hours).      Carnell Christian, MD, MS

## 2024-06-13 ENCOUNTER — Other Ambulatory Visit: Payer: Self-pay

## 2024-06-13 ENCOUNTER — Telehealth: Payer: Self-pay

## 2024-06-13 DIAGNOSIS — J3089 Other allergic rhinitis: Secondary | ICD-10-CM

## 2024-06-13 MED ORDER — MONTELUKAST SODIUM 10 MG PO TABS: 10.0000 mg | ORAL_TABLET | Freq: Every day | ORAL | 3 refills | Status: AC

## 2024-06-13 NOTE — Telephone Encounter (Signed)
 Requesting:montelukast  (SINGULAIR ) 10 MG tablet  Last Visit: 04/25/2024 Next Visit: 08/02/2024 Last Refill:04/09/2024  Please Advise

## 2024-06-19 ENCOUNTER — Other Ambulatory Visit: Payer: Self-pay | Admitting: Family

## 2024-06-19 DIAGNOSIS — G250 Essential tremor: Secondary | ICD-10-CM

## 2024-06-21 ENCOUNTER — Ambulatory Visit: Payer: Self-pay | Admitting: Podiatry

## 2024-06-21 DIAGNOSIS — M79672 Pain in left foot: Secondary | ICD-10-CM

## 2024-06-21 DIAGNOSIS — M7742 Metatarsalgia, left foot: Secondary | ICD-10-CM | POA: Diagnosis not present

## 2024-06-21 DIAGNOSIS — M7741 Metatarsalgia, right foot: Secondary | ICD-10-CM

## 2024-06-21 DIAGNOSIS — M79671 Pain in right foot: Secondary | ICD-10-CM | POA: Diagnosis not present

## 2024-06-21 DIAGNOSIS — B351 Tinea unguium: Secondary | ICD-10-CM

## 2024-06-21 NOTE — Progress Notes (Signed)
 Patient presents for evaluation and treatment of tenderness and some redness around nails feet.  Tenderness around toes with walking and wearing shoes.  Also complains of pain and tingling along the foot.  Bothers her mostly when walk and standing a lot.  Physical exam:  General appearance: Alert, pleasant, and in no acute distress.  Vascular: Pedal pulses: DP 2/4 B/L, PT 1/4 B/L.  Moderate edema lower legs bilaterally  Neurological:  Light touch intact bilaterally.  Dermatologic:  Nails thickened, disfigured, discolored 1-5 BL with subungual debris.  Redness and hypertrophic nail folds along nail folds bilaterally but no signs of drainage or infection.  Skin is thin and atrophic with no hair growth and areas of hyperpigmentation lower extremity bilaterally.  Plantar fat pad is extremely atrophic.  Musculoskeletal:  Severe hammertoes 2 through 5 bilaterally.  Lower leg bilaterally.  Tenderness on the plantar lesser metatarsal heads bilaterally.   Diagnosis: 1. Painful onychomycotic nails 1 through 5 bilaterally. 2. Pain toes 1 through 5 bilaterally. 3.  Metatarsalgia bilaterally  Plan: -New patient office visit for evaluation and management level 3. Modifier 25. -Discussed the metatarsalgia.  This is in part due to her very atrophic fat pad on the plantar aspect of the foot.  Will get her some OTC inserts for her shoes which will give her some support and additional cushioning.  Also discussed over the, nails.  Best option at this point is just keep -Debrided onychomycotic nails 1 through 5 bilaterally.  Return 3 months RFC

## 2024-06-26 ENCOUNTER — Telehealth: Payer: Self-pay | Admitting: Podiatry

## 2024-06-26 NOTE — Telephone Encounter (Signed)
 Patient called in regards to appointment on 06/21/2024. States her toe was nicked when having her nails trimmed and she states it does not seem to be healing properly. States she can't even wear shoes at this time. Wanting to know what she can do to help with the wound. Thanks

## 2024-07-01 ENCOUNTER — Other Ambulatory Visit: Payer: Self-pay

## 2024-07-01 DIAGNOSIS — G250 Essential tremor: Secondary | ICD-10-CM

## 2024-07-01 MED ORDER — DIAZEPAM 5 MG PO TABS: ORAL_TABLET | ORAL | 0 refills | Status: DC

## 2024-07-03 ENCOUNTER — Encounter: Payer: Medicare HMO | Admitting: Internal Medicine

## 2024-07-04 ENCOUNTER — Encounter: Payer: Medicare HMO | Admitting: Internal Medicine

## 2024-07-15 ENCOUNTER — Telehealth: Payer: Self-pay

## 2024-07-15 DIAGNOSIS — G250 Essential tremor: Secondary | ICD-10-CM

## 2024-07-15 NOTE — Telephone Encounter (Signed)
 Copied from CRM #[id number]. Topic: Clinical - Medication Question >> Jul 12, 2024  3:57 PM [name] wrote: Reason for CRM: Patient is calling because they have not received the diazepam  (VALIUM ) 5 MG tablet [[id number]] prescription. Patient is completely out and needs the medication.

## 2024-07-15 NOTE — Telephone Encounter (Signed)
 Pt called back and stated the pharmacy is still telling her they don't have it. I reassured her that it was sent in on 8/11. The pharmacist told her they are still waiting for a call from pcp. Please advise.

## 2024-07-15 NOTE — Telephone Encounter (Signed)
 RX for diazepam  (VALIUM ) 5 MG tablet was refilled and sent to pharmacy on 07/01/2024 listed below.    [facility] [address], [place] - [address], [place] [address] Phone: [phone]

## 2024-07-16 ENCOUNTER — Other Ambulatory Visit: Payer: Self-pay

## 2024-07-16 ENCOUNTER — Ambulatory Visit: Payer: Medicare HMO | Admitting: Nurse Practitioner

## 2024-07-16 DIAGNOSIS — G250 Essential tremor: Secondary | ICD-10-CM

## 2024-07-16 MED ORDER — DIAZEPAM 5 MG PO TABS: ORAL_TABLET | ORAL | 0 refills | Status: DC

## 2024-07-16 NOTE — Addendum Note (Signed)
 Addended by: EUGENIE ULANDA CROME on: 07/16/2024 11:48 AM   Modules accepted: Orders

## 2024-07-16 NOTE — Addendum Note (Signed)
 Addended by: SEBASTIAN RIGHTER B on: 07/16/2024 12:09 PM   Modules accepted: Orders

## 2024-07-16 NOTE — Telephone Encounter (Signed)
 Are you able to refill RX?

## 2024-07-16 NOTE — Telephone Encounter (Signed)
 Called pharmacy to clarify RX refill; pharmacy is currently closed and opens at 9am. I will call again once pharmacy opens and notify patient.

## 2024-07-16 NOTE — Telephone Encounter (Signed)
 Noted

## 2024-07-25 DIAGNOSIS — Z0100 Encounter for examination of eyes and vision without abnormal findings: Secondary | ICD-10-CM | POA: Diagnosis not present

## 2024-08-02 ENCOUNTER — Ambulatory Visit (INDEPENDENT_AMBULATORY_CARE_PROVIDER_SITE_OTHER): Admitting: Family Medicine

## 2024-08-02 ENCOUNTER — Encounter: Payer: Self-pay | Admitting: Family Medicine

## 2024-08-02 VITALS — BP 149/67 | HR 67 | Temp 97.0°F | Resp 18 | Wt 113.8 lb

## 2024-08-02 DIAGNOSIS — I10 Essential (primary) hypertension: Secondary | ICD-10-CM | POA: Diagnosis not present

## 2024-08-02 DIAGNOSIS — G629 Polyneuropathy, unspecified: Secondary | ICD-10-CM | POA: Diagnosis not present

## 2024-08-02 DIAGNOSIS — R159 Full incontinence of feces: Secondary | ICD-10-CM

## 2024-08-02 DIAGNOSIS — G250 Essential tremor: Secondary | ICD-10-CM | POA: Diagnosis not present

## 2024-08-02 DIAGNOSIS — R152 Fecal urgency: Secondary | ICD-10-CM | POA: Insufficient documentation

## 2024-08-02 NOTE — Progress Notes (Signed)
 Assessment & Plan   Assessment/Plan:    Assessment & Plan Chronic diarrhea with fecal incontinence Chronic diarrhea with fecal incontinence has persisted for a couple of years. Previous treatments with dicyclomine  and Pepto Bismol were discontinued due to constipation. Loperamide  was used with some improvement, but higher doses and timing were discussed. She reports some improvement with dietary modifications. Discussed the option of sacral nerve stimulation for poor sphincter tone, but she is currently doing better and prefers to manage with dietary changes. - Refer to Dr. Toribio Cedar at [facility] for further evaluation and management.  Essential tremor Essential tremor is being managed with propranolol  and diazepam . The tremor affects her writing and voice, and she notices when she misses a dose. Discussed the possibility of seeing a neurologist for further evaluation, but she prefers to address gastrointestinal issues first. - Continue propranolol  80 mg BID. - Continue diazepam  5 mg once daily. - Consider referral to Dr. Justina at [facility] for further evaluation after addressing gastrointestinal issues.  Numbness and immobility of lower extremities Numbness and immobility in the lower extremities have been present for a couple of years, possibly related to previous foot drop and immobility issues. Potential causes include neuropathy, sciatic nerve compression, or vitamin deficiencies. She uses a walker at home due to immobility. - Consider referral to neurology for evaluation after addressing gastrointestinal issues.  Bilateral Hammer toes All toes have some degree of Hammer toe. 5th digit on the left foot is causing discomfort and potential friction issues with footwear. She reports previous issues with toenail trimming by a podiatrist, leading to cuts and discomfort. - Advise on proper footwear to prevent friction and discomfort.  Hypertension Blood  pressure was slightly elevated, but diastolic pressure was within acceptable range. Discussed the importance of monitoring blood pressure without being overly aggressive to avoid hypotension and falls. - Monitor blood pressure. - Continue propranolol  as prescribed      There are no discontinued medications.   Return in about 3 months (around 11/01/2024) for physical (fasting labs).        Subjective:   Encounter date: 08/02/2024  Dana [name] is a 83 y.o. 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   She  has a past medical history of Anemia, Cataract, Diverticulosis (09/03/2020), Environmental allergies, Gastric ulcer, Heartburn, Hiatal hernia, Hyperlipidemia, Hypertension, Hypothyroidism, Migraines, Prediabetes, Rash (october 2012), Rhabdomyolysis, traumatic   12/09/2020 (12/09/2020), Rheumatoid arthritis(714.0), and Tremor, essential..   She presents with chief complaint of Medical Management of Chronic Issues (3 month follow up. Pt states tremor are still present and diarrhea has improved. //HM due- vaccinations ) .   Discussed the use of AI scribe software for clinical note transcription with the patient, who gave verbal consent to proceed.  History of Present Illness Dana [name] is an 83 year old female with essential tremors and gastrointestinal issues who presents for management of tremors and  fecal incontinence.  She experiences exacerbation of her essential tremors due to not taking her medication. Her current regimen includes propranolol  80 mg twice  daily and diazepam  5 mg once daily, although it was prescribed three times a day. She has not tried other medications for tremors except for primidone, which caused sleepiness and a 'weird' feeling. She has not seen a neurologist specifically for tremors.  Her gastrointestinal issues, including indigestion and fecal incontinence, have been ongoing for about two years. She has difficulty reaching the bathroom in time, especially when away from home, which is distressing. She has tried dicyclomine , which sometimes helps with crampiness but leads to constipation after four days. She has also used loperamide  and Pepto Bismol with limited success. She takes a fiber supplement similar to Metamucil in the morning.  She has a history of rheumatoid arthritis, managed by [facility], and a femur fracture last year that resulted in surgery. The fracture did not heal properly, leaving her with a bone issue and swelling in her left knee, causing numbness in her foot and leg. She also has a history of elevated blood pressure, currently managed with propranolol , which also helps with her tremors.  She reports a history of athlete's foot, for which she was prescribed terbinafine  (Lamisil ) since December. It is not currently bothering her, but she was informed it could affect her toenails. She also has a history of allergies, which have been particularly bad this year, and she sometimes uses a nasal spray.   Dana [name] is an 83 year old female with chronic benign central tremor and chronic diarrhea who presents for follow-up.  Tremor and motor dysfunction - Chronic benign central tremor affecting handwriting and voice, particularly when doses of medication are missed - Currently taking propranolol  80 mg twice daily and diazepam  5 mg once  daily for tremor control - Previous trial of pyrimidine was unsuccessful - No neurology evaluation in the past 6-7 years  Chronic diarrhea and bowel habits - Chronic diarrhea with some improvement on loperamide ; constipation occurs if loperamide  dose is too high - Symptom management includes eating light meals, especially before outings - Prior use of dicyclomine  and Pepto Bismol; dicyclomine  discontinued due to severe constipation  Lower extremity paresthesia and mobility impairment - Numbness in the legs, especially in the mornings, described as a 'weird feeling' of numbness with preserved movement - Uses a walker at home due to immobility - Does not drive and relies on children for transportation  Anxiety - Chronic anxiety managed with diazepam   Rheumatologic and endocrine conditions - Rheumatoid arthritis managed with methotrexate  and hydroxychloroquine  - Hypothyroidism - Vitamin D  supplementation  Chronic kidney disease - Chronic kidney disease stage 3B  Foot trauma and wound healing - Recent podiatry visit resulted in toenails cut too short, causing discomfort and need for foot soaks - Right pinky toe was nicked but is currently healing     ROS  Past Surgical History:  Procedure Laterality Date   ABDOMINAL HYSTERECTOMY     CATARACT EXTRACTION, BILATERAL     CHOLECYSTECTOMY  08/31/11   lap chole    COLONOSCOPY     ORIF FEMUR FRACTURE Left 01/09/2023   Procedure: OPEN REDUCTION INTERNAL FIXATION (ORIF) DISTAL FEMUR FRACTURE;  Surgeon: Kendal Franky SQUIBB, MD;  Location: [facility] OR;  Service: Orthopedics;  Laterality: Left;   TUBAL LIGATION      Outpatient Medications Prior to Visit  Medication Sig Dispense Refill   acetaminophen  (TYLENOL ) 325 MG tablet Take 2 tablets (650 mg total) by mouth every 6 (six) hours as needed. (Patient taking differently: Take 650 mg by mouth every 6 (six) hours as needed for  mild pain (pain score 1-3).) 30 tablet 0   alendronate (FOSAMAX) 70 MG  tablet Take 70 mg by mouth once a week. Take with a full glass of water on an empty stomach.     Cholecalciferol (VITAMIN D ) 125 MCG (5000 UT) CAPS Take 10,000 Units by mouth every evening.     Cyanocobalamin  (VITAMIN B 12 PO) Place 1,000 mcg under the tongue every evening.     diazepam  (VALIUM ) 5 MG tablet Take  1 table5t  3 x / day for Hereditary / Familial Tremor 90 tablet 0   dicyclomine  (BENTYL ) 20 MG tablet TAKE 1 TABLET THREE TIMES DAILY BEFORE MEALS FOR CRAMPING, BLOATING, NAUSEA OR DIARRHEA     folic acid  (FOLVITE ) 800 MCG tablet Take 1 tablet daily. 90 tablet 3   furosemide  (LASIX ) 40 MG tablet Take 1 tablet  2 x / day for Fluid Retention  / Ankle Swelling                                  /                                                                   TAKE                                         BY                                                 MOUTH     hydrocortisone 2.5 % ointment Apply topically 2 (two) times daily.     hydroxychloroquine  (PLAQUENIL ) 200 MG tablet Take 200 mg by mouth See admin instructions.     levothyroxine  (SYNTHROID ) 100 MCG tablet Take 1 tablet 5 x / week  except ONLY 1/2 tablet 2 x /week on Mon & Fri  & take on an empty stomach with only water for 30 minutes & no Antacid meds, Calcium or Magnesium for 4 hours & avoid Biotin         /       TAKE      BY      MOUTH 78 tablet 3   loperamide  (IMODIUM  A-D) 2 MG tablet Take 1 tablet (2 mg total) by mouth 4 (four) times daily as needed for diarrhea or loose stools. 30 tablet 0   methotrexate  (RHEUMATREX) 2.5 MG tablet Take 4 tablets (10 mg total) by mouth See admin instructions. Take 4 tablets (10 mg) by mouth twice weekly - Tuesday and Wednesday 4 tablet 0   montelukast  (SINGULAIR ) 10 MG tablet Take 1 tablet (10 mg total) by mouth daily. 90 tablet 3   Multiple Vitamin (MULTIVITAMIN WITH MINERALS) TABS tablet Take 1 tablet by mouth every evening.     ondansetron  (ZOFRAN ) 8 MG tablet Take 1/2 to 1 tablet 3 x /day  (every 6 hours) as Needed for Nausea / Vomiting 30 tablet 3   polyethylene glycol (MIRALAX  / GLYCOLAX )  17 g packet Take 17 g by mouth daily. 14 each 0   propranolol  (INDERAL ) 80 MG tablet TAKE 1 TABLET TWICE DAILY FOR BLOOD PRESSURE AND TREMORS 180 tablet 3   terbinafine  (LAMISIL ) 250 MG tablet Take 1 tablet Daily for 1 month & skip a month every other month  for Toenail Fungus 90 tablet 0   triamcinolone  (KENALOG ) 0.025 % ointment Apply 1 Application topically 2 (two) times daily. 30 g 0   No facility-administered medications prior to visit.    Family History  Problem Relation Age of Onset   Heart disease Mother    Diabetes Mother    Heart disease Father    Heart disease Maternal Aunt        x3    Heart disease Maternal Uncle        x 4   Diabetes Sister        borderline   Heart attack Son 93       Sudden cardiac death   Colon cancer Neg Hx     Social History   Socioeconomic History   Marital status: Widowed    Spouse name: Not on file   Number of children: Not on file   Years of education: Not on file   Highest education level: Not on file  Occupational History   Not on file  Tobacco Use   Smoking status: Former    Current packs/day: 0.00    Types: Cigarettes    Quit date: 09/18/1984    Years since quitting: 39.8   Smokeless tobacco: Never   Tobacco comments:    stopped around 25 years ago  Substance and Sexual Activity   Alcohol use: No   Drug use: No   Sexual activity: Not on file  Other Topics Concern   Not on file  Social History Narrative   Not on file   Social Drivers of Health   Financial Resource Strain: Not on file  Food Insecurity: No Food Insecurity (01/08/2023)   Hunger Vital Sign    Worried About Running Out of Food in the Last Year: Never true    Ran Out of Food in the Last Year: Never true  Transportation Needs: No Transportation Needs (01/08/2023)   PRAPARE - Administrator, Civil Service (Medical): No    Lack of  Transportation (Non-Medical): No  Physical Activity: Not on file  Stress: Not on file  Social Connections: Not on file  Intimate Partner Violence: Not At Risk (01/08/2023)   Humiliation, Afraid, Rape, and Kick questionnaire    Fear of Current or Ex-Partner: No    Emotionally Abused: No    Physically Abused: No    Sexually Abused: No                                                                                                  Objective:  Physical Exam: BP (!) 149/67 (BP Location: Left Arm, Patient Position: Sitting, Cuff Size: Normal)   Pulse 67   Temp (!) 97 F (36.1 C) (Temporal)   Resp 18   Wt 113 lb  12.8 oz (51.6 kg)   SpO2 96%   BMI 20.48 kg/m    Physical Exam VITALS: BP- 154/68 GENERAL: Alert, cooperative, well developed, no acute distress. HEENT: Normocephalic, normal oropharynx, moist mucous membranes. CHEST: Clear to auscultation bilaterally, no wheezes, rhonchi, or crackles. CARDIOVASCULAR: Normal heart rate and rhythm, S1 and S2 normal without murmurs. ABDOMEN: Soft, non-tender, non-distended, without organomegaly, normal bowel sounds. EXTREMITIES: Swelling in bilateral knee and lower leg, no cyanosis . NEUROLOGICAL: Cranial nerves grossly intact, moves all extremities significant tremor in voice and extremities   GENERAL: Alert, cooperative, well developed, no acute distress. HEENT: Normocephalic, normal oropharynx, moist mucous membranes. CHEST: Clear to auscultation bilaterally, no wheezes, rhonchi, or crackles. CARDIOVASCULAR: Normal heart rate and rhythm, S1 and S2 normal without murmurs. ABDOMEN: Soft, non-tender, non-distended, without organomegaly, normal bowel sounds. RECTAL:  EXTREMITIES: No cyanosis or edema. Right pinky toe with healing nick on the back. Left pinky toe nail appears normal. NEUROLOGICAL: Cranial nerves grossly intact, moves all extremities without gross motor or sensory deficit.     No results found.  No results found for  this or any previous visit (from the past 2160 hours).      Dana Adine Hummer, MD, MS

## 2024-08-02 NOTE — Patient Instructions (Signed)
  VISIT SUMMARY: Today, we discussed your chronic diarrhea, essential tremor, numbness in your legs, and hammer toe. We also reviewed your blood pressure and made plans for further evaluations and management.  YOUR PLAN: CHRONIC DIARRHEA WITH FECAL INCONTINENCE: You have been experiencing chronic diarrhea with some improvement using loperamide  and dietary changes. -Continue managing with dietary changes and loperamide  as needed. -Refer to Dr. Toribio Cedar at [facility] for further evaluation.  ESSENTIAL TREMOR: Your essential tremor affects your handwriting and voice, especially when you miss a dose of your medication. -Continue taking propranolol  80 mg twice daily. -Continue taking diazepam  5 mg once daily. -Consider seeing Dr. Justina at [facility] for further evaluation after addressing gastrointestinal issues.  NUMBNESS AND IMMOBILITY OF LOWER EXTREMITIES: You have numbness and immobility in your legs, which may be related to neuropathy, sciatic nerve compression, or vitamin deficiencies. -Consider seeing a neurologist for evaluation after addressing gastrointestinal issues.  HAMMER TOE OF LEFT FOOT: You have a hammer toe on your left foot causing discomfort and potential friction issues with footwear. -Wear proper footwear to prevent friction and discomfort.  HYPERTENSION: Your blood pressure was slightly elevated, but the diastolic pressure was within an acceptable range. -Monitor your blood pressure regularly.                      Contains text generated by [employer].                                 Contains text generated by [employer].

## 2024-08-14 ENCOUNTER — Ambulatory Visit: Payer: Medicare HMO | Admitting: Nurse Practitioner

## 2024-08-21 ENCOUNTER — Ambulatory Visit: Payer: Medicare HMO | Admitting: Nurse Practitioner

## 2024-09-04 DIAGNOSIS — M0579 Rheumatoid arthritis with rheumatoid factor of multiple sites without organ or systems involvement: Secondary | ICD-10-CM | POA: Diagnosis not present

## 2024-09-17 ENCOUNTER — Ambulatory Visit: Admitting: Podiatry

## 2024-09-17 ENCOUNTER — Encounter: Payer: Self-pay | Admitting: Podiatry

## 2024-09-17 DIAGNOSIS — B351 Tinea unguium: Secondary | ICD-10-CM

## 2024-09-17 DIAGNOSIS — M79672 Pain in left foot: Secondary | ICD-10-CM | POA: Diagnosis not present

## 2024-09-17 DIAGNOSIS — M79671 Pain in right foot: Secondary | ICD-10-CM

## 2024-09-17 NOTE — Progress Notes (Addendum)

## 2024-09-18 ENCOUNTER — Ambulatory Visit: Admitting: Podiatry

## 2024-09-20 ENCOUNTER — Ambulatory Visit: Admitting: Podiatry

## 2024-10-04 ENCOUNTER — Other Ambulatory Visit: Payer: Self-pay | Admitting: Family Medicine

## 2024-10-04 DIAGNOSIS — G250 Essential tremor: Secondary | ICD-10-CM

## 2024-10-04 NOTE — Telephone Encounter (Signed)
 Refill request for  Diazepam  5 mg LR 07/16/24, #90, 0 rf LOV  08/02/24 FOV   none scheduled.    Please review and advise.  Thanks.  Dm/cma

## 2024-11-04 ENCOUNTER — Ambulatory Visit

## 2024-12-24 ENCOUNTER — Ambulatory Visit

## 2025-01-27 ENCOUNTER — Encounter: Admitting: Family Medicine

## 2025-02-17 ENCOUNTER — Ambulatory Visit
